# Patient Record
Sex: Female | Born: 1955 | Race: White | Hispanic: No | Smoking: Never smoker
Health system: Southern US, Community
[De-identification: ages and names within clinical notes are randomized; demographics above are authoritative.]

## PROBLEM LIST (undated history)

## (undated) DIAGNOSIS — G4700 Insomnia, unspecified: Secondary | ICD-10-CM

## (undated) DIAGNOSIS — R350 Frequency of micturition: Secondary | ICD-10-CM

## (undated) DIAGNOSIS — D649 Anemia, unspecified: Secondary | ICD-10-CM

## (undated) DIAGNOSIS — G894 Chronic pain syndrome: Secondary | ICD-10-CM

## (undated) DIAGNOSIS — R531 Weakness: Secondary | ICD-10-CM

## (undated) DIAGNOSIS — F419 Anxiety disorder, unspecified: Secondary | ICD-10-CM

## (undated) DIAGNOSIS — R3915 Urgency of urination: Secondary | ICD-10-CM

## (undated) DIAGNOSIS — M25569 Pain in unspecified knee: Secondary | ICD-10-CM

## (undated) DIAGNOSIS — IMO0001 Reserved for inherently not codable concepts without codable children: Secondary | ICD-10-CM

## (undated) DIAGNOSIS — G2581 Restless legs syndrome: Secondary | ICD-10-CM

## (undated) DIAGNOSIS — M1990 Unspecified osteoarthritis, unspecified site: Secondary | ICD-10-CM

## (undated) DIAGNOSIS — G4730 Sleep apnea, unspecified: Secondary | ICD-10-CM

## (undated) DIAGNOSIS — K219 Gastro-esophageal reflux disease without esophagitis: Secondary | ICD-10-CM

## (undated) DIAGNOSIS — R351 Nocturia: Secondary | ICD-10-CM

## (undated) DIAGNOSIS — Z8719 Personal history of other diseases of the digestive system: Secondary | ICD-10-CM

## (undated) DIAGNOSIS — M2550 Pain in unspecified joint: Secondary | ICD-10-CM

## (undated) DIAGNOSIS — T7840XA Allergy, unspecified, initial encounter: Secondary | ICD-10-CM

## (undated) DIAGNOSIS — M797 Fibromyalgia: Secondary | ICD-10-CM

## (undated) DIAGNOSIS — F410 Panic disorder [episodic paroxysmal anxiety] without agoraphobia: Secondary | ICD-10-CM

## (undated) DIAGNOSIS — I10 Essential (primary) hypertension: Secondary | ICD-10-CM

## (undated) DIAGNOSIS — Z8669 Personal history of other diseases of the nervous system and sense organs: Secondary | ICD-10-CM

## (undated) DIAGNOSIS — F329 Major depressive disorder, single episode, unspecified: Secondary | ICD-10-CM

## (undated) DIAGNOSIS — G8929 Other chronic pain: Secondary | ICD-10-CM

## (undated) DIAGNOSIS — M549 Dorsalgia, unspecified: Secondary | ICD-10-CM

## (undated) DIAGNOSIS — F32A Depression, unspecified: Secondary | ICD-10-CM

## (undated) HISTORY — PX: OTHER SURGICAL HISTORY: SHX169

## (undated) HISTORY — DX: Major depressive disorder, single episode, unspecified: F32.9

## (undated) HISTORY — DX: Depression, unspecified: F32.A

## (undated) HISTORY — DX: Anxiety disorder, unspecified: F41.9

## (undated) HISTORY — DX: Pain in unspecified knee: M25.569

## (undated) HISTORY — DX: Panic disorder (episodic paroxysmal anxiety): F41.0

## (undated) HISTORY — DX: Gastro-esophageal reflux disease without esophagitis: K21.9

## (undated) HISTORY — DX: Essential (primary) hypertension: I10

## (undated) HISTORY — DX: Unspecified osteoarthritis, unspecified site: M19.90

## (undated) HISTORY — PX: ABDOMINAL HYSTERECTOMY: SHX81

## (undated) HISTORY — PX: COLONOSCOPY: SHX174

## (undated) HISTORY — DX: Fibromyalgia: M79.7

## (undated) HISTORY — DX: Chronic pain syndrome: G89.4

## (undated) HISTORY — PX: CHOLECYSTECTOMY: SHX55

## (undated) HISTORY — PX: REFRACTIVE SURGERY: SHX103

## (undated) HISTORY — DX: Allergy, unspecified, initial encounter: T78.40XA

## (undated) HISTORY — DX: Frequency of micturition: R35.0

---

## 2010-02-04 LAB — HM PAP SMEAR

## 2010-10-18 ENCOUNTER — Encounter: Payer: Self-pay | Admitting: Internal Medicine

## 2010-10-18 ENCOUNTER — Ambulatory Visit (INDEPENDENT_AMBULATORY_CARE_PROVIDER_SITE_OTHER): Payer: Self-pay | Admitting: Internal Medicine

## 2010-10-18 VITALS — BP 112/70 | HR 94 | Temp 98.4°F | Ht 63.0 in | Wt 312.0 lb

## 2010-10-18 DIAGNOSIS — L03311 Cellulitis of abdominal wall: Secondary | ICD-10-CM

## 2010-10-18 DIAGNOSIS — L03319 Cellulitis of trunk, unspecified: Secondary | ICD-10-CM

## 2010-10-18 MED ORDER — AMOXICILLIN-POT CLAVULANATE 875-125 MG PO TABS: 1.0000 | ORAL_TABLET | Freq: Two times a day (BID) | ORAL | Status: AC

## 2010-10-18 MED ORDER — ZOLPIDEM TARTRATE 10 MG PO TABS: 10.0000 mg | ORAL_TABLET | Freq: Every day | ORAL | Status: DC

## 2010-10-18 NOTE — Progress Notes (Signed)
  Subjective:    Patient ID: Jasmine Huff, female    DOB: [birth year], 55 y.o.   MRN: [id number]  HPI  55 year old patient who will be establishing with this practice in 4 days. She was seen today as a acute add-on due to worsening  abdominal pain. She has a complex medical history and underwent a hysterectomy on December 11 which was complicated by a wound infection. This required a wound VAC for several weeks and finally healed approximately 2 weeks ago. For the past few days she has had low-grade fever and a general sense of unwellness. Yesterday she developed a red lower abdominal wall rash with increasing tenderness. She was seen today as a work in for possible cellulitis. Medical problems include chronic pain syndrome. She is on Percocet and is scheduled to see pain management in 3 days. She requests a refill on her Ambien    Review of Systems  Constitutional: Positive for fever and fatigue.  Skin: Positive for rash and wound.       Objective:   Physical Exam  Constitutional: She appears well-developed and well-nourished. No distress.       Obese no acute distress but anxious. Temperature 98.4.  Abdominal: Soft. Bowel sounds are normal.       There is a lower transverse surgical scar in the suprapubic area. The wound is well approximated. In the mid lower abdominal area there is a large area of erythema but no induration;  the area was warm to touch and slightly tender          Assessment & Plan:   Cellulitis lower abdominal wall. Status post wound infection. Will treat with Augmentin and followup in 4 days. She will report any worsening fever or chills or pain

## 2010-10-18 NOTE — Patient Instructions (Signed)
Call if h you develops any worsening pain fever or chills  Take your antibiotic as prescribed until ALL of it is gone, but stop if you develop a rash, swelling, or any side effects of the medication.  Contact our office as soon as possible if  there are side effects of the medication.  Followup in 4 days as scheduled

## 2010-10-22 ENCOUNTER — Encounter: Payer: Self-pay | Admitting: Internal Medicine

## 2010-10-22 ENCOUNTER — Ambulatory Visit (INDEPENDENT_AMBULATORY_CARE_PROVIDER_SITE_OTHER): Payer: Self-pay | Admitting: Internal Medicine

## 2010-10-22 DIAGNOSIS — E785 Hyperlipidemia, unspecified: Secondary | ICD-10-CM

## 2010-10-22 DIAGNOSIS — R350 Frequency of micturition: Secondary | ICD-10-CM

## 2010-10-22 DIAGNOSIS — G4700 Insomnia, unspecified: Secondary | ICD-10-CM

## 2010-10-22 DIAGNOSIS — G8929 Other chronic pain: Secondary | ICD-10-CM

## 2010-10-22 DIAGNOSIS — I10 Essential (primary) hypertension: Secondary | ICD-10-CM

## 2010-10-22 DIAGNOSIS — F329 Major depressive disorder, single episode, unspecified: Secondary | ICD-10-CM

## 2010-10-22 DIAGNOSIS — L0390 Cellulitis, unspecified: Secondary | ICD-10-CM

## 2010-10-22 DIAGNOSIS — IMO0001 Reserved for inherently not codable concepts without codable children: Secondary | ICD-10-CM

## 2010-10-22 DIAGNOSIS — M797 Fibromyalgia: Secondary | ICD-10-CM

## 2010-10-22 DIAGNOSIS — L0291 Cutaneous abscess, unspecified: Secondary | ICD-10-CM

## 2010-10-22 DIAGNOSIS — E669 Obesity, unspecified: Secondary | ICD-10-CM

## 2010-10-22 MED ORDER — OXYBUTYNIN CHLORIDE 5 MG PO TABS: 5.0000 mg | ORAL_TABLET | Freq: Three times a day (TID) | ORAL | Status: DC

## 2010-10-22 MED ORDER — MELOXICAM 7.5 MG PO TABS: 7.5000 mg | ORAL_TABLET | Freq: Two times a day (BID) | ORAL | Status: DC | PRN

## 2010-10-23 ENCOUNTER — Encounter: Payer: Self-pay | Admitting: Internal Medicine

## 2010-10-23 DIAGNOSIS — E785 Hyperlipidemia, unspecified: Secondary | ICD-10-CM | POA: Insufficient documentation

## 2010-10-23 DIAGNOSIS — L0390 Cellulitis, unspecified: Secondary | ICD-10-CM | POA: Insufficient documentation

## 2010-10-23 DIAGNOSIS — E669 Obesity, unspecified: Secondary | ICD-10-CM | POA: Insufficient documentation

## 2010-10-23 DIAGNOSIS — I10 Essential (primary) hypertension: Secondary | ICD-10-CM | POA: Insufficient documentation

## 2010-10-23 DIAGNOSIS — F329 Major depressive disorder, single episode, unspecified: Secondary | ICD-10-CM | POA: Insufficient documentation

## 2010-10-23 DIAGNOSIS — G4700 Insomnia, unspecified: Secondary | ICD-10-CM | POA: Insufficient documentation

## 2010-10-23 DIAGNOSIS — G8929 Other chronic pain: Secondary | ICD-10-CM | POA: Insufficient documentation

## 2010-10-23 DIAGNOSIS — F32A Depression, unspecified: Secondary | ICD-10-CM | POA: Insufficient documentation

## 2010-10-23 DIAGNOSIS — M797 Fibromyalgia: Secondary | ICD-10-CM | POA: Insufficient documentation

## 2010-10-23 DIAGNOSIS — R350 Frequency of micturition: Secondary | ICD-10-CM | POA: Insufficient documentation

## 2010-10-23 NOTE — Assessment & Plan Note (Signed)
Stable with good control. Consider dc of diuretic in future

## 2010-10-23 NOTE — Assessment & Plan Note (Signed)
Resolving. Continue abx to completion

## 2010-10-23 NOTE — Progress Notes (Signed)
  Subjective:    Patient ID: Jasmine Huff, female    DOB: [birth year], 55 y.o.   MRN: [id number]  HPI Pt presents to clinic to est primary care and for f/u of abdominal cellulitis. S/p hysterectomy complication by wound infection and requiring wound vac. Recently developed possible cellulitis with associated redness above wound on abdomen. Treated with augmentin with resolution of erythema. Denies f/c and is tolerating abx without abd pain/cramping or diarrhea. Does c/o chronic urinary frequency approx q2 hours for several months. Denies hematuria, dysuria or other associated complaint. Has not attempted medication for this problem. Did see urology remotely without recommendation for further testing per pt. BP reviewed as nl with h/o HTN. Has chronic pain followed by pain management and states oxycodone recently increased to 10mg  without adverse effect. Has battled obesity for years and is strongly considering lap band surgery in the future. Has difficulty exercising with chronic knee pain.   Reviewed pmh, medications and allergies    Review of Systems see hpi     Objective:   Physical Exam  Nursing note and vitals reviewed. Constitutional: She appears well-developed and well-nourished. No distress.  HENT:  Head: Normocephalic and atraumatic.  Right Ear: External ear normal.  Left Ear: External ear normal.  Mouth/Throat: Oropharynx is clear and moist.  Eyes: Conjunctivae are normal. No scleral icterus.  Neck: Neck supple.  Cardiovascular: Normal rate, regular rhythm and normal heart sounds.  Exam reveals no gallop and no friction rub.   No murmur heard. Pulmonary/Chest: Effort normal and breath sounds normal. No respiratory distress. She has no wheezes. She has no rales.  Lymphadenopathy:    She has no cervical adenopathy.  Neurological: She is alert.  Skin: Skin is warm and dry. No rash noted. She is not diaphoretic. No erythema.          Assessment & Plan:

## 2010-10-23 NOTE — Assessment & Plan Note (Signed)
Attempt ditropan. Consider dc of diuretic if bp remains under good control.

## 2011-01-02 ENCOUNTER — Telehealth: Payer: Self-pay | Admitting: Internal Medicine

## 2011-01-02 NOTE — Telephone Encounter (Signed)
Can increase ditropan to 5mg  po qid. Max dose. Also if bp is still nl can change zestoretic to plain prinivil 20mg  po qd. Urology if sx's can't be controlled.

## 2011-01-02 NOTE — Telephone Encounter (Signed)
Pt aware and would like to try which ever med Dr. Rexene Edison feels is best. Pt will pick up samples.

## 2011-01-02 NOTE — Telephone Encounter (Signed)
She gave me impression cost was concern so we used ditropan. Can change to vesicare 10mg  qd or enablex 7.5mg  po qd. ?samples. Again...may need urology if doesn't improve.

## 2011-01-02 NOTE — Telephone Encounter (Signed)
Pt would like to change zestoretic to plain lisinopril,  Pt would also like to know if there is a different med to try instead of the Oxybutin. She is very emotional/crying because she states that she can't get any sleep at night due to having to get up to urinate every 15 mins. Please advise

## 2011-01-02 NOTE — Telephone Encounter (Signed)
Whichever one you have more of

## 2011-01-02 NOTE — Telephone Encounter (Signed)
Pt called and said that Dr Rodena Medin had prescribed a med, Oxybutin 5 mg, for excessive urination. Pt said that med is not strong enough. Pt having to urinate every 15 mins. Can not rest. Pt does have prescription insurance now, so she is req a stronger med to be called in to [facility]  In  [place] [phone]. Pt is req that this new med be called in today, so she can sleep tonight.

## 2011-01-03 NOTE — Telephone Encounter (Signed)
Pt notified samples ready to pick up. Vesicare 10 mg po daily

## 2011-01-21 ENCOUNTER — Encounter: Payer: Self-pay | Admitting: Family Medicine

## 2011-01-21 ENCOUNTER — Ambulatory Visit (INDEPENDENT_AMBULATORY_CARE_PROVIDER_SITE_OTHER): Payer: PRIVATE HEALTH INSURANCE | Admitting: Family Medicine

## 2011-01-21 DIAGNOSIS — R350 Frequency of micturition: Secondary | ICD-10-CM

## 2011-01-21 DIAGNOSIS — R5381 Other malaise: Secondary | ICD-10-CM

## 2011-01-21 DIAGNOSIS — I10 Essential (primary) hypertension: Secondary | ICD-10-CM

## 2011-01-21 DIAGNOSIS — E669 Obesity, unspecified: Secondary | ICD-10-CM

## 2011-01-21 DIAGNOSIS — IMO0001 Reserved for inherently not codable concepts without codable children: Secondary | ICD-10-CM

## 2011-01-21 DIAGNOSIS — R739 Hyperglycemia, unspecified: Secondary | ICD-10-CM | POA: Insufficient documentation

## 2011-01-21 DIAGNOSIS — M797 Fibromyalgia: Secondary | ICD-10-CM

## 2011-01-21 DIAGNOSIS — R7309 Other abnormal glucose: Secondary | ICD-10-CM

## 2011-01-21 DIAGNOSIS — R5383 Other fatigue: Secondary | ICD-10-CM

## 2011-01-21 DIAGNOSIS — K219 Gastro-esophageal reflux disease without esophagitis: Secondary | ICD-10-CM

## 2011-01-21 HISTORY — DX: Gastro-esophageal reflux disease without esophagitis: K21.9

## 2011-01-21 LAB — CBC WITH DIFFERENTIAL/PLATELET
Eosinophils Absolute: 0 10*3/uL (ref 0.0–0.7)
MCHC: 32.5 g/dL (ref 30.0–36.0)
MCV: 86.4 fl (ref 78.0–100.0)
Monocytes Absolute: 0.7 10*3/uL (ref 0.1–1.0)
Neutrophils Relative %: 76.7 % (ref 43.0–77.0)
Platelets: 328 10*3/uL (ref 150.0–400.0)
RDW: 15.9 % — ABNORMAL HIGH (ref 11.5–14.6)

## 2011-01-21 LAB — BASIC METABOLIC PANEL
BUN: 14 mg/dL (ref 6–23)
Calcium: 8.8 mg/dL (ref 8.4–10.5)
Creatinine, Ser: 1 mg/dL (ref 0.4–1.2)
GFR: 59.16 mL/min — ABNORMAL LOW (ref >60.00)
Glucose, Bld: 107 mg/dL — ABNORMAL HIGH (ref 70–99)
Sodium: 139 mEq/L (ref 135–145)

## 2011-01-21 MED ORDER — OMEPRAZOLE 40 MG PO CPDR: 40.0000 mg | DELAYED_RELEASE_CAPSULE | Freq: Every day | ORAL | Status: DC

## 2011-01-21 NOTE — Progress Notes (Signed)
  Subjective:    Patient ID: Jasmine Huff, female    DOB: 28-Apr-1956, 55 y.o.   MRN: [id number]  HPI Patient transferring care to me. She has history of morbid obesity, hypertension, fibromyalgia with chronic pain syndrome, urine frequency and urgency, chronic insomnia and chronic depression. She sees pain management specialist also see a psychiatrist regularly. She had hysterectomy last winter with complications of abdominal wall cellulitis. That has resolved.  Major issues right now are urine frequency. This is greatly impairing sleep. Gets up every 2 hours. Recent trial of oxybutynin and VESIcare 10 mg daily without much improvement. The VESIcare for one month. Elevated glucose fasting 120-160 with no diagnosis of diabetes. Does have family history.  Consistent weight gain over several years. Poorly compliant with diet. Does drink some milk at night and also takes diuretic with lisinopril HCTZ for hypertension but takes this in the morning. Urine frequency problems mostly at night. No burning with urination.  Chronic pain related to fibromyalgia and osteoarthritis of knees. She takes oxycodone for pain management.  History of GERD. Controlled with Prilosec and requesting refills.   Review of Systems  Constitutional: Positive for fatigue. Negative for fever and chills.  Eyes: Negative for visual disturbance.  Cardiovascular: Negative for chest pain and leg swelling.  Gastrointestinal: Negative for abdominal pain.  Genitourinary: Positive for frequency. Negative for hematuria.  Musculoskeletal: Positive for myalgias, back pain and arthralgias. Negative for joint swelling.  Psychiatric/Behavioral: Positive for dysphoric mood. Negative for suicidal ideas and confusion.       Objective:   Physical Exam  Constitutional: She is oriented to person, place, and time.       Pleasant morbidly obese female in no distress  HENT:  Mouth/Throat: Oropharynx is clear and moist.  Neck: Neck supple.    Cardiovascular: Normal rate and regular rhythm.   Pulmonary/Chest: Effort normal and breath sounds normal. No respiratory distress. She has no wheezes. She has no rales.  Musculoskeletal: She exhibits no edema.  Lymphadenopathy:    She has no cervical adenopathy.  Neurological: She is alert and oriented to person, place, and time.          Assessment & Plan:  #1 morbid obesity. Discussed possible nutrition referral which she does have interest in #2 chronic fatigue probably related to poor sleep and weight issues and lack of activity. Check labs with CBC TSH and basic metabolic panel  #3 history of hyperglycemia by fasting blood sugars at home. Check A1c and consider treatment with metformin if elevated #4 chronic urine frequency. Probably exacerbated by morbid obesity. Poor response to VESIcare.   Discussed limiting milk use at night.  She is on mild diuretic but takes each morning. Consider urology referral

## 2011-01-21 NOTE — Patient Instructions (Signed)
No milk consumption at night. Work on weight loss.

## 2011-01-22 ENCOUNTER — Ambulatory Visit: Payer: Self-pay | Admitting: Internal Medicine

## 2011-01-23 ENCOUNTER — Telehealth: Payer: Self-pay | Admitting: *Deleted

## 2011-01-23 MED ORDER — METFORMIN HCL ER 500 MG PO TB24: 500.0000 mg | ORAL_TABLET | Freq: Every day | ORAL | Status: DC

## 2011-01-23 NOTE — Progress Notes (Signed)
Quick Note:  Pt informed, she voiced her understanding. Will order med ______

## 2011-01-23 NOTE — Telephone Encounter (Signed)
New med sent to pharmacy, pt aware, labs sent to pt home as requested

## 2011-01-24 ENCOUNTER — Telehealth: Payer: Self-pay | Admitting: Family Medicine

## 2011-01-24 MED ORDER — ZOLPIDEM TARTRATE 10 MG PO TABS: 10.0000 mg | ORAL_TABLET | Freq: Every day | ORAL | Status: DC

## 2011-01-24 MED ORDER — SOLIFENACIN SUCCINATE 5 MG PO TABS: 5.0000 mg | ORAL_TABLET | Freq: Every day | ORAL | Status: DC

## 2011-01-24 NOTE — Telephone Encounter (Signed)
May refill both for 3 months. 

## 2011-01-24 NOTE — Telephone Encounter (Signed)
Needs new rx for vesicare 10 mg (was on samples) and Ambien 10 mg. Send to [facility] in [place]---ph---[phone].

## 2011-01-24 NOTE — Telephone Encounter (Signed)
ambien last filled #30 with 1 refill on 10-18-10 Please advise

## 2011-02-03 ENCOUNTER — Telehealth: Payer: Self-pay | Admitting: Family Medicine

## 2011-02-03 NOTE — Telephone Encounter (Signed)
Pt called and said that the 5 mg Vesicare is not strong enough. Pt req that this be increased to the  solifenacin (VESICARE) 10mg  to [facility] in [place]. Pt was taking the 10 mg as samples and it works well.

## 2011-02-03 NOTE — Telephone Encounter (Signed)
OK to try and increase to Vesicare 10 mg daily.  Need to make sure pt has follow up in one month to reassess.

## 2011-02-04 ENCOUNTER — Ambulatory Visit: Payer: PRIVATE HEALTH INSURANCE | Admitting: Family Medicine

## 2011-02-04 MED ORDER — SOLIFENACIN SUCCINATE 10 MG PO TABS: 5.0000 mg | ORAL_TABLET | Freq: Every day | ORAL | Status: DC

## 2011-02-04 NOTE — Telephone Encounter (Signed)
Spoke with patient.

## 2011-02-19 ENCOUNTER — Ambulatory Visit (INDEPENDENT_AMBULATORY_CARE_PROVIDER_SITE_OTHER): Payer: PRIVATE HEALTH INSURANCE | Admitting: Family Medicine

## 2011-02-19 ENCOUNTER — Encounter: Payer: Self-pay | Admitting: Family Medicine

## 2011-02-19 DIAGNOSIS — E119 Type 2 diabetes mellitus without complications: Secondary | ICD-10-CM

## 2011-02-19 DIAGNOSIS — R350 Frequency of micturition: Secondary | ICD-10-CM

## 2011-02-19 DIAGNOSIS — R7309 Other abnormal glucose: Secondary | ICD-10-CM

## 2011-02-19 DIAGNOSIS — E669 Obesity, unspecified: Secondary | ICD-10-CM

## 2011-02-19 DIAGNOSIS — R739 Hyperglycemia, unspecified: Secondary | ICD-10-CM

## 2011-02-19 NOTE — Progress Notes (Signed)
  Subjective:    Patient ID: Jasmine Huff, female    DOB: Nov 14, 1955, 55 y.o.   MRN: [id number]  HPI Morbid obesity. She has fibromyalgia and has comorbidities including hypertension, type 2 diabetes, and GERD. Also has history of chronic depression. Recently initiated metformin 500 mg daily. Blood sugars averaging around 121 to 135 fasting. No side effects from metformin. Recent A1c 6.8%. She's lost almost 28 pounds last month due to dietary modification. She is eating 3 regular meals per day. Never got nutrition referral.  States she hurts too much in joints to exercise.  She has urinary urgency which has not improved much with VESIcare. Occasional stress incontinence. We suspect some of this is related to her morbid obesity. Reflux relatively well controlled. She has some ongoing fatigue issues. Unable to do much exercise this time. She has strong interest in considering gastric bypass surgery.   Review of Systems  Constitutional: Positive for fatigue. Negative for fever, chills and appetite change.  HENT: Negative for trouble swallowing.   Respiratory: Negative for cough.   Cardiovascular: Negative for chest pain and palpitations.  Gastrointestinal: Negative for abdominal pain.  Genitourinary: Positive for urgency and frequency. Negative for dysuria and hematuria.  Musculoskeletal: Positive for myalgias and back pain. Negative for joint swelling.  Neurological: Negative for dizziness.  Hematological: Negative for adenopathy.       Objective:   Physical Exam  Constitutional: She is oriented to person, place, and time. She appears well-developed and well-nourished.  Neck: Neck supple. No thyromegaly present.  Cardiovascular: Normal rate and regular rhythm.   Pulmonary/Chest: Effort normal and breath sounds normal. No respiratory distress. She has no wheezes. She has no rales.  Musculoskeletal: She exhibits no edema.  Neurological: She is alert and oriented to person, place, and time.    Psychiatric: She has a normal mood and affect. Her behavior is normal.          Assessment & Plan:  #1 morbid obesity. Long discussion with patient regarding strategies for weight loss. She is often a great start. Nutrition referral. She is interested in attending classes on gastric bypass #2 type 2 diabetes. Recent A1c 6.8%. Suspect this will improve further with weight loss and will recheck in 3 months #3 urinary urgency/stress incontinence not much improved with VESIcare. Again, suspect this may improve with weight loss #4 hypertension stable #5 depression stable #6 fibromyalgia

## 2011-02-26 ENCOUNTER — Telehealth: Payer: Self-pay

## 2011-02-26 MED ORDER — TOLTERODINE TARTRATE ER 4 MG PO CP24: 4.0000 mg | ORAL_CAPSULE | Freq: Every day | ORAL | Status: DC

## 2011-02-26 NOTE — Telephone Encounter (Signed)
Pt called c/o vesicare not working. States that she is getting up every hour to use the restroom and is extremely tired and sleepy because of it. She would like to at least try the Detrol. Please advise

## 2011-02-26 NOTE — Telephone Encounter (Signed)
Stop Vesicare.  Start Detrol LA 4 mg po qhs.

## 2011-02-26 NOTE — Telephone Encounter (Signed)
Pt aware.

## 2011-02-27 ENCOUNTER — Telehealth: Payer: Self-pay | Admitting: Family Medicine

## 2011-02-27 NOTE — Telephone Encounter (Signed)
Pt informed

## 2011-02-27 NOTE — Telephone Encounter (Signed)
[facility] is only group in town.

## 2011-02-27 NOTE — Telephone Encounter (Signed)
Pt called 8/23. States Dr. B advised her to see a urologist. She would like a call with a couple of urologist recommendations from Dr. Leonard Schwartz.

## 2011-03-03 ENCOUNTER — Other Ambulatory Visit: Payer: Self-pay | Admitting: Family Medicine

## 2011-03-03 DIAGNOSIS — Z1231 Encounter for screening mammogram for malignant neoplasm of breast: Secondary | ICD-10-CM

## 2011-03-05 ENCOUNTER — Ambulatory Visit
Admission: RE | Admit: 2011-03-05 | Discharge: 2011-03-05 | Disposition: A | Discharge status: Home / Self Care | Payer: PRIVATE HEALTH INSURANCE | Source: Ambulatory Visit | Attending: Family Medicine | Admitting: Family Medicine

## 2011-03-05 DIAGNOSIS — Z1231 Encounter for screening mammogram for malignant neoplasm of breast: Secondary | ICD-10-CM

## 2011-03-17 ENCOUNTER — Telehealth: Payer: Self-pay | Admitting: Family Medicine

## 2011-03-17 NOTE — Telephone Encounter (Signed)
We need previous labs from prior physician confirming positive RA latex and notes from pain management clinic

## 2011-03-17 NOTE — Telephone Encounter (Signed)
Please advise 

## 2011-03-17 NOTE — Telephone Encounter (Signed)
Pt was told by Pain Clinic that she needs to get referral for pcp to Rheumatologist re: severe pain in rt wrist, elbow and shoulder. Pt has tested positive for RA in the past by rheumatolgist in [place].

## 2011-03-18 NOTE — Telephone Encounter (Signed)
Release of medical records mailed to pt home upon her request (lives in [place]).  She will fill out and return or fax to Pain Management office to release records to [place]

## 2011-03-20 ENCOUNTER — Ambulatory Visit: Payer: Medicare Other | Attending: Physical Medicine and Rehabilitation | Admitting: Physical Therapy

## 2011-03-20 DIAGNOSIS — M25619 Stiffness of unspecified shoulder, not elsewhere classified: Secondary | ICD-10-CM | POA: Insufficient documentation

## 2011-03-20 DIAGNOSIS — IMO0001 Reserved for inherently not codable concepts without codable children: Secondary | ICD-10-CM | POA: Insufficient documentation

## 2011-03-20 DIAGNOSIS — R5381 Other malaise: Secondary | ICD-10-CM | POA: Insufficient documentation

## 2011-03-20 DIAGNOSIS — M25519 Pain in unspecified shoulder: Secondary | ICD-10-CM | POA: Insufficient documentation

## 2011-03-24 ENCOUNTER — Ambulatory Visit: Payer: Medicare Other | Admitting: Physical Therapy

## 2011-03-26 ENCOUNTER — Other Ambulatory Visit: Payer: Self-pay | Admitting: *Deleted

## 2011-03-26 MED ORDER — LISINOPRIL-HYDROCHLOROTHIAZIDE 20-12.5 MG PO TABS: 1.0000 | ORAL_TABLET | Freq: Every day | ORAL | Status: DC

## 2011-03-27 ENCOUNTER — Ambulatory Visit: Payer: Medicare Other | Admitting: *Deleted

## 2011-03-31 ENCOUNTER — Ambulatory Visit: Payer: Medicare Other | Admitting: Physical Therapy

## 2011-04-03 ENCOUNTER — Ambulatory Visit: Payer: Medicare Other | Admitting: Physical Therapy

## 2011-04-07 ENCOUNTER — Ambulatory Visit: Payer: Medicare Other | Attending: Physical Medicine and Rehabilitation | Admitting: Physical Therapy

## 2011-04-07 DIAGNOSIS — M25519 Pain in unspecified shoulder: Secondary | ICD-10-CM | POA: Insufficient documentation

## 2011-04-07 DIAGNOSIS — R5381 Other malaise: Secondary | ICD-10-CM | POA: Insufficient documentation

## 2011-04-07 DIAGNOSIS — IMO0001 Reserved for inherently not codable concepts without codable children: Secondary | ICD-10-CM | POA: Insufficient documentation

## 2011-04-07 DIAGNOSIS — M25619 Stiffness of unspecified shoulder, not elsewhere classified: Secondary | ICD-10-CM | POA: Insufficient documentation

## 2011-04-10 ENCOUNTER — Ambulatory Visit: Payer: Medicare Other | Admitting: Physical Therapy

## 2011-04-14 ENCOUNTER — Ambulatory Visit: Payer: Medicare Other | Admitting: Physical Therapy

## 2011-04-17 ENCOUNTER — Encounter: Payer: Medicare Other | Admitting: Physical Therapy

## 2011-04-18 ENCOUNTER — Other Ambulatory Visit: Payer: Self-pay | Admitting: Family Medicine

## 2011-04-18 MED ORDER — MELOXICAM 7.5 MG PO TABS: 7.5000 mg | ORAL_TABLET | Freq: Two times a day (BID) | ORAL | Status: DC | PRN

## 2011-04-18 NOTE — Telephone Encounter (Signed)
Pt also need meloxicam 7.5 mg

## 2011-04-18 NOTE — Telephone Encounter (Signed)
Pt called back to check on status of meloxicam (MOBIC) 7.5 MG tablet. Pls call in to [facility] # [phone]. Pt wants to be sure that this is called in today.

## 2011-04-18 NOTE — Telephone Encounter (Signed)
Rx sent 

## 2011-04-18 NOTE — Telephone Encounter (Signed)
Ok to refill meloxicam 

## 2011-04-21 ENCOUNTER — Ambulatory Visit: Payer: Medicare Other | Admitting: Physical Therapy

## 2011-04-24 ENCOUNTER — Other Ambulatory Visit: Payer: Self-pay | Admitting: Family Medicine

## 2011-04-24 ENCOUNTER — Ambulatory Visit: Payer: Medicare Other | Admitting: Physical Therapy

## 2011-04-24 NOTE — Telephone Encounter (Signed)
Ambien last filled 01-24-11, #30 with 2 refills

## 2011-04-24 NOTE — Telephone Encounter (Signed)
Refill for 3 months OK. 

## 2011-04-28 ENCOUNTER — Ambulatory Visit: Payer: Medicare Other | Admitting: Physical Therapy

## 2011-05-01 ENCOUNTER — Ambulatory Visit: Payer: Medicare Other | Admitting: Physical Therapy

## 2011-05-05 ENCOUNTER — Ambulatory Visit: Payer: Medicare Other | Admitting: Physical Therapy

## 2011-05-08 ENCOUNTER — Encounter: Payer: Medicare Other | Admitting: Physical Therapy

## 2011-05-12 ENCOUNTER — Ambulatory Visit: Payer: Medicare Other | Attending: Physical Medicine and Rehabilitation | Admitting: Physical Therapy

## 2011-05-12 DIAGNOSIS — R5381 Other malaise: Secondary | ICD-10-CM | POA: Insufficient documentation

## 2011-05-12 DIAGNOSIS — M25619 Stiffness of unspecified shoulder, not elsewhere classified: Secondary | ICD-10-CM | POA: Insufficient documentation

## 2011-05-12 DIAGNOSIS — IMO0001 Reserved for inherently not codable concepts without codable children: Secondary | ICD-10-CM | POA: Insufficient documentation

## 2011-05-12 DIAGNOSIS — M25519 Pain in unspecified shoulder: Secondary | ICD-10-CM | POA: Insufficient documentation

## 2011-05-13 ENCOUNTER — Ambulatory Visit: Payer: PRIVATE HEALTH INSURANCE | Admitting: Family Medicine

## 2011-05-15 ENCOUNTER — Ambulatory Visit: Payer: Medicare Other | Admitting: Physical Therapy

## 2011-05-20 ENCOUNTER — Ambulatory Visit: Payer: Medicare Other | Admitting: Physical Therapy

## 2011-05-21 ENCOUNTER — Ambulatory Visit: Payer: PRIVATE HEALTH INSURANCE | Admitting: Family Medicine

## 2011-05-22 ENCOUNTER — Encounter: Payer: Medicare Other | Admitting: Physical Therapy

## 2011-05-26 ENCOUNTER — Ambulatory Visit: Payer: Medicare Other | Admitting: Physical Therapy

## 2011-07-15 DIAGNOSIS — N3941 Urge incontinence: Secondary | ICD-10-CM | POA: Diagnosis not present

## 2011-07-15 DIAGNOSIS — R350 Frequency of micturition: Secondary | ICD-10-CM | POA: Diagnosis not present

## 2011-07-22 DIAGNOSIS — Z79899 Other long term (current) drug therapy: Secondary | ICD-10-CM | POA: Diagnosis not present

## 2011-07-22 DIAGNOSIS — R350 Frequency of micturition: Secondary | ICD-10-CM | POA: Diagnosis not present

## 2011-07-22 DIAGNOSIS — R5381 Other malaise: Secondary | ICD-10-CM | POA: Diagnosis not present

## 2011-07-22 DIAGNOSIS — N3941 Urge incontinence: Secondary | ICD-10-CM | POA: Diagnosis not present

## 2011-07-29 DIAGNOSIS — R350 Frequency of micturition: Secondary | ICD-10-CM | POA: Diagnosis not present

## 2011-07-29 DIAGNOSIS — N3941 Urge incontinence: Secondary | ICD-10-CM | POA: Diagnosis not present

## 2011-07-29 DIAGNOSIS — N3942 Incontinence without sensory awareness: Secondary | ICD-10-CM | POA: Diagnosis not present

## 2011-07-30 DIAGNOSIS — M1710 Unilateral primary osteoarthritis, unspecified knee: Secondary | ICD-10-CM | POA: Diagnosis not present

## 2011-07-30 DIAGNOSIS — IMO0001 Reserved for inherently not codable concepts without codable children: Secondary | ICD-10-CM | POA: Diagnosis not present

## 2011-07-30 DIAGNOSIS — M069 Rheumatoid arthritis, unspecified: Secondary | ICD-10-CM | POA: Diagnosis not present

## 2011-07-30 DIAGNOSIS — M7500 Adhesive capsulitis of unspecified shoulder: Secondary | ICD-10-CM | POA: Diagnosis not present

## 2011-08-05 DIAGNOSIS — N3941 Urge incontinence: Secondary | ICD-10-CM | POA: Diagnosis not present

## 2011-08-05 DIAGNOSIS — R350 Frequency of micturition: Secondary | ICD-10-CM | POA: Diagnosis not present

## 2011-08-12 DIAGNOSIS — N3941 Urge incontinence: Secondary | ICD-10-CM | POA: Diagnosis not present

## 2011-08-12 DIAGNOSIS — R350 Frequency of micturition: Secondary | ICD-10-CM | POA: Diagnosis not present

## 2011-08-19 DIAGNOSIS — N3941 Urge incontinence: Secondary | ICD-10-CM | POA: Diagnosis not present

## 2011-08-20 DIAGNOSIS — M7500 Adhesive capsulitis of unspecified shoulder: Secondary | ICD-10-CM | POA: Diagnosis not present

## 2011-08-20 DIAGNOSIS — IMO0001 Reserved for inherently not codable concepts without codable children: Secondary | ICD-10-CM | POA: Diagnosis not present

## 2011-08-20 DIAGNOSIS — M129 Arthropathy, unspecified: Secondary | ICD-10-CM | POA: Diagnosis not present

## 2011-08-20 DIAGNOSIS — M1710 Unilateral primary osteoarthritis, unspecified knee: Secondary | ICD-10-CM | POA: Diagnosis not present

## 2011-08-21 ENCOUNTER — Ambulatory Visit (INDEPENDENT_AMBULATORY_CARE_PROVIDER_SITE_OTHER): Payer: Medicare Other | Admitting: Licensed Clinical Social Worker

## 2011-08-21 ENCOUNTER — Encounter (HOSPITAL_COMMUNITY): Payer: Self-pay | Admitting: Licensed Clinical Social Worker

## 2011-08-21 DIAGNOSIS — F332 Major depressive disorder, recurrent severe without psychotic features: Secondary | ICD-10-CM | POA: Diagnosis not present
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
## 2011-08-26 DIAGNOSIS — N3941 Urge incontinence: Secondary | ICD-10-CM | POA: Diagnosis not present

## 2011-08-26 DIAGNOSIS — R350 Frequency of micturition: Secondary | ICD-10-CM | POA: Diagnosis not present

## 2011-08-29 ENCOUNTER — Ambulatory Visit (HOSPITAL_COMMUNITY): Payer: Self-pay | Admitting: Licensed Clinical Social Worker

## 2011-08-29 ENCOUNTER — Encounter (HOSPITAL_COMMUNITY): Payer: Self-pay

## 2011-09-01 ENCOUNTER — Telehealth (HOSPITAL_COMMUNITY): Payer: Self-pay | Admitting: *Deleted

## 2011-09-01 ENCOUNTER — Ambulatory Visit (INDEPENDENT_AMBULATORY_CARE_PROVIDER_SITE_OTHER): Payer: Medicare Other | Admitting: Psychiatry

## 2011-09-01 ENCOUNTER — Encounter (HOSPITAL_COMMUNITY): Payer: Self-pay | Admitting: Psychiatry

## 2011-09-01 VITALS — Wt 318.0 lb

## 2011-09-01 DIAGNOSIS — F339 Major depressive disorder, recurrent, unspecified: Secondary | ICD-10-CM

## 2011-09-01 DIAGNOSIS — F329 Major depressive disorder, single episode, unspecified: Secondary | ICD-10-CM

## 2011-09-01 MED ORDER — QUETIAPINE FUMARATE 50 MG PO TABS: 50.0000 mg | ORAL_TABLET | Freq: Every day | ORAL | Status: DC

## 2011-09-01 MED ORDER — ZIPRASIDONE HCL 40 MG PO CAPS: 40.0000 mg | ORAL_CAPSULE | Freq: Every day | ORAL | Status: DC

## 2011-09-01 NOTE — Progress Notes (Signed)
Chief complaint I want to establish my care in this office  History of present illness Patient is 56 year old Caucasian divorced unemployed female who came for her appointment. Patient endorsed long history of depression for almost 20 years. She was seeing psychiatrist in [place]. She had tried multiple antidepressant in the past. Currently she is taking Lexapro 20 mg daily which is helping her. She moved to [facility] almost a year ago to live close with her brother. Patient told she ran out of money and cannot support herself and decided to live close with her brother. Patient told she is taking Lexapro almost 2 years and that has makeup big improvement. She denies agitation, anger or mood swings but reported social isolation crying spells and poor sleep. She is taking Ambien 10 mg with little response. She also takes Klonopin 2 mg at bedtime and 1 mg during the day for anxiety and panic attack. She admitted multiple stressors including financial distress and multiple medical problems contributing to her depression. She also gained weight in past one year. She is wondering if any medication can be given to help her forsleep and residual depressive thoughts. She has taken Geodon in the past that helped her depression and weight loss but it is unclear why she stopped. She denies any mania or psychosis.  Past psychiatric history Patient told she has been depressed for almost 20 years. She had tried amitriptyline, nortriptyline, Remeron, Seroquel, Geodon, Abilify, Cymbalta, Prozac, Paxil, Zoloft, Wellbutrin, Effexor and lithium. She do not recall the effects of these medication but felt he today stopped working her having side effects. She denies any history of suicidal attempt or any inpatient psychiatric treatment. She also denied any history of mania or psychosis. She has been seeing Dr. Haskel Khan  In [place] for 20 years. She has not seen him in one year but getting prescription on the phone. She  has 7 refills on Lexapro.  Current psychiatric medication Lexapro 20 mg daily Klonopin 1 mg 1 in the morning and 2 at bedtime Neurontin 300 mg 2 at bedtime Ambien 10 mg daily   Family history Patient endorsed mother and father has history of depression  Psychosocial history Patient was born and raised in [place]. She married once but has no children. As mentioned above patient recently moved to [facility] to live close with her brother. Patient was very involved taking care of her mother who is now a nursing home in [place].  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  Medical history Patient has history of arthritis, hypertension, UTI, fibromyalgia, chronic pain syndrome, GERD, knee pain and diabetes mellitus.  Alcohol and substance use history Patient has a history of alcohol or substance use  Mental status examination Patient is morbid obese female who is casually dressed and well groomed. She maintained good eye contact. She appeared anxious but relevant. She denies any auditory or visual hallucination. Her speech is clear and soft. She described her mood is anxious and depressed and her affect is constricted. She denies any active or passive suicidal thinking and homicidal thinking. There were no psychotic symptoms present. She's alert and oriented x3. There were no tremors or shakes present. Her insight judgment and impulse control is okay.  Diagnosis Axis I Maj. depressive disorder Axis II deferred Axis III see medical history Axis IV other to moderate Axis V 55-60  Plan Medication history reviewed. At this time patient is  doing better on Lexapro however she continues to have insomnia and residual symptoms of depression. In the past she had tried Geodon which helped weight loss and insomnia but she is unclear why it was stopped. At this time the Ambien is not working very  well. She is also taking Klonopin 2 mg at bedtime. I recommended to try again Geodon 40 mg at bedtime which can help her antidepressant and insomnia. I explained risks and benefits of medication. She will continue to see therapist in this office for increase coping and social skills. I recommended to call us if she has any question or concern otherwise I will see her again in 3 weeks.

## 2011-09-01 NOTE — Telephone Encounter (Signed)
Called after morning appointment to report Geodon too expensive ($95) and requested alternative medication. Informed Dr. Lolly Mustache of this information. He ordered Seroquel 50mg , take one  at bedtime. Contacted patient and informed her of this. D/c'd Geodon for patient and entered Seroquel order as directed by Dr. Lolly Mustache.

## 2011-09-02 DIAGNOSIS — R350 Frequency of micturition: Secondary | ICD-10-CM | POA: Diagnosis not present

## 2011-09-02 DIAGNOSIS — N3941 Urge incontinence: Secondary | ICD-10-CM | POA: Diagnosis not present

## 2011-09-04 ENCOUNTER — Ambulatory Visit (INDEPENDENT_AMBULATORY_CARE_PROVIDER_SITE_OTHER): Payer: Medicare Other | Admitting: Licensed Clinical Social Worker

## 2011-09-04 DIAGNOSIS — F332 Major depressive disorder, recurrent severe without psychotic features: Secondary | ICD-10-CM | POA: Diagnosis not present

## 2011-09-04 NOTE — Progress Notes (Signed)
   THERAPIST PROGRESS NOTE  Session Time: 10:30am-11:20am  Participation Level: Active  Behavioral Response: Fairly GroomedAlertAnxious and Depressed  Type of Therapy: Individual Therapy  Treatment Goals addressed: Anxiety, Coping and Diagnosis: depression  Interventions: CBT, Motivational Interviewing and Strength-based  Summary: Jasmine Huff is a 56 y.o. female who presents with depressed mood and tearful affect. She reports a difficult two weeks since her last session and is tearful when describing her sadness. She is tearful throughout the entire session. She brought her journal in to show this therapist and to discuss how difficult jouranling is for her. She only wrote two days and wrote about her increased anxiety while writing because she does not like to feel. She endorses a pattern of binge eating her entire life to avoid her feelings. She hates her body, calls herself ugly and talks about how no man would want her. She has a difficult time with her feeling vocabulary. She is guilty about her mother being in a nursing home and that she cannot visit her as often as she wants or needs to. She feels traumatized by the experience of receiving a phone call from the nursing home altering her that her mother was dying and she has not been able to recover from this. Now, she is fearful and anxious any time they call. She is fearful that she is too dependent on others and feels vulernable. Her appetite is increased and her sleep has increased since she was prescribed Risperdal. She took one dose and was unable to wake up until 4pm the next day. She has not taken any further doses. Will relay this information to Dr. Lolly Mustache.   Suicidal/Homicidal: Nowithout intent/plan  Therapist Response: Processed w/patient her feelings of anxiety, depression and guilt. Explored her thought distortions and negative automatic self talk. Used CBT to assist patient with the identification of her negative distortions  and irrational thoughts. Encouraged patient to verbalize alternative and factual responses which challenge her distortions. Used DBT to help build distress tolerance to feelings. Recommend patient continue writing a daily journal entry to build tolerance and understanding of her feelings. Used motivational interviewing to assist and encourage patient through the change process. Explored patients barriers to change. Reviewed patients self care plan. Assessed her progress related to self care. Patient's self care is poor. Recommend proper diet, regular exercise, socialization and recreation.   Plan: Return again in one weeks.  Diagnosis: Axis I: Major Depression, Recurrent severe    Axis II: No diagnosis    Jahzion Brogden, LCSW 09/04/2011

## 2011-09-05 DIAGNOSIS — M25549 Pain in joints of unspecified hand: Secondary | ICD-10-CM | POA: Diagnosis not present

## 2011-09-05 DIAGNOSIS — IMO0001 Reserved for inherently not codable concepts without codable children: Secondary | ICD-10-CM | POA: Diagnosis not present

## 2011-09-05 DIAGNOSIS — M25519 Pain in unspecified shoulder: Secondary | ICD-10-CM | POA: Diagnosis not present

## 2011-09-05 DIAGNOSIS — M25579 Pain in unspecified ankle and joints of unspecified foot: Secondary | ICD-10-CM | POA: Diagnosis not present

## 2011-09-08 ENCOUNTER — Telehealth (HOSPITAL_COMMUNITY): Payer: Self-pay | Admitting: *Deleted

## 2011-09-08 NOTE — Telephone Encounter (Signed)
Called Friday 09/05/11. Msg on VM retrieved 09/08/11.Took one Seroquel 50mg  at bedtime. Slept until 4pm the next day. Too strong for her.Can't take this. Wonders about different medicine.

## 2011-09-09 ENCOUNTER — Other Ambulatory Visit (HOSPITAL_COMMUNITY): Payer: Self-pay | Admitting: Psychiatry

## 2011-09-09 DIAGNOSIS — N3941 Urge incontinence: Secondary | ICD-10-CM | POA: Diagnosis not present

## 2011-09-09 DIAGNOSIS — R350 Frequency of micturition: Secondary | ICD-10-CM | POA: Diagnosis not present

## 2011-09-10 ENCOUNTER — Telehealth (HOSPITAL_COMMUNITY): Payer: Self-pay | Admitting: *Deleted

## 2011-09-10 NOTE — Telephone Encounter (Signed)
Message copied by Tonny Bollman on Wed Sep 10, 2011  9:35 AM ------      Message from: Kathryne Sharper T      Created: Tue Sep 09, 2011  8:20 AM       She should try 1/2 Seroquel and do not take Klonopin at night. Geodon helps but she cannot afford it.

## 2011-09-10 NOTE — Telephone Encounter (Signed)
Contacted patient by phone 09/10/11 0937.Instructed patient to follow Dr.Arfeen's instructions below: Try taking 1/2 of Seroquel tablet -25 mg at bedtime and not to take Klonopin at bedtime. Patient verbalized understanding of instructions

## 2011-09-11 ENCOUNTER — Ambulatory Visit (INDEPENDENT_AMBULATORY_CARE_PROVIDER_SITE_OTHER): Payer: Medicare Other | Admitting: Licensed Clinical Social Worker

## 2011-09-11 DIAGNOSIS — F332 Major depressive disorder, recurrent severe without psychotic features: Secondary | ICD-10-CM | POA: Diagnosis not present

## 2011-09-11 NOTE — Progress Notes (Signed)
   THERAPIST PROGRESS NOTE  Session Time: 8:30am-9:20am  Participation Level: Active  Behavioral Response: Fairly GroomedAlertAnxious, Depressed, Hopeless and Irritable  Type of Therapy: Individual Therapy  Treatment Goals addressed: Anger, Anxiety, Coping and Diagnosis: depression  Interventions: CBT, DBT, Motivational Interviewing and Supportive  Summary: Jasmine Huff is a 56 y.o. female who presents with depressed mood and tearful affect. She reports that feeling depressed, anxious and unable to stop crying. She is tearful throughout the session today, frequently stating that she feels hopeless and does not believe that her depression is ever going to improve. She is angry with her brother for not providing her the emotional support she needs. She expects her brother to fill a large void in her and becomes angry when he does not meet her expectations. Her pain level is constantly high, preventing her from engaging in activities outside the home, cooking, cleaning and maintaining her own ADL's. She learned from her rheumatologist that she needs both shoulders and knees replaced and she is overwhelmed by this news, and doesn't know where to start. She wants to have lap band surgery done as well and is confused about which she should have done first. She wrote in her journal once, only a few sentences and finds this process difficult and does not want to continue it. Her sleep is poor due to not taking Seroquel at night and frequent urination. She is not binge eating lately, due to her depression and describes a very limited and poor diet.    Suicidal/Homicidal: Nowithout intent/plan  Therapist Response: Patients depression and anxiety are strong today. She is unable to control her crying throughout the session and is frustrated by this lack of control. Her thinking is rigid, distorted and black and white. She demonstrates extremes in her thinking and responds to feedback with irritability. When  asked if she feels more irritable, she says no. Discussed and processed her expectations of her brother and how this leads to chronic disappointment in her. Discussed co-dependency and educated patient on this type of interaction. Provided guidance regarding her health, provided her with information for [facility] orthopedics, as well as, information for lap band surgery through [facility]. Used CBT to assist patient with the identification of her negative distortions and irrational thoughts. Encouraged patient to verbalize alternative and factual responses which challenge her distortions. Used motivational interviewing to assist and encourage patient through the change process. Explored patients barriers to change. Used DBT to help patient build distress tolerance. Reviewed patients self care plan. Assessed her progress related to self care. Patient's self care is poor. Recommend proper diet, regular exercise, socialization and recreation.   Plan: Return again in two weeks.  Diagnosis: Axis I: Major Depression, Recurrent severe    Axis II: No diagnosis    Adri Schloss, LCSW 09/11/2011

## 2011-09-17 ENCOUNTER — Ambulatory Visit (INDEPENDENT_AMBULATORY_CARE_PROVIDER_SITE_OTHER): Payer: Medicare Other | Admitting: Psychiatry

## 2011-09-17 ENCOUNTER — Encounter (HOSPITAL_COMMUNITY): Payer: Self-pay | Admitting: Psychiatry

## 2011-09-17 VITALS — BP 129/97 | HR 84 | Wt 316.0 lb

## 2011-09-17 DIAGNOSIS — F339 Major depressive disorder, recurrent, unspecified: Secondary | ICD-10-CM

## 2011-09-17 NOTE — Progress Notes (Signed)
Chief complaint I am doing Better on Seroquel   History of present illness Patient is 56 year old Caucasian divorced unemployed female who came for her followup appointment. On her last visit Patient was given Geodon however patient could not afford and requested a different medication. She was given Seroquel 50 mg at bedtime. Initially patient complained of excessive sedation however she is tolerating 50 mg better. She has stopped taking Klonopin completely and that has caused some anxiety to get worse. She complained of jerking movements flashback and dreams in the night. She admitted taking Klonopin at bedtime with Seroquel help her sleep. She also endorse anxiety during the day but overall she reported better mood. She is less depressed and less tearful. She has some energy and motivation. She is seeing therapist regularly to like to keep her appointment in the future. At this time patient denies any side effects however she is still have insomnia and bad dreams. She is more social and denies any feeling of hopelessness or helplessness.  Current psychiatric medication Lexapro 20 mg daily, still has 6 refills remaining Neurontin 300 mg twice a day Seroquel 50 mg at bedtime She is not taking Klonopin as prescribed and she had to stop Ambien. She is using refills on her Klonopin and Lexapro from her previous psychiatrist.  Past psychiatric history Patient told she has been depressed for almost 20 years. She had tried amitriptyline, nortriptyline, Remeron, Seroquel, Geodon, Abilify, Cymbalta, Prozac, Paxil, Zoloft, Wellbutrin, Effexor and lithium. She do not recall the effects of these medication but felt he today stopped working her having side effects. She denies any history of suicidal attempt or any inpatient psychiatric treatment. She also denied any history of mania or psychosis. She has been seeing Dr. Haskel Khan  In [place] for 20 years. She has not seen him in one year but getting prescription on  the phone.   Family history Patient endorsed mother and father has history of depression  Psychosocial history Patient was born and raised in [place]. She married once but has no children. As mentioned above patient recently moved to [facility] to live close with her brother. Patient was very involved taking care of her mother who is now a nursing home in [place].  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  Medical history She see Johnn Hai at [facility] physician. Her last blood work was done in July. Patient has history of arthritis, hypertension, UTI, fibromyalgia, chronic pain syndrome, GERD, knee pain and diabetes mellitus.  Alcohol and substance use history Patient has a history of alcohol or substance use  Mental status examination Patient is morbid obese female who is casually dressed and well groomed. She maintained good eye contact. She appeared anxious but relevant. Her affect is improved from the past. She denies any auditory or visual hallucination. Her speech is clear and soft. She denies any active or passive suicidal thinking and homicidal thinking. There were no psychotic symptoms present. She's alert and oriented x3. There were no tremors or shakes present. Her insight judgment and impulse control is okay.  Diagnosis Axis I Maj. depressive disorder Axis II deferred Axis III see medical history Axis IV other to moderate Axis V 55-60  Plan I reviewed history, medication and collateral information. I recommended to take Klonopin 0.5 mg in the morning and also at bedtime to avoid withdrawals. She can repeat 0.5 mg Klonopin at bedtime if she still have difficulty  sleeping. She will continue Seroquel 50 mg at bedtime. I recommend to contact family care physician to have a new blood work. I explained risks and benefits of medication including metabolic side effects of Seroquel.  She will continue to see therapist in this office for increase coping and social skills. I recommended to call us if she has any question or concern otherwise I will see her again in 4 weeks. Time spent 30 minutes

## 2011-09-18 ENCOUNTER — Ambulatory Visit (HOSPITAL_COMMUNITY): Payer: Self-pay | Admitting: Licensed Clinical Social Worker

## 2011-09-22 DIAGNOSIS — M1710 Unilateral primary osteoarthritis, unspecified knee: Secondary | ICD-10-CM | POA: Diagnosis not present

## 2011-09-22 DIAGNOSIS — F329 Major depressive disorder, single episode, unspecified: Secondary | ICD-10-CM | POA: Diagnosis not present

## 2011-09-22 DIAGNOSIS — IMO0001 Reserved for inherently not codable concepts without codable children: Secondary | ICD-10-CM | POA: Diagnosis not present

## 2011-09-22 DIAGNOSIS — M069 Rheumatoid arthritis, unspecified: Secondary | ICD-10-CM | POA: Diagnosis not present

## 2011-09-23 ENCOUNTER — Telehealth (HOSPITAL_COMMUNITY): Payer: Self-pay | Admitting: *Deleted

## 2011-09-23 NOTE — Telephone Encounter (Signed)
Was instructed to try taking 0.5mg  Klonopin at bedtime, and repeat dose only if necessary. Wanted you to know unable to sleep easily or well with only 0.5 mg Klonopin, so has been taking second 0.5mg  dose most nights,

## 2011-09-25 ENCOUNTER — Ambulatory Visit (INDEPENDENT_AMBULATORY_CARE_PROVIDER_SITE_OTHER): Payer: Medicare Other | Admitting: Licensed Clinical Social Worker

## 2011-09-25 ENCOUNTER — Encounter (HOSPITAL_COMMUNITY): Payer: Self-pay | Admitting: Licensed Clinical Social Worker

## 2011-09-25 DIAGNOSIS — F332 Major depressive disorder, recurrent severe without psychotic features: Secondary | ICD-10-CM

## 2011-09-25 NOTE — Progress Notes (Signed)
   THERAPIST PROGRESS NOTE  Session Time: 8:30am-9:20am  Participation Level: Active  Behavioral Response: Well GroomedAlertAnxious and Depressed  Type of Therapy: Individual Therapy  Treatment Goals addressed: Anxiety, Communication: with family and freinds, Coping and Diagnosis: depression  Interventions: CBT, DBT, Motivational Interviewing, Solution Focused and Supportive  Summary: Jasmine Huff is a 56 y.o. female who presents with depressed mood and anxious affect. She reports some improvement in her degree of depression, anxiety and crying spells since restarting Seroquel and she is pleased that she is having a positive reaction to the medication. She has been writing in her journal more often and finds this process easier each day. She shows this therapist a drawing she did of herself and her "baggage". She discusses her baggage as feeling responsible for others happiness, particularly her brothers. She plans to visit her mother tomorrow and while she is looking forward to this, she dreads the pain from the drive and questions if she can really do this. She talks negatively about herself, with strong cognitive distortions, making negative assumptions about others. She finds little to relieve her pain and is frustrated by this. Her sleep is improving and her appetite remains inconsistent.    Suicidal/Homicidal: Nowithout intent/plan  Therapist Response: Processed w/pt her feelings of depression, anxiety, frustration with her current life experience and loss of independence and functioning. Normalized her grief process. Educated patient on codependency and provided her handouts explaining the dysfunctional patterns of interacting with others. Explored the history of her codependency. Recommend she read "Codependent No More" and "Boundaries". Recommend she continue writing in her journal. Used CBT to assist patient with the identification of her negative distortions and irrational thoughts.  Encouraged patient to verbalize alternative and factual responses which challenge her distortions. Used motivational interviewing to assist and encourage patient through the change process. Explored patients barriers to change.  Used DBT to help patient build distress tolerance. Reviewed patients self care plan. Assessed her progress related to self care. Patient's self care is poor. Recommend proper diet, regular exercise, socialization and recreation.   Plan: Return again in one weeks.  Diagnosis: Axis I: Major Depression, Recurrent severe    Axis II: No diagnosis    Jasmine Weisensel, LCSW 09/25/2011

## 2011-10-02 ENCOUNTER — Ambulatory Visit (HOSPITAL_COMMUNITY): Payer: Self-pay | Admitting: Licensed Clinical Social Worker

## 2011-10-02 DIAGNOSIS — N3941 Urge incontinence: Secondary | ICD-10-CM | POA: Diagnosis not present

## 2011-10-02 DIAGNOSIS — R350 Frequency of micturition: Secondary | ICD-10-CM | POA: Diagnosis not present

## 2011-10-06 ENCOUNTER — Telehealth (HOSPITAL_COMMUNITY): Payer: Self-pay | Admitting: *Deleted

## 2011-10-06 MED ORDER — CLONAZEPAM 0.5 MG PO TABS: ORAL_TABLET | ORAL | Status: DC

## 2011-10-06 NOTE — Telephone Encounter (Signed)
Left ZO:XWRUE refill on Klonopin, but 1 mg pills are too hard to split. Can she have 0.5mg  instead?Dr.Arfeen informed/consulted. Klonopin 0.5mg , 1 in AM and 1-2 at HS ordered. Patient informed.

## 2011-10-07 DIAGNOSIS — R222 Localized swelling, mass and lump, trunk: Secondary | ICD-10-CM | POA: Diagnosis not present

## 2011-10-07 DIAGNOSIS — R911 Solitary pulmonary nodule: Secondary | ICD-10-CM | POA: Diagnosis not present

## 2011-10-07 DIAGNOSIS — J479 Bronchiectasis, uncomplicated: Secondary | ICD-10-CM | POA: Diagnosis not present

## 2011-10-07 DIAGNOSIS — K449 Diaphragmatic hernia without obstruction or gangrene: Secondary | ICD-10-CM | POA: Diagnosis not present

## 2011-10-07 DIAGNOSIS — Z90710 Acquired absence of both cervix and uterus: Secondary | ICD-10-CM | POA: Diagnosis not present

## 2011-10-07 DIAGNOSIS — R079 Chest pain, unspecified: Secondary | ICD-10-CM | POA: Diagnosis not present

## 2011-10-09 ENCOUNTER — Ambulatory Visit (INDEPENDENT_AMBULATORY_CARE_PROVIDER_SITE_OTHER): Payer: Medicare Other | Admitting: Licensed Clinical Social Worker

## 2011-10-09 ENCOUNTER — Telehealth (HOSPITAL_COMMUNITY): Payer: Self-pay

## 2011-10-09 DIAGNOSIS — F332 Major depressive disorder, recurrent severe without psychotic features: Secondary | ICD-10-CM

## 2011-10-10 NOTE — Progress Notes (Signed)
   THERAPIST PROGRESS NOTE  Session Time: 9:30am-10:20am  Participation Level: Active  Behavioral Response: Well GroomedAlertAnxious and Depressed  Type of Therapy: Individual Therapy  Treatment Goals addressed: Coping  Interventions: CBT, DBT, Strength-based, Supportive and Family Systems  Summary: Jasmine Huff is a 56 y.o. female who presents with depressed mood and anxious affect. She reports some improvement in her depression, but over the past few days reports that her sadness has returned. She is tearful throughout the session, particularly when she reports that she was unable to visit her mother due to her pain. She believes that she uses her pain as a way to avoid visiting her mother. She finds it difficult to see her mother the way she is, on pain medication and not lucid. She feels guilty that she didn't make the trip and knows she disappointed her mother. She is tired of visiting doctors and recently saw a doctor at [facility] to look at the cyst in her chest, which burst and has now been reabsorbed into her body. She is now concerned that she must deal with her joint pain and move forward to have surgery which she is fearful of. Her sleep is poor and her appetite is decreased.    Suicidal/Homicidal: Nowithout intent/plan  Therapist Response: Reviewed patients progress and assessed current functioning. Processed with patient her feelings of sadness, anxiety and fear. Explored her negative assumptions regarding her mother and brother and her expectations of herself and of others. Her thinking is strongly negative, but she responds well to redirection. Used CBT to assist patient with the identification of her negative distortions and irrational thoughts. Encouraged patient to verbalize alternative and factual responses which challenge her distortions. Used DBT to help patient build distress tolerance skills. Recommend she continue keeping a journal. She demonstrates difficulty  identifying her feelings. Used motivational interviewing to assist and encourage patient through the change process. Explored patients barriers to change. Reviewed patients self care plan. Assessed her progress related to self care. Patient's self care is poor. Recommend proper diet, regular exercise, socialization and recreation.   Plan: Return again in one weeks.  Diagnosis: Axis I: Major Depression, Recurrent severe    Axis II: No diagnosis    Jasmine Chiriboga, LCSW 10/10/2011

## 2011-10-15 ENCOUNTER — Ambulatory Visit (HOSPITAL_COMMUNITY): Payer: Self-pay | Admitting: Psychiatry

## 2011-10-17 ENCOUNTER — Encounter (HOSPITAL_COMMUNITY): Payer: Self-pay | Admitting: Psychiatry

## 2011-10-17 ENCOUNTER — Ambulatory Visit (INDEPENDENT_AMBULATORY_CARE_PROVIDER_SITE_OTHER): Payer: Medicare Other | Admitting: Psychiatry

## 2011-10-17 VITALS — BP 101/84 | HR 77 | Wt 315.0 lb

## 2011-10-17 DIAGNOSIS — Z79899 Other long term (current) drug therapy: Secondary | ICD-10-CM

## 2011-10-17 NOTE — Progress Notes (Signed)
Chief complaint I am feeling very tired.     History of present illness Patient is 56 year old Caucasian divorced unemployed female who came for her followup appointment.  Patient complained of feeling tired and exhaustion .  Though she is sleeping better but she complained of decreased appetite and does not want to do anything.  She thought she had lost a lot of weight however her weight is marginally lost .  Patient started to cry due to extreme tired feeling .  She endorse difficulty doing things due to decreased energy .  Patient endorse that she is not sure if the Seroquel causing it however she likes her work well and she is able to sleep 6 hours .  Patient also taking Klonopin now 0.5 mg in the morning and 1 mg at bedtime .  Her nightmares and dreams are much control and they are less intense and less frequent.  She endorse frequent crying spells but overall she reported her depression is improved .  She endorse guilty as she is not able to see her mother regularly but she talks to her on the phone daily .  Patient endorse brother has been very supportive .  She is seeing therapist regularly.  She denies any agitation anger or mood swings.  She denies any hopeless or helpless feeling.  There are no psychotic symptoms or paranoia.  Current psychiatric medication Klonopin 0.5 mg 1 in the morning and 2 at bedtime Lexapro 20 mg daily Seroquel 100 mg at bedtime  Neurontin 300 mg twice a day.  Past psychiatric history Patient told she has been depressed for almost 20 years. She had tried amitriptyline, nortriptyline, Remeron, Seroquel, Geodon, Abilify, Cymbalta, Prozac, Paxil, Zoloft, Wellbutrin, Effexor and lithium. She do not recall the effects of these medication but felt he today stopped working her having side effects. She denies any history of suicidal attempt or any inpatient psychiatric treatment. She also denied any history of mania or psychosis. She has been seeing Dr. Haskel Khan  In [place] for  20 years. She has not seen him in one year but getting prescription on the phone.   Family history Patient endorsed mother and father has history of depression  Psychosocial history Patient was born and raised in [place]. She married once but has no children. As mentioned above patient recently moved to [facility] to live close with her brother. Patient was very involved taking care of her mother who is now a nursing home in [place].  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  Medical history She see Johnn Hai at [facility] physician. Her last blood work was done in July. Patient has history of arthritis, hypertension, UTI, fibromyalgia, chronic pain syndrome, GERD, knee pain and diabetes mellitus.  Alcohol and substance use history Patient has a history of alcohol or substance use  Mental status examination Patient is morbid obese female who is casually dressed and well groomed. She maintained fair eye contact. She appeared anxious but relevant. Her affect is improved from the past. She denies any auditory or visual hallucination. Her speech is clear and soft. She denies any active or passive suicidal thinking and homicidal thinking. There were no psychotic symptoms present. She's alert and oriented x3. There were no tremors or shakes present. Her insight judgment and impulse control is okay.  Diagnosis Axis I Maj. depressive disorder Axis II deferred Axis III see medical history Axis IV other  to moderate Axis V 55-60  Plan I reviewed history, medication and collateral information.  She has tried Abilify, Geodon and lithium.  Though she liked Geodon however she cannot afford due to expense.  I recommended to discontinue Seroquel and try saphrs 5 mg at bedtime.  I explained risks and benefits of medication including metabolic side effects.  I also encouraged her to see her primary care  physician.  She has not seen her primary care physician in past 6 months. I will do blood tests including CBC, CMP and hemoglobin A1c.  She will continue Klonopin 1 mg at bedtime and 0.5 mg during the day.  I recommended to call us if she continues to feel seen her symptoms get worse.  I will see her again in 3 weeks.  Time spent 30 minutes.

## 2011-10-21 ENCOUNTER — Telehealth (HOSPITAL_COMMUNITY): Payer: Self-pay | Admitting: *Deleted

## 2011-10-21 DIAGNOSIS — R350 Frequency of micturition: Secondary | ICD-10-CM | POA: Diagnosis not present

## 2011-10-21 DIAGNOSIS — N3941 Urge incontinence: Secondary | ICD-10-CM | POA: Diagnosis not present

## 2011-10-21 DIAGNOSIS — Z79899 Other long term (current) drug therapy: Secondary | ICD-10-CM | POA: Diagnosis not present

## 2011-10-21 LAB — COMPREHENSIVE METABOLIC PANEL
AST: 13 U/L (ref 0–37)
Alkaline Phosphatase: 75 U/L (ref 39–117)
BUN: 21 mg/dL (ref 6–23)
Calcium: 8.6 mg/dL (ref 8.4–10.5)
Chloride: 105 mEq/L (ref 96–112)
Creat: 0.79 mg/dL (ref 0.50–1.10)
Total Bilirubin: 0.2 mg/dL — ABNORMAL LOW (ref 0.3–1.2)

## 2011-10-21 NOTE — Telephone Encounter (Signed)
10/21/11 1428:Left VM:Dr.Arfeen gave RX for Saphris on last visit.Sleep is okay, but lips tingle,tongue goes numb and very short of breath when walking short distances-i.e. to her bedroom.   10/21/11 1552: Contacted ZO:XWRUEAVW information from VM.States very scared and anxious.States she has looked up side effects and does not want to take this med any more.Said she will resume Seroquel at old dose. Instructed pt to contact PCP to report symptoms as precaution.

## 2011-10-22 ENCOUNTER — Encounter (HOSPITAL_COMMUNITY): Payer: Self-pay | Admitting: *Deleted

## 2011-10-22 ENCOUNTER — Telehealth (HOSPITAL_COMMUNITY): Payer: Self-pay | Admitting: *Deleted

## 2011-10-22 DIAGNOSIS — M069 Rheumatoid arthritis, unspecified: Secondary | ICD-10-CM | POA: Diagnosis not present

## 2011-10-22 DIAGNOSIS — M7500 Adhesive capsulitis of unspecified shoulder: Secondary | ICD-10-CM | POA: Diagnosis not present

## 2011-10-22 DIAGNOSIS — M1710 Unilateral primary osteoarthritis, unspecified knee: Secondary | ICD-10-CM | POA: Diagnosis not present

## 2011-10-22 DIAGNOSIS — IMO0001 Reserved for inherently not codable concepts without codable children: Secondary | ICD-10-CM | POA: Diagnosis not present

## 2011-10-22 LAB — CBC WITH DIFFERENTIAL/PLATELET
Basophils Relative: 0 % (ref 0–1)
Eosinophils Absolute: 0.3 10*3/uL (ref 0.0–0.7)
MCH: 26.9 pg (ref 26.0–34.0)
MCHC: 29.8 g/dL — ABNORMAL LOW (ref 30.0–36.0)
Neutrophils Relative %: 60 % (ref 43–77)
Platelets: 373 10*3/uL (ref 150–400)
RBC: 4.42 MIL/uL (ref 3.87–5.11)

## 2011-10-22 NOTE — Telephone Encounter (Signed)
10/22/11 1001: Contacted to see if she is feeling better today.States has not had any episodes of shortness of breath today,and she expects to feel better as the med leaves her system.Informed pt that Saphris will be entered as an allergy. Also instructed patient to resume use of Seroquel per Dr.Arfeen's orders. Instructed to call PRN

## 2011-10-23 ENCOUNTER — Telehealth (HOSPITAL_COMMUNITY): Payer: Self-pay | Admitting: *Deleted

## 2011-10-23 ENCOUNTER — Ambulatory Visit (HOSPITAL_COMMUNITY): Payer: Self-pay | Admitting: Licensed Clinical Social Worker

## 2011-10-23 DIAGNOSIS — F339 Major depressive disorder, recurrent, unspecified: Secondary | ICD-10-CM

## 2011-10-23 DIAGNOSIS — Z79899 Other long term (current) drug therapy: Secondary | ICD-10-CM

## 2011-10-23 MED ORDER — TEMAZEPAM 7.5 MG PO CAPS: 7.5000 mg | ORAL_CAPSULE | Freq: Every evening | ORAL | Status: DC | PRN

## 2011-10-23 NOTE — Telephone Encounter (Signed)
Per pharmacist, medication requires prior authorization thru [facility] at [phone]. 10/23/11 1438: Contacted [facility]. Medication authorized thru 10/23/11

## 2011-10-23 NOTE — Telephone Encounter (Signed)
Message copied by Tonny Bollman on Thu Oct 23, 2011  1:45 PM ------      Message from: Kathryne Sharper T      Created: Wed Oct 22, 2011  3:45 PM       Yes she can try 150 mg at bed time. Call us if she has any side effects. Called patient with diirections

## 2011-10-24 ENCOUNTER — Other Ambulatory Visit (HOSPITAL_COMMUNITY): Payer: Self-pay | Admitting: Psychiatry

## 2011-10-24 ENCOUNTER — Encounter (HOSPITAL_COMMUNITY): Payer: Self-pay | Admitting: *Deleted

## 2011-10-24 DIAGNOSIS — F339 Major depressive disorder, recurrent, unspecified: Secondary | ICD-10-CM

## 2011-10-24 DIAGNOSIS — Z79899 Other long term (current) drug therapy: Secondary | ICD-10-CM

## 2011-10-24 MED ORDER — TEMAZEPAM 7.5 MG PO CAPS: 15.0000 mg | ORAL_CAPSULE | Freq: Every evening | ORAL | Status: DC | PRN

## 2011-10-24 NOTE — Progress Notes (Signed)
[facility] authorized Temazepam 7.5 mg from 10/23/11 to 10/22/12

## 2011-10-27 ENCOUNTER — Emergency Department (HOSPITAL_COMMUNITY)
Admission: EM | Admit: 2011-10-27 | Discharge: 2011-10-28 | Disposition: A | Discharge status: Home / Self Care | Payer: Medicare Other | Attending: Emergency Medicine | Admitting: Emergency Medicine

## 2011-10-27 ENCOUNTER — Emergency Department (HOSPITAL_COMMUNITY): Payer: Medicare Other

## 2011-10-27 ENCOUNTER — Encounter (HOSPITAL_COMMUNITY): Payer: Self-pay | Admitting: *Deleted

## 2011-10-27 DIAGNOSIS — M2550 Pain in unspecified joint: Secondary | ICD-10-CM | POA: Insufficient documentation

## 2011-10-27 DIAGNOSIS — K219 Gastro-esophageal reflux disease without esophagitis: Secondary | ICD-10-CM | POA: Diagnosis not present

## 2011-10-27 DIAGNOSIS — R0602 Shortness of breath: Secondary | ICD-10-CM | POA: Insufficient documentation

## 2011-10-27 DIAGNOSIS — F411 Generalized anxiety disorder: Secondary | ICD-10-CM | POA: Diagnosis not present

## 2011-10-27 DIAGNOSIS — F3289 Other specified depressive episodes: Secondary | ICD-10-CM | POA: Insufficient documentation

## 2011-10-27 DIAGNOSIS — I10 Essential (primary) hypertension: Secondary | ICD-10-CM | POA: Insufficient documentation

## 2011-10-27 DIAGNOSIS — R110 Nausea: Secondary | ICD-10-CM | POA: Insufficient documentation

## 2011-10-27 DIAGNOSIS — M25569 Pain in unspecified knee: Secondary | ICD-10-CM | POA: Diagnosis not present

## 2011-10-27 DIAGNOSIS — R52 Pain, unspecified: Secondary | ICD-10-CM | POA: Diagnosis not present

## 2011-10-27 DIAGNOSIS — F329 Major depressive disorder, single episode, unspecified: Secondary | ICD-10-CM | POA: Insufficient documentation

## 2011-10-27 LAB — BASIC METABOLIC PANEL
CO2: 33 mEq/L — ABNORMAL HIGH (ref 19–32)
Chloride: 100 mEq/L (ref 96–112)
Potassium: 4.2 mEq/L (ref 3.5–5.1)
Sodium: 140 mEq/L (ref 135–145)

## 2011-10-27 LAB — CBC
MCV: 87.8 fL (ref 78.0–100.0)
Platelets: 362 10*3/uL (ref 150–400)
RBC: 4.33 MIL/uL (ref 3.87–5.11)
WBC: 9.7 10*3/uL (ref 4.0–10.5)

## 2011-10-27 LAB — D-DIMER, QUANTITATIVE: D-Dimer, Quant: 0.86 ug/mL-FEU — ABNORMAL HIGH (ref 0.00–0.48)

## 2011-10-27 MED ORDER — METHYLPREDNISOLONE SODIUM SUCC 125 MG IJ SOLR
125.0000 mg | Freq: Once | INTRAMUSCULAR | Status: AC
  Administered 2011-10-27: 125 mg via INTRAVENOUS
  Filled 2011-10-27: qty 2

## 2011-10-27 MED ORDER — HYDROMORPHONE HCL PF 1 MG/ML IJ SOLN
1.0000 mg | Freq: Once | INTRAMUSCULAR | Status: AC
  Administered 2011-10-28: 1 mg via INTRAVENOUS
  Filled 2011-10-27: qty 1

## 2011-10-27 MED ORDER — DIPHENHYDRAMINE HCL 50 MG/ML IJ SOLN
50.0000 mg | Freq: Once | INTRAMUSCULAR | Status: AC
  Administered 2011-10-27: 50 mg via INTRAVENOUS
  Filled 2011-10-27: qty 1

## 2011-10-27 MED ORDER — OXYCODONE-ACETAMINOPHEN 5-325 MG PO TABS: 1.0000 | ORAL_TABLET | Freq: Four times a day (QID) | ORAL | Status: AC | PRN

## 2011-10-27 MED ORDER — IOHEXOL 350 MG/ML SOLN
120.0000 mL | Freq: Once | INTRAVENOUS | Status: AC | PRN
  Administered 2011-10-27: 120 mL via INTRAVENOUS

## 2011-10-27 MED ORDER — FAMOTIDINE 20 MG PO TABS: 20.0000 mg | ORAL_TABLET | Freq: Two times a day (BID) | ORAL | Status: DC

## 2011-10-27 MED ORDER — SODIUM CHLORIDE 0.9 % IV SOLN
INTRAVENOUS | Status: DC
  Administered 2011-10-27: 11:00:00 via INTRAVENOUS

## 2011-10-27 MED ORDER — LORAZEPAM 2 MG/ML IJ SOLN
1.0000 mg | Freq: Once | INTRAMUSCULAR | Status: AC
  Administered 2011-10-27: 1 mg via INTRAVENOUS
  Filled 2011-10-27: qty 1

## 2011-10-27 MED ORDER — SODIUM CHLORIDE 0.9 % IV BOLUS (SEPSIS)
250.0000 mL | Freq: Once | INTRAVENOUS | Status: AC
  Administered 2011-10-27: 250 mL via INTRAVENOUS

## 2011-10-27 MED ORDER — HYDROMORPHONE HCL PF 2 MG/ML IJ SOLN
2.0000 mg | Freq: Once | INTRAMUSCULAR | Status: AC
  Administered 2011-10-27: 2 mg via INTRAMUSCULAR
  Filled 2011-10-27: qty 1

## 2011-10-27 NOTE — Discharge Instructions (Signed)
Start taking Pepcid for the swelling around the end of the esophagus. Take pain medicine as directed for your knee pain. Wear knee immobilizer as needed for comfort. Followup with [facility] orthopedics as scheduled. Followup with her primary care Dr. Next few days. Continue followup with your pain clinic. Return for new worse symptoms.

## 2011-10-27 NOTE — ED Notes (Signed)
Right knee pain since Friday, pain progressively worse and now pt not able to ambulate on it, SOB for a week, pt states that she has had a lot of health problems for the last year and half

## 2011-10-27 NOTE — ED Provider Notes (Signed)
History   This chart was scribed for Jasmine Jakes, MD by [name]. The patient was seen in room APA06/APA06. Patient's care was started at 1438.    CSN: [id number]  Arrival date & time 10/27/11  1438   First MD Initiated Contact with Patient 10/27/11 1531      Chief Complaint  Patient presents with  . Shortness of Breath  -Right knee pain  (Consider location/radiation/quality/duration/timing/severity/associated sxs/prior treatment) HPI Jasmine Huff is a 56 y.o. female who presents to the Emergency Department complaining of constant moderate SOB onset 1 week ago and persistent since with associated nausea.  Denies chest pain, sore throat, HA, vomiting, diarrhea, back pain, rash.  Patient also c/o sharp, stabbing right knee pain onset 2 days ago and worsening since. Patient states right knee pain is worse with standing, walking and additional weight bearing. Reports she is currently receiving Cynvisc shots within right knee which have controlled pain for the past several months. Denies recent injury/trauma. Patient with h/o arthritis, HTN, migraine, fibromyalgia, GERD, cholecystectomy, abdominal hysterectomy.  PCP- [facility], [employer], [place]  Past Medical History  Diagnosis Date  . Arthritis   . Depression   . Allergy   . Hypertension   . Migraine   . UTI (lower urinary tract infection)   . Fibromyalgia   . Chronic pain syndrome   . GERD (gastroesophageal reflux disease) 01/21/2011  . Anxiety   . Panic attacks   . High blood pressure   . Knee pain   . Urination frequency     Past Surgical History  Procedure Date  . Cholecystectomy   . Abdominal hysterectomy   . Right ovary removal     Family History  Problem Relation Age of Onset  . Depression Mother   . Depression Father   . Alcohol abuse Brother   . Depression Brother   . Depression Maternal Grandfather   . Depression Other     History  Substance Use Topics  . Smoking status: Never  Smoker   . Smokeless tobacco: Never Used  . Alcohol Use: No    OB History    Grav Para Term Preterm Abortions TAB SAB Ect Mult Living                  Review of Systems  Constitutional: Negative for fever.  HENT: Negative for rhinorrhea.   Eyes: Negative for pain.  Respiratory: Positive for shortness of breath. Negative for cough.   Cardiovascular: Negative for chest pain.  Gastrointestinal: Positive for nausea. Negative for vomiting, abdominal pain and diarrhea.  Genitourinary: Negative for dysuria.  Musculoskeletal: Positive for arthralgias. Negative for back pain.  Skin: Negative for rash.  Neurological: Negative for weakness and headaches.    Allergies  Lyrica; Saphris; and Iodinated diagnostic agents  Home Medications   Current Outpatient Rx  Name Route Sig Dispense Refill  . ASPIRIN EC 81 MG PO TBEC Oral Take 81 mg by mouth daily.    Marland Kitchen CETIRIZINE HCL 10 MG PO TABS Oral Take 10 mg by mouth at bedtime.     Marland Kitchen CLONAZEPAM 0.5 MG PO TABS  Take 1 (0.5mg ) tablet in the morning and 1-2 tablets (0.5mg -1 mg) at bedtime 90 tablet 0  . HYDROXYCHLOROQUINE SULFATE 200 MG PO TABS Oral Take 200 mg by mouth 2 (two) times daily.     Marland Kitchen LEXAPRO 20 MG PO TABS  TAKE 1 TABLET DAILY 30 each 11  . LISINOPRIL-HYDROCHLOROTHIAZIDE 20-12.5 MG PO TABS Oral Take 1 tablet  by mouth daily. 30 tablet 11  . METFORMIN HCL ER 500 MG PO TB24 Oral Take 500 mg by mouth at bedtime.    . METHOCARBAMOL 500 MG PO TABS Oral Take 500 mg by mouth daily as needed. For muscle spasms    . OMEPRAZOLE 40 MG PO CPDR Oral Take 40 mg by mouth at bedtime.    Dante Gang ER (CRUSH RESISTANT) 10 MG PO TB12 Oral Take 10-20 mg by mouth every 12 (twelve) hours.     . OXYCODONE-ACETAMINOPHEN 10-325 MG PO TABS Oral Take 1 tablet by mouth every 4 (four) hours as needed. For pain    . TEMAZEPAM 7.5 MG PO CAPS Oral Take 2 capsules (15 mg total) by mouth at bedtime and may repeat dose one time if needed. 30 capsule 0  . FAMOTIDINE 20 MG PO  TABS Oral Take 1 tablet (20 mg total) by mouth 2 (two) times daily. 30 tablet 0  . OXYCODONE-ACETAMINOPHEN 5-325 MG PO TABS Oral Take 1-2 tablets by mouth every 6 (six) hours as needed for pain. 14 tablet 0    BP 140/64  Pulse 70  Temp(Src) 98 F (36.7 C) (Oral)  Resp 18  Ht 5\' 3"  (1.6 m)  Wt 300 lb (136.079 kg)  BMI 53.14 kg/m2  SpO2 94%  Physical Exam  Nursing note and vitals reviewed. Constitutional: She is oriented to person, place, and time. She appears well-developed and well-nourished. No distress.  HENT:  Head: Normocephalic and atraumatic.  Eyes: EOM are normal. Pupils are equal, round, and reactive to light.  Neck: Neck supple. No tracheal deviation present.  Cardiovascular: Normal rate and regular rhythm.  Exam reveals no gallop and no friction rub.   No murmur heard. Pulmonary/Chest: Effort normal. No respiratory distress. She has no wheezes. She has no rales.  Abdominal: Soft. Bowel sounds are normal. She exhibits no distension. There is no tenderness.  Musculoskeletal: Normal range of motion. She exhibits tenderness. She exhibits no edema.       DP pulses 2+ bilaterally. No redness or effusion of right knee. Right patella within proper location. No deformity noted. Radial pulses 2+ bilaterally.   Lymphadenopathy:    She has no cervical adenopathy.  Neurological: She is alert and oriented to person, place, and time. No cranial nerve deficit or sensory deficit.       Distal neurovascular intact.   Skin: Skin is warm and dry.  Psychiatric: She has a normal mood and affect. Her behavior is normal.    ED Course  Procedures (including critical care time)  DIAGNOSTIC STUDIES: Oxygen Saturation is 94% on room air, adequate by my interpretation.    COORDINATION OF CARE: 3:45PM-Patient informed of current plan for treatment and evaluation and agrees with plan at this time.     Labs Reviewed  CBC - Abnormal; Notable for the following:    Hemoglobin 11.9 (*)    All  other components within normal limits  BASIC METABOLIC PANEL - Abnormal; Notable for the following:    CO2 33 (*)    Glucose, Bld 151 (*)    GFR calc non Af Amer 81 (*)    All other components within normal limits  D-DIMER, QUANTITATIVE - Abnormal; Notable for the following:    D-Dimer, Quant 0.86 (*)    All other components within normal limits   Ct Angio Chest W/cm &/or Wo Cm  10/27/2011  *RADIOLOGY REPORT*  Clinical Data: Shortness of breath.  The patient reports a prior history of  hives after administration of IV contrast at an outside institution.  The patient was premedicated using the emergent protocol and experienced no adverse reaction after administration of IV contrast.  CT ANGIOGRAPHY CHEST  Technique:  Multidetector CT imaging of the chest using the standard protocol during bolus administration of intravenous contrast. Multiplanar reconstructed images including MIPs were obtained and reviewed to evaluate the vascular anatomy.  Contrast: OMNIPAQUE IOHEXOL 350 MG/ML SOLN  Comparison: Chest radiograph same date  Findings: Fine detail is obscured by patient body habitus. Examination quality is mildly reduced due to early bolus timing. The study is adequate for evaluation for pulmonary embolism up to the 3rd order pulmonary arteries.  No filling defect is seen to suggest acute pulmonary embolism.  Great vessels are normal in caliber.  Heart size is normal.  No lymphadenopathy.  There is a small amount of low density posterior mediastinal fluid that appears to extend from the right lateral aspect of the esophagus, for example image 59.  No mediastinal free air or pneumothorax is identified.  No radiopaque esophageal foreign body. Esophageal wall thickness appears to be normal given technique.  8 mm right thyroid lobe hypodensity is unlikely to be significant. Probable bone island noted within lower thoracic vertebral bodies. No acute osseous finding.  Lung fields are clear.  The central  airways are patent.  IMPRESSION: Small amount of low density distal peri esophageal fluid.  This could be due to esophagitis and although no pneumomediastinum is identified, esophageal perforation is not excluded.  Further options include water-soluble esophagram or consult for possible endoscopy. Called to Dr. Jodelle Gross by Dr. Chilton Si at 915pm 10/27/11.  Original Report Authenticated By: Harrel Lemon, M.D.   Dg Chest Portable 1 View  10/27/2011  *RADIOLOGY REPORT*  Clinical Data: Shortness of breath.  PORTABLE CHEST - 1 VIEW  Comparison: None.  Findings: 1537 hours. The cardiopericardial silhouette is enlarged. The lungs are clear without focal infiltrate, edema, pneumothorax or pleural effusion. Imaged bony structures of the thorax are intact.  IMPRESSION: Cardiomegaly without acute cardiopulmonary findings.  Original Report Authenticated By: ERIC A. MANSELL, M.D.   Dg Knee Complete 4 Views Right  10/27/2011  *RADIOLOGY REPORT*  Clinical Data: Pain  RIGHT KNEE - COMPLETE 4+ VIEW  Comparison: None.  Findings: There is advanced tricompartmental osteoarthritis.  There is a small joint effusion.  There are intra-articular loose bodies. No acute traumatic finding.  IMPRESSION: Advanced tricompartmental osteoarthritis.  Small effusion.  Intra- articular loose bodies.  Original Report Authenticated By: Thomasenia Sales, M.D.    Date: 10/27/2011  Rate: 71  Rhythm: normal sinus rhythm  QRS Axis: normal  Intervals: normal  ST/T Wave abnormalities: normal  Conduction Disutrbances:none  Narrative Interpretation:   Old EKG Reviewed: none available Results for orders placed during the hospital encounter of 10/27/11  CBC      Component Value Range   WBC 9.7  4.0 - 10.5 (K/uL)   RBC 4.33  3.87 - 5.11 (MIL/uL)   Hemoglobin 11.9 (*) 12.0 - 15.0 (g/dL)   HCT 45.4  09.8 - 11.9 (%)   MCV 87.8  78.0 - 100.0 (fL)   MCH 27.5  26.0 - 34.0 (pg)   MCHC 31.3  30.0 - 36.0 (g/dL)   RDW 14.7  82.9 - 56.2 (%)    Platelets 362  150 - 400 (K/uL)  BASIC METABOLIC PANEL      Component Value Range   Sodium 140  135 - 145 (mEq/L)   Potassium 4.2  3.5 - 5.1 (mEq/L)   Chloride 100  96 - 112 (mEq/L)   CO2 33 (*) 19 - 32 (mEq/L)   Glucose, Bld 151 (*) 70 - 99 (mg/dL)   BUN 12  6 - 23 (mg/dL)   Creatinine, Ser 7.32  0.50 - 1.10 (mg/dL)   Calcium 9.3  8.4 - 20.2 (mg/dL)   GFR calc non Af Amer 81 (*) >90 (mL/min)   GFR calc Af Amer >90  >90 (mL/min)  D-DIMER, QUANTITATIVE      Component Value Range   D-Dimer, Quant 0.86 (*) 0.00 - 0.48 (ug/mL-FEU)     1. Knee pain   2. Shortness of breath       MDM   Shortness of breath workup negative for evidence of pneumonia pneumothorax or pulmonary embolism. Should etiology of the shortness of breath is not clear. CT scan to pick up some inflammation around the distal side esophagus do not feel this is consistent with a perforation based on the patient's symptomatology no severe epigastric or back pain. In the shortness of breath has been ongoing for a week. Of the right knee shows evidence of severe arthritic changes most likely in need of a knee replacement. Wear the knee immobilizer as directed take pain medicine as directed and followup with orthopedics as scheduled. Return for new worse symptoms. Return for any severe back pain or worse shortness of breath or development of chest pain.     I personally performed the services described in this documentation, which was scribed in my presence. The recorded information has been reviewed and considered.     Jasmine Jakes, MD 10/27/11 [phone]

## 2011-10-27 NOTE — ED Notes (Signed)
MD at bedside. 

## 2011-10-27 NOTE — ED Notes (Signed)
No redness noted to right knee, pt denies any injuries to right knee

## 2011-10-27 NOTE — ED Notes (Signed)
Shortness of breath for 1 week,  No cough,fever.  Pain rt knee since Friday.  Nausea for 1 month. With vomiting , No diarrhea.

## 2011-10-27 NOTE — ED Notes (Signed)
Pt very anxious at this time. 

## 2011-10-28 NOTE — ED Notes (Signed)
Discharge instructions reviewed with pt, questions answered. Pt verbalized understanding.  

## 2011-11-06 ENCOUNTER — Telehealth (HOSPITAL_COMMUNITY): Payer: Self-pay | Admitting: Licensed Clinical Social Worker

## 2011-11-06 ENCOUNTER — Ambulatory Visit (HOSPITAL_COMMUNITY): Payer: Self-pay | Admitting: Licensed Clinical Social Worker

## 2011-11-06 NOTE — Telephone Encounter (Signed)
Returned patients phone call. Patient is depressed and upset because she is now bed ridden because of pain in her knee. She is depressed with strong distorted thinking focusing on her life being over. Processed patients feelings, provided redirection for her cognitive distortions and reframing. Assisted her with a plan to keep her thinking in check while she is waiting for her appointment at the end of the month with a knee doctor. Assessed her safety and patient is not at risk for harm to self or others. Her family is her protective factor and commits to safety. Recommend patient remain in contact with friends and family for support. Will see patient when she can return.

## 2011-11-07 ENCOUNTER — Ambulatory Visit (HOSPITAL_COMMUNITY): Payer: Self-pay | Admitting: Psychiatry

## 2011-11-10 ENCOUNTER — Telehealth (HOSPITAL_COMMUNITY): Payer: Self-pay | Admitting: *Deleted

## 2011-11-10 ENCOUNTER — Other Ambulatory Visit (HOSPITAL_COMMUNITY): Payer: Self-pay | Admitting: Psychiatry

## 2011-11-10 NOTE — Telephone Encounter (Signed)
11/10/11, 1308 Left MV:HQIONGE left last week, still has not heard from Dr.Arfeen/office.Injured right knee,noweightbearing-appt w/MD 5/31.Having panic attacks and trouble sleeping.Wants to know if lab test results have been mailed to her and PCP?

## 2011-11-10 NOTE — Progress Notes (Signed)
Patient called and told that recently her knee pain get very worst.  She was seen by the doctor who recommended complete bedrest and surgery.  However she is concerned that no doctor we'll do surgery due to her increased weight.  Patient is not sleeping and having panic attack.  She is tearful and sad.  She denies any suicidal thoughts but endorse frustrated with the situation.  She cannot come for the appointment due to knee pain and limited mobility.  She had tried temazepam 15 mg with little sleep.  She's also taking Klonopin 0.5 mg twice a day.  I recommended try temazepam 30 mg and continue Klonopin as prescribed.  She is taking 2 benzodiazepine however in the past she was taking moderate dose of Klonopin.  I recommended try for another 2 days temazepam 30 mg and call us back if it helps.  Patient acknowledged with the program.  She will try this recommendation for 2 days and call us back on Wednesday.

## 2011-11-12 ENCOUNTER — Other Ambulatory Visit (HOSPITAL_COMMUNITY): Payer: Self-pay | Admitting: *Deleted

## 2011-11-12 DIAGNOSIS — Z79899 Other long term (current) drug therapy: Secondary | ICD-10-CM

## 2011-11-12 DIAGNOSIS — F339 Major depressive disorder, recurrent, unspecified: Secondary | ICD-10-CM

## 2011-11-12 MED ORDER — TEMAZEPAM 7.5 MG PO CAPS: 15.0000 mg | ORAL_CAPSULE | Freq: Every evening | ORAL | Status: DC | PRN

## 2011-11-12 MED ORDER — CLONAZEPAM 0.5 MG PO TABS: ORAL_TABLET | ORAL | Status: DC

## 2011-11-12 NOTE — Telephone Encounter (Signed)
11/12/11,1157:Patient Left ZO:XWRUEAVW 15mg  is working well. Needs refill of that and Klonopin.Also requested copy of most recent lab work sent to self and Dr.Sharon Paulino Rily 11/12/11,1615:Contacted patient.Dr.Arfeen authorized refills of both medicines.Labs will be sent to pt, and to MD if Release of Information has been signed.Patient stated it has not, but if staff will include one in envelope with labwork, she will fill it out and send it back.

## 2011-11-13 ENCOUNTER — Telehealth (HOSPITAL_COMMUNITY): Payer: Self-pay | Admitting: *Deleted

## 2011-11-13 DIAGNOSIS — F339 Major depressive disorder, recurrent, unspecified: Secondary | ICD-10-CM

## 2011-11-13 DIAGNOSIS — Z79899 Other long term (current) drug therapy: Secondary | ICD-10-CM

## 2011-11-13 MED ORDER — TEMAZEPAM 15 MG PO CAPS: 15.0000 mg | ORAL_CAPSULE | Freq: Every evening | ORAL | Status: DC | PRN

## 2011-11-13 NOTE — Telephone Encounter (Signed)
Contacted pt in response to VM regarding strength of Restoril.States per Dr.Arfeen's conversation with her last week it would be 15 mg, not 2-7.5mg  due to cost.RX.Called to pharmacy yesterday for7.5mg ,take 2, may repeat x1.Will clarify w/pharmacy.

## 2011-11-18 DIAGNOSIS — N3941 Urge incontinence: Secondary | ICD-10-CM | POA: Diagnosis not present

## 2011-11-18 DIAGNOSIS — R350 Frequency of micturition: Secondary | ICD-10-CM | POA: Diagnosis not present

## 2011-11-20 ENCOUNTER — Ambulatory Visit (HOSPITAL_COMMUNITY): Payer: Self-pay | Admitting: Licensed Clinical Social Worker

## 2011-11-24 ENCOUNTER — Telehealth (HOSPITAL_COMMUNITY): Payer: Self-pay | Admitting: *Deleted

## 2011-11-24 NOTE — Telephone Encounter (Signed)
Left ZO:XWRUEAVWU results of Hgb A1c drawn at [facility] around 10/20/11. Wants to take them to her PCP so that she does not have to have blood work repeated. 11/24/11, 1452:Contacted pt and told her results had been printed and were being sent to her home.

## 2011-12-04 ENCOUNTER — Ambulatory Visit (HOSPITAL_COMMUNITY): Payer: Self-pay | Admitting: Licensed Clinical Social Worker

## 2011-12-05 DIAGNOSIS — M1710 Unilateral primary osteoarthritis, unspecified knee: Secondary | ICD-10-CM | POA: Diagnosis not present

## 2011-12-08 ENCOUNTER — Encounter (HOSPITAL_COMMUNITY): Payer: Self-pay | Admitting: Licensed Clinical Social Worker

## 2011-12-08 ENCOUNTER — Ambulatory Visit (HOSPITAL_COMMUNITY): Payer: Self-pay | Admitting: Licensed Clinical Social Worker

## 2011-12-08 NOTE — Progress Notes (Signed)
Patient ID: Jasmine Huff, female   DOB: [birth year], 56 y.o.   MRN: [id number] Patient cancelled her appointment late due to car trouble. She will reschedule.

## 2011-12-09 ENCOUNTER — Other Ambulatory Visit (HOSPITAL_COMMUNITY): Payer: Self-pay | Admitting: *Deleted

## 2011-12-09 DIAGNOSIS — F339 Major depressive disorder, recurrent, unspecified: Secondary | ICD-10-CM

## 2011-12-09 DIAGNOSIS — Z79899 Other long term (current) drug therapy: Secondary | ICD-10-CM

## 2011-12-09 DIAGNOSIS — N3941 Urge incontinence: Secondary | ICD-10-CM | POA: Diagnosis not present

## 2011-12-10 ENCOUNTER — Other Ambulatory Visit (HOSPITAL_COMMUNITY): Payer: Self-pay | Admitting: *Deleted

## 2011-12-10 MED ORDER — TEMAZEPAM 15 MG PO CAPS: 15.0000 mg | ORAL_CAPSULE | Freq: Every evening | ORAL | Status: DC | PRN

## 2011-12-10 NOTE — Telephone Encounter (Signed)
Refill authorized by Dr.Arfeen. Contacted patient per Dr.Arfeen's instructions to inform her that she needs to be seen in the next 30 days to continue receiving medications.Patient verbalized understanding.

## 2011-12-12 DIAGNOSIS — IMO0001 Reserved for inherently not codable concepts without codable children: Secondary | ICD-10-CM | POA: Diagnosis not present

## 2011-12-12 DIAGNOSIS — J309 Allergic rhinitis, unspecified: Secondary | ICD-10-CM | POA: Diagnosis not present

## 2011-12-12 DIAGNOSIS — IMO0002 Reserved for concepts with insufficient information to code with codable children: Secondary | ICD-10-CM | POA: Diagnosis not present

## 2011-12-12 DIAGNOSIS — D649 Anemia, unspecified: Secondary | ICD-10-CM | POA: Diagnosis not present

## 2011-12-12 DIAGNOSIS — F329 Major depressive disorder, single episode, unspecified: Secondary | ICD-10-CM | POA: Diagnosis not present

## 2011-12-12 DIAGNOSIS — M2550 Pain in unspecified joint: Secondary | ICD-10-CM | POA: Diagnosis not present

## 2011-12-12 DIAGNOSIS — G479 Sleep disorder, unspecified: Secondary | ICD-10-CM | POA: Diagnosis not present

## 2011-12-12 DIAGNOSIS — E538 Deficiency of other specified B group vitamins: Secondary | ICD-10-CM | POA: Diagnosis not present

## 2011-12-17 DIAGNOSIS — Z1211 Encounter for screening for malignant neoplasm of colon: Secondary | ICD-10-CM | POA: Diagnosis not present

## 2011-12-18 ENCOUNTER — Other Ambulatory Visit: Payer: Self-pay | Admitting: Physician Assistant

## 2011-12-18 DIAGNOSIS — M1710 Unilateral primary osteoarthritis, unspecified knee: Secondary | ICD-10-CM | POA: Diagnosis not present

## 2011-12-18 DIAGNOSIS — M545 Low back pain: Secondary | ICD-10-CM

## 2011-12-18 DIAGNOSIS — IMO0001 Reserved for inherently not codable concepts without codable children: Secondary | ICD-10-CM | POA: Diagnosis not present

## 2011-12-18 DIAGNOSIS — M069 Rheumatoid arthritis, unspecified: Secondary | ICD-10-CM | POA: Diagnosis not present

## 2011-12-18 DIAGNOSIS — M7500 Adhesive capsulitis of unspecified shoulder: Secondary | ICD-10-CM | POA: Diagnosis not present

## 2011-12-29 ENCOUNTER — Ambulatory Visit
Admission: RE | Admit: 2011-12-29 | Discharge: 2011-12-29 | Disposition: A | Discharge status: Home / Self Care | Payer: Medicare Other | Source: Ambulatory Visit | Attending: Physician Assistant | Admitting: Physician Assistant

## 2011-12-29 DIAGNOSIS — M545 Low back pain: Secondary | ICD-10-CM

## 2011-12-29 DIAGNOSIS — M5126 Other intervertebral disc displacement, lumbar region: Secondary | ICD-10-CM | POA: Diagnosis not present

## 2011-12-29 DIAGNOSIS — M5137 Other intervertebral disc degeneration, lumbosacral region: Secondary | ICD-10-CM | POA: Diagnosis not present

## 2011-12-29 DIAGNOSIS — M47817 Spondylosis without myelopathy or radiculopathy, lumbosacral region: Secondary | ICD-10-CM | POA: Diagnosis not present

## 2011-12-30 ENCOUNTER — Telehealth (HOSPITAL_COMMUNITY): Payer: Self-pay | Admitting: *Deleted

## 2011-12-30 DIAGNOSIS — N3941 Urge incontinence: Secondary | ICD-10-CM | POA: Diagnosis not present

## 2011-12-30 NOTE — Telephone Encounter (Signed)
Opened in error

## 2011-12-31 ENCOUNTER — Ambulatory Visit (INDEPENDENT_AMBULATORY_CARE_PROVIDER_SITE_OTHER): Payer: Medicare Other | Admitting: Psychiatry

## 2011-12-31 ENCOUNTER — Encounter (HOSPITAL_COMMUNITY): Payer: Self-pay | Admitting: Psychiatry

## 2011-12-31 VITALS — BP 129/93 | HR 86 | Wt 301.0 lb

## 2011-12-31 DIAGNOSIS — F329 Major depressive disorder, single episode, unspecified: Secondary | ICD-10-CM

## 2011-12-31 MED ORDER — CLONAZEPAM 0.5 MG PO TABS: ORAL_TABLET | ORAL | Status: DC

## 2011-12-31 MED ORDER — ZIPRASIDONE HCL 60 MG PO CAPS: 60.0000 mg | ORAL_CAPSULE | Freq: Every day | ORAL | Status: DC

## 2011-12-31 NOTE — Progress Notes (Signed)
Chief complaint I am feeling very tired.     History of present illness Patient is 56 year old Caucasian divorced unemployed female who came for her followup appointment.  Patient was last seen in April .  She did not came for her last appointment due to excruciating knee pain and as per Dr. advice she could not walk.  She stopped taking saphris due to allergic reaction .  She also stop Seroquel believing it was causing reaction too.  She started to feel more depressed anxious and unable to sleep.  Though her knee pain.improved but she started to have shooting pain on her right .  She was given a steroid injection in any and recently had an MRI of back to rule out excruciating leg pain.  Patient is very worried about her physical illness.  She is frustrated with her physical condition and endorse hopelessness and helplessness.  Though she denies any active suicidal thoughts but endorse passive suicidal thinking and social isolation.  She cannot go anywhere due to leg pain.  She also has urge incontinence and requires to go back from many times.  She feel exhausted, restless and very tired.  His her does she went to see Dr. Volney American and his Dr. crying about her physical condition.  Her physician call us to set up appointment.  Patient is desperate to get some help and medicine for sleep.  She denies any paranoia and hallucination or any aggressive thinking.  She wants to get better .  She is taking her Lexapro , Klonopin and temazepam however she does not feel her sleep medicines is working very well.  She admitted most of her issues are physically related but she also feel medicine for her depression is not working very well.  She has lost weight since she stopped taking Seroquel.  She's watching her diet more carefully.  In the past she had tried Geodon with good response however she could not afford and then she tried Seroquel , Abilify and saphris.  She's not drinking or using any illegal substance.   She  continued to endorse guilty as she's not able to see her mother regularly.    Current psychiatric medication Klonopin 0.5 mg 1 to 2 tablet at bedtime Lexapro 20 mg daily Temazepam 15 mg at bedtime  Vitals Stable, weight 301 point.  She had lost 15 pounds from her last visit.Marland Kitchen  Past psychiatric history Patient told she has been depressed for almost 20 years. She had tried amitriptyline, nortriptyline, Remeron, Seroquel, Geodon, Abilify, Cymbalta, Prozac, Paxil, Zoloft, Wellbutrin, Effexor and lithium. She do not recall the effects of these medication but felt he today stopped working her having side effects. She denies any history of suicidal attempt or any inpatient psychiatric treatment. She also denied any history of mania or psychosis. She has been seeing Dr. Haskel Khan  In [place] for 20 years. She has not seen him in one year but getting prescription on the phone.   Family history Patient endorsed mother and father has history of depression  Psychosocial history Patient was born and raised in [place]. She married once but has no children.  Patient moved to [facility] to live close with her brother. Patient was very involved taking care of her mother who is now a nursing home in [place].  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  Medical history She see Johnn Hai at [facility] physician. Her last  blood work was done in July. Patient has history of arthritis, hypertension, UTI, fibromyalgia, chronic pain syndrome, GERD, knee pain and diabetes mellitus. Her last blood work which was done on April 17 shows hemoglobin A1c 6.0, her WBC and platelet for normal.  Her BUN cracking was normal.  Her blood glucose was 151.  Alcohol and substance use history Patient has a history of alcohol or substance use  Mental status examination Patient is morbid obese female who is casually dressed and well  groomed. She maintained fair eye contact. She is tearful and appeared very anxious.  She described her mood is depressed and sad and her affect is constricted.  She denies any active suicidal thinking or homicidal thinking however endorse passive suicidal thoughts.  She denies any auditory or visual hallucination.  There were no paranoia or delusion present at this time.  Her attention and concentration is fair.  She's alert oriented x3.  There were no tremors or shakes present at this time.  She has difficulty walking due to pain.  Her insight judgment and pulse control is fair.  Diagnosis Axis I Maj. depressive disorder Axis II deferred Axis III see medical history Axis IV other to moderate Axis V 55-60  Plan I I review her recent blood work, collateral information and recent progress note.  I do believe patient has been decompensating slowly.  She had a good response with Geodon.  I was able to arrange some samples her Geodon 60 mg .  I recommend to try Geodon 60 mg in the stopped taking temazepam .  Patient does not want to take any medication that cause further weight gain .  She also does not want any medication that is expansive.  In the past she had tried multiple psychiatric medication however they did work for a short pen of time and then they stop.  I recommend to try Geodon 60 mg and call us if she is any question or concern about the medication or if she feel worsening of the symptoms.  I also offer voluntary psychiatric inpatient treatment however patient declined.  She is scheduled to see therapist next Monday for coping and social skills.  I will see her again in 3 weeks.  Time spent 30 minutes.     Portion of this note is generated with voice recognition software and may contain typographical error.

## 2012-01-05 ENCOUNTER — Ambulatory Visit (INDEPENDENT_AMBULATORY_CARE_PROVIDER_SITE_OTHER): Payer: Medicare Other | Admitting: Licensed Clinical Social Worker

## 2012-01-05 ENCOUNTER — Encounter (HOSPITAL_COMMUNITY): Payer: Self-pay | Admitting: Licensed Clinical Social Worker

## 2012-01-05 DIAGNOSIS — F332 Major depressive disorder, recurrent severe without psychotic features: Secondary | ICD-10-CM | POA: Diagnosis not present

## 2012-01-05 NOTE — Progress Notes (Signed)
   THERAPIST PROGRESS NOTE  Session Time: 3:00pm-3:50pm  Participation Level: Active  Behavioral Response: Well GroomedAlertAnxious, Depressed and Irritable  Type of Therapy: Individual Therapy  Treatment Goals addressed: Coping  Interventions: CBT, Motivational Interviewing, Strength-based and Reframing  Summary: Jasmine Huff is a 56 y.o. female who presents with depressed mood and anxious/tearful affect. She reports finally being able to return to therapy now that she is no longer bed ridden due to knee pain. She endorses terrible chronic pain in her shoulders and systemically due to fibromyalga. She is trying to decide about having her shoulder replaced but wants to find out about pain down her leg first. Her insomnia continues and she does not want to take Geodon. She tried it for three days and she felt restless, agitated and anxious. It helped her sleep two out of three days, but it took her eight hours to fall asleep. She is frustrated and tearful about her inability to sleep and feels that "no one wants to help me". She is lonely, remains in the house all day long, often in bed watching TV. She wants to interact with others, but has felt too depressed to do so. Her appetite is wnl and she is pleased that she has lost 15 pounds.     Suicidal/Homicidal: Nowithout intent/plan  Therapist Response: Processed w/pt her feelings of frustration, agitation and depression. Her thinking is negative and rigid at times. She demonstrates learned helplessness and strong co-dependent behavioral patterns. Used CBT to assist patient with the identification of her negative distortions and irrational thoughts. Encouraged patient to verbalize alternative and factual responses which challenge her distortions. Discussed services offered at [facility] and encouraged patient to attend group therapy and the wellness academy. Used motivational interviewing to assist and encourage patient through the change process.  Explored patients barriers to change. Reviewed patients self care plan. Assessed her progress related to self care. Patient's self care is poor. Recommend proper diet, regular exercise, socialization and recreation.   Plan: Return again in two weeks.  Diagnosis: Axis I: Major Depression, Recurrent severe    Axis II: No diagnosis    Zabella Wease, LCSW 01/05/2012

## 2012-01-06 ENCOUNTER — Telehealth (HOSPITAL_COMMUNITY): Payer: Self-pay | Admitting: Licensed Clinical Social Worker

## 2012-01-06 ENCOUNTER — Other Ambulatory Visit (HOSPITAL_COMMUNITY): Payer: Self-pay | Admitting: Psychiatry

## 2012-01-06 ENCOUNTER — Telehealth (HOSPITAL_COMMUNITY): Payer: Self-pay | Admitting: Psychiatry

## 2012-01-06 NOTE — Telephone Encounter (Signed)
Returned patients call. Provided her information about Psych IOP after Dr. Lolly Mustache recommended it to her. Explained process. Patient is uncertain at this time if she will follow up with this program. Instructed patient to call this therapist before coming to be assessed for IOP.

## 2012-01-06 NOTE — Telephone Encounter (Signed)
Message copied by Cleotis Nipper on Tue Jan 06, 2012 12:44 PM ------      Message from: Remus Loffler      Created: Mon Jan 05, 2012  5:10 PM       Saw patient today. She does not like the Geodon and took it for three days. It caused her to feel agitated, anxious and restless. It took her eight hours to fall asleep after taking the Geodon. She would like to talk to you about alternative sleep medication and is very frustrated with her insomnia.

## 2012-01-13 ENCOUNTER — Other Ambulatory Visit (HOSPITAL_COMMUNITY): Payer: Self-pay | Admitting: *Deleted

## 2012-01-13 DIAGNOSIS — IMO0001 Reserved for inherently not codable concepts without codable children: Secondary | ICD-10-CM | POA: Diagnosis not present

## 2012-01-13 DIAGNOSIS — F329 Major depressive disorder, single episode, unspecified: Secondary | ICD-10-CM

## 2012-01-13 DIAGNOSIS — M1710 Unilateral primary osteoarthritis, unspecified knee: Secondary | ICD-10-CM | POA: Diagnosis not present

## 2012-01-13 DIAGNOSIS — M069 Rheumatoid arthritis, unspecified: Secondary | ICD-10-CM | POA: Diagnosis not present

## 2012-01-13 DIAGNOSIS — G8929 Other chronic pain: Secondary | ICD-10-CM | POA: Diagnosis not present

## 2012-01-13 MED ORDER — ESCITALOPRAM OXALATE 20 MG PO TABS: 30.0000 mg | ORAL_TABLET | Freq: Every day | ORAL | Status: DC

## 2012-01-15 ENCOUNTER — Other Ambulatory Visit: Payer: Self-pay | Admitting: Family Medicine

## 2012-01-16 ENCOUNTER — Ambulatory Visit (HOSPITAL_COMMUNITY): Payer: Self-pay | Admitting: Psychiatry

## 2012-01-19 ENCOUNTER — Ambulatory Visit (INDEPENDENT_AMBULATORY_CARE_PROVIDER_SITE_OTHER): Payer: Medicare Other | Admitting: Licensed Clinical Social Worker

## 2012-01-19 ENCOUNTER — Encounter (HOSPITAL_COMMUNITY): Payer: Self-pay | Admitting: Licensed Clinical Social Worker

## 2012-01-19 DIAGNOSIS — F332 Major depressive disorder, recurrent severe without psychotic features: Secondary | ICD-10-CM

## 2012-01-19 NOTE — Progress Notes (Signed)
   THERAPIST PROGRESS NOTE  Session Time: 3:00pm-3:50pm  Participation Level: Active  Behavioral Response: Well GroomedAlertAnxious  Type of Therapy: Individual Therapy  Treatment Goals addressed: Anxiety and Coping  Interventions: CBT, Motivational Interviewing, Supportive and Reframing  Summary: Jasmine Huff is a 56 y.o. female who presents with anxious mood and affect. She reports improvement in her depression, but an increase in anxiety. She constantly worries about what she would do if her brother dies. She is reliant upon him for many basic things and is tearful when processing her fear that she would become homeless. She wants to have a solution to decrease her anxiety, but has been unable to come up with one. She plans to attend a support group but needs to take care of doctor appointments first, as she does not have the energy to drive to [place] multiple times per week. She continues to report intolerable side effects from Geodon, which cause restlessness, increased anxiety and irritability. Her sleep is poor and her appetite is wnl.    Suicidal/Homicidal: Nowithout intent/plan  Therapist Response: Processed w/pt her anxiety about her dependence upon her brother. Explored solutions and patient becomes frustrated because there is no clear solution which she could afford. Her thinking is negative and irrational at times. Used CBT to assist patient with the identification of her negative distortions and irrational thoughts. Encouraged patient to verbalize alternative and factual responses which challenge her distortions. Her motivation for wellness has improved. She is willing to work on reframing her thoughts and verbalized reframing thoughts about loosing her brother by the end of session. Used motivational interviewing to assist and encourage patient through the change process. Explored patients barriers to change. Reviewed patients self care plan. Assessed her progress related to self  care. Patient's self care is improving. Recommend proper diet, regular exercise, socialization and recreation.   Plan: Return again in one weeks.  Diagnosis: Axis I: Major Depression, Recurrent severe    Axis II: No diagnosis    Jasmine Moulin, LCSW 01/19/2012

## 2012-01-20 ENCOUNTER — Telehealth (HOSPITAL_COMMUNITY): Payer: Self-pay | Admitting: *Deleted

## 2012-01-20 DIAGNOSIS — R350 Frequency of micturition: Secondary | ICD-10-CM | POA: Diagnosis not present

## 2012-01-20 DIAGNOSIS — N3941 Urge incontinence: Secondary | ICD-10-CM | POA: Diagnosis not present

## 2012-01-20 NOTE — Telephone Encounter (Signed)
See phone call note

## 2012-01-21 ENCOUNTER — Telehealth (HOSPITAL_COMMUNITY): Payer: Self-pay | Admitting: *Deleted

## 2012-01-21 DIAGNOSIS — M25519 Pain in unspecified shoulder: Secondary | ICD-10-CM | POA: Diagnosis not present

## 2012-01-21 DIAGNOSIS — M25549 Pain in joints of unspecified hand: Secondary | ICD-10-CM | POA: Diagnosis not present

## 2012-01-21 DIAGNOSIS — IMO0001 Reserved for inherently not codable concepts without codable children: Secondary | ICD-10-CM | POA: Diagnosis not present

## 2012-01-21 MED ORDER — TRAZODONE HCL 50 MG PO TABS: ORAL_TABLET | ORAL | Status: DC

## 2012-01-21 NOTE — Telephone Encounter (Signed)
Opened in error

## 2012-01-21 NOTE — Addendum Note (Signed)
Addended by: Tonny Bollman on: 01/21/2012 10:46 AM   Modules accepted: Orders

## 2012-01-23 ENCOUNTER — Encounter (HOSPITAL_COMMUNITY): Payer: Self-pay | Admitting: Psychiatry

## 2012-01-23 ENCOUNTER — Ambulatory Visit (INDEPENDENT_AMBULATORY_CARE_PROVIDER_SITE_OTHER): Payer: Medicare Other | Admitting: Psychiatry

## 2012-01-23 DIAGNOSIS — F329 Major depressive disorder, single episode, unspecified: Secondary | ICD-10-CM

## 2012-01-23 MED ORDER — CLONAZEPAM 0.5 MG PO TABS: 0.5000 mg | ORAL_TABLET | Freq: Every evening | ORAL | Status: DC | PRN

## 2012-01-23 MED ORDER — ESCITALOPRAM OXALATE 20 MG PO TABS: 30.0000 mg | ORAL_TABLET | Freq: Every day | ORAL | Status: DC

## 2012-01-23 MED ORDER — TRAZODONE HCL 100 MG PO TABS: 100.0000 mg | ORAL_TABLET | Freq: Every day | ORAL | Status: DC

## 2012-01-23 NOTE — Progress Notes (Signed)
Chief complaint I stopped taking Geodon as it was causing more anxious.  I like trazodone.      History of present illness Patient is 56 year old Caucasian divorced unemployed female who came for her followup appointment.  Patient now stopped taking Geodon.  Earlier she called and reported that Geodon was making her more anxious.  She was given trazodone which she likes it.  She sleeping better.  She has some anxiety but overall she denies any significant depression and crying spells.  She is taking Klonopin 0.5 mg as needed.  She is not taking any temazepam.  Recently she has given morphine for her pain.  She continues to have like pain and she realize that her back pain may remain unchanged but she is hoping that recent morphine sulfate may help some.  She denies any agitation anger mood swing.  She denies any recent paranoia or any feeling of hopelessness or helplessness.  She's not drinking or using any illegal substance.  She also likes her increase Lexapro.  Current psychiatric medication Klonopin 0.5 mg at bedtime Lexapro 30 mg daily Trazodone 50 mg 2 tablet at bedtime.  Past psychiatric history Patient told she has been depressed for almost 20 years. She had tried amitriptyline, nortriptyline, Remeron, Seroquel, Geodon, Abilify, Cymbalta, Prozac, Paxil, Zoloft, Wellbutrin, Effexor and lithium. She do not recall the effects of these medication but felt he today stopped working her having side effects. She denies any history of suicidal attempt or any inpatient psychiatric treatment. She also denied any history of mania or psychosis. She has been seeing Dr. Haskel Khan  In [place] for 20 years. She has not seen him in one year but getting prescription on the phone.   Family history Patient endorsed mother and father has history of depression  Psychosocial history Patient was born and raised in [place]. She married once but has no children.  Patient moved to [facility] to live  close with her brother. Patient was very involved taking care of her mother who is now a nursing home in [place].  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  Medical history She see Johnn Hai at [facility] physician. Her last blood work was done in July. Patient has history of arthritis, hypertension, UTI, fibromyalgia, chronic pain syndrome, GERD, knee pain and diabetes mellitus. Her last blood work which was done on April 17 shows hemoglobin A1c 6.0, her WBC and platelet for normal.  Her BUN cracking was normal.  Her blood glucose was 151.  Alcohol and substance use history Patient has a history of alcohol or substance use  Mental status examination Patient is morbid obese female who is casually dressed and well groomed. She maintained fair eye contact. She is calm and cooperative.  She is relevant in conversation.  She described her mood is better and her affect is improved from the past.  She denies any active or passive suicidal thinking and homicidal thinking.  She denies any auditory or visual hallucination.  There were no paranoia present.  Her attention and concentration is fair.  She is oriented x3.  Her insight judgment and pulse control is okay.  Diagnosis Axis I Maj. depressive disorder Axis II deferred Axis III see medical history Axis IV other to moderate Axis V 55-60  Plan At this time patient is doing better on trazodone with Lexapro and Klonopin.  I will continue her current psychiatric medication.  We will  discontinue temazepam and Geodon since patient is not taking.  I explained the risk and benefits of medication recommend to call us if she has any question or concern about the medication.  I will see her again in 4 weeks.  Portion of this note is generated with voice recognition software and may contain typographical error.

## 2012-01-27 ENCOUNTER — Ambulatory Visit (INDEPENDENT_AMBULATORY_CARE_PROVIDER_SITE_OTHER): Payer: Medicare Other | Admitting: Licensed Clinical Social Worker

## 2012-01-27 DIAGNOSIS — F332 Major depressive disorder, recurrent severe without psychotic features: Secondary | ICD-10-CM

## 2012-01-27 NOTE — Progress Notes (Signed)
   THERAPIST PROGRESS NOTE  Session Time: 4:00pm-4:50pm  Participation Level: Active  Behavioral Response: Well GroomedAlertAnxious and Depressed  Type of Therapy: Individual Therapy  Treatment Goals addressed: Coping  Interventions: CBT, DBT, Supportive and Reframing  Summary: Jasmine Huff is a 56 y.o. female who presents with anxious mood and tearful affect. She reports overwhelming and high anxiety, with frequent panic attacks. She is upset that she only has one Klonopin per day to take and is tearful when processing her frustration. The panic attacks are worse at night and she feels as though she cannot breath. She will sit up in bed and try deep breathing exercises but is frustrated because this does not work well for her. She has not been able to attend any support groups or classes at the [facility] due to intense pain. She often feels hopeless about the future of her life due to pain and limited positive responses from doctors. Her sleep remains poor due to frequent interruptions to urinate. Her appetite is poor.   Suicidal/Homicidal: Nowithout intent/plan  Therapist Response: Assessed patients current functioning and reviewed progress. Patient struggles with strong automatic, negative and distorted thinking. Used CBT to assist patient with the identification of her negative distortions and irrational thoughts. Encouraged patient to verbalize alternative and factual responses which challenge her distortions. She has difficulty tolerating uncomfortable emotions. Used DBT to assist her with distress tolerance, mindfulness and distraction techniques. Used motivational interviewing to assist and encourage patient through the change process. Explored patients barriers to change. Reviewed patients self care plan. Assessed her progress related to self care. Patient's self care is poor. Recommend proper diet, regular exercise, socialization and recreation.   Plan: Return again in one  weeks.  Diagnosis: Axis I: Major Depression, Recurrent severe    Axis II: No diagnosis    Jasmine Wineland, LCSW 01/27/2012

## 2012-02-03 ENCOUNTER — Ambulatory Visit (INDEPENDENT_AMBULATORY_CARE_PROVIDER_SITE_OTHER): Payer: Medicare Other | Admitting: Licensed Clinical Social Worker

## 2012-02-03 ENCOUNTER — Ambulatory Visit (HOSPITAL_COMMUNITY): Payer: Self-pay | Admitting: Licensed Clinical Social Worker

## 2012-02-03 DIAGNOSIS — F332 Major depressive disorder, recurrent severe without psychotic features: Secondary | ICD-10-CM | POA: Diagnosis not present

## 2012-02-03 NOTE — Progress Notes (Signed)
   THERAPIST PROGRESS NOTE  Session Time: 3:00pm-3:50pm  Participation Level: Active  Behavioral Response: Well GroomedAlertEuthymic  Type of Therapy: Individual Therapy  Treatment Goals addressed: Anxiety and Coping  Interventions: CBT, Supportive and Reframing  Summary: Jasmine Huff is a 56 y.o. female who presents with euthymic mood and bright affect. She reports continued nausea with stomach pain and cramping and plans to follow up with her doctor. Her energy level has decreased due to this and she is frustrated. She is pleased to report that her anxiety has decreased over the past week due to her efforts at redirecting her negative and catastrophic thoughts. She continues to sleep poorly and will see a sleep doctor tomorrow. She processes her frustration with her brothers antagonistic treatment and wants to learn how to deal with him and not feel hurt all the time. She wants her brother to provide her with emotional support that he is incapable of. She feels judged by his frequent negative comments about her weight and eating habits and wants a plan of action.    Suicidal/Homicidal: Nowithout intent/plan  Therapist Response: Assessed patients current functioning and reviewed progress. Patient demonstrates excellent progress and motivation to reframe her thinking. Processed her feelings towards her brother and unrealistic expectations, leading to chronic disappointment. Discussed expression of healthy boundaries and practiced assertive communication. Used CBT to assist patient with the identification of her negative distortions and irrational thoughts. Encouraged patient to verbalize alternative and factual responses which challenge her distortions. Used DBT to help patient develop mindfulness, use distraction techniques and learn distress tolerance. Reviewed patients self care plan. Assessed her progress related to self care. Patient's self care is poor. Recommend proper diet, regular  exercise, socialization and recreation.   Plan: Return again in one weeks.  Diagnosis: Axis I: Major Depression, Recurrent severe    Axis II: No diagnosis    Justyce Yeater, LCSW 02/03/2012

## 2012-02-04 DIAGNOSIS — G4721 Circadian rhythm sleep disorder, delayed sleep phase type: Secondary | ICD-10-CM | POA: Diagnosis not present

## 2012-02-04 DIAGNOSIS — G2581 Restless legs syndrome: Secondary | ICD-10-CM | POA: Diagnosis not present

## 2012-02-10 ENCOUNTER — Encounter (HOSPITAL_COMMUNITY): Payer: Self-pay | Admitting: Licensed Clinical Social Worker

## 2012-02-10 ENCOUNTER — Ambulatory Visit (INDEPENDENT_AMBULATORY_CARE_PROVIDER_SITE_OTHER): Payer: Medicare Other | Admitting: Licensed Clinical Social Worker

## 2012-02-10 DIAGNOSIS — F332 Major depressive disorder, recurrent severe without psychotic features: Secondary | ICD-10-CM

## 2012-02-10 NOTE — Progress Notes (Signed)
   THERAPIST PROGRESS NOTE  Session Time: 3:00pm-3:50pm  Participation Level: Active  Behavioral Response: Well GroomedAlertAnxious and Depressed  Type of Therapy: Individual Therapy  Treatment Goals addressed: Coping  Interventions: CBT, Motivational Interviewing, Supportive and Reframing  Summary: Jasmine Huff is a 56 y.o. female who presents with depressed mood and anxious affect. She reports increased fatigue and exhaustion and expresses her frustration with her chronic illnesses. She wants to talk about how to live with chronic disease because she says that she is not living, but just existing. She feels too tired to even make herself a piece of toast or a cup of tea. She has contacted her doctor and is waiting to be seen because her doctor is on vacation and she does not want to be seen by another doctor. She did follow through on increasing her attempts for socialization and spent time with friends. She received a phone call from the nursing home about her mother going into heart failure and panicked. She is angry over how they communicate concerns to her about her mother. Her sleep remains increased and her appetite is wnl.    Suicidal/Homicidal: Nowithout intent/plan  Therapist Response: Assessed patients current functioning and reviewed progress. Reviewed coping strategies. Assessed patients safety and assisted in identifying protective factors.  Reviewed crisis plan with patient. Assisted patient with the expression of her feelings of depression and frustration. Used CBT to assist patient with the identification of negative distortions and irrational thoughts. Encouraged patient to verbalize alternative and factual responses which challenge thought distortions. Used DBT to practice mindfulness, review distraction list and improve distress tolerance skills. Reviewed patients self care plan. Assessed  progress related to self care. Patient's self care is fair. Recommend proper diet,  regular exercise, socialization and recreation.   Plan: Return again in two weeks.  Diagnosis: Axis I: Major Depression, Recurrent severe    Axis II: No diagnosis    Eriyah Fernando, LCSW 02/10/2012

## 2012-02-16 DIAGNOSIS — Z79899 Other long term (current) drug therapy: Secondary | ICD-10-CM | POA: Diagnosis not present

## 2012-02-16 DIAGNOSIS — E119 Type 2 diabetes mellitus without complications: Secondary | ICD-10-CM | POA: Diagnosis not present

## 2012-02-17 ENCOUNTER — Ambulatory Visit (HOSPITAL_COMMUNITY): Payer: Self-pay | Admitting: Licensed Clinical Social Worker

## 2012-02-20 DIAGNOSIS — M1710 Unilateral primary osteoarthritis, unspecified knee: Secondary | ICD-10-CM | POA: Diagnosis not present

## 2012-02-20 DIAGNOSIS — G8929 Other chronic pain: Secondary | ICD-10-CM | POA: Diagnosis not present

## 2012-02-20 DIAGNOSIS — IMO0001 Reserved for inherently not codable concepts without codable children: Secondary | ICD-10-CM | POA: Diagnosis not present

## 2012-02-20 DIAGNOSIS — M069 Rheumatoid arthritis, unspecified: Secondary | ICD-10-CM | POA: Diagnosis not present

## 2012-02-23 ENCOUNTER — Inpatient Hospital Stay (HOSPITAL_COMMUNITY): Payer: Medicare Other

## 2012-02-23 ENCOUNTER — Inpatient Hospital Stay (HOSPITAL_COMMUNITY)
Admission: EM | Admit: 2012-02-23 | Discharge: 2012-02-26 | DRG: 683 | Disposition: A | Discharge status: Home / Self Care | Payer: Medicare Other | Attending: Internal Medicine | Admitting: Internal Medicine

## 2012-02-23 ENCOUNTER — Encounter (HOSPITAL_COMMUNITY): Payer: Self-pay | Admitting: Emergency Medicine

## 2012-02-23 ENCOUNTER — Emergency Department (HOSPITAL_COMMUNITY): Payer: Medicare Other

## 2012-02-23 DIAGNOSIS — Z6841 Body Mass Index (BMI) 40.0 and over, adult: Secondary | ICD-10-CM

## 2012-02-23 DIAGNOSIS — R5383 Other fatigue: Secondary | ICD-10-CM | POA: Diagnosis not present

## 2012-02-23 DIAGNOSIS — E860 Dehydration: Secondary | ICD-10-CM | POA: Diagnosis not present

## 2012-02-23 DIAGNOSIS — E119 Type 2 diabetes mellitus without complications: Secondary | ICD-10-CM

## 2012-02-23 DIAGNOSIS — R05 Cough: Secondary | ICD-10-CM | POA: Diagnosis not present

## 2012-02-23 DIAGNOSIS — F3289 Other specified depressive episodes: Secondary | ICD-10-CM | POA: Diagnosis present

## 2012-02-23 DIAGNOSIS — I959 Hypotension, unspecified: Secondary | ICD-10-CM | POA: Diagnosis not present

## 2012-02-23 DIAGNOSIS — R5381 Other malaise: Secondary | ICD-10-CM | POA: Diagnosis not present

## 2012-02-23 DIAGNOSIS — G894 Chronic pain syndrome: Secondary | ICD-10-CM | POA: Diagnosis present

## 2012-02-23 DIAGNOSIS — R6889 Other general symptoms and signs: Secondary | ICD-10-CM | POA: Diagnosis not present

## 2012-02-23 DIAGNOSIS — F329 Major depressive disorder, single episode, unspecified: Secondary | ICD-10-CM

## 2012-02-23 DIAGNOSIS — M129 Arthropathy, unspecified: Secondary | ICD-10-CM | POA: Diagnosis present

## 2012-02-23 DIAGNOSIS — Z7982 Long term (current) use of aspirin: Secondary | ICD-10-CM

## 2012-02-23 DIAGNOSIS — G4733 Obstructive sleep apnea (adult) (pediatric): Secondary | ICD-10-CM | POA: Diagnosis present

## 2012-02-23 DIAGNOSIS — R0902 Hypoxemia: Secondary | ICD-10-CM

## 2012-02-23 DIAGNOSIS — N289 Disorder of kidney and ureter, unspecified: Secondary | ICD-10-CM

## 2012-02-23 DIAGNOSIS — I10 Essential (primary) hypertension: Secondary | ICD-10-CM | POA: Diagnosis present

## 2012-02-23 DIAGNOSIS — F32A Depression, unspecified: Secondary | ICD-10-CM

## 2012-02-23 DIAGNOSIS — F332 Major depressive disorder, recurrent severe without psychotic features: Secondary | ICD-10-CM

## 2012-02-23 DIAGNOSIS — N179 Acute kidney failure, unspecified: Principal | ICD-10-CM

## 2012-02-23 DIAGNOSIS — R0602 Shortness of breath: Secondary | ICD-10-CM | POA: Diagnosis not present

## 2012-02-23 DIAGNOSIS — G4700 Insomnia, unspecified: Secondary | ICD-10-CM

## 2012-02-23 DIAGNOSIS — K219 Gastro-esophageal reflux disease without esophagitis: Secondary | ICD-10-CM

## 2012-02-23 DIAGNOSIS — E785 Hyperlipidemia, unspecified: Secondary | ICD-10-CM

## 2012-02-23 DIAGNOSIS — B370 Candidal stomatitis: Secondary | ICD-10-CM | POA: Diagnosis not present

## 2012-02-23 DIAGNOSIS — I517 Cardiomegaly: Secondary | ICD-10-CM | POA: Diagnosis not present

## 2012-02-23 DIAGNOSIS — E669 Obesity, unspecified: Secondary | ICD-10-CM

## 2012-02-23 DIAGNOSIS — M797 Fibromyalgia: Secondary | ICD-10-CM

## 2012-02-23 DIAGNOSIS — IMO0001 Reserved for inherently not codable concepts without codable children: Secondary | ICD-10-CM | POA: Diagnosis present

## 2012-02-23 DIAGNOSIS — R110 Nausea: Secondary | ICD-10-CM | POA: Diagnosis not present

## 2012-02-23 DIAGNOSIS — L0390 Cellulitis, unspecified: Secondary | ICD-10-CM

## 2012-02-23 DIAGNOSIS — R739 Hyperglycemia, unspecified: Secondary | ICD-10-CM

## 2012-02-23 DIAGNOSIS — G8929 Other chronic pain: Secondary | ICD-10-CM | POA: Diagnosis not present

## 2012-02-23 DIAGNOSIS — R404 Transient alteration of awareness: Secondary | ICD-10-CM | POA: Diagnosis not present

## 2012-02-23 DIAGNOSIS — R350 Frequency of micturition: Secondary | ICD-10-CM | POA: Diagnosis not present

## 2012-02-23 DIAGNOSIS — Z79899 Other long term (current) drug therapy: Secondary | ICD-10-CM | POA: Diagnosis not present

## 2012-02-23 LAB — URINALYSIS, ROUTINE W REFLEX MICROSCOPIC
Glucose, UA: NEGATIVE mg/dL
Hgb urine dipstick: NEGATIVE
Leukocytes, UA: NEGATIVE
Protein, ur: NEGATIVE mg/dL
Specific Gravity, Urine: 1.019 (ref 1.005–1.030)
pH: 5 (ref 5.0–8.0)

## 2012-02-23 LAB — CBC WITH DIFFERENTIAL/PLATELET
HCT: 35.2 % — ABNORMAL LOW (ref 36.0–46.0)
Hemoglobin: 11 g/dL — ABNORMAL LOW (ref 12.0–15.0)
Lymphocytes Relative: 10 % — ABNORMAL LOW (ref 12–46)
Monocytes Absolute: 0.9 10*3/uL (ref 0.1–1.0)
Monocytes Relative: 7 % (ref 3–12)
Neutro Abs: 11.4 10*3/uL — ABNORMAL HIGH (ref 1.7–7.7)
RBC: 3.9 MIL/uL (ref 3.87–5.11)
WBC: 13.7 10*3/uL — ABNORMAL HIGH (ref 4.0–10.5)

## 2012-02-23 LAB — CBC
HCT: 38.9 % (ref 36.0–46.0)
Hemoglobin: 12 g/dL (ref 12.0–15.0)
MCH: 28.2 pg (ref 26.0–34.0)
MCHC: 30.8 g/dL (ref 30.0–36.0)

## 2012-02-23 LAB — COMPREHENSIVE METABOLIC PANEL
ALT: 9 U/L (ref 0–35)
AST: 11 U/L (ref 0–37)
Albumin: 3.3 g/dL — ABNORMAL LOW (ref 3.5–5.2)
Calcium: 8.9 mg/dL (ref 8.4–10.5)
Creatinine, Ser: 2.12 mg/dL — ABNORMAL HIGH (ref 0.50–1.10)
Sodium: 136 mEq/L (ref 135–145)

## 2012-02-23 LAB — TROPONIN I: Troponin I: <0.3 ng/mL (ref <0.30)

## 2012-02-23 LAB — GLUCOSE, CAPILLARY: Glucose-Capillary: 108 mg/dL — ABNORMAL HIGH (ref 70–99)

## 2012-02-23 MED ORDER — PANTOPRAZOLE SODIUM 40 MG PO TBEC
40.0000 mg | DELAYED_RELEASE_TABLET | Freq: Every day | ORAL | Status: DC
  Administered 2012-02-24 – 2012-02-25 (×2): 40 mg via ORAL
  Filled 2012-02-23 (×2): qty 1

## 2012-02-23 MED ORDER — FAMOTIDINE 20 MG PO TABS
20.0000 mg | ORAL_TABLET | Freq: Two times a day (BID) | ORAL | Status: DC
  Administered 2012-02-23 – 2012-02-26 (×6): 20 mg via ORAL
  Filled 2012-02-23 (×7): qty 1

## 2012-02-23 MED ORDER — ASPIRIN EC 81 MG PO TBEC
81.0000 mg | DELAYED_RELEASE_TABLET | Freq: Every day | ORAL | Status: DC
  Administered 2012-02-24 – 2012-02-26 (×3): 81 mg via ORAL
  Filled 2012-02-23 (×4): qty 1

## 2012-02-23 MED ORDER — LORATADINE 10 MG PO TABS
10.0000 mg | ORAL_TABLET | Freq: Every day | ORAL | Status: DC
  Administered 2012-02-24 – 2012-02-26 (×3): 10 mg via ORAL
  Filled 2012-02-23 (×4): qty 1

## 2012-02-23 MED ORDER — ONDANSETRON HCL 4 MG/2ML IJ SOLN
4.0000 mg | Freq: Once | INTRAMUSCULAR | Status: AC
  Administered 2012-02-23: 4 mg via INTRAVENOUS
  Filled 2012-02-23: qty 2

## 2012-02-23 MED ORDER — SODIUM CHLORIDE 0.9 % IJ SOLN: 3.0000 mL | Freq: Two times a day (BID) | INTRAMUSCULAR | Status: DC

## 2012-02-23 MED ORDER — ONDANSETRON HCL 4 MG PO TABS: 4.0000 mg | ORAL_TABLET | Freq: Four times a day (QID) | ORAL | Status: DC | PRN

## 2012-02-23 MED ORDER — SODIUM CHLORIDE 0.9 % IV SOLN: 250.0000 mL | INTRAVENOUS | Status: DC | PRN

## 2012-02-23 MED ORDER — SODIUM CHLORIDE 0.9 % IJ SOLN: 3.0000 mL | INTRAMUSCULAR | Status: DC | PRN

## 2012-02-23 MED ORDER — OXYCODONE HCL 5 MG PO TABS
5.0000 mg | ORAL_TABLET | ORAL | Status: DC | PRN
  Administered 2012-02-24: 5 mg via ORAL
  Filled 2012-02-23: qty 1

## 2012-02-23 MED ORDER — INSULIN ASPART 100 UNIT/ML ~~LOC~~ SOLN
0.0000 [IU] | Freq: Three times a day (TID) | SUBCUTANEOUS | Status: DC
  Administered 2012-02-24 (×2): 1 [IU] via SUBCUTANEOUS

## 2012-02-23 MED ORDER — TRAZODONE HCL 100 MG PO TABS
100.0000 mg | ORAL_TABLET | Freq: Every day | ORAL | Status: DC
  Administered 2012-02-23 – 2012-02-25 (×3): 100 mg via ORAL
  Filled 2012-02-23 (×4): qty 1

## 2012-02-23 MED ORDER — MORPHINE SULFATE ER 15 MG PO TBCR
60.0000 mg | EXTENDED_RELEASE_TABLET | Freq: Every day | ORAL | Status: DC
  Administered 2012-02-24 – 2012-02-26 (×3): 60 mg via ORAL
  Filled 2012-02-23 (×3): qty 4

## 2012-02-23 MED ORDER — SODIUM CHLORIDE 0.9 % IV BOLUS (SEPSIS)
1000.0000 mL | Freq: Once | INTRAVENOUS | Status: AC
  Administered 2012-02-23: 1000 mL via INTRAVENOUS

## 2012-02-23 MED ORDER — ONDANSETRON HCL 4 MG/2ML IJ SOLN
4.0000 mg | Freq: Four times a day (QID) | INTRAMUSCULAR | Status: DC | PRN
  Administered 2012-02-24: 4 mg via INTRAVENOUS
  Filled 2012-02-23: qty 2

## 2012-02-23 MED ORDER — CLONAZEPAM 0.5 MG PO TABS
0.5000 mg | ORAL_TABLET | Freq: Every evening | ORAL | Status: DC | PRN
  Administered 2012-02-25 – 2012-02-26 (×2): 0.5 mg via ORAL
  Filled 2012-02-23 (×2): qty 1

## 2012-02-23 MED ORDER — HEPARIN SODIUM (PORCINE) 5000 UNIT/ML IJ SOLN
5000.0000 [IU] | Freq: Three times a day (TID) | INTRAMUSCULAR | Status: DC
  Administered 2012-02-23 – 2012-02-26 (×8): 5000 [IU] via SUBCUTANEOUS
  Filled 2012-02-23 (×11): qty 1

## 2012-02-23 MED ORDER — ACETAMINOPHEN 325 MG PO TABS: 650.0000 mg | ORAL_TABLET | Freq: Four times a day (QID) | ORAL | Status: DC | PRN

## 2012-02-23 MED ORDER — SODIUM POLYSTYRENE SULFONATE 15 GM/60ML PO SUSP
15.0000 g | Freq: Once | ORAL | Status: AC
  Administered 2012-02-23: 15 g via ORAL
  Filled 2012-02-23: qty 60

## 2012-02-23 MED ORDER — FLUCONAZOLE 100 MG PO TABS
100.0000 mg | ORAL_TABLET | Freq: Every day | ORAL | Status: DC
  Administered 2012-02-24 – 2012-02-26 (×3): 100 mg via ORAL
  Filled 2012-02-23 (×3): qty 1

## 2012-02-23 MED ORDER — ACETAMINOPHEN 650 MG RE SUPP: 650.0000 mg | Freq: Four times a day (QID) | RECTAL | Status: DC | PRN

## 2012-02-23 MED ORDER — SODIUM CHLORIDE 0.9 % IV SOLN
INTRAVENOUS | Status: DC
  Administered 2012-02-23: 19:00:00 via INTRAVENOUS
  Administered 2012-02-24: 75 mL/h via INTRAVENOUS
  Administered 2012-02-24 – 2012-02-26 (×4): via INTRAVENOUS

## 2012-02-23 MED ORDER — ESCITALOPRAM OXALATE 20 MG PO TABS
30.0000 mg | ORAL_TABLET | Freq: Every day | ORAL | Status: DC
  Administered 2012-02-24 – 2012-02-26 (×3): 30 mg via ORAL
  Filled 2012-02-23 (×4): qty 1

## 2012-02-23 MED ORDER — SODIUM CHLORIDE 0.9 % IV BOLUS (SEPSIS)
500.0000 mL | Freq: Once | INTRAVENOUS | Status: AC
  Administered 2012-02-23: 500 mL via INTRAVENOUS

## 2012-02-23 MED ORDER — HYDROXYCHLOROQUINE SULFATE 200 MG PO TABS
200.0000 mg | ORAL_TABLET | Freq: Two times a day (BID) | ORAL | Status: DC
  Administered 2012-02-23 – 2012-02-26 (×6): 200 mg via ORAL
  Filled 2012-02-23 (×7): qty 1

## 2012-02-23 MED ORDER — SODIUM CHLORIDE 0.9 % IV BOLUS (SEPSIS): 1000.0000 mL | Freq: Once | INTRAVENOUS | Status: DC

## 2012-02-23 NOTE — ED Notes (Signed)
Pt undressed, in gown, on monitor, continuous pulse oximetry, blood pressure cuff and oxygen Jasmine Huff (2L) 

## 2012-02-23 NOTE — ED Notes (Signed)
Diabetic tray ordered

## 2012-02-23 NOTE — ED Provider Notes (Signed)
History     CSN: [id number]  Arrival date & time 02/23/12  1255   First MD Initiated Contact with Patient 02/23/12 1327      Chief Complaint  Patient presents with  . Weakness    (Consider location/radiation/quality/duration/timing/severity/associated sxs/prior treatment) Patient is a 56 y.o. female presenting with weakness. The history is provided by the patient. No language interpreter was used.  Weakness The primary symptoms include dizziness and nausea. Primary symptoms do not include headaches, syncope, loss of consciousness, visual change, focal weakness, fever or vomiting. The symptoms began more than 1 week ago. The symptoms are worsening. The neurological symptoms are diffuse.  Dizziness also occurs with nausea and weakness. Dizziness does not occur with vomiting.   Additional symptoms include weakness, loss of balance and anxiety. Additional symptoms do not include pain or lower back pain. Medical issues also include diabetes and hypertension. Medical issues do not include seizures, cerebral vascular accident, cancer, alcohol use or drug use.   55yo morbidly obese female here today coming from Dr. Lolly Mustache her psychiatrists' office by EMS with c/o generalized weakness all over and hypotension/hypoxemia.   Patient's initial O2 sat was 79 here in the ER she was placed on 3 L cannula and O2 sat came up to 96%. Initial blood pressure in the ER was 105/55. Before she left the office her blood pressure was 90/40. In the office her O2 sat was 74%. Patient states that she has not eaten or drinking for a month consistently because she wants to loose weight.  She is a chronic pain patient her last pain medicine was morphine Contin 60 mg last night around 12. She's also has pmh hypertension and diabetes, fibromyalgia, depression, migraines, UTI, chronic pain.  Does not look toxic or SOB presently.  Patient seems depressed and she is teary-eyed. Patient states that she often has the same symptoms  when she has a urinary tract infection.  Past Medical History  Diagnosis Date  . Arthritis   . Depression   . Allergy   . Hypertension   . Migraine   . UTI (lower urinary tract infection)   . Fibromyalgia   . Chronic pain syndrome   . GERD (gastroesophageal reflux disease) 01/21/2011  . Anxiety   . Panic attacks   . High blood pressure   . Knee pain   . Urination frequency     Past Surgical History  Procedure Date  . Cholecystectomy   . Abdominal hysterectomy   . Right ovary removal     Family History  Problem Relation Age of Onset  . Depression Mother   . Depression Father   . Alcohol abuse Brother   . Depression Brother   . Depression Maternal Grandfather   . Depression Other     History  Substance Use Topics  . Smoking status: Never Smoker   . Smokeless tobacco: Never Used  . Alcohol Use: No    OB History    Grav Para Term Preterm Abortions TAB SAB Ect Mult Living                  Review of Systems  Constitutional: Negative for fever.  HENT: Negative.   Eyes: Negative.   Respiratory: Negative.   Cardiovascular: Negative.  Negative for chest pain, palpitations, leg swelling and syncope.  Gastrointestinal: Positive for nausea. Negative for vomiting, abdominal pain and diarrhea.  Genitourinary: Negative for vaginal discharge.  Neurological: Positive for dizziness, weakness and loss of balance. Negative for focal weakness, loss  of consciousness and headaches.  Psychiatric/Behavioral: Negative.   All other systems reviewed and are negative.    Allergies  Pregabalin; Saphris; and Iodinated diagnostic agents  Home Medications   Current Outpatient Rx  Name Route Sig Dispense Refill  . ASPIRIN EC 81 MG PO TBEC Oral Take 81 mg by mouth daily.    Marland Kitchen CETIRIZINE HCL 10 MG PO TABS Oral Take 10 mg by mouth at bedtime.     Marland Kitchen CLONAZEPAM 0.5 MG PO TABS Oral Take 1 tablet (0.5 mg total) by mouth at bedtime as needed for anxiety. 30 tablet 0  . ESCITALOPRAM  OXALATE 20 MG PO TABS Oral Take 1.5 tablets (30 mg total) by mouth daily. 45 tablet 0  . FAMOTIDINE 20 MG PO TABS Oral Take 1 tablet (20 mg total) by mouth 2 (two) times daily. 30 tablet 0  . HYDROXYCHLOROQUINE SULFATE 200 MG PO TABS Oral Take 200 mg by mouth 2 (two) times daily.     Marland Kitchen LISINOPRIL-HYDROCHLOROTHIAZIDE 20-12.5 MG PO TABS Oral Take 1 tablet by mouth daily. 30 tablet 11  . METFORMIN HCL ER 500 MG PO TB24 Oral Take 500 mg by mouth at bedtime.    . METHOCARBAMOL 500 MG PO TABS Oral Take 500 mg by mouth daily as needed. For muscle spasms    . MORPHINE SULFATE ER 60 MG PO TBCR      . OPANA ER (CRUSH RESISTANT) 10 MG PO TB12 Oral Take 10-20 mg by mouth every 12 (twelve) hours.     . OXYCODONE-ACETAMINOPHEN 10-325 MG PO TABS Oral Take 1 tablet by mouth every 4 (four) hours as needed. For pain    . PRILOSEC 40 MG PO CPDR  TAKE (1) CAPSULE DAILY 90 each 0    Pt will need return OV before anymore refills.  Pl ...  . TRAZODONE HCL 100 MG PO TABS Oral Take 1 tablet (100 mg total) by mouth at bedtime. 30 tablet 0    BP 105/55  Pulse 82  Temp 98.1 F (36.7 C) (Oral)  Resp 18  SpO2 96%  Physical Exam  Nursing note and vitals reviewed. Constitutional: She is oriented to person, place, and time. She appears well-developed and well-nourished.       Obese morbidly  HENT:  Head: Normocephalic and atraumatic.  Eyes: Conjunctivae and EOM are normal. Pupils are equal, round, and reactive to light.  Neck: Normal range of motion. Neck supple.  Cardiovascular: Normal rate, regular rhythm and normal heart sounds.   No murmur heard. Pulmonary/Chest: Effort normal. No apnea. No respiratory distress. She has decreased breath sounds in the right lower field and the left lower field.  Abdominal: Soft. Bowel sounds are normal.  Musculoskeletal: Normal range of motion. She exhibits no edema and no tenderness.  Neurological: She is alert and oriented to person, place, and time. She has normal reflexes.    Skin: Skin is warm and dry.  Psychiatric: Judgment and thought content normal. Her mood appears anxious. Her speech is delayed. She is slowed. Cognition and memory are normal.    ED Course  Procedures (including critical care time)   Reassessment:   Patient has received 2/ 500 cc boluses in the ER which has kept  her systolic blood pressure in the 90s. Prior to boluses patient's systolic blood pressure was in the 80s. Patient is not in CHF per chest x-ray,  despite her BNP of 1049.0. Chest x-ray does not show CHF. Potassium is 5.7. BUN is 41, creatinine is 2.12.  GFR is 29. White blood cell count is 13.7 , hemoglobin is 11.0. Chest x-ray shows cardiomegaly with mild bronchitic changes.    Labs Reviewed  CBC WITH DIFFERENTIAL  COMPREHENSIVE METABOLIC PANEL  PRO B NATRIURETIC PEPTIDE  URINALYSIS, ROUTINE W REFLEX MICROSCOPIC   No results found.   No diagnosis found.    MDM   55yo obese female with hypoxia and hypotension on admission to ER from her psychiatrist office. Last blood pressure was 110/83 O2 sat was 98 on 3 L heart rate 72 and she is afebrile. Nausea has resolved. Patient has received another 1500 cc bolus which has helped her blood pressure,. Plan to admit her to a telemetry bed per Dr. Ethelle Lyon would like to see her first. We'll hold off putting orders in that she will be admitted after he assessed her.         Remi Haggard, NP 02/23/12 1552

## 2012-02-23 NOTE — H&P (Signed)
PCP:   [name], MD   Chief Complaint:  Weakness, shortness of breath, low oxygen saturation and low BP.   HPI: This is a 56 year old female, with history of Anxiety, panic attacks, depression, fibromyalgia syndrome, migraines, chronic pain syndrome, GERD, HTN, OA, DM-2, obesity, sent over prom her PMD's office to ED, because of a low blood pressure of 90/40 and low SPO2 of 74% on RA. According to patient, she had experienced progressive weakness for the past 2 months or so, has generalized weakness and feels more fatigued than she could ascribe to he fibromyalgia. She therefore, went to see her primary care physician today. On initial evaluation in the ED, she was found to have SBP in the 80s, and was managed with a 2 Liter bolus of NS, with improvement. She has also required oxygen supplementation, to keep saturations in the 90s. Per DE PA, she was somnolent initally, falling asleep during conversation, but she is now alert.    Allergies:   Allergies  Allergen Reactions  . Pregabalin Rash    Alters Mental status, flushing  . Saphris (Asenapine Maleate) Shortness Of Breath and Other (See Comments)    Lips tingle and tongue becomes numb/Akasthesia  . Iodinated Diagnostic Agents     Dye used for CT scans, severe hives      Past Medical History  Diagnosis Date  . Arthritis   . Depression   . Allergy   . Hypertension   . Migraine   . UTI (lower urinary tract infection)   . Fibromyalgia   . Chronic pain syndrome   . GERD (gastroesophageal reflux disease) 01/21/2011  . Anxiety   . Panic attacks   . High blood pressure   . Knee pain   . Urination frequency     Past Surgical History  Procedure Date  . Cholecystectomy   . Abdominal hysterectomy   . Right ovary removal     Prior to Admission medications   Medication Sig Start Date End Date Taking? Authorizing Provider  aspirin EC 81 MG tablet Take 81 mg by mouth daily.   Yes Historical Provider, MD  cetirizine  (ZYRTEC) 10 MG tablet Take 10 mg by mouth at bedtime.    Yes Historical Provider, MD  clonazePAM (KLONOPIN) 0.5 MG tablet Take 0.5 mg by mouth at bedtime as needed. For anxiety/rest   Yes Historical Provider, MD  escitalopram (LEXAPRO) 20 MG tablet Take 1.5 tablets (30 mg total) by mouth daily. 01/23/12 04/22/12 Yes Cleotis Nipper, MD  famotidine (PEPCID) 20 MG tablet Take 1 tablet (20 mg total) by mouth 2 (two) times daily. 10/27/11 10/26/12 Yes Shelda Jakes, MD  hydroxychloroquine (PLAQUENIL) 200 MG tablet Take 200 mg by mouth 2 (two) times daily.  08/04/11  Yes Historical Provider, MD  lisinopril-hydrochlorothiazide (PRINZIDE,ZESTORETIC) 20-12.5 MG per tablet Take 1 tablet by mouth daily. 03/26/11  Yes Kristian Covey, MD  metFORMIN (GLUCOPHAGE-XR) 500 MG 24 hr tablet Take 500 mg by mouth every evening.   Yes Historical Provider, MD  morphine (MS CONTIN) 60 MG 12 hr tablet Take 60 mg by mouth daily.  01/22/12  Yes Historical Provider, MD  omeprazole (PRILOSEC) 40 MG capsule Take 40 mg by mouth daily.   Yes Historical Provider, MD  oxyCODONE-acetaminophen (PERCOCET) 10-325 MG per tablet Take 1 tablet by mouth every 4 (four) hours as needed. For pain   Yes Historical Provider, MD  traZODone (DESYREL) 100 MG tablet Take 1 tablet (100 mg total) by mouth at  bedtime. 01/23/12  Yes Cleotis Nipper, MD    Social History: Patient has been on disability for about 2 years, single, has no offspring. She reports that she has never smoked. She has never used smokeless tobacco. She reports that she does not drink alcohol or use illicit drugs.  Family History  Problem Relation Age of Onset  . Depression Mother   . Depression Father   . Alcohol abuse Brother   . Depression Brother   . Depression Maternal Grandfather   . Depression Other    Father died of DM complications at age 60 years, he was hypertensive, and had heart disease. Mother is 40 years old, with HTN, heart problems and kidney problems.      Review of Systems:  As per HPI and chief complaint. Patent denies diminished appetite, weight loss, fever, chills, headache, blurred vision, difficulty in speaking, dysphagia, chest pain, cough, shortness of breath, orthopnea, paroxysmal nocturnal dyspnea, nausea, diaphoresis, abdominal pain, vomiting, diarrhea, belching, heartburn, hematemesis, melena, dysuria or nocturia. She does have urinary frequency, but no hematochezia, lower extremity swelling, pain, or redness. The rest of the systems review is negative.  Physical Exam:  General:  Patient does not appear to be in obvious acute distress. Alert, communicative, fully oriented, talking in complete sentences, not short of breath at rest.  HEENT:  Mild clinical pallor, no jaundice, no conjunctival injection or discharge. Buccal mucosa appears "dry", and oral thrush is evident. NECK:  Supple, JVP not seen, no carotid bruits, no palpable lymphadenopathy, no palpable goiter. CHEST:  Clinically clear to auscultation, no wheezes, no crackles. HEART:  Sounds 1 and 2 heard, normal, regular, no murmurs. ABDOMEN:  Morbidly obese, soft, non-tender, no palpable organomegaly, no palpable masses, normal bowel sounds. GENITALIA:  Not examined. LOWER EXTREMITIES:  No pitting edema, palpable peripheral pulses. MUSCULOSKELETAL SYSTEM:  Unremarkable. CENTRAL NERVOUS SYSTEM:  No focal neurologic deficit on gross examination.  Labs on Admission:  Results for orders placed during the hospital encounter of 02/23/12 (from the past 48 hour(s))  CBC WITH DIFFERENTIAL     Status: Abnormal   Collection Time   02/23/12  1:22 PM      Component Value Range Comment   WBC 13.7 (*) 4.0 - 10.5 K/uL    RBC 3.90  3.87 - 5.11 MIL/uL    Hemoglobin 11.0 (*) 12.0 - 15.0 g/dL    HCT 96.0 (*) 45.4 - 46.0 %    MCV 90.3  78.0 - 100.0 fL    MCH 28.2  26.0 - 34.0 pg    MCHC 31.3  30.0 - 36.0 g/dL    RDW 09.8  11.9 - 14.7 %    Platelets 277  150 - 400 K/uL    Neutrophils  Relative 83 (*) 43 - 77 %    Neutro Abs 11.4 (*) 1.7 - 7.7 K/uL    Lymphocytes Relative 10 (*) 12 - 46 %    Lymphs Abs 1.4  0.7 - 4.0 K/uL    Monocytes Relative 7  3 - 12 %    Monocytes Absolute 0.9  0.1 - 1.0 K/uL    Eosinophils Relative 0  0 - 5 %    Eosinophils Absolute 0.0  0.0 - 0.7 K/uL    Basophils Relative 0  0 - 1 %    Basophils Absolute 0.0  0.0 - 0.1 K/uL   COMPREHENSIVE METABOLIC PANEL     Status: Abnormal   Collection Time   02/23/12  1:22 PM  Component Value Range Comment   Sodium 136  135 - 145 mEq/L    Potassium 5.7 (*) 3.5 - 5.1 mEq/L    Chloride 99  96 - 112 mEq/L    CO2 32  19 - 32 mEq/L    Glucose, Bld 124 (*) 70 - 99 mg/dL    BUN 41 (*) 6 - 23 mg/dL    Creatinine, Ser 1.61 (*) 0.50 - 1.10 mg/dL    Calcium 8.9  8.4 - 09.6 mg/dL    Total Protein 6.7  6.0 - 8.3 g/dL    Albumin 3.3 (*) 3.5 - 5.2 g/dL    AST 11  0 - 37 U/L    ALT 9  0 - 35 U/L    Alkaline Phosphatase 82  39 - 117 U/L    Total Bilirubin 0.2 (*) 0.3 - 1.2 mg/dL    GFR calc non Af Amer 25 (*) >90 mL/min    GFR calc Af Amer 29 (*) >90 mL/min   PRO B NATRIURETIC PEPTIDE     Status: Abnormal   Collection Time   02/23/12  1:23 PM      Component Value Range Comment   Pro B Natriuretic peptide (BNP) 1049.0 (*) 0 - 125 pg/mL   TROPONIN I     Status: Normal   Collection Time   02/23/12  1:23 PM      Component Value Range Comment   Troponin I <0.30  <0.30 ng/mL   GLUCOSE, CAPILLARY     Status: Abnormal   Collection Time   02/23/12  2:09 PM      Component Value Range Comment   Glucose-Capillary 108 (*) 70 - 99 mg/dL   URINALYSIS, ROUTINE W REFLEX MICROSCOPIC     Status: Abnormal   Collection Time   02/23/12  2:41 PM      Component Value Range Comment   Color, Urine YELLOW  YELLOW    APPearance HAZY (*) CLEAR    Specific Gravity, Urine 1.019  1.005 - 1.030    pH 5.0  5.0 - 8.0    Glucose, UA NEGATIVE  NEGATIVE mg/dL    Hgb urine dipstick NEGATIVE  NEGATIVE    Bilirubin Urine NEGATIVE  NEGATIVE     Ketones, ur NEGATIVE  NEGATIVE mg/dL    Protein, ur NEGATIVE  NEGATIVE mg/dL    Urobilinogen, UA 0.2  0.0 - 1.0 mg/dL    Nitrite NEGATIVE  NEGATIVE    Leukocytes, UA NEGATIVE  NEGATIVE MICROSCOPIC NOT DONE ON URINES WITH NEGATIVE PROTEIN, BLOOD, LEUKOCYTES, NITRITE, OR GLUCOSE <1000 mg/dL.    Radiological Exams on Admission: *RADIOLOGY REPORT*  Clinical Data: Weakness, low blood pressure.  PORTABLE CHEST - 1 VIEW  Comparison: 10/27/2011  Findings: Mild cardiomegaly. Mild peribronchial thickening. No confluent opacities or effusions. No overt edema. No acute bony abnormality.  IMPRESSION: Cardiomegaly. Mild bronchitic changes.   Original Report Authenticated By: Cyndie Chime, M.D.    Assessment/Plan Active Problems:  1. Dehydration: Patient presented with progressive weakness, and was found to be hypotensive, with SBP in the 80s-90s, and with an elevated BUN/Creatinine ratio. These findings are consistent with dehydration/volume depletion. Fortunately, she has responded to aggressive fluid boluses, administered in the ED. We shall continue ivi fluids, and monitor BP. Diuretics, will of course, be discontinued.   2. ARF (acute renal failure): Creatinine is 2.12 today, with BUN of 41, against a known baseline creatinine of 0.80 documented on 10/27/11. These findings are consistent with ARF, and is likely  to be pre-renal, secondary to dehydration, against a background of continuing diuretic and ACE-i therapy. These culprit medications will be discontinued, and patient, managed with iv fluids. We shall follow renal indices, and arrange renal ultrasound. Metformin will also, be placed on hold. Improvement is anticipated.  3. Oral thrush: this was an incidental finding on physical examination. Patient will be managed with a 7-day course of oral diflucan.  4. DM: This is type 2, and appears well controlled, based on random blood glucose. We shall manage with diet, and SSI. As mentioned  above, Metformin will be on hold for now, due to ARF.  5: Hypoxia: Patient was found to have oxygen saturations in the 70s, on initial evaluation, and this has improved to 97%-99% on 2L. Patient is morbidly obese, was hypersomnolent on arrival, and CXR is devoid of acute pathology. Suspect OSA/OHS. Will continue oxygen supplementation for now, but patient will likely benefit from PSG, following discharge.  6. Chronic pain: Controlled on pre-admission opioid medication.  7. Anxiety/Depression/Fibromyalgia; Stable.   Further management will depend on clinical course.   Time Spent on Admission: 45 mins.   Vayden Weinand,CHRISTOPHER 02/23/2012, 4:22 PM

## 2012-02-23 NOTE — ED Provider Notes (Signed)
Medical screening examination/treatment/procedure(s) were performed by non-physician practitioner and as supervising physician I was immediately available for consultation/collaboration.   Lyanne Co, MD 02/23/12 2128

## 2012-02-23 NOTE — ED Notes (Signed)
Pt c/o generalized weakness for the last several months. Today seen by PCP and found hypotensive 90/40 and low SPO2 of 74%RA, rales in bases. Pt c/o recent frequency of urination. 24g rhand. 153 CBG

## 2012-02-24 ENCOUNTER — Inpatient Hospital Stay (HOSPITAL_COMMUNITY): Payer: Medicare Other

## 2012-02-24 DIAGNOSIS — R0902 Hypoxemia: Secondary | ICD-10-CM

## 2012-02-24 LAB — CBC
Hemoglobin: 10.4 g/dL — ABNORMAL LOW (ref 12.0–15.0)
MCH: 27.5 pg (ref 26.0–34.0)
MCV: 91.3 fL (ref 78.0–100.0)
RBC: 3.78 MIL/uL — ABNORMAL LOW (ref 3.87–5.11)

## 2012-02-24 LAB — COMPREHENSIVE METABOLIC PANEL
ALT: 8 U/L (ref 0–35)
CO2: 27 mEq/L (ref 19–32)
Calcium: 8.5 mg/dL (ref 8.4–10.5)
Creatinine, Ser: 1.88 mg/dL — ABNORMAL HIGH (ref 0.50–1.10)
GFR calc Af Amer: 34 mL/min — ABNORMAL LOW (ref >90)
GFR calc non Af Amer: 29 mL/min — ABNORMAL LOW (ref >90)
Glucose, Bld: 110 mg/dL — ABNORMAL HIGH (ref 70–99)

## 2012-02-24 LAB — GLUCOSE, CAPILLARY
Glucose-Capillary: 127 mg/dL — ABNORMAL HIGH (ref 70–99)
Glucose-Capillary: 130 mg/dL — ABNORMAL HIGH (ref 70–99)

## 2012-02-24 MED ORDER — TECHNETIUM TO 99M ALBUMIN AGGREGATED
3.0000 | Freq: Once | INTRAVENOUS | Status: AC | PRN
  Administered 2012-02-24: 3 via INTRAVENOUS

## 2012-02-24 MED ORDER — OXYCODONE HCL 5 MG PO TABS
10.0000 mg | ORAL_TABLET | ORAL | Status: DC | PRN
  Administered 2012-02-25 – 2012-02-26 (×5): 10 mg via ORAL
  Filled 2012-02-24 (×5): qty 2

## 2012-02-24 NOTE — Progress Notes (Signed)
PT Cancellation Note  Treatment cancelled today due to patient receiving procedure or test. Pt off the floor at [facility]. PT to f/u tomorrow.   [facility] HELEN 02/24/2012, 3:31 PM

## 2012-02-24 NOTE — Progress Notes (Signed)
0210 Patient up to bathroom unable to use bsc. O2 sats drop to 80 on Ra. O2 reapplied O2 sats now 92% on 3l.m nasal cannula. Patient unable to void. 1610 Schorr NP notified order for foley obtained. 0245 #14 french foley inserted draining clear yellow urine. Patient states she feels better. Will continue to monitor.

## 2012-02-24 NOTE — Progress Notes (Signed)
[facility] PROGRESS NOTE  Jasmine Huff AVW:[id number] DOB: 1956-01-17 DOA: 02/23/2012 PCP: [name], MD  Assessment/Plan: Active Problems:  Dehydration  ARF (acute renal failure)  Oral thrush  Hypoxia  Diabetes mellitus    1. Dehydration: Patient presented with progressive weakness, and was found to be hypotensive, with SBP in the 80s-90s, and with an elevated BUN/Creatinine ratio. These findings are consistent with dehydration/volume depletion. Fortunately, she has responded to aggressive fluid boluses, administered in the ED, as well as continued maintenance iv fluids, and BP has normalized today.  2. ARF (acute renal failure): Creatinine was 2.12 on presentation, with BUN of 41, against a known baseline creatinine of 0.80 documented on 10/27/11. These findings are consistent with ARF, and is likely to be pre-renal, secondary to dehydration, against a background of continuing diuretic and ACE-i therapy. These culprit medications have been discontinued, and patient, managed with iv fluids. Renal indices are improving, and renal ultrasound shows no evidence of hydronephrosis. Metformin was placed on hold. Improvement is anticipated.  3. Oral thrush: This was an incidental finding on physical examination. Patient will be managed with a 7-day course of oral Diflucan, now day#2.  4. DM: This is type 2, and appears well controlled, based on random blood glucose. We are managing with diet, and SSI. As mentioned above, Metformin is on hold for now, due to ARF. HBA1C is 6.1. 5: Hypoxia: Patient was found to have oxygen saturations in the 70s, on initial evaluation, and this has improved to 97%-99% on 3L. Patient is morbidly obese, was hypersomnolent on arrival, and CXR is devoid of acute pathology. Suspect OSA/OHS. Will continue oxygen supplementation for now. Patient claims that she had a normal PSG by Dr Earl Gala, about 2 weeks ago. We shall attempt to verify this. Have ordered V/Q scan.    6. Chronic pain: Controlled on pre-admission opioid medication.  7. Anxiety/Depression/Fibromyalgia; Stable.    Code Status: Full Code.  Family Communication: Disposition Plan: To bee determined.   Brief narrative: This is a 56 year old female, with history of Anxiety, panic attacks, depression, fibromyalgia syndrome, migraines, chronic pain syndrome, GERD, HTN, OA, DM-2, obesity, sent over prom her PMD's office to ED, because of a low blood pressure of 90/40 and low SPO2 of 74% on RA. According to patient, she had experienced progressive weakness for the past 2 months or so, has generalized weakness and feels more fatigued than she could ascribe to he fibromyalgia. She therefore, went to see her primary care physician today. On initial evaluation in the ED, she was found to have SBP in the 80s, and was managed with a 2 Liter bolus of NS, with improvement. She has also required oxygen supplementation, to keep saturations in the 90s. Per ED PA, she was somnolent initally, falling asleep during conversation.    Consultants: N/A  Procedures:  CXR  Renal Ultrasound.  Antibiotics:  N/A  HPI/Subjective: Feels better today, apart form generalized pains from fibromyalgia.   Objective: Vital signs in last 24 hours: Temp:  [97.8 F (36.6 C)-98.2 F (36.8 C)] 97.8 F (36.6 C) (08/20 1029) Pulse Rate:  [71-96] 87  (08/20 1029) Resp:  [16-20] 20  (08/20 1029) BP: (80-126)/(33-83) 112/58 mmHg (08/20 1029) SpO2:  [93 %-100 %] 93 % (08/20 0541) Weight:  [136.533 kg (301 lb)] 136.533 kg (301 lb) (08/20 0546) Weight change:  Last BM Date: 02/22/12  Intake/Output from previous day: 08/19 0701 - 08/20 0700 In: 1243.8 [I.V.:1243.8] Out: -  Total I/O In: 120 [P.O.:120]  Out: -    Physical Exam: General: Comfortable. Alert, communicative, fully oriented, talking in complete sentences, not short of breath at rest.  HEENT: Mild clinical pallor, no jaundice, no conjunctival injection or  discharge. Hydration has improved, and oral thrush is evident.  NECK: Supple, JVP not seen, no carotid bruits, no palpable lymphadenopathy, no palpable goiter.  CHEST: Clinically clear to auscultation, no wheezes, no crackles.  HEART: Sounds 1 and 2 heard, normal, regular, no murmurs.  ABDOMEN: Morbidly obese, soft, non-tender, no palpable organomegaly, no palpable masses, normal bowel sounds.  GENITALIA: Not examined.  LOWER EXTREMITIES: No pitting edema, palpable peripheral pulses.  MUSCULOSKELETAL SYSTEM: Unremarkable.  CENTRAL NERVOUS SYSTEM: No focal neurologic deficit on gross examination.  Lab Results:  Basename 02/24/12 0602 02/23/12 2129  WBC 8.1 10.6*  HGB 10.4* 12.0  HCT 34.5* 38.9  PLT 262 226    Basename 02/24/12 0602 02/23/12 2129 02/23/12 1322  NA 137 -- 136  K 5.1 -- 5.7*  CL 102 -- 99  CO2 27 -- 32  GLUCOSE 110* -- 124*  BUN 42* -- 41*  CREATININE 1.88* 1.84* --  CALCIUM 8.5 -- 8.9   No results found for this or any previous visit (from the past 240 hour(s)).   Studies/Results: US Renal  02/23/2012  *RADIOLOGY REPORT*  Clinical Data: Acute renal failure  RENAL/URINARY TRACT ULTRASOUND COMPLETE  Comparison:  None.  Findings:  Right Kidney:  9.1 cm. No hydronephrosis.  Well-preserved cortex. Normal size and parenchymal echotexture without focal abnormalities.  Left Kidney:  11 cm. No hydronephrosis.  Well-preserved cortex. Normal size and parenchymal echotexture without focal abnormalities.  Bladder:  Physiologically distended, unremarkable  IMPRESSION:  1.  Negative.  No hydronephrosis.   Original Report Authenticated By: Osa Craver, M.D.    Dg Chest Port 1 View  02/23/2012  *RADIOLOGY REPORT*  Clinical Data: Weakness, low blood pressure.  PORTABLE CHEST - 1 VIEW  Comparison: 10/27/2011  Findings: Mild cardiomegaly.  Mild peribronchial thickening.  No confluent opacities or effusions.  No overt edema.  No acute bony abnormality.  IMPRESSION:  Cardiomegaly.  Mild bronchitic changes.   Original Report Authenticated By: Cyndie Chime, M.D.     Medications: Scheduled Meds:   . aspirin EC  81 mg Oral Daily  . escitalopram  30 mg Oral Daily  . famotidine  20 mg Oral BID  . fluconazole  100 mg Oral Daily  . heparin  5,000 Units Subcutaneous Q8H  . hydroxychloroquine  200 mg Oral BID  . insulin aspart  0-9 Units Subcutaneous TID WC  . loratadine  10 mg Oral Daily  . morphine  60 mg Oral Daily  . ondansetron  4 mg Intravenous Once  . pantoprazole  40 mg Oral Q1200  . sodium chloride  1,000 mL Intravenous Once  . sodium chloride  500 mL Intravenous Once  . sodium polystyrene  15 g Oral Once  . traZODone  100 mg Oral QHS  . DISCONTD: sodium chloride  1,000 mL Intravenous Once  . DISCONTD: sodium chloride  3 mL Intravenous Q12H  . DISCONTD: sodium chloride  3 mL Intravenous Q12H   Continuous Infusions:   . sodium chloride 125 mL/hr at 02/24/12 1127   PRN Meds:.acetaminophen, acetaminophen, clonazePAM, ondansetron (ZOFRAN) IV, ondansetron, oxyCODONE, DISCONTD: sodium chloride, DISCONTD: oxyCODONE, DISCONTD: sodium chloride    LOS: 1 day   Erandy Mceachern,CHRISTOPHER  [facility] Pager [phone]. If 8PM-8AM, please contact night-coverage at [email or web address], password [facility] 02/24/2012, 1:00 PM  LOS:  1 day

## 2012-02-24 NOTE — Progress Notes (Signed)
OT Cancellation Note  Treatment cancelled today due to medical issues with patient which prohibited therapy. Attempted eval. Pt continued to fall asleep. Will return tomorrow.? Med related lethargy? [facility] OTR/L  [phone] 02/24/2012  Avant Printy,HILLARY 02/24/2012, 3:51 PM

## 2012-02-24 NOTE — Progress Notes (Signed)
6 Heard patient yelling help. Patient standing beside the bed states she has to go to the bathroom, foley bag in the floor and IV stretch across the bed. Patient very unsteady assisted back to bed. Patient ask why she did not call. Patient states she was calling by yelling out. Patient show the call light instructed patient how to call for assistance.Patient verbalize understanding call light place in easy reach for the patient. Will continue to monitor closely for her safety needs.

## 2012-02-25 ENCOUNTER — Ambulatory Visit (HOSPITAL_COMMUNITY): Payer: Self-pay | Admitting: Licensed Clinical Social Worker

## 2012-02-25 DIAGNOSIS — E669 Obesity, unspecified: Secondary | ICD-10-CM

## 2012-02-25 LAB — BLOOD GAS, ARTERIAL
Bicarbonate: 29.4 mEq/L — ABNORMAL HIGH (ref 20.0–24.0)
Drawn by: 31297
O2 Content: 2 L/min
O2 Saturation: 98.8 %
pCO2 arterial: 54.7 mmHg — ABNORMAL HIGH (ref 35.0–45.0)
pO2, Arterial: 94.9 mmHg (ref 80.0–100.0)

## 2012-02-25 LAB — BASIC METABOLIC PANEL
CO2: 31 mEq/L (ref 19–32)
GFR calc non Af Amer: 67 mL/min — ABNORMAL LOW (ref >90)
Glucose, Bld: 112 mg/dL — ABNORMAL HIGH (ref 70–99)
Potassium: 4.5 mEq/L (ref 3.5–5.1)
Sodium: 141 mEq/L (ref 135–145)

## 2012-02-25 LAB — GLUCOSE, CAPILLARY
Glucose-Capillary: 87 mg/dL (ref 70–99)
Glucose-Capillary: 126 mg/dL — ABNORMAL HIGH (ref 70–99)

## 2012-02-25 NOTE — Progress Notes (Signed)
Occupational Therapy Evaluation Patient Details Name: Jasmine Huff MRN: [id number] DOB: Mar 23, 1956 Today's Date: 02/25/2012 Time: 4098-1191 OT Time Calculation (min): 14 min  OT Assessment / Plan / Recommendation Clinical Impression  Pt admitted with dehydration and SOB.  Pt with limited participation in session but reports she feels she is at her baseline and feels much better than at admission.  OT to sign off at this time. However, recommending shower seat for d/c home.    OT Assessment  Patient does not need any further OT services    Follow Up Recommendations  No OT follow up;Supervision - Intermittent    Barriers to Discharge      Equipment Recommendations  Tub/shower seat    Recommendations for Other Services    Frequency       Precautions / Restrictions Precautions Precautions: None Restrictions Weight Bearing Restrictions: No   Pertinent Vitals/Pain See vitals    ADL  Eating/Feeding: Performed;Independent Where Assessed - Eating/Feeding: Edge of bed Grooming: Wash/dry face;Simulated Where Assessed - Grooming: Unsupported sitting Toilet Transfer: Simulated;Modified independent Toilet Transfer Method: Sit to Barista:  (bed) Transfers/Ambulation Related to ADLs: mod I ADL Comments: ADL assessment limited by pt declining to perform ADLs or OOB transfer.  Pt reports that she feels she is at her baselien level (I) with her ADLs as she was PTA.  Discussed with pt use of 3n1 as a shower chair, but pt reports she would prefer to have a separate shower chair. Recommending use of shower chair for safety and energy conservation.    OT Diagnosis:    OT Problem List:   OT Treatment Interventions:     OT Goals    Visit Information  Last OT Received On: 02/25/12 Assistance Needed: +1    Subjective Data      Prior Functioning  Vision/Perception  Home Living Lives With: Family (brother) Available Help at Discharge: Family;Available  PRN/intermittently Type of Home: House Home Access: Level entry Home Layout: One level Bathroom Shower/Tub: Heritage manager Accessibility: Yes How Accessible: Accessible via walker Home Adaptive Equipment: Walker - rolling;Bedside commode/3-in-1 Prior Function Level of Independence: Independent Able to Take Stairs?: Yes Driving: Yes Vocation: On disability Comments: uses RW intermittently Communication Communication: No difficulties      Cognition  Overall Cognitive Status: Appears within functional limits for tasks assessed/performed Arousal/Alertness: Lethargic Orientation Level: Oriented X4 / Intact Behavior During Session: Flat affect    Extremity/Trunk Assessment Right Upper Extremity Assessment RUE ROM/Strength/Tone: WFL for tasks assessed RUE Sensation: WFL - Light Touch;WFL - Proprioception RUE Coordination: WFL - gross/fine motor Left Upper Extremity Assessment LUE ROM/Strength/Tone: WFL for tasks assessed LUE Sensation: WFL - Light Touch;WFL - Proprioception LUE Coordination: WFL - gross/fine motor   Mobility Bed Mobility Bed Mobility: Supine to Sit;Sit to Supine Supine to Sit: 7: Independent Sit to Supine: 7: Independent Transfers Transfers: Stand to Sit;Sit to Stand Sit to Stand: 6: Modified independent (Device/Increase time);From bed Stand to Sit: 6: Modified independent (Device/Increase time);To bed   Exercise    Balance    End of Session OT - End of Session Activity Tolerance: Other (comment) (pt with limited participation) Patient left: in bed;with call bell/phone within reach Nurse Communication: Mobility status  GO    02/25/2012 Cipriano Mile OTR/L Pager [phone] Office [phone]  Cipriano Mile 02/25/2012, 3:13 PM

## 2012-02-25 NOTE — Progress Notes (Signed)
Physical Therapy Evaluation Patient Details Name: Jasmine Huff MRN: [id number] DOB: [birth year] Today's Date: 02/25/2012 Time: 1000-1030 PT Time Calculation (min): 30 min  PT Assessment / Plan / Recommendation Clinical Impression  Pt is 56 yo female with dehydration and SOB who is feeling much better since admission but is suffering from migraine this morning that is limiting activity tolerance.  Pt is mod I with activity and is encouraged to ambulate in hallway at least 3x per day.  No further acute or f/u therapy needs.    PT Assessment  Patent does not need any further PT services    Follow Up Recommendations  No PT follow up    Barriers to Discharge        Equipment Recommendations  Tub/shower seat    Recommendations for Other Services     Frequency      Precautions / Restrictions Precautions Precautions: None Restrictions Weight Bearing Restrictions: No   Pertinent Vitals/Pain 6/10 faces scale, migraine, RN gave meds O2 sats 93% on RA with activity      Mobility  Bed Mobility Bed Mobility: Supine to Sit;Sit to Supine Supine to Sit: 7: Independent Sit to Supine: 7: Independent Transfers Transfers: Sit to Stand;Stand to Sit Sit to Stand: 6: Modified independent (Device/Increase time);From bed;From toilet Stand to Sit: 6: Modified independent (Device/Increase time);To toilet;To bed Ambulation/Gait Ambulation/Gait Assistance: 6: Modified independent (Device/Increase time) Ambulation Distance (Feet): 100 Feet Assistive device: Rolling walker Ambulation/Gait Assistance Details: pt with wide BOS due to body mass, limited endurance but mod I with mobility.  Pt encouraged to ambulate at least 3x per day with nsg staff or family Gait Pattern: Step-through pattern;Wide base of support Gait velocity: decreased Stairs: No Wheelchair Mobility Wheelchair Mobility: No    Exercises     PT Diagnosis:    PT Problem List:   PT Treatment Interventions:     PT Goals     Visit Information  Last PT Received On: 02/25/12 Assistance Needed: +1    Subjective Data  Subjective: pt has migraine headache Patient Stated Goal: return home   Prior Functioning  Home Living Lives With: Family (brother) Available Help at Discharge: Family;Available PRN/intermittently Type of Home: House Home Access: Level entry Home Layout: One level Bathroom Shower/Tub: Heritage manager Accessibility: Yes How Accessible: Accessible via walker Home Adaptive Equipment: Walker - rolling;Bedside commode/3-in-1 Additional Comments: pt reports that she could use a shower chair Prior Function Level of Independence: Independent Able to Take Stairs?: Yes Driving: Yes Vocation: On disability Comments: pt uses RW intermittently Communication Communication: No difficulties    Cognition  Overall Cognitive Status: Appears within functional limits for tasks assessed/performed Arousal/Alertness: Awake/alert Orientation Level: Oriented X4 / Intact Behavior During Session: Flat affect    Extremity/Trunk Assessment Right Upper Extremity Assessment RUE ROM/Strength/Tone: WFL for tasks assessed RUE Sensation: WFL - Light Touch RUE Coordination: WFL - gross motor Left Upper Extremity Assessment LUE ROM/Strength/Tone: WFL for tasks assessed LUE Sensation: WFL - Light Touch LUE Coordination: WFL - gross motor Right Lower Extremity Assessment RLE ROM/Strength/Tone: WFL for tasks assessed RLE Sensation: WFL - Light Touch RLE Coordination: WFL - gross motor Left Lower Extremity Assessment LLE ROM/Strength/Tone: WFL for tasks assessed LLE Sensation: WFL - Light Touch LLE Coordination: WFL - gross motor Trunk Assessment Trunk Assessment: Normal   Balance Balance Balance Assessed: Yes Dynamic Standing Balance Dynamic Standing - Balance Support: No upper extremity supported;During functional activity Dynamic Standing - Level of Assistance: 6: Modified independent  (Device/Increase time)  End  of Session PT - End of Session Equipment Utilized During Treatment: Gait belt Activity Tolerance: Patient tolerated treatment well Patient left: in bed;with call bell/phone within reach Nurse Communication: Mobility status  GP   Lyanne Co, PT  Acute Rehab Services  [phone]   Lyanne Co 02/25/2012, 10:49 AM

## 2012-02-25 NOTE — Progress Notes (Signed)
Clinical Social Work Department BRIEF PSYCHOSOCIAL ASSESSMENT 02/25/2012  Patient:  Jasmine Huff, Jasmine Huff     Account Number:  [id number]     Admit date:  02/23/2012  Clinical Social Worker:  Dennison Bulla  Date/Time:  02/25/2012 11:50 AM  Referred by:  RN  Date Referred:  02/25/2012 Referred for  Psychosocial assessment   Other Referral:   Interview type:  Patient Other interview type:    PSYCHOSOCIAL DATA Living Status:  ALONE Admitted from facility:   Level of care:   Primary support name:  Raiford Noble Primary support relationship to patient:  SIBLING Degree of support available:   Unknown at this time    CURRENT CONCERNS Current Concerns  Behavioral Health Issues   Other Concerns:    SOCIAL WORK ASSESSMENT / PLAN CSW received referral during progression. Per RN, patient is very anxious and appears depressed. CSW reviewed chart and met with patient at bedside. No visitors were present.    CSW introduced myself and explained role. Patient reported that she did not wish to speak with CSW. Patient reported she was frustrated with hospital stay and just wanted to be left alone. Patient asked that CSW leave and was not agreeable to CSW following up at a later time.    CSW is signing off but available if needed.   Assessment/plan status:  No Further Intervention Required Other assessment/ plan:   Information/referral to community resources:   Patient refused    PATIENT'S/FAMILY'S RESPONSE TO PLAN OF CARE: Patient was guarded and refused CSW consult.

## 2012-02-25 NOTE — Progress Notes (Signed)
[facility] PROGRESS NOTE  Jasmine Huff WUJ:[id number] DOB: [birth year] DOA: 02/23/2012   Assessment/Plan: Acute kidney injury -Improving with intravenous fluids Dehydration/generalized weakness -Improving with intravenous fluids Hypoxemia -Suspect obesity hypoventilation syndrome with chronic opioid use -Check ambulatory pulse ox -Check ABG on room air -Nuclear medicine perfusion scan is negative for pulmonary embolus -Check echocardiogram to rule out pulmonary hypertension resulting from chronic OHS Fibromyalgia/chronic pain -Controlled with opioid therapy Diabetes mellitus type 2 -Well controlled, hemoglobin A1c 6.1    Code Status: Full   Subjective: Patient is feeling better today. Her weakness has improved. She denies any chest pain, shortness breath, nausea, vomiting, diarrhea, abdominal pain. However, she does complain of bilateral shoulder pain and "pain all over". No dizziness or syncope. Denies any coughing, fevers, chills, orthopnea, PND.  Objective: Filed Vitals:   02/24/12 2207 02/25/12 0156 02/25/12 0543 02/25/12 1100  BP: 123/64 137/52 119/57 105/60  Pulse: 93 96 69 64  Temp: 98.6 F (37 C) 100.2 F (37.9 C) 98.4 F (36.9 C) 98.1 F (36.7 C)  TempSrc: Oral Oral Oral Oral  Resp: 18 18 18 18   Height:      Weight:      SpO2: 96% 96% 99% 99%    Intake/Output Summary (Last 24 hours) at 02/25/12 1417 Last data filed at 02/25/12 1239  Gross per 24 hour  Intake   1709 ml  Output   1500 ml  Net    209 ml   Weight change:   Exam:  General: Alert, awake, oriented x3, in no acute distress.  HEENT: No icterus, no neck masses,NCAT Heart: Regular rate and rhythm, without murmurs, rubs, gallops.  Lungs: Good air movement, bilateral air movement.  Abdomen: Soft, nontender, nondistended, positive bowel sounds.  EXT: Trace edema bilateral lower extremities. No rashes,   Data Reviewed: Basic Metabolic Panel:  Lab 02/25/12 9562 02/24/12 0602 02/23/12  2129 02/23/12 1322  NA 141 137 -- 136  K 4.5 5.1 -- --  CL 105 102 -- 99  CO2 31 27 -- 32  GLUCOSE 112* 110* -- 124*  BUN 25* 42* -- 41*  CREATININE 0.94 1.88* 1.84* 2.12*  CALCIUM 8.7 8.5 -- 8.9  MG -- -- -- --  PHOS -- -- -- --   Liver Function Tests:  Lab 02/24/12 0602 02/23/12 1322  AST 11 11  ALT 8 9  ALKPHOS 74 82  BILITOT 0.2* 0.2*  PROT 6.2 6.7  ALBUMIN 3.1* 3.3*   No results found for this basename: LIPASE:5,AMYLASE:5 in the last 168 hours No results found for this basename: AMMONIA:5 in the last 168 hours CBC:  Lab 02/24/12 0602 02/23/12 2129 02/23/12 1322  WBC 8.1 10.6* 13.7*  NEUTROABS -- -- 11.4*  HGB 10.4* 12.0 11.0*  HCT 34.5* 38.9 35.2*  MCV 91.3 91.3 90.3  PLT 262 226 277   Cardiac Enzymes:  Lab 02/23/12 1323  CKTOTAL --  CKMB --  CKMBINDEX --  TROPONINI <0.30   BNP: No components found with this basename: POCBNP:5 CBG:  Lab 02/25/12 1207 02/25/12 0802 02/24/12 2033 02/24/12 1839 02/24/12 1242  GLUCAP 118* 92 131* 130* 127*    No results found for this or any previous visit (from the past 240 hour(s)).   Studies: US Renal  02/23/2012  *RADIOLOGY REPORT*  Clinical Data: Acute renal failure  RENAL/URINARY TRACT ULTRASOUND COMPLETE  Comparison:  None.  Findings:  Right Kidney:  9.1 cm. No hydronephrosis.  Well-preserved cortex. Normal size and parenchymal echotexture without focal abnormalities.  Left  Kidney:  11 cm. No hydronephrosis.  Well-preserved cortex. Normal size and parenchymal echotexture without focal abnormalities.  Bladder:  Physiologically distended, unremarkable  IMPRESSION:  1.  Negative.  No hydronephrosis.   Original Report Authenticated By: Osa Craver, M.D.    Dg Chest Port 1 View  02/23/2012  *RADIOLOGY REPORT*  Clinical Data: Weakness, low blood pressure.  PORTABLE CHEST - 1 VIEW  Comparison: 10/27/2011  Findings: Mild cardiomegaly.  Mild peribronchial thickening.  No confluent opacities or effusions.  No overt  edema.  No acute bony abnormality.  IMPRESSION: Cardiomegaly.  Mild bronchitic changes.   Original Report Authenticated By: Cyndie Chime, M.D.     Scheduled Meds:   . aspirin EC  81 mg Oral Daily  . escitalopram  30 mg Oral Daily  . famotidine  20 mg Oral BID  . fluconazole  100 mg Oral Daily  . heparin  5,000 Units Subcutaneous Q8H  . hydroxychloroquine  200 mg Oral BID  . insulin aspart  0-9 Units Subcutaneous TID WC  . loratadine  10 mg Oral Daily  . morphine  60 mg Oral Daily  . pantoprazole  40 mg Oral Q1200  . traZODone  100 mg Oral QHS   Continuous Infusions:   . sodium chloride 75 mL/hr at 02/25/12 0532    Jasmine Hartshorn, DO  [facility] Pager [phone]  If 7PM-7AM, please contact night-coverage [email or web address] Password [email or web address] 02/25/2012, 2:17 PM

## 2012-02-26 DIAGNOSIS — G8929 Other chronic pain: Secondary | ICD-10-CM

## 2012-02-26 LAB — TSH: TSH: 0.537 u[IU]/mL (ref 0.350–4.500)

## 2012-02-26 LAB — BASIC METABOLIC PANEL
BUN: 15 mg/dL (ref 6–23)
Calcium: 8.8 mg/dL (ref 8.4–10.5)
Chloride: 106 mEq/L (ref 96–112)
Creatinine, Ser: 0.88 mg/dL (ref 0.50–1.10)
GFR calc Af Amer: 84 mL/min — ABNORMAL LOW (ref >90)
GFR calc non Af Amer: 73 mL/min — ABNORMAL LOW (ref >90)

## 2012-02-26 LAB — GLUCOSE, CAPILLARY: Glucose-Capillary: 93 mg/dL (ref 70–99)

## 2012-02-26 MED ORDER — FLUCONAZOLE 100 MG PO TABS: 100.0000 mg | ORAL_TABLET | Freq: Every day | ORAL | Status: AC

## 2012-02-26 NOTE — Discharge Summary (Signed)
Physician Discharge Summary  Jasmine Huff NWG:[id number] DOB: 05/28/1956 DOA: 02/23/2012  PCP: [name], MD  Admit date: 02/23/2012 Discharge date: 02/26/2012  Recommendations for Outpatient Follow-up:  1. Pt will need to follow up with PCP in 2-3 weeks post discharge 2. Please obtain BMP to evaluate electrolytes and kidney function 3. Please also check CBC to evaluate Hg and Hct level  Discharge Diagnoses:  Active Problems:  Dehydration  ARF (acute renal failure)  Oral thrush  Hypoxia  Diabetes mellitus   Discharge Condition: Stable    Wt Readings from Last 3 Encounters:  02/24/12 136.533 kg (301 lb)  12/31/11 136.533 kg (301 lb)  10/27/11 136.079 kg (300 lb)    History of present illness:  56 year old female that presented with progressive weakness x2 months and pain all over.  Hospital Course:  The patient was found to be hypotensive. She was given 2 L of normal saline and maintenance intravenous fluids with improvement of her blood pressure. The patient was found to be in acute renal failure with an initial creatinine of 2.12. This is thought to be due to dehydration. Her renal function improved back to normal at the time of discharge. Because of her acute renal failure, the patient's lisinopril and metformin were held during the hospitalization. Hemoglobin A1c was found to be 6.1. An ABG was performed due to the patient's hypoxemia. It showed 7.34/54/95/29. She did not have any chest discomfort or dyspnea. Her hypoxemia was thought to be due to her opioids as well as obstructive sleep apnea/obesity hypoventilation syndrome. The patient was found to have oral thrush. The patient was started for consult. This improved. The patient will go home with 4 additional days of fluconazole 100 mg daily. The patient's pain regimen was optimized. She was placed back on her home medications. She states her pain was controlled. The patient's blood pressure remained well-controlled  without lisinopril.  Discharge Exam: Filed Vitals:   02/26/12 1302  BP: 111/59  Pulse: 66  Temp: 98.2 F (36.8 C)  Resp: 18   Filed Vitals:   02/25/12 1400 02/25/12 2116 02/26/12 0538 02/26/12 1302  BP: 122/66 133/65 114/58 111/59  Pulse: 66 68 66 66  Temp: 98.6 F (37 C) 98.3 F (36.8 C) 97.7 F (36.5 C) 98.2 F (36.8 C)  TempSrc: Oral Oral Oral Oral  Resp: 18 20 16 18   Height:      Weight:      SpO2: 98% 99% 100% 99%   General: No apparent distress, alert and oriented x3 Cardiovascular: Regular rate and rhythm. No rubs gallops or murmurs Respiratory: Clear to auscultation. No wheezes or rhonchi Abdomen: Soft, nontender.  nondistended  Discharge Instructions  Discharge Orders    Future Orders Please Complete By Expires   Diet - low sodium heart healthy      Increase activity slowly      Discharge instructions      Comments:   Do not restart lisinpril/HCTZ until follow up with primary care physician     Medication List  As of 02/26/2012  1:46 PM   STOP taking these medications         lisinopril-hydrochlorothiazide 20-12.5 MG per tablet         TAKE these medications         aspirin EC 81 MG tablet   Take 81 mg by mouth daily.      cetirizine 10 MG tablet   Commonly known as: ZYRTEC   Take 10 mg by mouth at bedtime.  clonazePAM 0.5 MG tablet   Commonly known as: KLONOPIN   Take 0.5 mg by mouth at bedtime as needed. For anxiety/rest      escitalopram 20 MG tablet   Commonly known as: LEXAPRO   Take 1.5 tablets (30 mg total) by mouth daily.      famotidine 20 MG tablet   Commonly known as: PEPCID   Take 1 tablet (20 mg total) by mouth 2 (two) times daily.      fluconazole 100 MG tablet   Commonly known as: DIFLUCAN   Take 1 tablet (100 mg total) by mouth daily.      hydroxychloroquine 200 MG tablet   Commonly known as: PLAQUENIL   Take 200 mg by mouth 2 (two) times daily.      metFORMIN 500 MG 24 hr tablet   Commonly known as:  GLUCOPHAGE-XR   Take 500 mg by mouth every evening.      morphine 60 MG 12 hr tablet   Commonly known as: MS CONTIN   Take 60 mg by mouth daily.      omeprazole 40 MG capsule   Commonly known as: PRILOSEC   Take 40 mg by mouth daily.      oxyCODONE-acetaminophen 10-325 MG per tablet   Commonly known as: PERCOCET   Take 1 tablet by mouth every 4 (four) hours as needed. For pain      traZODone 100 MG tablet   Commonly known as: DESYREL   Take 1 tablet (100 mg total) by mouth at bedtime.             The results of significant diagnostics from this hospitalization (including imaging, microbiology, ancillary and laboratory) are listed below for reference.    Significant Diagnostic Studies: US Renal  02/23/2012  *RADIOLOGY REPORT*  Clinical Data: Acute renal failure  RENAL/URINARY TRACT ULTRASOUND COMPLETE  Comparison:  None.  Findings:  Right Kidney:  9.1 cm. No hydronephrosis.  Well-preserved cortex. Normal size and parenchymal echotexture without focal abnormalities.  Left Kidney:  11 cm. No hydronephrosis.  Well-preserved cortex. Normal size and parenchymal echotexture without focal abnormalities.  Bladder:  Physiologically distended, unremarkable  IMPRESSION:  1.  Negative.  No hydronephrosis.   Original Report Authenticated By: Osa Craver, M.D.    Dg Chest Port 1 View  02/23/2012  *RADIOLOGY REPORT*  Clinical Data: Weakness, low blood pressure.  PORTABLE CHEST - 1 VIEW  Comparison: 10/27/2011  Findings: Mild cardiomegaly.  Mild peribronchial thickening.  No confluent opacities or effusions.  No overt edema.  No acute bony abnormality.  IMPRESSION: Cardiomegaly.  Mild bronchitic changes.   Original Report Authenticated By: Cyndie Chime, M.D.      Microbiology: No results found for this or any previous visit (from the past 240 hour(s)).   Labs: Basic Metabolic Panel:  Lab 02/26/12 1610 02/25/12 0550 02/24/12 0602 02/23/12 2129 02/23/12 1322  NA 142 141 137 -- 136   K 4.5 4.5 -- -- --  CL 106 105 102 -- 99  CO2 31 31 27  -- 32  GLUCOSE 120* 112* 110* -- 124*  BUN 15 25* 42* -- 41*  CREATININE 0.88 0.94 1.88* 1.84* 2.12*  CALCIUM 8.8 8.7 8.5 -- 8.9  MG -- -- -- -- --  PHOS -- -- -- -- --   Liver Function Tests:  Lab 02/24/12 0602 02/23/12 1322  AST 11 11  ALT 8 9  ALKPHOS 74 82  BILITOT 0.2* 0.2*  PROT 6.2 6.7  ALBUMIN  3.1* 3.3*   No results found for this basename: LIPASE:5,AMYLASE:5 in the last 168 hours No results found for this basename: AMMONIA:5 in the last 168 hours CBC:  Lab 02/24/12 0602 02/23/12 2129 02/23/12 1322  WBC 8.1 10.6* 13.7*  NEUTROABS -- -- 11.4*  HGB 10.4* 12.0 11.0*  HCT 34.5* 38.9 35.2*  MCV 91.3 91.3 90.3  PLT 262 226 277   Cardiac Enzymes:  Lab 02/23/12 1323  CKTOTAL --  CKMB --  CKMBINDEX --  TROPONINI <0.30   BNP: No components found with this basename: POCBNP:5 CBG:  Lab 02/26/12 1149 02/26/12 0741 02/25/12 2230 02/25/12 1657 02/25/12 1207  GLUCAP 93 102* 126* 87 118*    Time coordinating discharge:  Greater than 30 minutes  Signed:  Corri Delapaz, DO [facility] Pager: [phone] 02/26/2012, 1:46 PM

## 2012-03-01 DIAGNOSIS — I509 Heart failure, unspecified: Secondary | ICD-10-CM | POA: Diagnosis not present

## 2012-03-01 DIAGNOSIS — N179 Acute kidney failure, unspecified: Secondary | ICD-10-CM | POA: Diagnosis not present

## 2012-03-01 DIAGNOSIS — I951 Orthostatic hypotension: Secondary | ICD-10-CM | POA: Diagnosis not present

## 2012-03-01 DIAGNOSIS — I10 Essential (primary) hypertension: Secondary | ICD-10-CM | POA: Diagnosis not present

## 2012-03-01 DIAGNOSIS — D649 Anemia, unspecified: Secondary | ICD-10-CM | POA: Diagnosis not present

## 2012-03-01 DIAGNOSIS — R0902 Hypoxemia: Secondary | ICD-10-CM | POA: Diagnosis not present

## 2012-03-03 ENCOUNTER — Ambulatory Visit (HOSPITAL_COMMUNITY): Payer: Self-pay | Admitting: Licensed Clinical Social Worker

## 2012-03-05 DIAGNOSIS — I509 Heart failure, unspecified: Secondary | ICD-10-CM | POA: Diagnosis not present

## 2012-03-23 ENCOUNTER — Encounter: Payer: Self-pay | Admitting: Pulmonary Disease

## 2012-03-24 ENCOUNTER — Encounter: Payer: Self-pay | Admitting: Pulmonary Disease

## 2012-03-24 ENCOUNTER — Ambulatory Visit (INDEPENDENT_AMBULATORY_CARE_PROVIDER_SITE_OTHER): Payer: Medicare Other | Admitting: Pulmonary Disease

## 2012-03-24 VITALS — BP 132/78 | HR 84 | Temp 98.5°F | Ht 63.0 in | Wt 298.4 lb

## 2012-03-24 DIAGNOSIS — G4733 Obstructive sleep apnea (adult) (pediatric): Secondary | ICD-10-CM

## 2012-03-24 NOTE — Assessment & Plan Note (Signed)
The patient's history is suspicious for obstructive sleep apnea, however she denies any issues with daytime sleepiness.  She does have issues with fatigue, and sometimes patients have difficulty sorting these 2 symptoms out.  I think she would benefit from a sleep study for evaluation, and the patient is agreeable.  The other question is whether she has obesity hypoventilation syndrome, or possibly nocturnal hypoxemia related to hypoaeration during the night because of her obesity.  She did have an arterial blood gas during her admission which showed acute hypercarbia, and normally we see a blood gas consistent with chronic hypercarbia in [facility].  Her, she has had an elevated serum bicarbonate level on subsequent blood work.  I have had a long discussion with her about sleep apnea, as well as obesity hypoventilation syndrome.  It is also unclear how much her pain medications may be playing a role in her symptoms.

## 2012-03-24 NOTE — Progress Notes (Signed)
  Subjective:    Patient ID: Jasmine Huff, female    DOB: 04-12-1956, 56 y.o.   MRN: [id number]  HPI The patient is a 56 year old female asked to see for possible obstructive sleep apnea.  She was recently in the hospital with multiple medical issues, and found to have acute hypercarbia on presentation.  The question was raised whether she may have sleep apnea, obesity hypoventilation syndrome, or what role that her opioid pain medicines may play.  The patient has been told that she does not store unless she is having allergy issues, and she states that no one at home has witnessed apneas.  Apparently, there were witnessed apneas while in the hospital.  The patient notes frequent awakenings at night that she feels is because of bladder issues.  She also has significant issues with sleep onset.  She never feels rested in the mornings upon arising, and states that she is tired all of the time.  However, she denies daytime sleepiness, and feels it is more of a fatigue issue.  She does not nap during the day, and denies any sleepiness with driving.  She states that her weight is stable over the last 2 years, and her Epworth score today is zero.  Sleep Questionnaire: What time do you typically go to bed?( Between what hours) 10 -11 pm How long does it take you to fall asleep? 3 hours How many times during the night do you wake up? What time do you get out of bed to start your day? Do you drive or operate heavy machinery in your occupation? How much has your weight changed (up or down) over the past two years? (In pounds) Have you ever had a sleep study before? No Do you currently use CPAP? No Do you wear oxygen at any time? No    Review of Systems  Constitutional: Negative for fever and unexpected weight change.  HENT: Positive for congestion and rhinorrhea. Negative for ear pain, nosebleeds, sore throat, sneezing, trouble swallowing, dental problem, postnasal drip and sinus pressure.   Eyes: Positive for  itching. Negative for redness.  Respiratory: Positive for cough, shortness of breath and wheezing. Negative for chest tightness.   Cardiovascular: Positive for palpitations. Negative for leg swelling.  Gastrointestinal: Negative for nausea and vomiting.  Genitourinary: Negative for dysuria.  Musculoskeletal: Negative for joint swelling.  Skin: Negative for rash.  Neurological: Negative for headaches.  Hematological: Does not bruise/bleed easily.  Psychiatric/Behavioral: Positive for dysphoric mood. The patient is nervous/anxious.        Objective:   Physical Exam Constitutional:  Morbidly obese female, no acute distress  HENT:  Nares patent without discharge  Oropharynx without exudate, palate and uvula are moderately thickened and elongated  Small oral cavity with side wall narrowing.   Eyes:  Perrla, eomi, no scleral icterus  Neck:  No JVD, no TMG  Cardiovascular:  Normal rate, regular rhythm, no rubs or gallops.  No murmurs        Intact distal pulses  Pulmonary :  Normal breath sounds, no stridor or respiratory distress   No rales, rhonchi, or wheezing  Abdominal:  Soft, nondistended, bowel sounds present.  No tenderness noted.   Musculoskeletal:  mild lower extremity edema noted.  Lymph Nodes:  No cervical lymphadenopathy noted  Skin:  No cyanosis noted  Neurologic:  Alert, appropriate, moves all 4 extremities without obvious deficit.         Assessment & Plan:

## 2012-03-24 NOTE — Patient Instructions (Addendum)
Will schedule for a sleep study.  Will arrange followup once the results are available.

## 2012-03-25 DIAGNOSIS — R1084 Generalized abdominal pain: Secondary | ICD-10-CM | POA: Diagnosis not present

## 2012-03-25 DIAGNOSIS — R110 Nausea: Secondary | ICD-10-CM | POA: Diagnosis not present

## 2012-03-25 DIAGNOSIS — Z79899 Other long term (current) drug therapy: Secondary | ICD-10-CM | POA: Diagnosis not present

## 2012-03-25 DIAGNOSIS — D509 Iron deficiency anemia, unspecified: Secondary | ICD-10-CM | POA: Diagnosis not present

## 2012-03-26 DIAGNOSIS — I509 Heart failure, unspecified: Secondary | ICD-10-CM | POA: Diagnosis not present

## 2012-03-26 DIAGNOSIS — I519 Heart disease, unspecified: Secondary | ICD-10-CM | POA: Diagnosis not present

## 2012-03-26 DIAGNOSIS — N179 Acute kidney failure, unspecified: Secondary | ICD-10-CM | POA: Diagnosis not present

## 2012-03-26 DIAGNOSIS — I10 Essential (primary) hypertension: Secondary | ICD-10-CM | POA: Diagnosis not present

## 2012-03-26 DIAGNOSIS — R0902 Hypoxemia: Secondary | ICD-10-CM | POA: Diagnosis not present

## 2012-03-26 DIAGNOSIS — E119 Type 2 diabetes mellitus without complications: Secondary | ICD-10-CM | POA: Diagnosis not present

## 2012-03-26 DIAGNOSIS — I951 Orthostatic hypotension: Secondary | ICD-10-CM | POA: Diagnosis not present

## 2012-03-26 DIAGNOSIS — F329 Major depressive disorder, single episode, unspecified: Secondary | ICD-10-CM | POA: Diagnosis not present

## 2012-03-26 DIAGNOSIS — R110 Nausea: Secondary | ICD-10-CM | POA: Diagnosis not present

## 2012-03-26 DIAGNOSIS — Z23 Encounter for immunization: Secondary | ICD-10-CM | POA: Diagnosis not present

## 2012-03-26 DIAGNOSIS — G478 Other sleep disorders: Secondary | ICD-10-CM | POA: Diagnosis not present

## 2012-03-26 DIAGNOSIS — IMO0001 Reserved for inherently not codable concepts without codable children: Secondary | ICD-10-CM | POA: Diagnosis not present

## 2012-04-04 ENCOUNTER — Inpatient Hospital Stay (HOSPITAL_COMMUNITY): Payer: Medicare Other

## 2012-04-04 ENCOUNTER — Encounter (HOSPITAL_COMMUNITY): Payer: Self-pay | Admitting: Certified Registered Nurse Anesthetist

## 2012-04-04 ENCOUNTER — Emergency Department (HOSPITAL_COMMUNITY): Payer: Medicare Other

## 2012-04-04 ENCOUNTER — Ambulatory Visit (HOSPITAL_BASED_OUTPATIENT_CLINIC_OR_DEPARTMENT_OTHER): Payer: Medicare Other

## 2012-04-04 ENCOUNTER — Inpatient Hospital Stay (HOSPITAL_COMMUNITY)
Admission: EM | Admit: 2012-04-04 | Discharge: 2012-04-16 | DRG: 329 | Disposition: A | Discharge status: Skilled Nursing Facility | Payer: Medicare Other | Attending: General Surgery | Admitting: General Surgery

## 2012-04-04 ENCOUNTER — Emergency Department (HOSPITAL_COMMUNITY): Payer: Medicare Other | Admitting: Certified Registered Nurse Anesthetist

## 2012-04-04 ENCOUNTER — Encounter (HOSPITAL_COMMUNITY): Payer: Self-pay | Admitting: Emergency Medicine

## 2012-04-04 ENCOUNTER — Encounter (HOSPITAL_COMMUNITY): Admission: EM | Disposition: A | Discharge status: Skilled Nursing Facility | Payer: Self-pay | Source: Home / Self Care

## 2012-04-04 DIAGNOSIS — E669 Obesity, unspecified: Secondary | ICD-10-CM | POA: Diagnosis not present

## 2012-04-04 DIAGNOSIS — D72829 Elevated white blood cell count, unspecified: Secondary | ICD-10-CM | POA: Diagnosis present

## 2012-04-04 DIAGNOSIS — R52 Pain, unspecified: Secondary | ICD-10-CM | POA: Diagnosis not present

## 2012-04-04 DIAGNOSIS — K658 Other peritonitis: Secondary | ICD-10-CM | POA: Diagnosis not present

## 2012-04-04 DIAGNOSIS — M797 Fibromyalgia: Secondary | ICD-10-CM

## 2012-04-04 DIAGNOSIS — Z452 Encounter for adjustment and management of vascular access device: Secondary | ICD-10-CM | POA: Diagnosis not present

## 2012-04-04 DIAGNOSIS — M129 Arthropathy, unspecified: Secondary | ICD-10-CM | POA: Diagnosis not present

## 2012-04-04 DIAGNOSIS — K631 Perforation of intestine (nontraumatic): Secondary | ICD-10-CM | POA: Diagnosis present

## 2012-04-04 DIAGNOSIS — R6520 Severe sepsis without septic shock: Secondary | ICD-10-CM | POA: Diagnosis present

## 2012-04-04 DIAGNOSIS — N179 Acute kidney failure, unspecified: Secondary | ICD-10-CM | POA: Diagnosis present

## 2012-04-04 DIAGNOSIS — J9589 Other postprocedural complications and disorders of respiratory system, not elsewhere classified: Secondary | ICD-10-CM | POA: Diagnosis not present

## 2012-04-04 DIAGNOSIS — R0989 Other specified symptoms and signs involving the circulatory and respiratory systems: Secondary | ICD-10-CM | POA: Diagnosis not present

## 2012-04-04 DIAGNOSIS — J9600 Acute respiratory failure, unspecified whether with hypoxia or hypercapnia: Secondary | ICD-10-CM | POA: Diagnosis not present

## 2012-04-04 DIAGNOSIS — Z6841 Body Mass Index (BMI) 40.0 and over, adult: Secondary | ICD-10-CM | POA: Diagnosis not present

## 2012-04-04 DIAGNOSIS — R109 Unspecified abdominal pain: Secondary | ICD-10-CM | POA: Diagnosis not present

## 2012-04-04 DIAGNOSIS — K668 Other specified disorders of peritoneum: Secondary | ICD-10-CM | POA: Diagnosis not present

## 2012-04-04 DIAGNOSIS — J95821 Acute postprocedural respiratory failure: Secondary | ICD-10-CM | POA: Diagnosis not present

## 2012-04-04 DIAGNOSIS — E119 Type 2 diabetes mellitus without complications: Secondary | ICD-10-CM | POA: Diagnosis not present

## 2012-04-04 DIAGNOSIS — K5289 Other specified noninfective gastroenteritis and colitis: Secondary | ICD-10-CM | POA: Diagnosis not present

## 2012-04-04 DIAGNOSIS — K5732 Diverticulitis of large intestine without perforation or abscess without bleeding: Secondary | ICD-10-CM | POA: Diagnosis not present

## 2012-04-04 DIAGNOSIS — K560 Paralytic ileus: Secondary | ICD-10-CM | POA: Diagnosis not present

## 2012-04-04 DIAGNOSIS — R1032 Left lower quadrant pain: Secondary | ICD-10-CM | POA: Diagnosis not present

## 2012-04-04 DIAGNOSIS — R918 Other nonspecific abnormal finding of lung field: Secondary | ICD-10-CM | POA: Diagnosis not present

## 2012-04-04 DIAGNOSIS — Z7401 Bed confinement status: Secondary | ICD-10-CM | POA: Diagnosis not present

## 2012-04-04 DIAGNOSIS — B370 Candidal stomatitis: Secondary | ICD-10-CM | POA: Diagnosis present

## 2012-04-04 DIAGNOSIS — Z5189 Encounter for other specified aftercare: Secondary | ICD-10-CM | POA: Diagnosis not present

## 2012-04-04 DIAGNOSIS — E860 Dehydration: Secondary | ICD-10-CM | POA: Diagnosis not present

## 2012-04-04 DIAGNOSIS — K659 Peritonitis, unspecified: Secondary | ICD-10-CM | POA: Diagnosis not present

## 2012-04-04 DIAGNOSIS — A419 Sepsis, unspecified organism: Secondary | ICD-10-CM | POA: Diagnosis present

## 2012-04-04 DIAGNOSIS — K219 Gastro-esophageal reflux disease without esophagitis: Secondary | ICD-10-CM | POA: Diagnosis present

## 2012-04-04 DIAGNOSIS — G4733 Obstructive sleep apnea (adult) (pediatric): Secondary | ICD-10-CM | POA: Diagnosis present

## 2012-04-04 DIAGNOSIS — G8929 Other chronic pain: Secondary | ICD-10-CM

## 2012-04-04 DIAGNOSIS — I10 Essential (primary) hypertension: Secondary | ICD-10-CM | POA: Diagnosis not present

## 2012-04-04 DIAGNOSIS — F329 Major depressive disorder, single episode, unspecified: Secondary | ICD-10-CM

## 2012-04-04 DIAGNOSIS — K630 Abscess of intestine: Secondary | ICD-10-CM | POA: Diagnosis not present

## 2012-04-04 DIAGNOSIS — IMO0001 Reserved for inherently not codable concepts without codable children: Secondary | ICD-10-CM | POA: Diagnosis present

## 2012-04-04 DIAGNOSIS — K449 Diaphragmatic hernia without obstruction or gangrene: Secondary | ICD-10-CM | POA: Diagnosis not present

## 2012-04-04 DIAGNOSIS — F332 Major depressive disorder, recurrent severe without psychotic features: Secondary | ICD-10-CM | POA: Diagnosis present

## 2012-04-04 DIAGNOSIS — Z4682 Encounter for fitting and adjustment of non-vascular catheter: Secondary | ICD-10-CM | POA: Diagnosis not present

## 2012-04-04 DIAGNOSIS — K5720 Diverticulitis of large intestine with perforation and abscess without bleeding: Secondary | ICD-10-CM

## 2012-04-04 DIAGNOSIS — J811 Chronic pulmonary edema: Secondary | ICD-10-CM | POA: Diagnosis not present

## 2012-04-04 DIAGNOSIS — L0390 Cellulitis, unspecified: Secondary | ICD-10-CM | POA: Diagnosis not present

## 2012-04-04 HISTORY — PX: LAPAROTOMY: SHX154

## 2012-04-04 HISTORY — PX: COLOSTOMY: SHX63

## 2012-04-04 HISTORY — PX: OTHER SURGICAL HISTORY: SHX169

## 2012-04-04 LAB — URINALYSIS, ROUTINE W REFLEX MICROSCOPIC
Glucose, UA: NEGATIVE mg/dL
Nitrite: POSITIVE — AB
Protein, ur: 100 mg/dL — AB
Urobilinogen, UA: 4 mg/dL — ABNORMAL HIGH (ref 0.0–1.0)

## 2012-04-04 LAB — BLOOD GAS, ARTERIAL
Drawn by: 347861
MECHVT: 420 mL
O2 Saturation: 99.2 %
PEEP: 5 cmH2O
Patient temperature: 98.6
RATE: 14 resp/min
pH, Arterial: 7.364 (ref 7.350–7.450)

## 2012-04-04 LAB — CBC WITH DIFFERENTIAL/PLATELET
Basophils Absolute: 0 10*3/uL (ref 0.0–0.1)
Basophils Relative: 0 % (ref 0–1)
Eosinophils Absolute: 0 10*3/uL (ref 0.0–0.7)
Eosinophils Relative: 0 % (ref 0–5)
MCH: 27.5 pg (ref 26.0–34.0)
MCV: 84.1 fL (ref 78.0–100.0)
Neutrophils Relative %: 90 % — ABNORMAL HIGH (ref 43–77)
Platelets: 508 10*3/uL — ABNORMAL HIGH (ref 150–400)
RDW: 14.3 % (ref 11.5–15.5)

## 2012-04-04 LAB — CBC
Hemoglobin: 11.9 g/dL — ABNORMAL LOW (ref 12.0–15.0)
Platelets: 466 10*3/uL — ABNORMAL HIGH (ref 150–400)
RBC: 4.42 MIL/uL (ref 3.87–5.11)
WBC: 8.7 10*3/uL (ref 4.0–10.5)

## 2012-04-04 LAB — COMPREHENSIVE METABOLIC PANEL
AST: 14 U/L (ref 0–37)
Albumin: 2.7 g/dL — ABNORMAL LOW (ref 3.5–5.2)
Alkaline Phosphatase: 216 U/L — ABNORMAL HIGH (ref 39–117)
BUN: 20 mg/dL (ref 6–23)
CO2: 30 mEq/L (ref 19–32)
Chloride: 86 mEq/L — ABNORMAL LOW (ref 96–112)
Creatinine, Ser: 1.21 mg/dL — ABNORMAL HIGH (ref 0.50–1.10)
GFR calc non Af Amer: 49 mL/min — ABNORMAL LOW (ref >90)
Potassium: 3.1 mEq/L — ABNORMAL LOW (ref 3.5–5.1)
Total Bilirubin: 0.6 mg/dL (ref 0.3–1.2)

## 2012-04-04 LAB — URINE MICROSCOPIC-ADD ON

## 2012-04-04 LAB — BASIC METABOLIC PANEL
CO2: 27 mEq/L (ref 19–32)
Calcium: 7.8 mg/dL — ABNORMAL LOW (ref 8.4–10.5)
Chloride: 92 mEq/L — ABNORMAL LOW (ref 96–112)
Potassium: 3.4 mEq/L — ABNORMAL LOW (ref 3.5–5.1)
Sodium: 130 mEq/L — ABNORMAL LOW (ref 135–145)

## 2012-04-04 LAB — PHOSPHORUS: Phosphorus: 3.1 mg/dL (ref 2.3–4.6)

## 2012-04-04 LAB — MAGNESIUM: Magnesium: 1.4 mg/dL — ABNORMAL LOW (ref 1.5–2.5)

## 2012-04-04 SURGERY — LAPAROTOMY, EXPLORATORY
Anesthesia: General | Site: Abdomen | Wound class: Dirty or Infected

## 2012-04-04 MED ORDER — DOCUSATE SODIUM 100 MG PO CAPS
100.0000 mg | ORAL_CAPSULE | Freq: Two times a day (BID) | ORAL | Status: DC
  Filled 2012-04-04 (×7): qty 1

## 2012-04-04 MED ORDER — PROPOFOL 10 MG/ML IV BOLUS
INTRAVENOUS | Status: DC | PRN
  Administered 2012-04-04: 200 mg via INTRAVENOUS

## 2012-04-04 MED ORDER — SODIUM CHLORIDE 0.9 % IV BOLUS (SEPSIS)
1000.0000 mL | Freq: Once | INTRAVENOUS | Status: AC
  Administered 2012-04-04: 1000 mL via INTRAVENOUS

## 2012-04-04 MED ORDER — FENTANYL CITRATE 0.05 MG/ML IJ SOLN
50.0000 ug | INTRAMUSCULAR | Status: DC | PRN
  Administered 2012-04-04 – 2012-04-05 (×4): 100 ug via INTRAVENOUS
  Filled 2012-04-04 (×4): qty 2

## 2012-04-04 MED ORDER — SODIUM CHLORIDE 0.9 % IV SOLN
100.0000 mg | INTRAVENOUS | Status: DC
  Administered 2012-04-04 – 2012-04-15 (×12): 100 mg via INTRAVENOUS
  Filled 2012-04-04 (×17): qty 100

## 2012-04-04 MED ORDER — 0.9 % SODIUM CHLORIDE (POUR BTL) OPTIME
TOPICAL | Status: DC | PRN
  Administered 2012-04-04: 7000 mL

## 2012-04-04 MED ORDER — HYDROMORPHONE HCL PF 1 MG/ML IJ SOLN: 0.2500 mg | INTRAMUSCULAR | Status: DC | PRN

## 2012-04-04 MED ORDER — MIDAZOLAM HCL 2 MG/2ML IJ SOLN
2.0000 mg | Freq: Once | INTRAMUSCULAR | Status: DC
  Filled 2012-04-04: qty 4

## 2012-04-04 MED ORDER — ROCURONIUM BROMIDE 100 MG/10ML IV SOLN
INTRAVENOUS | Status: DC | PRN
  Administered 2012-04-04: 50 mg via INTRAVENOUS
  Administered 2012-04-04: 40 mg via INTRAVENOUS
  Administered 2012-04-04 (×2): 30 mg via INTRAVENOUS

## 2012-04-04 MED ORDER — LIDOCAINE HCL (CARDIAC) 20 MG/ML IV SOLN
INTRAVENOUS | Status: DC | PRN
  Administered 2012-04-04: 100 mg via INTRAVENOUS

## 2012-04-04 MED ORDER — SODIUM CHLORIDE 0.9 % IV SOLN
INTRAVENOUS | Status: AC
  Administered 2012-04-04: 20:00:00 via INTRAPERITONEAL
  Filled 2012-04-04: qty 6

## 2012-04-04 MED ORDER — HYDROMORPHONE HCL PF 1 MG/ML IJ SOLN
INTRAMUSCULAR | Status: AC
  Administered 2012-04-04: 1 mg
  Administered 2012-04-04: 1 mg via INTRAVENOUS
  Administered 2012-04-04 (×2): 0.5 mg via INTRAVENOUS
  Filled 2012-04-04: qty 1

## 2012-04-04 MED ORDER — MIDAZOLAM HCL 5 MG/5ML IJ SOLN
INTRAMUSCULAR | Status: DC | PRN
  Administered 2012-04-04 (×3): 2 mg via INTRAVENOUS

## 2012-04-04 MED ORDER — POTASSIUM CHLORIDE CRYS ER 20 MEQ PO TBCR
40.0000 meq | EXTENDED_RELEASE_TABLET | Freq: Once | ORAL | Status: AC
  Administered 2012-04-04: 40 meq via ORAL
  Filled 2012-04-04: qty 2

## 2012-04-04 MED ORDER — HETASTARCH-ELECTROLYTES 6 % IV SOLN
INTRAVENOUS | Status: DC | PRN
  Administered 2012-04-04: 21:00:00 via INTRAVENOUS

## 2012-04-04 MED ORDER — ONDANSETRON HCL 4 MG/2ML IJ SOLN
4.0000 mg | Freq: Four times a day (QID) | INTRAMUSCULAR | Status: DC | PRN
  Administered 2012-04-08 – 2012-04-15 (×7): 4 mg via INTRAVENOUS
  Filled 2012-04-04 (×7): qty 2

## 2012-04-04 MED ORDER — ONDANSETRON 4 MG PO TBDP
4.0000 mg | ORAL_TABLET | Freq: Once | ORAL | Status: DC
  Filled 2012-04-04: qty 1

## 2012-04-04 MED ORDER — ACETAMINOPHEN 10 MG/ML IV SOLN
INTRAVENOUS | Status: DC | PRN
  Administered 2012-04-04: 1000 mg via INTRAVENOUS

## 2012-04-04 MED ORDER — ONDANSETRON HCL 4 MG/2ML IJ SOLN
4.0000 mg | Freq: Once | INTRAMUSCULAR | Status: AC
  Administered 2012-04-04: 4 mg via INTRAVENOUS
  Filled 2012-04-04: qty 2

## 2012-04-04 MED ORDER — ACETAMINOPHEN 10 MG/ML IV SOLN
INTRAVENOUS | Status: AC
  Filled 2012-04-04: qty 100

## 2012-04-04 MED ORDER — PROMETHAZINE HCL 25 MG/ML IJ SOLN: 6.2500 mg | INTRAMUSCULAR | Status: DC | PRN

## 2012-04-04 MED ORDER — HEPARIN SODIUM (PORCINE) 5000 UNIT/ML IJ SOLN
5000.0000 [IU] | Freq: Three times a day (TID) | INTRAMUSCULAR | Status: DC
  Administered 2012-04-05 – 2012-04-16 (×34): 5000 [IU] via SUBCUTANEOUS
  Filled 2012-04-04 (×38): qty 1

## 2012-04-04 MED ORDER — SODIUM CHLORIDE 0.9 % IV SOLN
1.0000 g | Freq: Once | INTRAVENOUS | Status: AC
  Administered 2012-04-04: 1 g via INTRAVENOUS
  Filled 2012-04-04: qty 1

## 2012-04-04 MED ORDER — HYDROMORPHONE HCL PF 1 MG/ML IJ SOLN: 1.0000 mg | INTRAMUSCULAR | Status: DC | PRN

## 2012-04-04 MED ORDER — SODIUM CHLORIDE 0.9 % IV SOLN
1.0000 g | INTRAVENOUS | Status: DC
  Administered 2012-04-05 – 2012-04-09 (×4): 1 g via INTRAVENOUS
  Filled 2012-04-04 (×7): qty 1

## 2012-04-04 MED ORDER — HYDROMORPHONE HCL PF 1 MG/ML IJ SOLN
1.0000 mg | Freq: Once | INTRAMUSCULAR | Status: AC
  Administered 2012-04-04: 1 mg via INTRAVENOUS
  Filled 2012-04-04: qty 1

## 2012-04-04 MED ORDER — LACTATED RINGERS IV SOLN
INTRAVENOUS | Status: DC | PRN
  Administered 2012-04-04 (×3): via INTRAVENOUS

## 2012-04-04 MED ORDER — SODIUM CHLORIDE 0.9 % IV SOLN: 250.0000 mL | INTRAVENOUS | Status: DC | PRN

## 2012-04-04 MED ORDER — SODIUM CHLORIDE 0.9 % IV SOLN
INTRAVENOUS | Status: DC
  Administered 2012-04-05: 150 mL/h via INTRAVENOUS
  Administered 2012-04-07 – 2012-04-09 (×2): 20 mL/h via INTRAVENOUS
  Administered 2012-04-13: 15:00:00 via INTRAVENOUS
  Administered 2012-04-16: 20 mL/h via INTRAVENOUS

## 2012-04-04 MED ORDER — SUCCINYLCHOLINE CHLORIDE 20 MG/ML IJ SOLN
INTRAMUSCULAR | Status: DC | PRN
  Administered 2012-04-04: 200 mg via INTRAVENOUS

## 2012-04-04 MED ORDER — EPHEDRINE SULFATE 50 MG/ML IJ SOLN
INTRAMUSCULAR | Status: DC | PRN
  Administered 2012-04-04: 5 mg via INTRAVENOUS
  Administered 2012-04-04 (×2): 10 mg via INTRAVENOUS

## 2012-04-04 MED ORDER — FENTANYL CITRATE 0.05 MG/ML IJ SOLN
INTRAMUSCULAR | Status: DC | PRN
  Administered 2012-04-04: 25 ug via INTRAVENOUS
  Administered 2012-04-04: 50 ug via INTRAVENOUS
  Administered 2012-04-04 (×7): 25 ug via INTRAVENOUS

## 2012-04-04 SURGICAL SUPPLY — 65 items
APPLICATOR COTTON TIP 6IN STRL (MISCELLANEOUS) IMPLANT
BANDAGE GAUZE ELAST BULKY 4 IN (GAUZE/BANDAGES/DRESSINGS) ×2 IMPLANT
BLADE EXTENDED COATED 6.5IN (ELECTRODE) ×2 IMPLANT
BLADE HEX COATED 2.75 (ELECTRODE) ×2 IMPLANT
BLADE SURG SZ10 CARB STEEL (BLADE) ×2 IMPLANT
CANISTER SUCTION 2500CC (MISCELLANEOUS) ×2 IMPLANT
CATH ROBINSON RED A/P 14FR (CATHETERS) ×2 IMPLANT
CHLORAPREP W/TINT 26ML (MISCELLANEOUS) ×4 IMPLANT
CLIP TI LARGE 6 (CLIP) IMPLANT
CLOTH BEACON ORANGE TIMEOUT ST (SAFETY) ×2 IMPLANT
COVER MAYO STAND STRL (DRAPES) ×2 IMPLANT
DRAIN CHANNEL 19F RND (DRAIN) ×2 IMPLANT
DRAPE LAPAROSCOPIC ABDOMINAL (DRAPES) ×2 IMPLANT
DRAPE LG THREE QUARTER DISP (DRAPES) ×2 IMPLANT
DRAPE WARM FLUID 44X44 (DRAPE) ×2 IMPLANT
DRSG PAD ABDOMINAL 8X10 ST (GAUZE/BANDAGES/DRESSINGS) ×2 IMPLANT
ELECT REM PT RETURN 9FT ADLT (ELECTROSURGICAL) ×2
ELECTRODE REM PT RTRN 9FT ADLT (ELECTROSURGICAL) ×1 IMPLANT
EVACUATOR SILICONE 100CC (DRAIN) ×2 IMPLANT
GLOVE BIO SURGEON STRL SZ 6.5 (GLOVE) ×2 IMPLANT
GLOVE BIOGEL PI IND STRL 7.0 (GLOVE) ×1 IMPLANT
GLOVE BIOGEL PI INDICATOR 7.0 (GLOVE) ×1
GLOVE SS BIOGEL STRL SZ 7.5 (GLOVE) ×2 IMPLANT
GLOVE SUPERSENSE BIOGEL SZ 7.5 (GLOVE) ×2
GOWN PREVENTION PLUS XXLARGE (GOWN DISPOSABLE) ×2 IMPLANT
GOWN STRL NON-REIN LRG LVL3 (GOWN DISPOSABLE) ×2 IMPLANT
GOWN STRL REIN XL XLG (GOWN DISPOSABLE) ×4 IMPLANT
HAND ACTIVATED (MISCELLANEOUS) IMPLANT
KIT BASIN OR (CUSTOM PROCEDURE TRAY) ×2 IMPLANT
LEGGING LITHOTOMY PAIR STRL (DRAPES) IMPLANT
LIGASURE IMPACT 36 18CM CVD LR (INSTRUMENTS) ×2 IMPLANT
NS IRRIG 1000ML POUR BTL (IV SOLUTION) ×4 IMPLANT
PACK GENERAL/GYN (CUSTOM PROCEDURE TRAY) ×2 IMPLANT
SCALPEL HARMONIC ACE (MISCELLANEOUS) IMPLANT
SPONGE GAUZE 4X4 12PLY (GAUZE/BANDAGES/DRESSINGS) ×2 IMPLANT
SPONGE LAP 18X18 X RAY DECT (DISPOSABLE) ×12 IMPLANT
STAPLER CUT CVD 40MM BLUE (STAPLE) ×2 IMPLANT
STAPLER CUT RELOAD BLUE (STAPLE) ×2 IMPLANT
STAPLER PROXIMATE 75MM BLUE (STAPLE) ×2 IMPLANT
STAPLER VISISTAT 35W (STAPLE) ×2 IMPLANT
SUCTION POOLE TIP (SUCTIONS) ×2 IMPLANT
SUT ETHILON 3 0 PS 1 (SUTURE) ×2 IMPLANT
SUT MAXON ABS #0 GS21 30IN (SUTURE) ×2 IMPLANT
SUT NOV 1 T60/GS (SUTURE) IMPLANT
SUT NOVA NAB DX-16 0-1 5-0 T12 (SUTURE) ×10 IMPLANT
SUT NOVA NAB GS-21 0 18 T12 DT (SUTURE) ×2 IMPLANT
SUT NOVA NAB GS-22 2 0 T19 (SUTURE) ×2 IMPLANT
SUT PDS AB 1 CTX 36 (SUTURE) IMPLANT
SUT PDS AB 1 TP1 96 (SUTURE) IMPLANT
SUT PROLENE 2 0 CT 1 (SUTURE) ×2 IMPLANT
SUT SILK 2 0 (SUTURE) ×1
SUT SILK 2 0 SH CR/8 (SUTURE) ×2 IMPLANT
SUT SILK 2 0SH CR/8 30 (SUTURE) IMPLANT
SUT SILK 2-0 18XBRD TIE 12 (SUTURE) ×1 IMPLANT
SUT SILK 2-0 30XBRD TIE 12 (SUTURE) IMPLANT
SUT SILK 3 0 (SUTURE) ×2
SUT SILK 3 0 SH CR/8 (SUTURE) ×2 IMPLANT
SUT SILK 3-0 18XBRD TIE 12 (SUTURE) ×2 IMPLANT
SUT VIC AB 2-0 SH 18 (SUTURE) ×2 IMPLANT
SUT VIC AB 3-0 54XBRD REEL (SUTURE) IMPLANT
SUT VIC AB 3-0 BRD 54 (SUTURE)
TAPE CLOTH SURG 4X10 WHT LF (GAUZE/BANDAGES/DRESSINGS) ×2 IMPLANT
TOWEL OR 17X26 10 PK STRL BLUE (TOWEL DISPOSABLE) ×2 IMPLANT
TRAY FOLEY CATH 14FRSI W/METER (CATHETERS) ×2 IMPLANT
YANKAUER SUCT BULB TIP NO VENT (SUCTIONS) ×2 IMPLANT

## 2012-04-04 NOTE — Anesthesia Preprocedure Evaluation (Addendum)
Anesthesia Evaluation  Patient identified by MRN, date of birth, ID band Patient awake    Reviewed: Allergy & Precautions, H&P , NPO status , Patient's Chart, lab work & pertinent test results  Airway Mallampati: III TM Distance: <3 FB Neck ROM: Full    Dental No notable dental hx. (+) Dental Advisory Given   Pulmonary sleep apnea ,    + decreased breath sounds      Cardiovascular hypertension, Pt. on medications Rhythm:Regular Rate:Tachycardia     Neuro/Psych Anxiety Chronic narcotic use    GI/Hepatic negative GI ROS, Neg liver ROS,   Endo/Other  diabetes, Type obesity  Renal/GU negative Renal ROS  negative genitourinary   Musculoskeletal negative musculoskeletal ROS (+)   Abdominal   Peds negative pediatric ROS (+)  Hematology negative hematology ROS (+)   Anesthesia Other Findings   Reproductive/Obstetrics negative OB ROS                          Anesthesia Physical Anesthesia Plan  ASA: III and Emergent  Anesthesia Plan: General   Post-op Pain Management:    Induction: Intravenous, Rapid sequence and Cricoid pressure planned  Airway Management Planned: Oral ETT  Additional Equipment:   Intra-op Plan:   Post-operative Plan: Possible Post-op intubation/ventilation  Informed Consent: I have reviewed the patients History and Physical, chart, labs and discussed the procedure including the risks, benefits and alternatives for the proposed anesthesia with the patient or authorized representative who has indicated his/her understanding and acceptance.   Dental advisory given  Plan Discussed with: CRNA and Surgeon  Anesthesia Plan Comments:         Anesthesia Quick Evaluation

## 2012-04-04 NOTE — H&P (Signed)
Chief Complaint  Patient presents with  . Abdominal Pain    presenting for 1 week; now unbearable    HISTORY: [name] is a 56 y.o. female who presents to the ED with severe abd pain.  She states that she has had LLQ pain for about a week and that it has worsened in the past 3 days.  She denies nausea or vomiting.  She is currently bedridden and the last time she was out of bed was about 2 months ago.  She has a PMH sig for fibromyalgia, DM2, HTN and depression.      Past Medical History  Diagnosis Date  . Arthritis   . Depression   . Allergy   . Hypertension   . Migraine   . UTI (lower urinary tract infection)   . Fibromyalgia   . Chronic pain syndrome   . GERD (gastroesophageal reflux disease) 01/21/2011  . Anxiety   . Panic attacks   . High blood pressure   . Knee pain   . Urination frequency   . Diabetes mellitus type II       Past Surgical History  Procedure Date  . Cholecystectomy   . Abdominal hysterectomy   . Right ovary removal     Current facility-administered medications:ertapenem (INVANZ) 1 g in sodium chloride 0.9 % 50 mL IVPB, 1 g, Intravenous, Once, Suzi Roots, MD, 1 g at 04/04/12 1611;  HYDROmorphone (DILAUDID) 1 MG/ML injection, , , , , 1 mg at 04/04/12 1610;  HYDROmorphone (DILAUDID) injection 1 mg, 1 mg, Intravenous, Once, Arthor Captain, PA-C, 1 mg at 04/04/12 1327 ondansetron (ZOFRAN) injection 4 mg, 4 mg, Intravenous, Once, Abigail Harris, PA-C, 4 mg at 04/04/12 1346;  potassium chloride SA (K-DUR,KLOR-CON) CR tablet 40 mEq, 40 mEq, Oral, Once, [employer], PA-C, 40 mEq at 04/04/12 1531;  sodium chloride 0.9 % bolus 1,000 mL, 1,000 mL, Intravenous, Once, Abigail Harris, PA-C, 1,000 mL at 04/04/12 1328 sodium chloride 0.9 % bolus 1,000 mL, 1,000 mL, Intravenous, Once, Suzi Roots, MD, 1,000 mL at 04/04/12 1616;  DISCONTD: ondansetron (ZOFRAN-ODT) disintegrating tablet 4 mg, 4 mg, Oral, Once, Arthor Captain, PA-C Current outpatient  prescriptions:aspirin EC 81 MG tablet, Take 81 mg by mouth daily., Disp: , Rfl: ;  cetirizine (ZYRTEC) 10 MG tablet, Take 10 mg by mouth at bedtime. , Disp: , Rfl: ;  clonazePAM (KLONOPIN) 0.5 MG tablet, Take 0.5 mg by mouth 3 (three) times daily as needed. For anxiety/rest, Disp: , Rfl: ;  escitalopram (LEXAPRO) 20 MG tablet, Take 1.5 tablets (30 mg total) by mouth daily., Disp: 45 tablet, Rfl: 0 Ferrous Gluconate (IRON) 240 (27 FE) MG TABS, Take 1 tablet by mouth daily. , Disp: , Rfl: ;  hydrochlorothiazide (HYDRODIURIL) 25 MG tablet, Take 25 mg by mouth daily. , Disp: , Rfl: ;  hydroxychloroquine (PLAQUENIL) 200 MG tablet, Take 200 mg by mouth 2 (two) times daily. , Disp: , Rfl: ;  morphine (MS CONTIN) 60 MG 12 hr tablet, Take 60 mg by mouth 2 (two) times daily as needed. For pain., Disp: , Rfl:  omeprazole (PRILOSEC) 40 MG capsule, Take 40 mg by mouth daily., Disp: , Rfl: ;  ondansetron (ZOFRAN) 4 MG tablet, Take 4 mg by mouth every 8 (eight) hours as needed. For nausea., Disp: , Rfl: ;  oxyCODONE-acetaminophen (PERCOCET) 10-325 MG per tablet, Take 1 tablet by mouth every 4 (four) hours as needed. For pain, Disp: , Rfl: ;  traZODone (DESYREL) 100 MG tablet, Take 1  tablet (100 mg total) by mouth at bedtime., Disp: 30 tablet, Rfl: 0 DISCONTD: solifenacin (VESICARE) 10 MG tablet, Take 0.5 tablets (5 mg total) by mouth daily., Disp: 30 tablet, Rfl: 1;  DISCONTD: tolterodine (DETROL LA) 4 MG 24 hr capsule, Take 1 capsule (4 mg total) by mouth daily., Disp: 30 capsule, Rfl: 2    Allergies  Allergen Reactions  . Pregabalin Rash    Alters Mental status, flushing  . Saphris (Asenapine Maleate) Shortness Of Breath and Other (See Comments)    Lips tingle and tongue becomes numb/Akasthesia  . Iodinated Diagnostic Agents     Dye used for CT scans, severe hives  . Seroquel (Quetiapine Fumarate)       Family History  Problem Relation Age of Onset  . Depression Mother   . Depression Father   . Alcohol abuse  Brother   . Depression Brother   . Depression Maternal Grandfather   . Depression Other     Mother with diverticulitis  History   Social History  . Marital Status: Divorced    Spouse Name: N/A    Number of Children: 0  . Years of Education: N/A   Occupational History  . disabled     Social History Main Topics  . Smoking status: Never Smoker   . Smokeless tobacco: Never Used  . Alcohol Use: No  . Drug Use: No  . Sexually Active: No   Other Topics Concern  . None   Social History Narrative  . None       REVIEW OF SYSTEMS - PERTINENT POSITIVES ONLY: Review of Systems - General ROS: positive for  - fever negative for - weight loss Psychological ROS: positive for - anxiety and depression negative for - hallucinations Hematological and Lymphatic ROS: negative for - bleeding problems, blood clots or bruising Respiratory ROS: no cough, shortness of breath, or wheezing Cardiovascular ROS: no chest pain or dyspnea on exertion Gastrointestinal ROS: positive for - abdominal pain and change in bowel habits negative for - nausea/vomiting Genito-Urinary ROS: no dysuria, trouble voiding, or hematuria Neurological ROS: no TIA or stroke symptoms  EXAM: Filed Vitals:   04/04/12 1531  BP: 102/59  Pulse: 106  Temp: 98.3 F (36.8 C)  Resp:     Gen:  No acute distress.  Well nourished and well groomed.   Neurological: Alert and oriented to person, place, and time. Coordination normal.  Head: Normocephalic and atraumatic.  Eyes: Conjunctivae are normal. Pupils are equal, round, and reactive to light. No scleral icterus.  Neck: Normal range of motion. Neck supple. No tracheal deviation or thyromegaly present.  No cervical lymphadenopathy. Cardiovascular: Tachycardic, regular rhythm, normal heart sounds and intact distal pulses.  Exam reveals no gallop and no friction rub.  No murmur heard. Respiratory: Effort poor.  No respiratory distress. No chest wall tenderness. Breath sounds  normal.  No wheezes, rales or rhonchi.  GI: The abdomen is obese and diffusely tender to palpation.  Peritoneal signs in LLQ Musculoskeletal: Normal range of motion. Extremities are nontender.  There is R inguinal cellulitis Skin: Skin is warm and dry. No rash noted. No diaphoresis. No erythema. No pallor. No clubbing, cyanosis, or edema.   Psychiatric: Normal mood and affect. Behavior is normal.  LABORATORY RESULTS: Available labs are reviewed  Lab Results  Component Value Date   WBC 18.9* 04/04/2012   HGB 12.6 04/04/2012   HCT 38.6 04/04/2012   MCV 84.1 04/04/2012   PLT 508* 04/04/2012   Sodium 130 (L)  135 - 145 mEq/L Final  Potassium 3.1 (L) 3.5 - 5.1 mEq/L Final  Chloride 86 (L) 96 - 112 mEq/L Final  CO2 30 19 - 32 mEq/L Final  Glucose, Bld 209 (H) 70 - 99 mg/dL Final  BUN 20 6 - 23 mg/dL Final  Creatinine, Ser 1.21 (H) 0.50 - 1.10 mg/dL Final  Calcium 8.8 8.4 - 10.5 mg/dL  Final Total Protein 7.2 6.0 - 8.3 g/dL Final  Albumin 2.7 (L) 3.5 - 5.2 g/dL Final  AST 14 0 - 37 U/L Final  ALT 26 0 - 35 U/L Final Alkaline Phosphatase 216 (H) 39 - 117 U/L Final  Total Bilirubin 0.6 0.3 - 1.2 mg/dL Final  GFR calc non Af Amer 49 (L) >90 mL/min Final  GFR calc Af Amer 57 (L) >90 mL/min Final      RADIOLOGY RESULTS:   Images and reports are reviewed. CT Abd/Pelvis IMPRESSION:  1. Free intraperitoneal air, secondary to inflammation centered around the distal descending and proximal sigmoid colon. Most likely diverticulitis. Critical test results telephoned to Dr. Denton Lank at the time of interpretation at 3:05 p.m. on 04/04/2012.  2. Small hiatal hernia. Esophageal air fluid level suggests dysmotility or gastroesophageal reflux.  3. Suspicion of constipation, including within the region of the inflamed sigmoid.      ASSESSMENT AND PLAN: This is a severely deconditioned, malnourished, obese 56yo F who presented to the ED with a perforated colon.  Emergent surgery is indicated.  IV  antibiotics started in the ED.  I discussed with her and her brother the operative plan of sigmoid resection and colostomy.  I specifically discussed the risks of pneumonia, bleeding, infection, abscess, blood clots, PE, heart attack, stroke, prolonged ventilation, sepsis and death.  I believe they both understand the risks and the need for emergent surgery.  They have agreed to proceed.  I have also discussed the need for long term rehab with the patient and her brother once she leaves the hospital.    Vanita Panda, MD Colon and Rectal Surgery / General Surgery [facility]      Visit Diagnoses: 1. Diverticulitis of large intestine with perforation   2. Free intraperitoneal air     Primary Care Physician: [name], MD

## 2012-04-04 NOTE — ED Provider Notes (Signed)
History     CSN: [id number]  Arrival date & time 04/04/12  1116   First MD Initiated Contact with Patient 04/04/12 1158      Chief Complaint  Patient presents with  . Abdominal Pain    presenting for 1 week; now unbearable    (Consider location/radiation/quality/duration/timing/severity/associated sxs/prior treatment) HPI Comments: Jasmine Huff 56 y.o. female   The chief complaint is: Patient presents with:   Abdominal Pain - presenting for 1 week; now unbearable  Past Medical History:   Arthritis                                                    Depression                                                   Allergy                                                      Hypertension                                                 Migraine                                                     UTI (lower urinary tract infection)                          Fibromyalgia                                                 Chronic pain syndrome                                        GERD (gastroesophageal reflux disease)          01/21/2011    Anxiety                                                      Panic attacks                                                High blood pressure  Knee pain                                                    Urination frequency                                          Diabetes mellitus type II                                   56 year old female presents to emergency department for chief complaint of abdominal pain. She states this is the worst pain. She has ever experienced. Past medical history as noted. She states she has 2 days of worsening abdominal pain with nausea and vomiting. States that her pain is periumbilical. She is on chronic pain medications and has chronic constipation. Last bowel movement was 4 days ago. She has a history of irritable bowel syndrome, as well. She states that she has not been able to  tolerate by mouth fluids or foods for the past 2 days. Patient is a diabetic. She is not currently taking any of her diabetic medic occasions. She was admitted to the hospital in early August. She was hit acute kidney injury. The child was taken off of her metformin. She has not been placed on any medications for diabetes control. She denies hematochezia. She denies melena. She denies hematemesis. She does not drink alcohol. She denies fevers or chills. She denies dysuria, frequency. She is status post cholecystectomy.    Discharge Diagnoses at time of last admission  Dehydration  ARF (acute renal failure)  Oral thrush  Hypoxia  Diabetes mellitus     Patient is a 56 y.o. female presenting with abdominal pain. The history is provided by the patient. No language interpreter was used.  Abdominal Pain The primary symptoms of the illness include abdominal pain, shortness of breath (due to pain in belly with inhalation), nausea and vomiting. The primary symptoms of the illness do not include fever, fatigue, diarrhea, hematemesis, hematochezia, dysuria, vaginal discharge or vaginal bleeding. The current episode started 2 days ago. The onset of the illness was gradual. The problem has been gradually worsening.  The abdominal pain began more than 2 days ago. The pain came on gradually. The abdominal pain has been gradually worsening since its onset. The abdominal pain is located in the periumbilical region. The abdominal pain does not radiate. The severity of the abdominal pain is 10/10. The abdominal pain is relieved by nothing. The abdominal pain is exacerbated by deep breathing.  Nausea began 2 days ago.  The vomiting began 2 days ago. Vomiting occurs 2 to 5 times per day. The emesis contains stomach contents.  The patient states that she believes she is currently not pregnant. The patient has not had a change in bowel habit. Additional symptoms associated with the illness include anorexia and  constipation. Symptoms associated with the illness do not include chills, diaphoresis, heartburn, urgency, hematuria, frequency or back pain. Significant associated medical issues include GERD and diabetes. Significant associated medical issues do not include PUD, inflammatory bowel disease, sickle cell disease, gallstones, liver disease, substance abuse, diverticulitis, HIV or cardiac disease.    Past Medical History  Diagnosis Date  . Arthritis   . Depression   . Allergy   . Hypertension   . Migraine   . UTI (lower urinary tract infection)   . Fibromyalgia   . Chronic pain syndrome   . GERD (gastroesophageal reflux disease) 01/21/2011  . Anxiety   . Panic attacks   . High blood pressure   . Knee pain   . Urination frequency   . Diabetes mellitus type II     Past Surgical History  Procedure Date  . Cholecystectomy   . Abdominal hysterectomy   . Right ovary removal     Family History  Problem Relation Age of Onset  . Depression Mother   . Depression Father   . Alcohol abuse Brother   . Depression Brother   . Depression Maternal Grandfather   . Depression Other     History  Substance Use Topics  . Smoking status: Never Smoker   . Smokeless tobacco: Never Used  . Alcohol Use: No    OB History    Grav Para Term Preterm Abortions TAB SAB Ect Mult Living                  Review of Systems  Constitutional: Negative for fever, chills, diaphoresis and fatigue.  HENT: Negative for trouble swallowing.   Respiratory: Positive for shortness of breath (due to pain in belly with inhalation).   Cardiovascular: Negative for chest pain.  Gastrointestinal: Positive for nausea, vomiting, abdominal pain, constipation and anorexia. Negative for heartburn, diarrhea, blood in stool, hematochezia, abdominal distention and hematemesis.  Genitourinary: Negative for dysuria, urgency, frequency, hematuria, vaginal bleeding and vaginal discharge.  Musculoskeletal: Negative for myalgias,  back pain and arthralgias.  Skin: Negative for rash.  Neurological: Positive for weakness and light-headedness. Negative for dizziness, speech difficulty, numbness and headaches.  Psychiatric/Behavioral: Negative for behavioral problems.    Allergies  Pregabalin; Saphris; Iodinated diagnostic agents; and Seroquel  Home Medications   Current Outpatient Rx  Name Route Sig Dispense Refill  . ASPIRIN EC 81 MG PO TBEC Oral Take 81 mg by mouth daily.    Marland Kitchen CETIRIZINE HCL 10 MG PO TABS Oral Take 10 mg by mouth at bedtime.     Marland Kitchen CLONAZEPAM 0.5 MG PO TABS Oral Take 0.5 mg by mouth 3 (three) times daily as needed. For anxiety/rest    . ESCITALOPRAM OXALATE 20 MG PO TABS Oral Take 1.5 tablets (30 mg total) by mouth daily. 45 tablet 0  . IRON 240 (27 FE) MG PO TABS Oral Take 1 tablet by mouth daily.     Marland Kitchen HYDROCHLOROTHIAZIDE 25 MG PO TABS Oral Take 25 mg by mouth daily.     Marland Kitchen HYDROXYCHLOROQUINE SULFATE 200 MG PO TABS Oral Take 200 mg by mouth 2 (two) times daily.     . MORPHINE SULFATE ER 60 MG PO TBCR Oral Take 60 mg by mouth 2 (two) times daily as needed. For pain.    Marland Kitchen OMEPRAZOLE 40 MG PO CPDR Oral Take 40 mg by mouth daily.    Marland Kitchen ONDANSETRON HCL 4 MG PO TABS Oral Take 4 mg by mouth every 8 (eight) hours as needed. For nausea.    . OXYCODONE-ACETAMINOPHEN 10-325 MG PO TABS Oral Take 1 tablet by mouth every 4 (four) hours as needed. For pain    . TRAZODONE HCL 100 MG PO TABS Oral Take 1 tablet (100 mg total) by mouth at bedtime. 30 tablet 0    BP 104/70  Pulse 88  Temp 99.1 F (37.3 C) (Oral)  Resp 25  SpO2 91%  Physical Exam  Nursing note and vitals reviewed. Constitutional: She is oriented to person, place, and time.       Obese, pale, ill-appearing female.  HENT:  Head: Normocephalic and atraumatic.  Eyes: Conjunctivae normal are normal. No scleral icterus.  Neck: No JVD present.  Cardiovascular: Normal rate, regular rhythm, normal heart sounds and intact distal pulses.     Pulmonary/Chest: Effort normal and breath sounds normal. No respiratory distress. She has no wheezes.       Shallow breathing. O2 sats 99% on 2 L oxygen via nasal cannula  Abdominal: Soft. Bowel sounds are normal. She exhibits no distension. There is tenderness (diffuse abdominal tenderness. Exquisitely tender to light. Palpation. Bowel sounds normal).       Exam limited due to patient intolerance of pressure on abdomen  Musculoskeletal: Normal range of motion.  Lymphadenopathy:    She has no cervical adenopathy.  Neurological: She is alert and oriented to person, place, and time.  Skin: Skin is warm. There is pallor.       Dry mucous membranes. pallor  Psychiatric: Her behavior is normal.    ED Course  Procedures (including critical care time)Ct Abdomen Pelvis Wo Contrast  04/04/2012  *RADIOLOGY REPORT*  Clinical Data: Abdominal pain periumbilical.  Hysterectomy. Cholecystectomy.  CT ABDOMEN AND PELVIS WITHOUT CONTRAST  Technique:  Multidetector CT imaging of the abdomen and pelvis was performed following the standard protocol without intravenous contrast.  Comparison: Renal ultrasound 02/23/2012.  Findings: Clear lung bases.  Minimal bronchiectasis at the lung bases which is likely post infectious or inflammatory.  Mild cardiomegaly.  Small hiatal hernia with a contrast filled mildly dilated lower thoracic esophagus.  Small volume free intraperitoneal air.  Mild hepatic steatosis. Normal spleen, stomach.  Fatty replaced pancreas. Cholecystectomy without biliary ductal dilatation.  Normal adrenal glands and kidneys.  Small retroperitoneal nodes, without adenopathy.  There is extensive colonic diverticulosis.  There is a moderate amount of stool within the proximal sigmoid with apparent mild wall thickening of the distal sigmoid.  Example images 67 and 71 respectively.  There is inflammation surround the proximal sigmoid and distal descending colon.  No well-defined fluid collection to suggest  abscess.  The remainder of the colon is normal in caliber. Normal terminal ileum and appendix.  Normal caliber of small bowel loops.  Small volume ascites within the anterior pelvis and left lower quadrant.  Small pelvic sidewall nodes bilaterally. Normal urinary bladder. Hysterectomy.  No adnexal mass.   No acute osseous abnormality.  IMPRESSION:  1.  Free intraperitoneal air, secondary to inflammation centered around the distal descending and proximal sigmoid colon.  Most likely diverticulitis. Critical test results telephoned to Dr. Denton Lank at the time of interpretation at 3:05 p.m. on 04/04/2012. 2.  Small hiatal hernia. Esophageal air fluid level suggests dysmotility or gastroesophageal reflux. 3.  Suspicion of constipation, including within the region of the inflamed sigmoid.   Original Report Authenticated By: Consuello Bossier, M.D.      Results for orders placed during the hospital encounter of 04/04/12  COMPREHENSIVE METABOLIC PANEL      Component Value Range   Sodium 130 (*) 135 - 145 mEq/L   Potassium 3.1 (*) 3.5 - 5.1 mEq/L   Chloride 86 (*) 96 - 112 mEq/L   CO2 30  19 - 32 mEq/L   Glucose, Bld 209 (*) 70 - 99 mg/dL   BUN 20  6 -  23 mg/dL   Creatinine, Ser 1.61 (*) 0.50 - 1.10 mg/dL   Calcium 8.8  8.4 - 09.6 mg/dL   Total Protein 7.2  6.0 - 8.3 g/dL   Albumin 2.7 (*) 3.5 - 5.2 g/dL   AST 14  0 - 37 U/L   ALT 26  0 - 35 U/L   Alkaline Phosphatase 216 (*) 39 - 117 U/L   Total Bilirubin 0.6  0.3 - 1.2 mg/dL   GFR calc non Af Amer 49 (*) >90 mL/min   GFR calc Af Amer 57 (*) >90 mL/min  CBC WITH DIFFERENTIAL      Component Value Range   WBC 18.9 (*) 4.0 - 10.5 K/uL   RBC 4.59  3.87 - 5.11 MIL/uL   Hemoglobin 12.6  12.0 - 15.0 g/dL   HCT 04.5  40.9 - 81.1 %   MCV 84.1  78.0 - 100.0 fL   MCH 27.5  26.0 - 34.0 pg   MCHC 32.6  30.0 - 36.0 g/dL   RDW 91.4  78.2 - 95.6 %   Platelets 508 (*) 150 - 400 K/uL   Neutrophils Relative 90 (*) 43 - 77 %   Neutro Abs 17.1 (*) 1.7 - 7.7 K/uL    Lymphocytes Relative 3 (*) 12 - 46 %   Lymphs Abs 0.6 (*) 0.7 - 4.0 K/uL   Monocytes Relative 7  3 - 12 %   Monocytes Absolute 1.2 (*) 0.1 - 1.0 K/uL   Eosinophils Relative 0  0 - 5 %   Eosinophils Absolute 0.0  0.0 - 0.7 K/uL   Basophils Relative 0  0 - 1 %   Basophils Absolute 0.0  0.0 - 0.1 K/uL   Labs significant for hyponatremia, hypochloremia, hypokalemia, elevated alkaline phosphatase. She appears to have acute kidney injury. Creatinine is elevated to 1.21 from 0.88 decreased GFR. Patient also has elevated white count as well as elevated. Platelets. His may be elevated due to acute phase reaction. Glucose is elevated today at 209. Giving the patient. IV fluids, Dilaudid and Zofran. Obtaining abdominal CT scan.  1. Diverticulitis of large intestine with perforation       MDM   3:52 PM Filed Vitals:   04/04/12 1531  BP: 102/59  Pulse: 106  Temp: 98.3 F (36.8 C)  Resp:     CTShows ruptured diverticulitis and free intrapelvic peritoneal air. Patient has been given by mouth potassium 4. Hypokalemia. She's been started on ertapenem.I talked to Dr. Janee Morn who is oncall for surgery, she has agreed to come and see the paitient for surgical consult.       Arthor Captain, PA-C 04/04/12 1559

## 2012-04-04 NOTE — Preoperative (Signed)
Beta Blockers   Reason not to administer Beta Blockers:Not Applicable 

## 2012-04-04 NOTE — ED Notes (Signed)
Bed:WA06<BR> Expected date:<BR> Expected time:<BR> Means of arrival:<BR> Comments:<BR> Abdominal pain

## 2012-04-04 NOTE — Op Note (Signed)
Surgeon: Vanita Panda   Assistants: Levora Angel  Anesthesia: General endotracheal anesthesia  Procedure: Left hemicolectomy with splenic flexure mobilization and end colostomy  EBL:  UOP: 30ml  Indications: this is a 56 year old female who presented to the emergency department this evening with complaints of diffuse abdominal pain. A CT was performed which showed free air intraperitoneally. Her abdominal exam was consistent with a free perforation. The risk and benefits of a Hartman's procedure were explained to the patient prior to the OR and consent was signed and placed on chart.  Findings: Severe constipation with stool a eroding through a necrotic sigmoid wall. Frank stool peritonitis.  Procedure: The patient was taken to the operating room and laid on the operating room table. General anesthesia was smoothly induced. The patient was prepped and draped in the usual sterile fashion. A surgical timeout was performed indicating the correct patient procedure positioning and preoperative antibiotics. SCDs were also noted to be in place prior to the initiation of anesthesia.  After this was completed a midline incision was made using a 10 blade scalpel. This was carried down through subcutaneous tissues using Bovie electrocautery. The fascia was identified a midline and incised using Bovie electrocautery. The peritoneum was identified and elevated with a Coker clamp and incised using Metzenbaum scissors. After this was completed the remaining fascia was opened down to the pubic bone and up above the umbilicus. The omentum was freed from the abdominal wall using Bovie electrocautery. Once we mobilized the omentum and small bowel out of the pelvis we identified the sigmoid colon. This appeared grossly necrotic with stool freely circulating within the left lower quadrant of the abdomen. We bluntly dissected around the sigmoid colon down into the pelvis until the rectosigmoid junction could  be identified. This was transected with a contour blue load stapler. We carefully dissected the mesentery and took this close to the colon using the LigaSure device. After this was completed we mobilized the left colon down the white line of Toldt. The splenic flexure was carefully taken down using a combination of blunt dissection and Bovie electrocautery. The omentum was then separated from the left side of the transverse colon and the entire left colon was then mobilized.  The transverse colon was then transected just distal to the midline.  This was done with a GIA blue load stapler.  Once this was completed we continued our dissection of the mesentery staying close to the colon. This was also done with the LigaSure device. After this was completed and the entire mesentery was transected the specimen was sent off the field to pathology for further examination. The remaining colon was mobilized taking the mesentery as low as possible. The transected transverse colon was mobilized off of the omentum. After this was completed we were able to mobilize the remaining colon to the abdominal wall for the ostomy. The abdomen was then irrigated with several liters of warm normal saline. All 4 quadrants were inspected and irrigated. We then infused antibiotic irrigation into the abdomen. A JP drain was then placed into the pelvis and left paracolic gutter. This was brought out through the skin. This was sutured into place using a nylon suture.   I then incised a portion of skin in the patient's left upper quadrant above her rectus muscle using Bovie electrocautery. This was carried down through subcutaneous tissues until the fascia was encountered. The fascia was incised in a cruciate manner and the rectus muscle was split and the peritoneum was  divided with Bovie electrocautery.  The end of the colon was brought up through a 2 fingerbreadth width defect. After this was completed the omentum was placed over the small  bowel and the fascia was closed using 0 Novafil interrupted sutures.  The subcutaneous tissue was then packed with a Kerlix roll. This was covered with a sterile dressing. The ostomy was then matured in standard [place] fashion. This was done using 2-0 Vicryl sutures. After this was completed an ostomy appliance was placed. I placed a 14 French red rubber into the patient's rectum and secured this with a Vicryl suture. This was done for decompression of the rectum. The patient was then transferred to the ICU in critical but stable condition. All counts were correct per operating room staff. I was present and personally performed the entire procedure. I have spoken to the patient's next of kin and instructed him that she will be in the ICU in critical condition for at least several days.

## 2012-04-04 NOTE — Transfer of Care (Signed)
Immediate Anesthesia Transfer of Care Note  Patient: Jasmine Huff  Procedure(s) Performed: Procedure(s) (LRB) with comments: EXPLORATORY LAPAROTOMY (N/A) - left colectomy COLOSTOMY (N/A)  Patient Location: ICU  Anesthesia Type: General  Level of Consciousness: sedated and unresponsive  Airway & Oxygen Therapy: Patient remains intubated per anesthesia plan and Patient placed on Ventilator (see vital sign flow sheet for setting)  Post-op Assessment: Post -op Vital signs reviewed and stable  Post vital signs: stable  Complications: No apparent anesthesia complications

## 2012-04-04 NOTE — ED Notes (Signed)
Per [facility], pt states that she has abdominal pain above and below her navel. Pt has hx of hypotensive episodes. GLASCOW 15. Alert/oriented. Pt states that pain was the worst pain she has ever had in her life.

## 2012-04-05 ENCOUNTER — Encounter (HOSPITAL_COMMUNITY): Payer: Self-pay

## 2012-04-05 ENCOUNTER — Inpatient Hospital Stay (HOSPITAL_COMMUNITY): Payer: Medicare Other

## 2012-04-05 DIAGNOSIS — N179 Acute kidney failure, unspecified: Secondary | ICD-10-CM

## 2012-04-05 DIAGNOSIS — K659 Peritonitis, unspecified: Secondary | ICD-10-CM

## 2012-04-05 DIAGNOSIS — K631 Perforation of intestine (nontraumatic): Secondary | ICD-10-CM

## 2012-04-05 DIAGNOSIS — K668 Other specified disorders of peritoneum: Secondary | ICD-10-CM

## 2012-04-05 DIAGNOSIS — J9589 Other postprocedural complications and disorders of respiratory system, not elsewhere classified: Secondary | ICD-10-CM

## 2012-04-05 DIAGNOSIS — J95821 Acute postprocedural respiratory failure: Secondary | ICD-10-CM | POA: Diagnosis not present

## 2012-04-05 DIAGNOSIS — A419 Sepsis, unspecified organism: Secondary | ICD-10-CM | POA: Diagnosis present

## 2012-04-05 DIAGNOSIS — R6520 Severe sepsis without septic shock: Secondary | ICD-10-CM

## 2012-04-05 LAB — GLUCOSE, CAPILLARY: Glucose-Capillary: 200 mg/dL — ABNORMAL HIGH (ref 70–99)

## 2012-04-05 LAB — BASIC METABOLIC PANEL
BUN: 32 mg/dL — ABNORMAL HIGH (ref 6–23)
Chloride: 92 mEq/L — ABNORMAL LOW (ref 96–112)
Chloride: 97 mEq/L (ref 96–112)
Creatinine, Ser: 1.69 mg/dL — ABNORMAL HIGH (ref 0.50–1.10)
Creatinine, Ser: 2.03 mg/dL — ABNORMAL HIGH (ref 0.50–1.10)
GFR calc Af Amer: 38 mL/min — ABNORMAL LOW (ref >90)
GFR calc non Af Amer: 26 mL/min — ABNORMAL LOW (ref >90)
Glucose, Bld: 156 mg/dL — ABNORMAL HIGH (ref 70–99)
Potassium: 3.9 mEq/L (ref 3.5–5.1)
Sodium: 130 mEq/L — ABNORMAL LOW (ref 135–145)

## 2012-04-05 LAB — CARBOXYHEMOGLOBIN
Methemoglobin: 1 % (ref 0.0–1.5)
Total hemoglobin: 12.8 g/dL (ref 12.0–16.0)

## 2012-04-05 LAB — CBC
MCH: 27.1 pg (ref 26.0–34.0)
MCHC: 32.6 g/dL (ref 30.0–36.0)
Platelets: 485 10*3/uL — ABNORMAL HIGH (ref 150–400)
RBC: 4.57 MIL/uL (ref 3.87–5.11)

## 2012-04-05 LAB — PROTIME-INR
INR: 1.42 (ref 0.00–1.49)
Prothrombin Time: 17 seconds — ABNORMAL HIGH (ref 11.6–15.2)

## 2012-04-05 LAB — MAGNESIUM: Magnesium: 1.6 mg/dL (ref 1.5–2.5)

## 2012-04-05 LAB — PHOSPHORUS: Phosphorus: 4.1 mg/dL (ref 2.3–4.6)

## 2012-04-05 MED ORDER — INSULIN ASPART 100 UNIT/ML ~~LOC~~ SOLN
2.0000 [IU] | SUBCUTANEOUS | Status: DC
  Administered 2012-04-05 (×2): 4 [IU] via SUBCUTANEOUS
  Administered 2012-04-05 (×2): 2 [IU] via SUBCUTANEOUS
  Administered 2012-04-05: 4 [IU] via SUBCUTANEOUS
  Administered 2012-04-06 (×2): 2 [IU] via SUBCUTANEOUS

## 2012-04-05 MED ORDER — NOREPINEPHRINE BITARTRATE 1 MG/ML IJ SOLN
2.0000 ug/min | INTRAVENOUS | Status: DC | PRN
  Filled 2012-04-05: qty 4

## 2012-04-05 MED ORDER — CHLORHEXIDINE GLUCONATE 0.12 % MT SOLN
15.0000 mL | Freq: Two times a day (BID) | OROMUCOSAL | Status: DC
  Administered 2012-04-05 – 2012-04-08 (×8): 15 mL via OROMUCOSAL
  Filled 2012-04-05 (×12): qty 15

## 2012-04-05 MED ORDER — BIOTENE DRY MOUTH MT LIQD
15.0000 mL | Freq: Four times a day (QID) | OROMUCOSAL | Status: DC
  Administered 2012-04-05 – 2012-04-09 (×13): 15 mL via OROMUCOSAL

## 2012-04-05 MED ORDER — FUROSEMIDE 10 MG/ML IJ SOLN
40.0000 mg | Freq: Once | INTRAMUSCULAR | Status: AC
  Administered 2012-04-05: 40 mg via INTRAVENOUS
  Filled 2012-04-05: qty 4

## 2012-04-05 MED ORDER — SODIUM CHLORIDE 0.9 % IV SOLN
25.0000 ug/h | INTRAVENOUS | Status: DC
  Administered 2012-04-05: 250 ug/h via INTRAVENOUS
  Administered 2012-04-05: 50 ug/h via INTRAVENOUS
  Filled 2012-04-05 (×3): qty 50

## 2012-04-05 MED ORDER — PNEUMOCOCCAL VAC POLYVALENT 25 MCG/0.5ML IJ INJ
0.5000 mL | INJECTION | INTRAMUSCULAR | Status: AC
  Filled 2012-04-05: qty 0.5

## 2012-04-05 MED ORDER — DOBUTAMINE IN D5W 4-5 MG/ML-% IV SOLN: 2.5000 ug/kg/min | INTRAVENOUS | Status: DC | PRN

## 2012-04-05 MED ORDER — MIDAZOLAM HCL 5 MG/ML IJ SOLN
2.0000 mg | INTRAMUSCULAR | Status: DC | PRN
  Administered 2012-04-05: 2 mg via INTRAVENOUS
  Filled 2012-04-05: qty 1

## 2012-04-05 MED ORDER — SODIUM CHLORIDE 0.9 % IV BOLUS (SEPSIS): 500.0000 mL | INTRAVENOUS | Status: DC | PRN

## 2012-04-05 MED ORDER — SODIUM CHLORIDE 0.9 % IV SOLN
2.0000 mg/h | INTRAVENOUS | Status: DC
  Administered 2012-04-05: 4 mg/h via INTRAVENOUS
  Administered 2012-04-05: 2 mg/h via INTRAVENOUS
  Administered 2012-04-05: 6 mg/h via INTRAVENOUS
  Filled 2012-04-05 (×4): qty 10

## 2012-04-05 MED ORDER — PANTOPRAZOLE SODIUM 40 MG IV SOLR
40.0000 mg | Freq: Every day | INTRAVENOUS | Status: DC
  Administered 2012-04-05 – 2012-04-11 (×8): 40 mg via INTRAVENOUS
  Filled 2012-04-05 (×11): qty 40

## 2012-04-05 MED ORDER — SODIUM CHLORIDE 0.9 % IV BOLUS (SEPSIS): 500.0000 mL | Freq: Once | INTRAVENOUS | Status: DC

## 2012-04-05 MED ORDER — INFLUENZA VIRUS VACC SPLIT PF IM SUSP
0.5000 mL | INTRAMUSCULAR | Status: AC
  Filled 2012-04-05: qty 0.5

## 2012-04-05 MED ORDER — MIDAZOLAM BOLUS VIA INFUSION
1.0000 mg | INTRAVENOUS | Status: DC | PRN
  Filled 2012-04-05: qty 2

## 2012-04-05 NOTE — Progress Notes (Signed)
CARE MANAGEMENT NOTE 04/05/2012  Patient:  Jasmine Huff, Jasmine Huff   Account Number:  [id number]  Date Initiated:  04/05/2012  Documentation initiated by:  DAVIS,RHONDA  Subjective/Objective Assessment:   pt with perforated colon and requiring surgical intervention, in icu post-op due to inability to wean from the vent.  pre-op fibro-myalgia, arthritis, took pain meds for.  Chronic conspitation.     Action/Plan:   Patient has living with brother but on [phone] he has told her that she can not return to his house due to the level of care she needs.   Anticipated DC Date:  04/08/2012   Anticipated DC Plan:  HOME/SELF CARE  In-house referral  Clinical Social Worker      DC Planning Services  CM consult      [facility] Choice  NA   Choice offered to / List presented to:  NA   DME arranged  NA      DME agency  NA     HH arranged  NA      HH agency  NA   Status of service:  In process, will continue to follow Medicare Important Message given?  YES (If response is "NO", the following Medicare IM given date fields will be blank) Date Medicare IM given:  04/04/2012 Date Additional Medicare IM given:    Discharge Disposition:    Per UR Regulation:  Reviewed for med. necessity/level of care/duration of stay  If discussed at Long Length of Stay Meetings, dates discussed:    Comments:  [id number]/Rhonda Earlene Plater, RN, BSN, CCM: CHART REVIEWED AND UPDATED. NO DISCHARGE NEEDS PRESENT AT THIS TIME. CASE MANAGEMENT [phone]

## 2012-04-05 NOTE — Anesthesia Postprocedure Evaluation (Signed)
  Anesthesia Post-op Note  Patient: Jasmine Huff  Procedure(s) Performed: Procedure(s) (LRB) with comments: EXPLORATORY LAPAROTOMY (N/A) - left colectomy COLOSTOMY (N/A)  Patient Location: PACU  Anesthesia Type: General  Level of Consciousness: sedated, patient cooperative and responds to stimulation  Airway and Oxygen Therapy: Patient placed on Ventilator (see vital sign flow sheet for setting)  Post-op Pain: mild  Post-op Assessment: Post-op Vital signs reviewed and Patient's Cardiovascular Status Stable  Post-op Vital Signs: stable  Complications: No apparent anesthesia complications  Minimal urine output. Physicians aware, no fluid boluses at this time. O2 sat 96 % HR 107, BP 112/57. Remains on vent, FIO2 30%, not o wean today.  Easily arousable

## 2012-04-05 NOTE — Progress Notes (Addendum)
INITIAL ADULT NUTRITION ASSESSMENT Date: 04/05/2012   Time: 10:19 AM Reason for Assessment: vent  ASSESSMENT: Female 56 y.o.  Dx: Pt intubated s/p R hemicolectomy.  Severe constipation with stool eroding through a necrotic sigmoid wall.  Frank stool peritonitis.  Hx:  Past Medical History  Diagnosis Date  . Arthritis   . Depression   . Allergy   . Hypertension   . Migraine   . UTI (lower urinary tract infection)   . Fibromyalgia   . Chronic pain syndrome   . GERD (gastroesophageal reflux disease) 01/21/2011  . Anxiety   . Panic attacks   . High blood pressure   . Knee pain   . Urination frequency   . Diabetes mellitus type II    Past Surgical History  Procedure Date  . Cholecystectomy   . Abdominal hysterectomy   . Right ovary removal   . Left hemicolectomy with splenic flexure mobilization and end colostomy 04/04/2012    Related Meds:     . antiseptic oral rinse  15 mL Mouth Rinse QID  . chlorhexidine  15 mL Mouth Rinse BID  . clindamycin / gentamicin INTRAPERITONEAL Lavage irrigation   Intraperitoneal To OR  . docusate sodium  100 mg Oral BID  . ertapenem  1 g Intravenous Once  . ertapenem (INVANZ) IV  1 g Intravenous Q24H  . furosemide  40 mg Intravenous Once  . heparin  5,000 Units Subcutaneous Q8H  . HYDROmorphone      .  HYDROmorphone (DILAUDID) injection  1 mg Intravenous Once  . influenza  inactive virus vaccine  0.5 mL Intramuscular Tomorrow-1000  . insulin aspart  2-6 Units Subcutaneous Q4H  . micafungin (MYCAMINE) IV  100 mg Intravenous Q24H  . ondansetron (ZOFRAN) IV  4 mg Intravenous Once  . pantoprazole (PROTONIX) IV  40 mg Intravenous QHS  . pneumococcal 23 valent vaccine  0.5 mL Intramuscular Tomorrow-1000  . potassium chloride SA  40 mEq Oral Once  . sodium chloride  1,000 mL Intravenous Once  . sodium chloride  1,000 mL Intravenous Once  . DISCONTD: midazolam  2-4 mg Intravenous Once  . DISCONTD: ondansetron  4 mg Oral Once  . DISCONTD:  sodium chloride  500 mL Intravenous Once   Ht: 5' (152.4 cm)  Wt: 302 lb 7.5 oz (137.2 kg)  Ideal Wt: 45.5 kg  % Ideal Wt: 300  Usual Wt:  Wt Readings from Last 10 Encounters:  04/05/12 302 lb 7.5 oz (137.2 kg)  04/05/12 302 lb 7.5 oz (137.2 kg)  03/24/12 298 lb 6.4 oz (135.353 kg)  02/24/12 301 lb (136.533 kg)  12/31/11 301 lb (136.533 kg)  10/27/11 300 lb (136.079 kg)  10/17/11 315 lb (142.883 kg)  09/17/11 316 lb (143.337 kg)  09/01/11 318 lb (144.244 kg)  02/19/11 293 lb (132.904 kg)    % Usual Wt: 103  Body mass index is 59.07 kg/(m^2).  Extreme Obesisty   Labs:  CMP     Component Value Date/Time   NA 130* 04/05/2012 0210   K 3.7 04/05/2012 0210   CL 92* 04/05/2012 0210   CO2 29 04/05/2012 0210   GLUCOSE 212* 04/05/2012 0210   BUN 25* 04/05/2012 0210   CREATININE 1.69* 04/05/2012 0210   CREATININE 0.79 10/21/2011 0925   CALCIUM 7.9* 04/05/2012 0210   PROT 7.2 04/04/2012 1206   ALBUMIN 2.7* 04/04/2012 1206   AST 14 04/04/2012 1206   ALT 26 04/04/2012 1206   ALKPHOS 216* 04/04/2012 1206   BILITOT  0.6 04/04/2012 1206   GFRNONAA 33* 04/05/2012 0210   GFRAA 38* 04/05/2012 0210    I/O last 3 completed shifts: In: 5381.8 [I.V.:4021.8; Other:750; IV Piggyback:610] Out: 1288 [Urine:308; Emesis/NG output:550; Drains:180; Other:150; Blood:100]     Diet Order: NPO  Supplements/Tube Feeding:  none  IVF:    sodium chloride Last Rate: 150 mL/hr (04/05/12 0235)  fentaNYL infusion INTRAVENOUS Last Rate: 50 mcg/hr (04/05/12 6203)  midazolam (VERSED) infusion Last Rate: 4 mg/hr (04/05/12 0430)    Estimated Nutritional Needs:   Kcal: 2180 Protein: 95-105 gm Fluid: >2L  Food/Nutrition Related Hx: Pt reports a decreased intake over the past 6 weeks due to stress, decreased appetite and not feeling well.  Regular diet    Wt meets criteria for extreme obesity.  At risk for malnutrition related to NPO status and decreased intake prior to admit.  NG tube in place, Urine Output is  poor today.  NUTRITION DIAGNOSIS: -Inadequate oral intake (NI-2.1).  Status: Ongoing  RELATED TO: inability to eat  AS EVIDENCE BY: npo status  MONITORING/EVALUATION(Goals): Goal:  Enteral nutrition to provide 60-70% of estimated calorie needs (22-25 kcals/kg ideal body weight) and >/= 90% of estimated protein needs, based on ASPEN guidelines for permissive underfeeding in critically ill obese individuals.  Monitor:  Nutrition plan of care, pt status, labs, weight  EDUCATION NEEDS: -Education not appropriate at this time  INTERVENTION: Consider TPN  Dietitian #:Laura Derrell Lolling, RD, LDN Clinical Inpatient Dietitian Pager:  [phone] Weekend and after hours pager:  [phone]   DOCUMENTATION CODES Per approved criteria  -Morbid Obesity    Jasmine Huff 04/05/2012, 10:19 AM

## 2012-04-05 NOTE — Progress Notes (Signed)
Name: Jasmine Huff MRN: [id number] DOB: [birth year]    LOS: 1  PULMONARY / CRITICAL CARE MEDICINE  Brief Patient description: 55 yo female with hypertension, diabetes and depression who underwent a R hemicolectomy on 9/29 for perforated diverticulitis.  PCCM consulted for vent and medical management.  Current Status: comfortable on vent  Vital Signs: Temp:  [98.3 F (36.8 C)-100.9 F (38.3 C)] 99 F (37.2 C) (09/30 0800) Pulse Rate:  [35-126] 112  (09/30 0800) Resp:  [14-25] 21  (09/30 0800) BP: (89-129)/(48-84) 109/84 mmHg (09/30 0800) SpO2:  [56 %-100 %] 98 % (09/30 0800) FiO2 (%):  [2 %-50 %] 30 % (09/30 0800) Weight:  [135.5 kg (298 lb 11.6 oz)-137.2 kg (302 lb 7.5 oz)] 137.2 kg (302 lb 7.5 oz) (09/30 0500)  Physical Examination: Gen: intubated, breathing comfortably HEENT: NCAT, PERRL, ETT in place PULM: CTA B CV: RRR, no mgr AB: no bowel sounds, colectomy in place, incisions well dressed Ext: warm, trace edema Neuro: awake and alert on vent, following commands, lifts head off pillow   ASSESSMENT AND PLAN  PULMONARY  Lab 04/05/12 0043 04/04/12 2233  PHART -- 7.364  PCO2ART -- 49.1*  PO2ART -- 186.0*  HCO3 -- 27.3*  O2SAT 71.1 99.2   Ventilator Settings: Vent Mode:  [-] PRVC FiO2 (%):  [2 %-50 %] 30 % Set Rate:  [14 bmp] 14 bmp Vt Set:  [420 mL] 420 mL PEEP:  [5 cmH20] 5 cmH20 Plateau Pressure:  [16 cmH20-25 cmH20] 16 cmH20  CXR: increasing pulmonary edema bilaterally ETT: 9/29 >>  A:   1) Intubated on mechanical ventilation after left hemicolectomy for perforated sigmoid; breathing comfortably 9/30, some pulmonary edema P:   - Mechanical ventilation   - PRVC, Vt: 8cc/kg, PEEP: 5, RR: 18/min, FiO2 40% to keep Sat >92% - VAP prevention order set. - SBT this morning  CARDIOVASCULAR  Lab 04/04/12 2220  TROPONINI --  LATICACIDVEN 2.4*  PROBNP --   ECG:  Will order Lines: Right IJ placed 04/04/12  A:  1) Sepsis secondary to sigmoid perforation  and peritonitis, not in shock 2) Elevated lactic acid due to sepsis P:  - Early goal directed therapy per sepsis protocol d/c'd as not in shock - repeat lactic acid this morning - lasix x1 now given normal blood pressure and low UOP and positive fluid balance  RENAL  Lab 04/05/12 0210 04/04/12 2220 04/04/12 1206  NA 130* 130* 130*  K 3.7 3.4* --  CL 92* 92* 86*  CO2 29 27 30   BUN 25* 23 20  CREATININE 1.69* 1.48* 1.21*  CALCIUM 7.9* 7.8* 8.8  MG 1.6 1.4* --  PHOS 4.1 3.1 --   Intake/Output      09/29 0701 - 09/30 0700 09/30 0701 - 10/01 0700   I.V. (mL/kg) 4021.8 (29.3)    Other 750    IV Piggyback 610    Total Intake(mL/kg) 5381.8 (39.2)    Urine (mL/kg/hr) 308 (0.1)    Emesis/NG output 550    Drains 180    Other 150    Blood 100    Total Output 1288    Net +4093.8          Foley:  04/04/12  A:   1) Acute renal failure secondary to sepsis; now appears volume up, not in shock P:   - lasix x1 now - monitor UOP closely - repeat BMET later today - KVO fluids  GASTROINTESTINAL  Lab 04/04/12 1206  AST 14  ALT 26  ALKPHOS 216*  BILITOT 0.6  PROT 7.2  ALBUMIN 2.7*    A:   1) Sigmoid perforation post left hemicolectomy and end ileostomy. P:   - Management per surgical team. - Ertapenem / Micafungin   HEMATOLOGIC  Lab 04/05/12 0210 04/04/12 2220 04/04/12 1206  HGB 12.4 11.9* 12.6  HCT 38.0 36.7 38.6  PLT 485* 466* 508*  INR 1.42 -- --  APTT -- -- --   A:   1) No issues P:  - Will follow CBC  INFECTIOUS  Lab 04/05/12 0210 04/04/12 2220 04/04/12 1206  WBC 14.5* 8.7 18.9*  PROCALCITON -- 13.08 --   Cultures: 9/29 blood >> Antibiotics: 9/29 Ertapenem >> 9/29 micafungin >>  A:   1) Sepsis secondary to sigmoid perforation and peritonitis P:   - d/c Sepsis protocol - Antibiotics as above  ENDOCRINE  Lab 04/05/12 0755 04/05/12 0343 04/05/12 0007 04/04/12 1855  GLUCAP 174* 200* 174* 189*   A:   1) DM  P:   - ICU hyperglycemia  protocol  NEUROLOGIC  A:   1) Intubated, sedated P:   - Continuous sedation with Versed and Fentanyl hold 9/30 for SBT  BEST PRACTICE / DISPOSITION - Level of Care:  ICU - Primary Service:  Surgery - Consultants:  PCCM - Code Status:  Full code - Diet:  NPO - DVT Px:  SCD's - GI Px:  Protonix - Skin Integrity:  No ulcers   The patient is critically ill with multiple organ systems failure and requires high complexity decision making for assessment and support, frequent evaluation and titration of therapies, application of advanced monitoring technologies and extensive interpretation of multiple databases.   Critical Care Time devoted to patient care services described in this note is: 35 minutes  Yolonda Kida PCCM Pager: [phone] Cell: [phone] If no response, call [phone]

## 2012-04-05 NOTE — Consult Note (Signed)
Name: Jasmine Huff MRN: [id number] DOB: May 30, 1956    LOS: 1  PULMONARY / CRITICAL CARE MEDICINE  HPI:   56 years old female with PMH relevant for morbid obesity, DM, HTN, GERD, fibromyalgia. Bed bound since 2 months ago. Presented to the ED with severe abdominal pain. A CT of the abdomen revealed free intraperitoneal air with inflammation of the sigmoid colon. She was taken to the OR and found to have a perforated necrotic sigmoid with frank stool peritonitis. She underwent left hemicolectomy with splenic flexure mobilization and end colostomy. She was taken to the ICU intubated and sedated. Critical care consult called for medical management. At the time of my examination the patient is intubated, sedated, hemodynamically stable, synchronous with the vent and comfortable.    Past Medical History  Diagnosis Date  . Arthritis   . Depression   . Allergy   . Hypertension   . Migraine   . UTI (lower urinary tract infection)   . Fibromyalgia   . Chronic pain syndrome   . GERD (gastroesophageal reflux disease) 01/21/2011  . Anxiety   . Panic attacks   . High blood pressure   . Knee pain   . Urination frequency   . Diabetes mellitus type II    Past Surgical History  Procedure Date  . Cholecystectomy   . Abdominal hysterectomy   . Right ovary removal    Prior to Admission medications   Medication Sig Start Date End Date Taking? Authorizing Provider  aspirin EC 81 MG tablet Take 81 mg by mouth daily.   Yes Historical Provider, MD  cetirizine (ZYRTEC) 10 MG tablet Take 10 mg by mouth at bedtime.    Yes Historical Provider, MD  clonazePAM (KLONOPIN) 0.5 MG tablet Take 0.5 mg by mouth 3 (three) times daily as needed. For anxiety/rest   Yes Historical Provider, MD  escitalopram (LEXAPRO) 20 MG tablet Take 1.5 tablets (30 mg total) by mouth daily. 01/23/12 04/22/12 Yes Cleotis Nipper, MD  Ferrous Gluconate (IRON) 240 (27 FE) MG TABS Take 1 tablet by mouth daily.    Yes Historical Provider, MD    hydrochlorothiazide (HYDRODIURIL) 25 MG tablet Take 25 mg by mouth daily.  03/01/12  Yes Historical Provider, MD  hydroxychloroquine (PLAQUENIL) 200 MG tablet Take 200 mg by mouth 2 (two) times daily.  08/04/11  Yes Historical Provider, MD  morphine (MS CONTIN) 60 MG 12 hr tablet Take 60 mg by mouth 2 (two) times daily as needed. For pain. 01/22/12  Yes Historical Provider, MD  omeprazole (PRILOSEC) 40 MG capsule Take 40 mg by mouth daily.   Yes Historical Provider, MD  ondansetron (ZOFRAN) 4 MG tablet Take 4 mg by mouth every 8 (eight) hours as needed. For nausea. 03/15/12  Yes Historical Provider, MD  oxyCODONE-acetaminophen (PERCOCET) 10-325 MG per tablet Take 1 tablet by mouth every 4 (four) hours as needed. For pain   Yes Historical Provider, MD  traZODone (DESYREL) 100 MG tablet Take 1 tablet (100 mg total) by mouth at bedtime. 01/23/12  Yes Cleotis Nipper, MD   Allergies Allergies  Allergen Reactions  . Pregabalin Rash    Alters Mental status, flushing  . Saphris (Asenapine Maleate) Shortness Of Breath and Other (See Comments)    Lips tingle and tongue becomes numb/Akasthesia  . Iodinated Diagnostic Agents     Dye used for CT scans, severe hives  . Seroquel (Quetiapine Fumarate)     Family History Family History  Problem Relation Age of Onset  .  Depression Mother   . Depression Father   . Alcohol abuse Brother   . Depression Brother   . Depression Maternal Grandfather   . Depression Other    Social History  reports that she has never smoked. She has never used smokeless tobacco. She reports that she does not drink alcohol or use illicit drugs.  Review Of Systems:  Unable to provide.   Current Status:  Vital Signs: Temp:  [98.3 F (36.8 C)-100.9 F (38.3 C)] 100.9 F (38.3 C) (09/30 0030) Pulse Rate:  [88-126] 122  (09/30 0100) Resp:  [14-25] 22  (09/30 0100) BP: (89-129)/(48-83) 94/68 mmHg (09/30 0100) SpO2:  [91 %-100 %] 97 % (09/30 0100) FiO2 (%):  [2 %-50 %] 30 %  (09/30 0037) Weight:  [298 lb 11.6 oz (135.5 kg)] 298 lb 11.6 oz (135.5 kg) (09/29 2143)  Physical Examination: General:  Intubated, mechanically ventilated, no acute distress Neuro:  Sedated, synchronous, nonfocal HEENT:  PERRL, pink conjunctivae, moist membranes Neck:  Supple, no JVD   Cardiovascular:  RRR, no M/R/G Lungs:  Bilateral diminished air entry, no W/R/R Abdomen:  Surgical wounds covered. Colostomy. Musculoskeletal:  Moves all extremities, no pedal edema Skin:  No rash    ASSESSMENT AND PLAN  PULMONARY  Lab 04/05/12 0043 04/04/12 2233  PHART -- 7.364  PCO2ART -- 49.1*  PO2ART -- 186.0*  HCO3 -- 27.3*  O2SAT 71.1 99.2   Ventilator Settings: Vent Mode:  [-] PRVC FiO2 (%):  [2 %-50 %] 30 % Set Rate:  [14 bmp] 14 bmp Vt Set:  [420 mL] 420 mL PEEP:  [5 cmH20] 5 cmH20 Plateau Pressure:  [19 cmH20-25 cmH20] 19 cmH20  CXR: No acute infiltrates. ETT:  Adequate position.  A:   1) Intubated on mechanical ventilation after left hemicolectomy for perforated sigmoid. P:   - Mechanical ventilation   - PRVC, Vt: 8cc/kg, PEEP: 5, RR: 18/min, FiO2 40% to keep Sat >92% - VAP prevention order set.   CARDIOVASCULAR  Lab 04/04/12 2220  TROPONINI --  LATICACIDVEN 2.4*  PROBNP --   ECG:  Will order Lines: Right IJ placed 04/04/12  A:  1) Sepsis secondary to sigmoid perforation and peritonitis 2) Elevated lactic acid  P:  - Early goal directed therapy per sepsis protocol.  RENAL  Lab 04/04/12 2220 04/04/12 1206  NA 130* 130*  K 3.4* 3.1*  CL 92* 86*  CO2 27 30  BUN 23 20  CREATININE 1.48* 1.21*  CALCIUM 7.8* 8.8  MG 1.4* --  PHOS 3.1 --   Intake/Output      09/29 0701 - 09/30 0700   I.V. (mL/kg) 3500 (25.8)   Other 600   IV Piggyback 600   Total Intake(mL/kg) 4700 (34.7)   Urine (mL/kg/hr) 250 (0.1)   Other 150   Blood 100   Total Output 500   Net +4200        Foley:  04/04/12  A:   1) Acute renal failure secondary to sepsis P:   - IVF  resuscitation per sepsis protocol - Monitor CMP  GASTROINTESTINAL  Lab 04/04/12 1206  AST 14  ALT 26  ALKPHOS 216*  BILITOT 0.6  PROT 7.2  ALBUMIN 2.7*    A:   1) Sigmoid perforation post left hemicolectomy and end ileostomy. P:   - Management per surgical team. - Ertapenem / Micafungin   HEMATOLOGIC  Lab 04/04/12 2220 04/04/12 1206  HGB 11.9* 12.6  HCT 36.7 38.6  PLT 466* 508*  INR -- --  APTT -- --   A:   1) No issues P:  - Will follow CBC  INFECTIOUS  Lab 04/04/12 2220 04/04/12 1206  WBC 8.7 18.9*  PROCALCITON 13.08 --   Cultures: - Blood cultures sent Antibiotics: Ertapenem and micafungin (9/29)  A:   1) Sepsis secondary to sigmoid perforation and peritonitis P:   - Sepsis protocol - Antibiotics as above  ENDOCRINE  Lab 04/05/12 0007 04/04/12 1855  GLUCAP 174* 189*   A:   1) DM  P:   - ICU hyperglycemia protocol  NEUROLOGIC  A:   1) Intubated, sedated P:   - Continuous sedation with Versed and Fentanyl.  BEST PRACTICE / DISPOSITION - Level of Care:  ICU - Primary Service:  Surgery - Consultants:  PCCM - Code Status:  Full code - Diet:  NPO - DVT Px:  SCD's - GI Px:  Protonix - Skin Integrity:  No ulcers   The patient is critically ill with multiple organ systems failure and requires high complexity decision making for assessment and support, frequent evaluation and titration of therapies, application of advanced monitoring technologies and extensive interpretation of multiple databases.   Critical Care Time devoted to patient care services described in this note is: 1 Hour  Overton Mam, M.D. Pulmonary and Critical Care Medicine [facility] Pager: [phone]  04/05/2012, 1:09 AM

## 2012-04-05 NOTE — Procedures (Signed)
Central Venous Catheter Insertion Procedure Note Jasmine Huff [id number] 1955/11/06  Procedure: Insertion of Central Venous Catheter Indications: Assessment of intravascular volume, Drug and/or fluid administration and Frequent blood sampling  Procedure Details Consent: Unable to obtain consent because of altered level of consciousness. Time Out: Verified patient identification, verified procedure, site/side was marked, verified correct patient position, special equipment/implants available, medications/allergies/relevent history reviewed, required imaging and test results available.  Performed  Maximum sterile technique was used including antiseptics, cap, gloves, gown, hand hygiene, mask and sheet. Skin prep: Chlorhexidine; local anesthetic administered A antimicrobial bonded/coated triple lumen catheter was placed in the right internal jugular vein using the Seldinger technique.  Evaluation Blood flow good Complications: No apparent complications Patient did tolerate procedure well. Chest X-ray ordered to verify placement.  CXR: normal.  Overton Mam, M.D. Pulmonary and Critical Care Medicine Call E-link with questions [phone] 04/05/2012, 1:36 AM

## 2012-04-05 NOTE — Progress Notes (Signed)
1 Day Post-Op  Subjective: AWAKE ON VENT APPROPRIATE  Objective: Vital signs in last 24 hours: Temp:  [98.3 F (36.8 C)-100.9 F (38.3 C)] 99.1 F (37.3 C) (09/30 0400) Pulse Rate:  [35-126] 105  (09/30 0600) Resp:  [14-25] 15  (09/30 0600) BP: (89-129)/(48-83) 92/68 mmHg (09/30 0600) SpO2:  [56 %-100 %] 97 % (09/30 0600) FiO2 (%):  [2 %-50 %] 30 % (09/30 0400) Weight:  [298 lb 11.6 oz (135.5 kg)-302 lb 7.5 oz (137.2 kg)] 302 lb 7.5 oz (137.2 kg) (09/30 0500) Last BM Date:  (prior to admission)  Intake/Output from previous day: 09/29 0701 - 09/30 0700 In: 5381.8 [I.V.:4021.8; IV Piggyback:610] Out: 1288 [Urine:308; Emesis/NG output:550; Drains:180; Blood:100] Intake/Output this shift:    Incision/Wound:OSTOMY DARK BUT VIABLE. DRESSING INTACT  Lab Results:   Basename 04/05/12 0210 04/04/12 2220  WBC 14.5* 8.7  HGB 12.4 11.9*  HCT 38.0 36.7  PLT 485* 466*   BMET  Basename 04/05/12 0210 04/04/12 2220  NA 130* 130*  K 3.7 3.4*  CL 92* 92*  CO2 29 27  GLUCOSE 212* 193*  BUN 25* 23  CREATININE 1.69* 1.48*  CALCIUM 7.9* 7.8*   PT/INR  Basename 04/05/12 0210  LABPROT 17.0*  INR 1.42   ABG  Basename 04/04/12 2233  PHART 7.364  HCO3 27.3*    Studies/Results: Ct Abdomen Pelvis Wo Contrast  04/04/2012  *RADIOLOGY REPORT*  Clinical Data: Abdominal pain periumbilical.  Hysterectomy. Cholecystectomy.  CT ABDOMEN AND PELVIS WITHOUT CONTRAST  Technique:  Multidetector CT imaging of the abdomen and pelvis was performed following the standard protocol without intravenous contrast.  Comparison: Renal ultrasound 02/23/2012.  Findings: Clear lung bases.  Minimal bronchiectasis at the lung bases which is likely post infectious or inflammatory.  Mild cardiomegaly.  Small hiatal hernia with a contrast filled mildly dilated lower thoracic esophagus.  Small volume free intraperitoneal air.  Mild hepatic steatosis. Normal spleen, stomach.  Fatty replaced pancreas. Cholecystectomy  without biliary ductal dilatation.  Normal adrenal glands and kidneys.  Small retroperitoneal nodes, without adenopathy.  There is extensive colonic diverticulosis.  There is a moderate amount of stool within the proximal sigmoid with apparent mild wall thickening of the distal sigmoid.  Example images 67 and 71 respectively.  There is inflammation surround the proximal sigmoid and distal descending colon.  No well-defined fluid collection to suggest abscess.  The remainder of the colon is normal in caliber. Normal terminal ileum and appendix.  Normal caliber of small bowel loops.  Small volume ascites within the anterior pelvis and left lower quadrant.  Small pelvic sidewall nodes bilaterally. Normal urinary bladder. Hysterectomy.  No adnexal mass.   No acute osseous abnormality.  IMPRESSION:  1.  Free intraperitoneal air, secondary to inflammation centered around the distal descending and proximal sigmoid colon.  Most likely diverticulitis. Critical test results telephoned to Dr. Denton Lank at the time of interpretation at 3:05 p.m. on 04/04/2012. 2.  Small hiatal hernia. Esophageal air fluid level suggests dysmotility or gastroesophageal reflux. 3.  Suspicion of constipation, including within the region of the inflamed sigmoid.   Original Report Authenticated By: Consuello Bossier, M.D.    Dg Chest Port 1 View  04/05/2012  *RADIOLOGY REPORT*  Clinical Data: Central line placement  PORTABLE CHEST - 1 VIEW  Comparison: Earlier the same day  Findings: 2353 hours.  Endotracheal tube tip is 2.9 cm above the base of the carina. The NG tube passes into the stomach although the distal tip position is not  included on the film.  Right IJ central line tip projects at the distal SVC level.  No evidence for right-sided pneumothorax.  The lung volumes remain low. The cardiopericardial silhouette is enlarged.  Vascular congestion persists and there appears to be slight increase in interstitial pulmonary edema.  IMPRESSION: Right IJ  central line tip projects at the distal SVC level.  No evidence for pneumothorax.  Lung volumes with vascular congestion and probable progressing interstitial pulmonary edema.   Original Report Authenticated By: ERIC A. MANSELL, M.D.    Dg Chest Port 1 View  04/04/2012  *RADIOLOGY REPORT*  Clinical Data: Endotracheal tube placement  PORTABLE CHEST - 1 VIEW  Comparison: 02/23/2012  Findings: Endotracheal tube tip is 2.4 cm above the base of the carina. The NG tube passes into the stomach although the distal tip position is not included on the film.  Lung volumes are low. Vascular congestion noted. The cardiopericardial silhouette is enlarged.  IMPRESSION: Low volume film with cardiomegaly and pulmonary vascular congestion.  Endotracheal tube tip is 2.4 cm above the base of the carina.   Original Report Authenticated By: ERIC A. MANSELL, M.D.     Anti-infectives: Anti-infectives     Start     Dose/Rate Route Frequency Ordered Stop   04/05/12 1600   ertapenem (INVANZ) 1 g in sodium chloride 0.9 % 50 mL IVPB        1 g 100 mL/hr over 30 Minutes Intravenous Every 24 hours 04/04/12 2125     04/04/12 2300   micafungin (MYCAMINE) 100 mg in sodium chloride 0.9 % 100 mL IVPB        100 mg 100 mL/hr over 1 Hours Intravenous Every 24 hours 04/04/12 2230     04/04/12 1945   clindamycin (CLEOCIN) 900 mg, gentamicin (GARAMYCIN) 240 mg in sodium chloride 0.9 % 1,000 mL for intraperitoneal lavage         Intraperitoneal To Surgery 04/04/12 1931 04/04/12 2004   04/04/12 1530   ertapenem (INVANZ) 1 g in sodium chloride 0.9 % 50 mL IVPB        1 g 100 mL/hr over 30 Minutes Intravenous  Once 04/04/12 1516 04/04/12 1641          Assessment/Plan: s/p Procedure(s) (LRB) with comments: EXPLORATORY LAPAROTOMY (N/A) - left colectomy COLOSTOMY (N/A) VDRF  PER CCM DM  SSI ID  CONT ABX FEN IVF FOR NOW UNTIL G FUNCTION RETURNS DVT PREVENTION GERD  PROTONIX    LOS: 1 day    Jasai Sorg  A. 04/05/2012

## 2012-04-05 NOTE — Progress Notes (Signed)
eLink Physician-Brief Progress Note Patient Name: Jasmine Huff DOB: [birth year] MRN: [id number]  Date of Service  04/05/2012   HPI/Events of Note   Pt with septic shock s/p lap for peritonitis  eICU Interventions  Start EGDT protocol   Intervention Category Major Interventions: Shock - evaluation and management  Shan Levans 04/05/2012, 12:20 AM

## 2012-04-06 ENCOUNTER — Inpatient Hospital Stay (HOSPITAL_COMMUNITY): Payer: Medicare Other

## 2012-04-06 HISTORY — PX: COLON SURGERY: SHX602

## 2012-04-06 LAB — BASIC METABOLIC PANEL
BUN: 39 mg/dL — ABNORMAL HIGH (ref 6–23)
CO2: 29 mEq/L (ref 19–32)
Calcium: 7.9 mg/dL — ABNORMAL LOW (ref 8.4–10.5)
Creatinine, Ser: 1.89 mg/dL — ABNORMAL HIGH (ref 0.50–1.10)
GFR calc non Af Amer: 29 mL/min — ABNORMAL LOW (ref >90)
Glucose, Bld: 129 mg/dL — ABNORMAL HIGH (ref 70–99)
Sodium: 133 mEq/L — ABNORMAL LOW (ref 135–145)

## 2012-04-06 LAB — CBC WITH DIFFERENTIAL/PLATELET
Basophils Absolute: 0 10*3/uL (ref 0.0–0.1)
Basophils Relative: 0 % (ref 0–1)
Eosinophils Absolute: 0 10*3/uL (ref 0.0–0.7)
Eosinophils Absolute: 0 10*3/uL (ref 0.0–0.7)
HCT: 31.1 % — ABNORMAL LOW (ref 36.0–46.0)
Hemoglobin: 9.9 g/dL — ABNORMAL LOW (ref 12.0–15.0)
Lymphocytes Relative: 7 % — ABNORMAL LOW (ref 12–46)
Lymphs Abs: 1.2 10*3/uL (ref 0.7–4.0)
MCH: 26.7 pg (ref 26.0–34.0)
MCHC: 32.4 g/dL (ref 30.0–36.0)
MCHC: 31.8 g/dL (ref 30.0–36.0)
MCV: 82.4 fL (ref 78.0–100.0)
Monocytes Absolute: 1.2 10*3/uL — ABNORMAL HIGH (ref 0.1–1.0)
Monocytes Relative: 6 % (ref 3–12)
Neutro Abs: 18 10*3/uL — ABNORMAL HIGH (ref 1.7–7.7)
Neutrophils Relative %: 88 % — ABNORMAL HIGH (ref 43–77)
Neutrophils Relative %: 87 % — ABNORMAL HIGH (ref 43–77)
Platelets: 484 10*3/uL — ABNORMAL HIGH (ref 150–400)
RDW: 14.9 % (ref 11.5–15.5)
RDW: 15.1 % (ref 11.5–15.5)
WBC Morphology: INCREASED
WBC: 20.6 10*3/uL — ABNORMAL HIGH (ref 4.0–10.5)

## 2012-04-06 LAB — GLUCOSE, CAPILLARY
Glucose-Capillary: 130 mg/dL — ABNORMAL HIGH (ref 70–99)
Glucose-Capillary: 88 mg/dL (ref 70–99)

## 2012-04-06 MED ORDER — KETOCONAZOLE 2 % EX CREA
TOPICAL_CREAM | Freq: Two times a day (BID) | CUTANEOUS | Status: DC | PRN
  Administered 2012-04-11: 13:00:00 via TOPICAL
  Filled 2012-04-06 (×2): qty 15

## 2012-04-06 MED ORDER — POTASSIUM CHLORIDE 10 MEQ/100ML IV SOLN
10.0000 meq | INTRAVENOUS | Status: AC
  Administered 2012-04-06 (×4): 10 meq via INTRAVENOUS
  Filled 2012-04-06 (×3): qty 100

## 2012-04-06 MED ORDER — FENTANYL CITRATE 0.05 MG/ML IJ SOLN
25.0000 ug | INTRAMUSCULAR | Status: DC | PRN
  Administered 2012-04-06 (×2): 50 ug via INTRAVENOUS
  Administered 2012-04-06: 100 ug via INTRAVENOUS
  Administered 2012-04-07 – 2012-04-08 (×5): 50 ug via INTRAVENOUS
  Administered 2012-04-08: 25 ug via INTRAVENOUS
  Administered 2012-04-08 – 2012-04-09 (×7): 50 ug via INTRAVENOUS
  Filled 2012-04-06 (×16): qty 2

## 2012-04-06 MED ORDER — POTASSIUM CHLORIDE 10 MEQ/100ML IV SOLN
INTRAVENOUS | Status: AC
  Administered 2012-04-06: 10 meq via INTRAVENOUS
  Filled 2012-04-06: qty 100

## 2012-04-06 MED ORDER — FUROSEMIDE 10 MG/ML IJ SOLN
40.0000 mg | Freq: Two times a day (BID) | INTRAMUSCULAR | Status: DC
  Administered 2012-04-06 – 2012-04-07 (×3): 40 mg via INTRAVENOUS
  Filled 2012-04-06 (×4): qty 4

## 2012-04-06 MED ORDER — SODIUM CHLORIDE 0.9 % IV SOLN
1750.0000 mg | INTRAVENOUS | Status: DC
  Administered 2012-04-06 – 2012-04-08 (×2): 1750 mg via INTRAVENOUS
  Filled 2012-04-06 (×5): qty 1750

## 2012-04-06 NOTE — Procedures (Signed)
Extubation Procedure Note  Patient Details:   Name: Jasmine Huff DOB: [birth year] MRN: [id number]   Airway Documentation:  AIRWAYS 7.5 mm (Active)  Secured at (cm) 21 cm 04/04/2012 12:00 AM     Airway 7.5 mm (Active)  Secured at (cm) 22 cm 04/06/2012  7:45 AM  Measured From Lips 04/06/2012  7:45 AM  Secured Location Right 04/06/2012  7:45 AM  Secured By [employer] 04/06/2012  7:45 AM  Tube Holder Repositioned Yes 04/06/2012  7:45 AM  Cuff Pressure (cm H2O) 22 cm H2O 04/06/2012  7:45 AM    Evaluation  O2 sats: stable throughout Complications: No apparent complications Patient did tolerate procedure well. Bilateral Breath Sounds: Clear;Diminished   Yes  Kyra Leyland M 04/06/2012, 9:30 AM

## 2012-04-06 NOTE — Consult Note (Signed)
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  Stoma intact to left lower quad. Stomal assessment/size: 1 3/4 inches oval, red and viable, above skin level. Peristomal assessment: Intact skin surrounding  Output  50cc red liquid.  No stool or flatus. Ostomy pouching: 1pc with barrier ring..  Education provided: Previous pouch leaking behind barrier.  Stoma located in deep crease at 3 o'clock and 9 o'clock.  Applied barrier ring and one piece flexible pouch.  Demonstrated pouch application to pt.  She is slightly sedated and did not participated, but watched.  Unable to talk at this time and ask questions.  No family members at bedside.  Educational materials left in room and supplies ordered for bedside staff.  WOC team will continue to follow for further teaching sessions when pt stable and out of ICU.  Cammie Mcgee, RN, MSN, [employer]  [phone]

## 2012-04-06 NOTE — Progress Notes (Signed)
ANTIBIOTIC CONSULT NOTE - INITIAL  Pharmacy Consult for Vancomycin Indication: pneumonia  Allergies  Allergen Reactions  . Pregabalin Rash    Alters Mental status, flushing  . Saphris (Asenapine Maleate) Shortness Of Breath and Other (See Comments)    Lips tingle and tongue becomes numb/Akasthesia  . Iodinated Diagnostic Agents     Dye used for CT scans, severe hives  . Seroquel (Quetiapine Fumarate)     Patient Measurements: Height: 5' (152.4 cm) Weight: 302 lb 14.6 oz (137.4 kg) IBW/kg (Calculated) : 45.5   Vital Signs: Temp: 98.4 F (36.9 C) (10/01 1200) Temp src: Oral (10/01 1200) BP: 132/78 mmHg (10/01 1200) Pulse Rate: 104  (10/01 1200) Intake/Output from previous day: 09/30 0701 - 10/01 0700 In: 2004 [I.V.:1844; IV Piggyback:160] Out: 770 [Urine:428; Emesis/NG output:155; Drains:187]  Labs:  [facility] 04/06/12 0405 04/05/12 1508 04/05/12 0210 04/04/12 2220  WBC 19.5* -- 14.5* 8.7  HGB 10.0* -- 12.4 11.9*  PLT 484* -- 485* 466*  LABCREA -- -- -- --  CREATININE 1.89* 2.03* 1.69* --   Estimated Creatinine Clearance: 43.2 ml/min (by C-G formula based on Cr of 1.89). No results found for this basename: VANCOTROUGH:2,VANCOPEAK:2,VANCORANDOM:2,GENTTROUGH:2,GENTPEAK:2,GENTRANDOM:2,TOBRATROUGH:2,TOBRAPEAK:2,TOBRARND:2,AMIKACINPEAK:2,AMIKACINTROU:2,AMIKACIN:2, in the last 72 hours   Microbiology: Recent Results (from the past 720 hour(s))  MRSA PCR SCREENING     Status: Normal   Collection Time   04/04/12 10:13 PM      Component Value Range Status Comment   MRSA by PCR NEGATIVE  NEGATIVE Final   CULTURE, BLOOD (ROUTINE X 2)     Status: Normal (Preliminary result)   Collection Time   04/05/12  1:39 AM      Component Value Range Status Comment   Specimen Description Blood   Final    Special Requests Normal   Final    Culture  Setup Time 04/05/2012 17:41   Final    Culture     Final    Value:        BLOOD CULTURE RECEIVED NO GROWTH TO DATE CULTURE WILL BE HELD FOR 5  DAYS BEFORE ISSUING A FINAL NEGATIVE REPORT   Report Status PENDING   Incomplete   CULTURE, BLOOD (ROUTINE X 2)     Status: Normal (Preliminary result)   Collection Time   04/05/12  1:39 AM      Component Value Range Status Comment   Specimen Description Blood   Final    Special Requests Normal   Final    Culture  Setup Time 04/05/2012 17:41   Final    Culture     Final    Value:        BLOOD CULTURE RECEIVED NO GROWTH TO DATE CULTURE WILL BE HELD FOR 5 DAYS BEFORE ISSUING A FINAL NEGATIVE REPORT   Report Status PENDING   Incomplete     Medical History: Past Medical History  Diagnosis Date  . Arthritis   . Depression   . Allergy   . Hypertension   . Migraine   . UTI (lower urinary tract infection)   . Fibromyalgia   . Chronic pain syndrome   . GERD (gastroesophageal reflux disease) 01/21/2011  . Anxiety   . Panic attacks   . High blood pressure   . Knee pain   . Urination frequency   . Diabetes mellitus type II     Assessment: 82 YOF admitted for diverticulitis of large intestine with perforation, now s/p colectomy 9/29.  Patient is on day #3 of ertapenem and micafungin for abdominal coverage.  Now, will add vancomycin for suspected pneumonia.  Today's CXR shows a left base infiltrate.  WBC increasing.  Renal function is unstable, but may be trending down from SCr bump yesterday.  Estimated CrCl~37 ml/min (normalized).  Patient weight is documented at 137 kg.  Will proceed with obese dosing for vancomycin.    Goal of Therapy:  Vancomycin trough level 15-20 mcg/ml  Plan:  Vancomycin 1750 mg IV q24h. F/u SCr and trough levels. Does patient need pseudomonal coverage?  If so, would consider switching ertapenem to zosyn or primaxin.  Clance Boll 04/06/2012,1:44 PM

## 2012-04-06 NOTE — Progress Notes (Signed)
Patient is active with [facility] Care Management. Has RN [employer] and LCSW for community resources coordination.  We initially engaged the patient on 9.27.13 by phone.  Her core concern was trying to move to a new home due to the noted concerns about her home environment.  We were attempting to explore options such as ALF level care.  This probability has been very distressing to the patient.  We have not obtained an FL2, but I strongly encourage that one is created that can stay on record if she discharges to any level of care lower than SNF.  This request is an attempt to allow the [facility] Care Management community based team a quick option to admit the patient to a skilled facility in the event the she is unsuccessful with a home discharge.  For any additional questions or new referrals please contact Anibal Henderson BSN RN [facility] Liaison at [phone].

## 2012-04-06 NOTE — Progress Notes (Signed)
4098 Patient was extubated to 4 LPM nasal cannula with SPO2 98%, RR 21, HR 100, and clear, diminished breath sounds throughout lung fields, bilaterally.  Vocalization is excellent.

## 2012-04-06 NOTE — Progress Notes (Signed)
Name: Jasmine Huff MRN: [id number] DOB: 02-Jul-1956    LOS: 2  PULMONARY / CRITICAL CARE MEDICINE  Brief Patient description: 56 yo female with hypertension, diabetes and depression who underwent a R hemicolectomy on 9/29 for perforated diverticulitis.  PCCM consulted for vent and medical management.  Current Status: comfortable on vent, looks good on SBT.   Vital Signs: Temp:  [97.7 F (36.5 C)-99.5 F (37.5 C)] 98.4 F (36.9 C) (10/01 0800) Pulse Rate:  [89-118] 100  (10/01 0800) Resp:  [12-25] 12  (10/01 0800) BP: (104-128)/(56-88) 120/81 mmHg (10/01 0800) SpO2:  [95 %-98 %] 96 % (10/01 0800) FiO2 (%):  [30 %] 30 % (10/01 0800) Weight:  [137.4 kg (302 lb 14.6 oz)] 137.4 kg (302 lb 14.6 oz) (10/01 0000)  Physical Examination: Gen: intubated, breathing comfortably HEENT: NCAT, PERRL, ETT in place PULM: CTA B, occ rhonchi CV: RRR, no mgr AB: hypoactive sounds, colectomy in place, incisions well dressed Ext: warm, trace edema Neuro: awake and alert on vent, following commands, lifts head off pillow   ASSESSMENT AND PLAN  PULMONARY  Lab 04/05/12 0043 04/04/12 2233  PHART -- 7.364  PCO2ART -- 49.1*  PO2ART -- 186.0*  HCO3 -- 27.3*  O2SAT 71.1 99.2   Ventilator Settings: Vent Mode:  [-] PSV;CPAP FiO2 (%):  [30 %] 30 % Set Rate:  [14 bmp] 14 bmp Vt Set:  [420 mL] 420 mL PEEP:  [5 cmH20] 5 cmH20 Pressure Support:  [5 cmH20] 5 cmH20 Plateau Pressure:  [12 cmH20-18 cmH20] 17 cmH20  CXR: persistent bilateral airspace disease, aeration slightly worse in comparison to a film on 9/30 ETT: 9/29 >>  A:   1) Intubated on mechanical ventilation after left hemicolectomy for perforated sigmoid;  Currently on spontaneous breathing trial, PCXR w/ bilateral airspace disease. Suspect that this is both edema and element of mild ALI  P:   Push diuresis Extubate Careful w/ post extubation narcotics Will need aggressive pulm hygiene   CARDIOVASCULAR  Lab 04/05/12 1052 04/04/12  2220  TROPONINI -- --  LATICACIDVEN 2.0 2.4*  PROBNP -- --   ECG:  Will order Lines: Right IJ placed 04/04/12  A:  1) Sepsis secondary to sigmoid perforation and peritonitis, not in shock 2) Elevated lactic acid due to sepsis (result) P:  Aim for negative fluid balance Check BNP Refer to infectious disease section  RENAL  Lab 04/06/12 0405 04/05/12 1508 04/05/12 0210 04/04/12 2220 04/04/12 1206  NA 133* 134* 130* 130* 130*  K 3.8 3.9 -- -- --  CL 96 97 92* 92* 86*  CO2 29 28 29 27 30   BUN 39* 32* 25* 23 20  CREATININE 1.89* 2.03* 1.69* 1.48* 1.21*  CALCIUM 7.9* 7.6* 7.9* 7.8* 8.8  MG -- -- 1.6 1.4* --  PHOS -- -- 4.1 3.1 --   Intake/Output      09/30 0701 - 10/01 0700 10/01 0701 - 10/02 0700   I.V. 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 P:   - Management per surgical team. - Ertapenem / Micafungin  - nutrition per surg   HEMATOLOGIC  Lab 04/06/12 0405 04/05/12 0210 04/04/12 2220 04/04/12 1206  HGB 10.0* 12.4 11.9* 12.6  HCT 30.9* 38.0 36.7 38.6  PLT 484* 485* 466* 508*  INR -- 1.42 -- --  APTT -- -- -- --   A:   1) anemia Has had 2 g drop in hemoglobin in 24 hours.No evidence of bleeding.  P:  - Will follow CBC  INFECTIOUS  Lab 04/06/12 0405 04/05/12 0210 04/04/12 2220 04/04/12 1206  WBC 19.5* 14.5* 8.7 18.9*  PROCALCITON -- -- 13.08  --   Cultures: 9/29 blood >> Antibiotics: 9/29 Ertapenem >> 9/29 micafungin >>  A:   1) Sepsis secondary to sigmoid perforation and peritonitis Her WBCs cont to climb. Has very cloudy urine but should be covered for usual UTI bugs.  P:   - Antibiotics as above - UC  -recheck PCT.  - may need repeat CT abd at some point to r/o abscess   ENDOCRINE  Lab 04/06/12 0802 04/06/12 0351 04/06/12 0007 04/05/12 1912 04/05/12 1624  GLUCAP 107* 122* 130* 141* 135*   A:   1) DM  P:   - ICU hyperglycemia protocol  NEUROLOGIC  A:   1) Intubated, sedated Now awake and oriented.  P:   -change sedation to PRN for analgesia   BEST PRACTICE / DISPOSITION - Level of Care:  ICU - Primary Service:  Surgery - Consultants:  PCCM - Code Status:  Full code - Diet:  NPO - DVT Px:  SCD's - GI Px:  Protonix - Skin Integrity:  No ulcers   Attending: I have seen and examined the patient with nurse practitioner/resident and agree with the note above.   Extubated this morning, looks good. Diurese again, Cr improved slightly. Check CXR with rising WBC, f/u cultures from 9/30   CC time 30 minutes  Yolonda Kida PCCM Pager: [phone] Cell: [phone] If no response, call [phone]

## 2012-04-06 NOTE — Progress Notes (Signed)
2 Days Post-Op  Subjective: Extubated  But tired  Objective: Vital signs in last 24 hours: Temp:  [97.7 F (36.5 C)-99.5 F (37.5 C)] 98.4 F (36.9 C) (10/01 0800) Pulse Rate:  [89-114] 100  (10/01 0800) Resp:  [12-22] 12  (10/01 0800) BP: (104-128)/(56-88) 120/81 mmHg (10/01 0800) SpO2:  [95 %-97 %] 96 % (10/01 0800) FiO2 (%):  [30 %] 30 % (10/01 0800) Weight:  [302 lb 14.6 oz (137.4 kg)] 302 lb 14.6 oz (137.4 kg) (10/01 0000) Last BM Date:  (prior to admission)  Intake/Output from previous day: 09/30 0701 - 10/01 0700 In: 2004 [I.V.:1844; IV Piggyback:160] Out: 770 [Urine:428; Emesis/NG output:155; Drains:187] Intake/Output this shift: Total I/O In: 55.5 [I.V.:55.5] Out: 50 [Urine:50]  Incision/Wound:clean but dry.  Ostomy viable.  JP serous.  No pus.  Lab Results:   Basename 04/06/12 0405 04/05/12 0210  WBC 19.5* 14.5*  HGB 10.0* 12.4  HCT 30.9* 38.0  PLT 484* 485*   BMET  Basename 04/06/12 0405 04/05/12 1508  NA 133* 134*  K 3.8 3.9  CL 96 97  CO2 29 28  GLUCOSE 129* 156*  BUN 39* 32*  CREATININE 1.89* 2.03*  CALCIUM 7.9* 7.6*   PT/INR  Basename 04/05/12 0210  LABPROT 17.0*  INR 1.42   ABG  Basename 04/04/12 2233  PHART 7.364  HCO3 27.3*    Studies/Results: Ct Abdomen Pelvis Wo Contrast  04/04/2012  *RADIOLOGY REPORT*  Clinical Data: Abdominal pain periumbilical.  Hysterectomy. Cholecystectomy.  CT ABDOMEN AND PELVIS WITHOUT CONTRAST  Technique:  Multidetector CT imaging of the abdomen and pelvis was performed following the standard protocol without intravenous contrast.  Comparison: Renal ultrasound 02/23/2012.  Findings: Clear lung bases.  Minimal bronchiectasis at the lung bases which is likely post infectious or inflammatory.  Mild cardiomegaly.  Small hiatal hernia with a contrast filled mildly dilated lower thoracic esophagus.  Small volume free intraperitoneal air.  Mild hepatic steatosis. Normal spleen, stomach.  Fatty replaced pancreas.  Cholecystectomy without biliary ductal dilatation.  Normal adrenal glands and kidneys.  Small retroperitoneal nodes, without adenopathy.  There is extensive colonic diverticulosis.  There is a moderate amount of stool within the proximal sigmoid with apparent mild wall thickening of the distal sigmoid.  Example images 67 and 71 respectively.  There is inflammation surround the proximal sigmoid and distal descending colon.  No well-defined fluid collection to suggest abscess.  The remainder of the colon is normal in caliber. Normal terminal ileum and appendix.  Normal caliber of small bowel loops.  Small volume ascites within the anterior pelvis and left lower quadrant.  Small pelvic sidewall nodes bilaterally. Normal urinary bladder. Hysterectomy.  No adnexal mass.   No acute osseous abnormality.  IMPRESSION:  1.  Free intraperitoneal air, secondary to inflammation centered around the distal descending and proximal sigmoid colon.  Most likely diverticulitis. Critical test results telephoned to Dr. Denton Lank at the time of interpretation at 3:05 p.m. on 04/04/2012. 2.  Small hiatal hernia. Esophageal air fluid level suggests dysmotility or gastroesophageal reflux. 3.  Suspicion of constipation, including within the region of the inflamed sigmoid.   Original Report Authenticated By: Consuello Bossier, M.D.    Dg Chest Port 1 View  04/05/2012  *RADIOLOGY REPORT*  Clinical Data: Central line placement  PORTABLE CHEST - 1 VIEW  Comparison: Earlier the same day  Findings: 2353 hours.  Endotracheal tube tip is 2.9 cm above the base of the carina. The NG tube passes into the stomach although the  distal tip position is not included on the film.  Right IJ central line tip projects at the distal SVC level.  No evidence for right-sided pneumothorax.  The lung volumes remain low. The cardiopericardial silhouette is enlarged.  Vascular congestion persists and there appears to be slight increase in interstitial pulmonary edema.   IMPRESSION: Right IJ central line tip projects at the distal SVC level.  No evidence for pneumothorax.  Lung volumes with vascular congestion and probable progressing interstitial pulmonary edema.   Original Report Authenticated By: ERIC A. MANSELL, M.D.    Dg Chest Port 1 View  04/04/2012  *RADIOLOGY REPORT*  Clinical Data: Endotracheal tube placement  PORTABLE CHEST - 1 VIEW  Comparison: 02/23/2012  Findings: Endotracheal tube tip is 2.4 cm above the base of the carina. The NG tube passes into the stomach although the distal tip position is not included on the film.  Lung volumes are low. Vascular congestion noted. The cardiopericardial silhouette is enlarged.  IMPRESSION: Low volume film with cardiomegaly and pulmonary vascular congestion.  Endotracheal tube tip is 2.4 cm above the base of the carina.   Original Report Authenticated By: ERIC A. MANSELL, M.D.     Anti-infectives: Anti-infectives     Start     Dose/Rate Route Frequency Ordered Stop   04/05/12 1600   ertapenem (INVANZ) 1 g in sodium chloride 0.9 % 50 mL IVPB        1 g 100 mL/hr over 30 Minutes Intravenous Every 24 hours 04/04/12 2125     04/04/12 2300   micafungin (MYCAMINE) 100 mg in sodium chloride 0.9 % 100 mL IVPB        100 mg 100 mL/hr over 1 Hours Intravenous Every 24 hours 04/04/12 2230     04/04/12 1945   clindamycin (CLEOCIN) 900 mg, gentamicin (GARAMYCIN) 240 mg in sodium chloride 0.9 % 1,000 mL for intraperitoneal lavage         Intraperitoneal To Surgery 04/04/12 1931 04/04/12 2004   04/04/12 1530   ertapenem (INVANZ) 1 g in sodium chloride 0.9 % 50 mL IVPB        1 g 100 mL/hr over 30 Minutes Intravenous  Once 04/04/12 1516 04/04/12 1641          Assessment/Plan: s/p Procedure(s) (LRB) with comments: EXPLORATORY LAPAROTOMY (N/A) - left colectomy COLOSTOMY (N/A) WBC  Elevated   CXR per CCM.  CONT ABX for now.   WOCN consult FEN  Cont IVF  May need TNA depending on gut function. Pulmonary  extubated Will need PT/OT OOB DVT prevention  LOS: 2 days    Jasmine Guevara A. 04/06/2012

## 2012-04-06 NOTE — Clinical Social Work Note (Signed)
CSW received onsult for possible placement needs. CSW reviewed chart, as unable to meet with Pt currently. Pt has an extensive history of severe depression and has been seen by [facility] as an outpt. Pt appears to have limited support and unable to perform ADLs. CSW will meet with Pt in the am to assess further.    Doreen Salvage, LCSWA ICU/Stepdown Clinical Social Worker [facility] Cell [phone] Hours 8am-1200pm M-F

## 2012-04-07 ENCOUNTER — Inpatient Hospital Stay (HOSPITAL_COMMUNITY): Payer: Medicare Other

## 2012-04-07 DIAGNOSIS — K5732 Diverticulitis of large intestine without perforation or abscess without bleeding: Principal | ICD-10-CM

## 2012-04-07 DIAGNOSIS — E119 Type 2 diabetes mellitus without complications: Secondary | ICD-10-CM

## 2012-04-07 DIAGNOSIS — IMO0001 Reserved for inherently not codable concepts without codable children: Secondary | ICD-10-CM

## 2012-04-07 DIAGNOSIS — E669 Obesity, unspecified: Secondary | ICD-10-CM

## 2012-04-07 DIAGNOSIS — G8929 Other chronic pain: Secondary | ICD-10-CM

## 2012-04-07 DIAGNOSIS — E860 Dehydration: Secondary | ICD-10-CM

## 2012-04-07 LAB — GLUCOSE, CAPILLARY
Glucose-Capillary: 108 mg/dL — ABNORMAL HIGH (ref 70–99)
Glucose-Capillary: 93 mg/dL (ref 70–99)
Glucose-Capillary: 172 mg/dL — ABNORMAL HIGH (ref 70–99)

## 2012-04-07 LAB — CBC
HCT: 30.6 % — ABNORMAL LOW (ref 36.0–46.0)
Hemoglobin: 9.9 g/dL — ABNORMAL LOW (ref 12.0–15.0)
MCH: 26.7 pg (ref 26.0–34.0)
MCHC: 32.4 g/dL (ref 30.0–36.0)
MCV: 82.5 fL (ref 78.0–100.0)
RBC: 3.71 MIL/uL — ABNORMAL LOW (ref 3.87–5.11)

## 2012-04-07 LAB — COMPREHENSIVE METABOLIC PANEL
ALT: 12 U/L (ref 0–35)
Alkaline Phosphatase: 107 U/L (ref 39–117)
BUN: 36 mg/dL — ABNORMAL HIGH (ref 6–23)
CO2: 27 mEq/L (ref 19–32)
Calcium: 8.3 mg/dL — ABNORMAL LOW (ref 8.4–10.5)
GFR calc Af Amer: 58 mL/min — ABNORMAL LOW (ref >90)
GFR calc non Af Amer: 50 mL/min — ABNORMAL LOW (ref >90)
Glucose, Bld: 104 mg/dL — ABNORMAL HIGH (ref 70–99)
Sodium: 136 mEq/L (ref 135–145)
Total Protein: 5.4 g/dL — ABNORMAL LOW (ref 6.0–8.3)

## 2012-04-07 LAB — URINE CULTURE

## 2012-04-07 LAB — PRO B NATRIURETIC PEPTIDE: Pro B Natriuretic peptide (BNP): 260.8 pg/mL — ABNORMAL HIGH (ref 0–125)

## 2012-04-07 MED ORDER — INSULIN ASPART 100 UNIT/ML ~~LOC~~ SOLN: 0.0000 [IU] | Freq: Three times a day (TID) | SUBCUTANEOUS | Status: DC

## 2012-04-07 MED ORDER — HYDROXYCHLOROQUINE SULFATE 200 MG PO TABS
200.0000 mg | ORAL_TABLET | Freq: Every day | ORAL | Status: DC
  Administered 2012-04-08 – 2012-04-12 (×5): 200 mg via ORAL
  Filled 2012-04-07 (×6): qty 1

## 2012-04-07 MED ORDER — CLONAZEPAM 0.5 MG PO TABS
0.5000 mg | ORAL_TABLET | Freq: Two times a day (BID) | ORAL | Status: DC | PRN
  Administered 2012-04-08: 0.5 mg via ORAL
  Filled 2012-04-07 (×2): qty 1

## 2012-04-07 MED ORDER — ESCITALOPRAM OXALATE 20 MG PO TABS
20.0000 mg | ORAL_TABLET | Freq: Every day | ORAL | Status: DC
  Administered 2012-04-07 – 2012-04-08 (×2): 20 mg via ORAL
  Filled 2012-04-07 (×2): qty 1

## 2012-04-07 NOTE — Plan of Care (Signed)
Problem: Phase II Progression Outcomes Goal: Date pt extubated/weaned off vent Outcome: Completed/Met Date Met:  04/07/12 04/06/12 Goal: Tolerating prescribed nutrition plan Outcome: Not Met (add Reason) Pt remains NPO

## 2012-04-07 NOTE — Progress Notes (Signed)
Nutrition Follow-up  Intervention: Diet advancement per MD. Will monitor.   Diet Order:  NPO  - Pt extubated yesterday. NGT d/c today. Noted possible plans to start clear liquid diet. POD# 2 exploratory laparotomy and colostomy.   Meds: Scheduled Meds:   . antiseptic oral rinse  15 mL Mouth Rinse QID  . chlorhexidine  15 mL Mouth Rinse BID  . docusate sodium  100 mg Oral BID  . ertapenem (INVANZ) IV  1 g Intravenous Q24H  . furosemide  40 mg Intravenous Q12H  . heparin  5,000 Units Subcutaneous Q8H  . influenza  inactive virus vaccine  0.5 mL Intramuscular Tomorrow-1000  . insulin aspart  2-6 Units Subcutaneous Q4H  . micafungin (MYCAMINE) IV  100 mg Intravenous Q24H  . pantoprazole (PROTONIX) IV  40 mg Intravenous QHS  . pneumococcal 23 valent vaccine  0.5 mL Intramuscular Tomorrow-1000  . potassium chloride  10 mEq Intravenous Q1 Hr x 4  . vancomycin  1,750 mg Intravenous Q24H   Continuous Infusions:   . sodium chloride 20 mL/hr (04/05/12 1100)   PRN Meds:.sodium chloride, fentaNYL, ketoconazole, ondansetron, promethazine  Labs:  CMP     Component Value Date/Time   NA 136 04/07/2012 0350   K 3.9 04/07/2012 0350   CL 97 04/07/2012 0350   CO2 27 04/07/2012 0350   GLUCOSE 104* 04/07/2012 0350   BUN 36* 04/07/2012 0350   CREATININE 1.19* 04/07/2012 0350   CREATININE 0.79 10/21/2011 0925   CALCIUM 8.3* 04/07/2012 0350   PROT 5.4* 04/07/2012 0350   ALBUMIN 1.6* 04/07/2012 0350   AST 19 04/07/2012 0350   ALT 12 04/07/2012 0350   ALKPHOS 107 04/07/2012 0350   BILITOT 0.3 04/07/2012 0350   GFRNONAA 50* 04/07/2012 0350   GFRAA 58* 04/07/2012 0350   CBG (last 3)   Basename 04/07/12 1212 04/07/12 0801 04/07/12 0339  GLUCAP 93 108* 100*      Intake/Output Summary (Last 24 hours) at 04/07/12 1411 Last data filed at 04/07/12 1200  Gross per 24 hour  Intake    924 ml  Output   2230 ml  Net  -1306 ml   Last BM - PTA  Weight Status:  9/30 302 lb 7.5 oz 10/2 298 lb 8.1  oz  Re-estimated needs:   1850-1950 calories 95-105g protein  Nutrition Dx: Inadequate oral intake - ongoing  Goal: Enteral nutrition to provide 60-70% of estimated calorie needs (22-25 kcals/kg ideal body weight) and >/= 90% of estimated protein needs, based on ASPEN guidelines for permissive underfeeding in critically ill obese individuals - not met, TF not started  New goal: Diet advancement per MD to low fiber diet.    Monitor: Weights, labs, intake, education needs once diet advanced, colostomy output   Levon Hedger MS, RD, LDN [phone] Pager [phone] After Hours Pager

## 2012-04-07 NOTE — Clinical Social Work Psychosocial (Signed)
Clinical Social Work Department BRIEF PSYCHOSOCIAL ASSESSMENT 04/07/2012  Patient:  Jasmine Huff, Jasmine Huff     Account Number:  [id number]     Admit date:  04/04/2012  Clinical Social Worker:  Jodelle Red  Date/Time:  04/07/2012 11:27 AM  Referred by:  Physician  Date Referred:  04/05/2012 Referred for  Other - See comment   Other Referral:   POSSIBLE HOMELESS ISSUES   Interview type:  Patient Other interview type:   TELEPHONE CALL TO BROTHER, Jasmine Huff    PSYCHOSOCIAL DATA Living Status:  FAMILY Admitted from facility:   Level of care:   Primary support name:  Jasmine Huff Primary support relationship to patient:  SIBLING Degree of support available:   ADEQUATE, BROTHER HAS BEEN CAREGIVER FOR YEARS    CURRENT CONCERNS Current Concerns  Behavioral Health Issues  Post-Acute Placement  Adjustment to Illness   Other Concerns:    SOCIAL WORK ASSESSMENT / PLAN CSW spoke with Pt in her ICU room about concerns that her brother will not let her return home. Pt appeared oriented x3 for part of our discussion, but then became more confused and upset. Pt was living with her brother several years and gave conflicting reports about how much she could do to take care of herself at home. Pt shared she gets disability, but has financial concerns. She shared she has not had to pay rent at her brother's home. CSW inquired if Pt had ever considered getting her own apartment, but she said she had no money. Pt shared she does have good support from friends and her church. Pt then became upset and tearful and asked CSW to come back in the am. Pt consented to CSW to call brother for more information.    CSW spoke to brother at length. He shared with Jasmine Huff prior to surgery that her care needs exceeded his abilities. He was not kicking her out, but that she needs more help than he can provide. Brother shared with CSW that Pt has not been able to do many of her ADLs and he has to assist with  many of these. He stated that it was more physical than mental in nature, but her battle with depression has been long. Per brother, Pt has had difficulty doing PT in the past due to pain issues.   Assessment/plan status:  Psychosocial Support/Ongoing Assessment of Needs Other assessment/ plan:   possible SNF placement  possible psych consult-Per chart, Pt seen regularly as an outpt. at [facility].   Information/referral to community resources:    PATIENT'S/FAMILY'S RESPONSE TO PLAN OF CARE: Pt tearful and appears overwhelmed currently. She appears to have good community support, but will need ST rehab at d/c. CSW will follow for support and dispo. needs.        Jasmine Huff, LCSWA ICU/Stepdown Clinical Social Worker [facility] Cell [phone] Hours 8am-1200pm M-F

## 2012-04-07 NOTE — Progress Notes (Signed)
Patient became agitated as I offered support. Said, "Help me. Please, help me." I asked about how I could support her. She asked for prayer. She talked about being concerned about her parents and brother but then admitted that her father was dead. She seemed a little confused. Prayed per her request. Will continue to follow for support.  04/07/12 1000  Clinical Encounter Type  Visited With Patient  Visit Type Spiritual support;Social support  Recommendations Follow up  Spiritual Encounters  Spiritual Needs Prayer;Emotional  Stress Factors  Patient Stress Factors Family relationships;Health changes

## 2012-04-07 NOTE — Progress Notes (Signed)
Nsg note: Encouraged cough and deep breath. Educated with teach back on benefits of c/db. Pt taking shallow breaths. Instructed pt with teach back on how to use and benefits of IS. When asked how she used the IS, she could not repeat anything I had taught her. She reached 500 twice on the IS and mostly 250. Reattempted an hour later with the same results.  Will continue to encourage pt and reinforce teaching for c/db and IS.

## 2012-04-07 NOTE — Progress Notes (Signed)
Name: Jasmine Huff MRN: [id number] DOB: Sep 24, 1955    LOS: 3  PULMONARY / CRITICAL CARE MEDICINE  Brief Patient description: 56 yo female with hypertension, diabetes and depression who underwent a R hemicolectomy on 9/29 for perforated diverticulitis.  PCCM consulted for vent and medical management.  Current Status: comfortable on vent, looks good on SBT.   Vital Signs: Temp:  [97.5 F (36.4 C)-98.4 F (36.9 C)] 98 F (36.7 C) (10/02 0800) Pulse Rate:  [98-110] 100  (10/02 0800) Resp:  [12-21] 17  (10/02 0800) BP: (104-144)/(60-86) 136/73 mmHg (10/02 0800) SpO2:  [91 %-100 %] 94 % (10/02 0800) Weight:  [135.4 kg (298 lb 8.1 oz)] 135.4 kg (298 lb 8.1 oz) (10/02 0200) Room air  Physical Examination: Gen: intubated, breathing comfortably HEENT: NCAT, PERRL, NGT in place  PULM: CTA B CV: RRR, no mgr AB: hypoactive sounds, colectomy in place, incisions well dressed Ext: warm, no sig edema Neuro: awake no focal def    ASSESSMENT AND PLAN  PULMONARY  Lab 04/05/12 0043 04/04/12 2233  PHART -- 7.364  PCO2ART -- 49.1*  PO2ART -- 186.0*  HCO3 -- 27.3*  O2SAT 71.1 99.2   Ventilator Settings:    CXR: bilateral airspace disease/edema. No sig change. Basilar vol loss. Possible L effusion (very small) ETT: 9/29 >>  A:   1) Intubated on mechanical ventilation after left hemicolectomy for perforated sigmoid;  Currently on spontaneous breathing trial, PCXR w/ bilateral airspace disease. Suspect that this is both edema and element of mild ALI. Extubated on 10/1 Now on room air.  P:   Careful w/ post extubation narcotics Will need aggressive pulm hygiene -->have ordered PT  CARDIOVASCULAR  Lab 04/07/12 0350 04/05/12 1052 04/04/12 2220  TROPONINI -- -- --  LATICACIDVEN -- 2.0 2.4*  PROBNP 260.8* -- --   ECG:  Will order Lines: Right IJ placed 04/04/12  A:  1) Sepsis secondary to sigmoid perforation and peritonitis, not in shock 2) Elevated lactic acid due to sepsis  (resolved) P:  Keep even Refer to infectious disease section  RENAL  Lab 04/07/12 0350 04/06/12 0405 04/05/12 1508 04/05/12 0210 04/04/12 2220  NA 136 133* 134* 130* 130*  K 3.9 3.8 -- -- --  CL 97 96 97 92* 92*  CO2 27 29 28 29 27   BUN 36* 39* 32* 25* 23  CREATININE 1.19* 1.89* 2.03* 1.69* 1.48*  CALCIUM 8.3* 7.9* 7.6* 7.9* 7.8*  MG -- -- -- 1.6 1.4*  PHOS -- -- -- 4.1 3.1   Intake/Output      10/01 0701 - 10/02 0700 10/02 0701 - 10/03 0700   I.V. (mL/kg) 965.5 (7.1)    IV Piggyback 114    Total Intake(mL/kg) 1079.5 (8)    Urine (mL/kg/hr) 1735 (0.5)    Emesis/NG output 250    Drains 85    Total Output 2070    Net -990.5          Foley:  04/04/12  A:   1) Acute renal failure secondary to sepsis; Has had favorable response to diuretics, renal fxn now almost normal  P:   -continue KVO IV fluids -Close observation of blood chemistry  GASTROINTESTINAL  Lab 04/07/12 0350 04/04/12 1206  AST 19 14  ALT 12 26  ALKPHOS 107 216*  BILITOT 0.3 0.6  PROT 5.4* 7.2  ALBUMIN 1.6* 2.7*    A:   1) Sigmoid perforation post left hemicolectomy and end ileostomy. P:   - Management per surgical team. - Ertapenem /  Micafungin  - nutrition per surg-->planning on trial of cl liq   HEMATOLOGIC  Lab 04/07/12 0350 04/06/12 1415 04/06/12 0405 04/05/12 0210 04/04/12 2220  HGB 9.9* 9.9* 10.0* 12.4 11.9*  HCT 30.6* 31.1* 30.9* 38.0 36.7  PLT 503* 492* 484* 485* 466*  INR -- -- -- 1.42 --  APTT -- -- -- -- --   A:   1) anemia Stabilized.  P:  - Will follow CBC  INFECTIOUS  Lab 04/07/12 0350 04/06/12 1415 04/06/12 0405 04/05/12 0210 04/04/12 2220  WBC 19.6* 20.6* 19.5* 14.5* 8.7  PROCALCITON -- -- -- -- 13.08   Cultures: 9/29 blood >> 10/1 UC>> Antibiotics: 9/29 Ertapenem >> 9/29 micafungin >>  A:   1) Sepsis secondary to sigmoid perforation and peritonitis Hemodynamically stable, but WBC remains elevated  P:   - Antibiotics as above - UC  - may need repeat CT  abd at some point to r/o abscess   ENDOCRINE  Lab 04/07/12 0801 04/07/12 0339 04/06/12 2355 04/06/12 1941 04/06/12 1647  GLUCAP 108* 100* 96 88 100*   A:   1)hyperglycemia Excellent control  P:   - ICU hyperglycemia protocol  NEUROLOGIC  A:   1) Post Op pain Fibromyalgia P:   -change sedation to PRN for analgesia   BEST PRACTICE / DISPOSITION - Level of Care:  ICU--> med/surg - Primary Service:  Surgery - Consultants:  PCCM-->sign off effective 10/2 - Code Status:  Full code - Diet:  NPO - DVT Px:  SCD's--> heparin  - GI Px:  Protonix - Skin Integrity:  No ulcers   Attending:  I have seen and examined the patient with nurse practitioner/resident and agree with the note above.   To floor today.  PCCM to sign off.  Yolonda Kida PCCM Pager: [phone] Cell: [phone] If no response, call [phone]

## 2012-04-07 NOTE — Consult Note (Signed)
[facility] Medical Consultation  Jasmine Huff ZOX:[id number] DOB: 1955-12-09 DOA: 04/04/2012 PCP: [name], MD   Requesting physician: WILL from Surgery Service. Date of consultation: 04/07/12 Reason for consultation: management of medical issues.   Impression/Recommendations Active Problems:  ARF (acute renal failure)  Respiratory failure, post-operative  Severe sepsis  Diverticulitis of large intestine with perforation    1. Fibromyalgia: plan to resume her home medications whenever surgery starts on po. Will hold MS contin and keep her on IV pain medications.  2. Leukocytosis: probably secondary to severe sepsis from diverticulitis and colon perforation. She is currently on IV VANCOMYCIN and IV micafungin, IV ertapenam.  3. Hypertension: controlled.  4. Dm: CBG (last 3)   Basename 04/07/12 1212 04/07/12 0801 04/07/12 0339  GLUCAP 93 108* 100*     5. Depression with anxiety; continue with lexapro.  6. Pulmonary Edema: resume lasix.   I will followup again tomorrow. Please contact me if I can be of assistance in the meanwhile. Thank you for this consultation.  Chief Complaint: abdominal pain .   HPI:  56 year old lady with prior h/o depression, fibromyalgia, Hypertension, DM, Anxiety , Panic attacks, chronic pain syndrome, bedridden secondary to fibromyalgia was brought to the hospital for severe abdominal pain, was found to have perforated colon underwent left hemicolectomy with splenic flexure mobilization and end colostomy on 9/29, intubated for acute respiratory failure post op  & extubated today  and  Has been under the care of PCCM for vent management until 10/2, now being transferred to floor for further management. Meanwhile Hospitalist service consulted for management of medical issues. She reports pain from fibromyalgia persists and has been following up with Dr [name] as outpatient. She reports she is on plaquenil for fibromyalgia with minimal relief.  She is also on MS Contin for chronic pain syndrome .    Review of Systems:  See HPI otherwise negative  Past Medical History  Diagnosis Date  . Arthritis   . Depression   . Allergy   . Hypertension   . Migraine   . UTI (lower urinary tract infection)   . Fibromyalgia   . Chronic pain syndrome   . GERD (gastroesophageal reflux disease) 01/21/2011  . Anxiety   . Panic attacks   . High blood pressure   . Knee pain   . Urination frequency   . Diabetes mellitus type II    Past Surgical History  Procedure Date  . Cholecystectomy   . Abdominal hysterectomy   . Right ovary removal   . Left hemicolectomy with splenic flexure mobilization and end colostomy 04/04/2012  . Laparotomy 04/04/2012    Procedure: EXPLORATORY LAPAROTOMY;  Surgeon: Romie Levee, MD;  Location: [facility];  Service: General;  Laterality: N/A;  left colectomy  . Colostomy 04/04/2012    Procedure: COLOSTOMY;  Surgeon: Romie Levee, MD;  Location: [facility];  Service: General;  Laterality: N/A;   Social History:  reports that she has never smoked. She has never used smokeless tobacco. She reports that she does not drink alcohol or use illicit drugs.  Allergies  Allergen Reactions  . Pregabalin Rash    Alters Mental status, flushing  . Saphris (Asenapine Maleate) Shortness Of Breath and Other (See Comments)    Lips tingle and tongue becomes numb/Akasthesia  . Iodinated Diagnostic Agents     Dye used for CT scans, severe hives  . Seroquel (Quetiapine Fumarate)    Family History  Problem Relation Age of Onset  . Depression Mother   .  Depression Father   . Alcohol abuse Brother   . Depression Brother   . Depression Maternal Grandfather   . Depression Other     Prior to Admission medications   Medication Sig Start Date End Date Taking? Authorizing Provider  aspirin EC 81 MG tablet Take 81 mg by mouth daily.   Yes Historical Provider, MD  cetirizine (ZYRTEC) 10 MG tablet Take 10 mg by mouth at bedtime.    Yes  Historical Provider, MD  clonazePAM (KLONOPIN) 0.5 MG tablet Take 0.5 mg by mouth 3 (three) times daily as needed. For anxiety/rest   Yes Historical Provider, MD  escitalopram (LEXAPRO) 20 MG tablet Take 1.5 tablets (30 mg total) by mouth daily. 01/23/12 04/22/12 Yes Cleotis Nipper, MD  Ferrous Gluconate (IRON) 240 (27 FE) MG TABS Take 1 tablet by mouth daily.    Yes Historical Provider, MD  hydrochlorothiazide (HYDRODIURIL) 25 MG tablet Take 25 mg by mouth daily.  03/01/12  Yes Historical Provider, MD  hydroxychloroquine (PLAQUENIL) 200 MG tablet Take 200 mg by mouth 2 (two) times daily.  08/04/11  Yes Historical Provider, MD  morphine (MS CONTIN) 60 MG 12 hr tablet Take 60 mg by mouth 2 (two) times daily as needed. For pain. 01/22/12  Yes Historical Provider, MD  omeprazole (PRILOSEC) 40 MG capsule Take 40 mg by mouth daily.   Yes Historical Provider, MD  ondansetron (ZOFRAN) 4 MG tablet Take 4 mg by mouth every 8 (eight) hours as needed. For nausea. 03/15/12  Yes Historical Provider, MD  oxyCODONE-acetaminophen (PERCOCET) 10-325 MG per tablet Take 1 tablet by mouth every 4 (four) hours as needed. For pain   Yes Historical Provider, MD  traZODone (DESYREL) 100 MG tablet Take 1 tablet (100 mg total) by mouth at bedtime. 01/23/12  Yes Cleotis Nipper, MD   Physical Exam: Blood pressure 134/78, pulse 106, temperature 99.4 F (37.4 C), temperature source Oral, resp. rate 22, height 5' (1.524 m), weight 135.4 kg (298 lb 8.1 oz), SpO2 99.00%. Filed Vitals:   04/07/12 0800 04/07/12 1000 04/07/12 1300 04/07/12 1354  BP: 136/73   134/78  Pulse: 100 89 105 106  Temp: 98 F (36.7 C)  98 F (36.7 C) 99.4 F (37.4 C)  TempSrc: Oral  Oral Oral  Resp: 17 14  22   Height:      Weight:      SpO2: 94% 93% 99% 99%    Constitutional: Vital signs reviewed.  Patient is poorly nourished in no acute distress and cooperative with exam. Alert and oriented x3.  Head: Normocephalic and atraumatic Mouth: no erythema or  exudates, MMM Eyes: PERRL, EOMI, conjunctivae normal, No scleral icterus.  Neck: Supple, Trachea midline normal ROM, No JVD, mass, thyromegaly, or carotid bruit present.  Cardiovascular: RRR, S1 normal, S2 normal, no MRG, pulses symmetric and intact bilaterally Pulmonary/Chest: CTAB, no wheezes, rales, or rhonchi Abdominal: Soft. Generalized tenderness, colectomy in place .  Musculoskeletal: No joint deformities, erythema, or stiffness, ROM full and no nontender Neurological: A&O x3, able to move all extremities. No sensory deficits.   Psychiatric: appears depressed.   Labs on Admission:  Basic Metabolic Panel:  Lab 04/07/12 1610 04/06/12 0405 04/05/12 1508 04/05/12 0210 04/04/12 2220  NA 136 133* 134* 130* 130*  K 3.9 3.8 3.9 3.7 3.4*  CL 97 96 97 92* 92*  CO2 27 29 28 29 27   GLUCOSE 104* 129* 156* 212* 193*  BUN 36* 39* 32* 25* 23  CREATININE 1.19* 1.89* 2.03* 1.69*  1.48*  CALCIUM 8.3* 7.9* 7.6* 7.9* 7.8*  MG -- -- -- 1.6 1.4*  PHOS -- -- -- 4.1 3.1   Liver Function Tests:  Lab 04/07/12 0350 04/04/12 1206  AST 19 14  ALT 12 26  ALKPHOS 107 216*  BILITOT 0.3 0.6  PROT 5.4* 7.2  ALBUMIN 1.6* 2.7*   No results found for this basename: LIPASE:5,AMYLASE:5 in the last 168 hours No results found for this basename: AMMONIA:5 in the last 168 hours CBC:  Lab 04/07/12 0350 04/06/12 1415 04/06/12 0405 04/05/12 0210 04/04/12 2220 04/04/12 1206  WBC 19.6* 20.6* 19.5* 14.5* 8.7 --  NEUTROABS -- 18.0* 17.1* -- -- 17.1*  HGB 9.9* 9.9* 10.0* 12.4 11.9* --  HCT 30.6* 31.1* 30.9* 38.0 36.7 --  MCV 82.5 82.7 82.4 83.2 83.0 --  PLT 503* 492* 484* 485* 466* --   Cardiac Enzymes: No results found for this basename: CKTOTAL:5,CKMB:5,CKMBINDEX:5,TROPONINI:5 in the last 168 hours BNP: No components found with this basename: POCBNP:5 CBG:  Lab 04/07/12 1212 04/07/12 0801 04/07/12 0339 04/06/12 2355 04/06/12 1941  GLUCAP 93 108* 100* 96 88    Radiological Exams on Admission: Dg Chest  Port 1 View  04/07/2012  *RADIOLOGY REPORT*  Clinical Data: Evaluate endotracheal tube.  PORTABLE CHEST - 1 VIEW  Comparison: 04/06/2012  Findings: No endotracheal tube identified.  An NG tube descends into the abdomen, tip not visualized.  A right IJ catheter tip projects over the cavoatrial junction.  Prominent cardiomediastinal contours with central vascular congestion.  Mild interstitial and perihilar airspace opacity. Mild retrocardiac opacity. Small effusions not excluded.  No pneumothorax.  No acute osseous finding.  IMPRESSION: Mild pulmonary edema pattern.  Mild retrocardiac opacity; atelectasis versus infiltrate.   Original Report Authenticated By: Waneta Martins, M.D.    Dg Chest Port 1 View  04/06/2012  *RADIOLOGY REPORT*  Clinical Data: Fever  PORTABLE CHEST - 1 VIEW  Comparison: April 04, 2012  Findings: There is focal infiltrate in the left base.  The lungs are otherwise clear.  Heart is upper normal in size with normal pulmonary vascularity.  No adenopathy.  Half there is arthropathy in both shoulders.  Central catheter tip is near the junction of the superior vena cava and right atrium.  Nasogastric tube extends into the stomach.  No pneumothorax.  IMPRESSION: Left base infiltrate.  No pneumothorax.  No change in cardiac silhouette.  Endotracheal tube no longer present.   Original Report Authenticated By: Arvin Collard. WOODRUFF III, M.D.         [facility] Pager [phone]  If 7PM-7AM, please contact night-coverage [email or web address] Password [email or web address] 04/07/2012, 3:03 PM

## 2012-04-07 NOTE — Progress Notes (Signed)
3 Days Post-Op  Subjective: TIRED.  Objective: Vital signs in last 24 hours: Temp:  [97.5 F (36.4 C)-98.4 F (36.9 C)] 98 F (36.7 C) (10/02 0800) Pulse Rate:  [98-110] 100  (10/02 0800) Resp:  [12-21] 17  (10/02 0800) BP: (104-144)/(60-86) 136/73 mmHg (10/02 0800) SpO2:  [91 %-100 %] 94 % (10/02 0800) Weight:  [298 lb 8.1 oz (135.4 kg)] 298 lb 8.1 oz (135.4 kg) (10/02 0200) Last BM Date:  (prior to admission)  Intake/Output from previous day: 10/01 0701 - 10/02 0700 In: 1079.5 [I.V.:965.5; IV Piggyback:114] Out: 2070 [Urine:1735; Emesis/NG output:250; Drains:85] Intake/Output this shift:    General appearance: fatigued, no distress and morbidly obese Cardio: regular rate and rhythm, S1, S2 normal, no murmur, click, rub or gallop Incision/Wound:CLEAN.  OSTOMY FUNCTIONING WOUND OPEN AND CLEAN  Lab Results:   Basename 04/07/12 0350 04/06/12 1415  WBC 19.6* 20.6*  HGB 9.9* 9.9*  HCT 30.6* 31.1*  PLT 503* 492*   BMET  Basename 04/07/12 0350 04/06/12 0405  NA 136 133*  K 3.9 3.8  CL 97 96  CO2 27 29  GLUCOSE 104* 129*  BUN 36* 39*  CREATININE 1.19* 1.89*  CALCIUM 8.3* 7.9*   PT/INR  Basename 04/05/12 0210  LABPROT 17.0*  INR 1.42   ABG  Basename 04/04/12 2233  PHART 7.364  HCO3 27.3*    Studies/Results: Dg Chest Port 1 View  04/07/2012  *RADIOLOGY REPORT*  Clinical Data: Evaluate endotracheal tube.  PORTABLE CHEST - 1 VIEW  Comparison: 04/06/2012  Findings: No endotracheal tube identified.  An NG tube descends into the abdomen, tip not visualized.  A right IJ catheter tip projects over the cavoatrial junction.  Prominent cardiomediastinal contours with central vascular congestion.  Mild interstitial and perihilar airspace opacity. Mild retrocardiac opacity. Small effusions not excluded.  No pneumothorax.  No acute osseous finding.  IMPRESSION: Mild pulmonary edema pattern.  Mild retrocardiac opacity; atelectasis versus infiltrate.   Original Report  Authenticated By: Waneta Martins, M.D.    Dg Chest Port 1 View  04/06/2012  *RADIOLOGY REPORT*  Clinical Data: Fever  PORTABLE CHEST - 1 VIEW  Comparison: April 04, 2012  Findings: There is focal infiltrate in the left base.  The lungs are otherwise clear.  Heart is upper normal in size with normal pulmonary vascularity.  No adenopathy.  Half there is arthropathy in both shoulders.  Central catheter tip is near the junction of the superior vena cava and right atrium.  Nasogastric tube extends into the stomach.  No pneumothorax.  IMPRESSION: Left base infiltrate.  No pneumothorax.  No change in cardiac silhouette.  Endotracheal tube no longer present.   Original Report Authenticated By: Arvin Collard. WOODRUFF III, M.D.     Anti-infectives: Anti-infectives     Start     Dose/Rate Route Frequency Ordered Stop   04/06/12 1600   vancomycin (VANCOCIN) 1,750 mg in sodium chloride 0.9 % 500 mL IVPB        1,750 mg 250 mL/hr over 120 Minutes Intravenous Every 24 hours 04/06/12 1350     04/05/12 1600   ertapenem (INVANZ) 1 g in sodium chloride 0.9 % 50 mL IVPB        1 g 100 mL/hr over 30 Minutes Intravenous Every 24 hours 04/04/12 2125     04/04/12 2300   micafungin (MYCAMINE) 100 mg in sodium chloride 0.9 % 100 mL IVPB        100 mg 100 mL/hr over 1 Hours Intravenous Every  24 hours 04/04/12 2230     04/04/12 1945   clindamycin (CLEOCIN) 900 mg, gentamicin (GARAMYCIN) 240 mg in sodium chloride 0.9 % 1,000 mL for intraperitoneal lavage         Intraperitoneal To Surgery 04/04/12 1931 04/04/12 2004   04/04/12 1530   ertapenem (INVANZ) 1 g in sodium chloride 0.9 % 50 mL IVPB        1 g 100 mL/hr over 30 Minutes Intravenous  Once 04/04/12 1516 04/04/12 1641          Assessment/Plan: s/p Procedure(s) (LRB) with comments: EXPLORATORY LAPAROTOMY (N/A) - left colectomy COLOSTOMY (N/A) Patient Active Problem List  Diagnosis  . Cellulitis  . Hypertension  . Fibromyalgia  . Chronic pain    . Urinary frequency  . Other and unspecified hyperlipidemia  . Obesity  . Insomnia  . Depression  . Hyperglycemia  . GERD (gastroesophageal reflux disease)  . Major depressive disorder, recurrent episode, severe, without mention of psychotic behavior  . Dehydration  . ARF (acute renal failure)  . Oral thrush  . Hypoxia  . Diabetes mellitus  . OSA (obstructive sleep apnea)  . Respiratory failure, post-operative  . Severe sepsis  . Diverticulitis of large intestine with perforation  Looks tired but stable To floor.   Wound vac if possible. Will need extensive PT/OT D/G NGT TRY SOME CLEARS  LOS: 3 days    Zaela Graley A. 04/07/2012

## 2012-04-08 ENCOUNTER — Encounter (HOSPITAL_COMMUNITY): Payer: Self-pay | Admitting: General Surgery

## 2012-04-08 DIAGNOSIS — K631 Perforation of intestine (nontraumatic): Secondary | ICD-10-CM | POA: Diagnosis present

## 2012-04-08 LAB — GLUCOSE, CAPILLARY
Glucose-Capillary: 142 mg/dL — ABNORMAL HIGH (ref 70–99)
Glucose-Capillary: 145 mg/dL — ABNORMAL HIGH (ref 70–99)

## 2012-04-08 MED ORDER — ESCITALOPRAM OXALATE 20 MG PO TABS
30.0000 mg | ORAL_TABLET | Freq: Every day | ORAL | Status: DC
  Administered 2012-04-09 – 2012-04-16 (×8): 30 mg via ORAL
  Filled 2012-04-08 (×8): qty 1

## 2012-04-08 MED ORDER — SODIUM CHLORIDE 0.9 % IJ SOLN
10.0000 mL | INTRAMUSCULAR | Status: DC | PRN
  Administered 2012-04-10 – 2012-04-12 (×7): 10 mL
  Administered 2012-04-13: 20 mL
  Administered 2012-04-15 (×2): 10 mL

## 2012-04-08 MED ORDER — INSULIN ASPART 100 UNIT/ML ~~LOC~~ SOLN
0.0000 [IU] | Freq: Every day | SUBCUTANEOUS | Status: DC
  Administered 2012-04-14: 2 [IU] via SUBCUTANEOUS

## 2012-04-08 MED ORDER — PNEUMOCOCCAL VAC POLYVALENT 25 MCG/0.5ML IJ INJ
0.5000 mL | INJECTION | INTRAMUSCULAR | Status: AC
  Administered 2012-04-09: 0.5 mL via INTRAMUSCULAR
  Filled 2012-04-08: qty 0.5

## 2012-04-08 MED ORDER — INSULIN ASPART 100 UNIT/ML ~~LOC~~ SOLN
0.0000 [IU] | Freq: Three times a day (TID) | SUBCUTANEOUS | Status: DC
  Administered 2012-04-08: 2 [IU] via SUBCUTANEOUS
  Administered 2012-04-09 (×2): 1 [IU] via SUBCUTANEOUS
  Administered 2012-04-09: 2 [IU] via SUBCUTANEOUS
  Administered 2012-04-10 – 2012-04-11 (×3): 1 [IU] via SUBCUTANEOUS
  Administered 2012-04-11: 2 [IU] via SUBCUTANEOUS
  Administered 2012-04-11 – 2012-04-13 (×4): 1 [IU] via SUBCUTANEOUS
  Administered 2012-04-13: 2 [IU] via SUBCUTANEOUS
  Administered 2012-04-13 – 2012-04-15 (×4): 1 [IU] via SUBCUTANEOUS
  Administered 2012-04-15: 2 [IU] via SUBCUTANEOUS
  Administered 2012-04-16 (×2): 1 [IU] via SUBCUTANEOUS
  Administered 2012-04-16: 2 [IU] via SUBCUTANEOUS

## 2012-04-08 MED ORDER — MENTHOL 3 MG MT LOZG
1.0000 | LOZENGE | OROMUCOSAL | Status: DC | PRN
Start: 2012-04-08 — End: 2012-04-16
  Filled 2012-04-08: qty 9

## 2012-04-08 NOTE — Progress Notes (Signed)
Zola Button, PA aware via phone pt c/o of cough. Encouraged to deep breath and cough with the benefits explained. IS =[phone]. Cough shallow, nagging, and causing pain to incisional area therefore pt wants relief. See new order for lozenges received from PA. Pt sitting in chair yet wanted to get back to bed. PT and RN encouraged to sit up a little longer. Explained the benefit of being up in chair vs bed with verbalized understanding. Reassurance given verbally and through touch.

## 2012-04-08 NOTE — Progress Notes (Signed)
4 Days Post-Op  Subjective: Weepy in bed, frightened, but no distress.  Objective: Vital signs in last 24 hours: Temp:  [98 F (36.7 C)-99.4 F (37.4 C)] 98.2 F (36.8 C) (10/03 0606) Pulse Rate:  [88-106] 88  (10/03 0606) Resp:  [14-22] 18  (10/03 0606) BP: (134-154)/(71-83) 154/71 mmHg (10/03 0606) SpO2:  [93 %-99 %] 96 % (10/03 0606) Last BM Date:  (prior to admission)  200 ml from colostomy, 240 PO recorded, 50 ml NG, 55 thru drain. Diet: clears, tm 99.4,VSS, HR better, no labs today. She has allot of stool coming from her ostomy today.  Intake/Output from previous day: 10/02 0701 - 10/03 0700 In: 626.7 [P.O.:240; I.V.:386.7] Out: 2105 [Urine:1800; Emesis/NG output:50; Drains:55; Stool:200] Intake/Output this shift:    General appearance: alert, cooperative and no distress.  She is tearful and says she does not remember anything.  Can't tell me if they were getting her up in chair in ICU. Resp: clear to auscultation bilaterally and Anterior GI: Large open abdominal wound, Poorly packed, somewhat soupy at the base..  The open wound is about 15 cm deep at the base of the incision.  Her perineium is red and wet, with a large panus covering area.  Foley in place.  Lab Results:   Basename 04/07/12 0350 04/06/12 1415  WBC 19.6* 20.6*  HGB 9.9* 9.9*  HCT 30.6* 31.1*  PLT 503* 492*    BMET  Basename 04/07/12 0350 04/06/12 0405  NA 136 133*  K 3.9 3.8  CL 97 96  CO2 27 29  GLUCOSE 104* 129*  BUN 36* 39*  CREATININE 1.19* 1.89*  CALCIUM 8.3* 7.9*   PT/INR No results found for this basename: LABPROT:2,INR:2 in the last 72 hours   Lab 04/07/12 0350 04/04/12 1206  AST 19 14  ALT 12 26  ALKPHOS 107 216*  BILITOT 0.3 0.6  PROT 5.4* 7.2  ALBUMIN 1.6* 2.7*     Lipase  No results found for this basename: lipase     Studies/Results: Dg Chest Port 1 View  04/07/2012  *RADIOLOGY REPORT*  Clinical Data: Evaluate endotracheal tube.  PORTABLE CHEST - 1 VIEW   Comparison: 04/06/2012  Findings: No endotracheal tube identified.  An NG tube descends into the abdomen, tip not visualized.  A right IJ catheter tip projects over the cavoatrial junction.  Prominent cardiomediastinal contours with central vascular congestion.  Mild interstitial and perihilar airspace opacity. Mild retrocardiac opacity. Small effusions not excluded.  No pneumothorax.  No acute osseous finding.  IMPRESSION: Mild pulmonary edema pattern.  Mild retrocardiac opacity; atelectasis versus infiltrate.   Original Report Authenticated By: Waneta Martins, M.D.    Dg Chest Port 1 View  04/06/2012  *RADIOLOGY REPORT*  Clinical Data: Fever  PORTABLE CHEST - 1 VIEW  Comparison: April 04, 2012  Findings: There is focal infiltrate in the left base.  The lungs are otherwise clear.  Heart is upper normal in size with normal pulmonary vascularity.  No adenopathy.  Half there is arthropathy in both shoulders.  Central catheter tip is near the junction of the superior vena cava and right atrium.  Nasogastric tube extends into the stomach.  No pneumothorax.  IMPRESSION: Left base infiltrate.  No pneumothorax.  No change in cardiac silhouette.  Endotracheal tube no longer present.   Original Report Authenticated By: Arvin Collard. WOODRUFF III, M.D.     Medications:    . antiseptic oral rinse  15 mL Mouth Rinse QID  . chlorhexidine  15  mL Mouth Rinse BID  . ertapenem (INVANZ) IV  1 g Intravenous Q24H  . escitalopram  20 mg Oral Daily  . heparin  5,000 Units Subcutaneous Q8H  . hydroxychloroquine  200 mg Oral Daily  . influenza  inactive virus vaccine  0.5 mL Intramuscular Tomorrow-1000  . insulin aspart  0-5 Units Subcutaneous QHS  . insulin aspart  0-9 Units Subcutaneous TID WC  . micafungin (MYCAMINE) IV  100 mg Intravenous Q24H  . pantoprazole (PROTONIX) IV  40 mg Intravenous QHS  . pneumococcal 23 valent vaccine  0.5 mL Intramuscular Tomorrow-1000  . vancomycin  1,750 mg Intravenous Q24H  .  DISCONTD: docusate sodium  100 mg Oral BID  . DISCONTD: furosemide  40 mg Intravenous Q12H  . DISCONTD: insulin aspart  0-9 Units Subcutaneous TID WC  . DISCONTD: insulin aspart  2-6 Units Subcutaneous Q4H    Assessment/Plan S/p Left hemicolectomy with splenic flexure mobilization and end colostomy for evere constipation with stool a eroding through a necrotic sigmoid wall. Frank stool peritonitis. 04/04/12 Dr. Romie Levee. Sepsis with colon perforation and peritonitis ARF with dehydration/sepsis, improving VDRF, extubated POD 2. Bedridden for 2 months prior to admission. Diabetes mellitus type II  Hypertension  Depression/Anxiety/Panic attacks  Chronic pain syndrome/Fibromyalgia/Arthritis  GERD  BMI 58.5 Migraine   Plan:  I have shown staff how to pack the open abdominal wound. It currently takes a whole Kerlix. We   also talked about cleaning perineum and under the panus.  She needs to be up and will need OT/PT, they may even need a hoyer lift to help get her out of bed. I have talked with Nursing about how much help this pt will require, order a bariatric bed, and advance her diet.  We may even consider psych consult to help with depression and pain, but I will defer that to medicine. Labs in AM. Appreciate Dr. Robb Matar help. i have ask them to talk with wound care before placing Wound Vac.  I think it would be very easy to loose a piece of foam in her abdominal wound.       LOS: 4 days    [name] 04/08/2012

## 2012-04-08 NOTE — Progress Notes (Signed)
[facility] PROGRESS NOTE  Heidee Anania WUJ:[id number] DOB: 16-Dec-1955 DOA: 04/04/2012 PCP: [name], MD  Assessment/Plan: Acute diverticulitis with perforation s/p left mehicolectomy and colostomy -Per surgery. Agree with antibiotics, patient on diet has, but anorexic.  Sepsis syndrome, not in shock: - Now resolved, BP stable. -agree with antibiotics.   ARF (acute renal failure) -Continues to improve, with IV fluids. -most likely 2/2 to sepsis syndrome.  Fibromyalgia: -resume her home meds once she is able to tolerates PO. Patient is currently anorexic, so continue IV meds. -hold ms contin. -will PT consult and placement, currently has no place to be d/c too. Consulted SW.   Respiratory failure, post-operative -intubated for surgery s/p extubation on 10/1. -sating > 90% in 2 L  Code Status: full Disposition Plan: TBD  Procedures:  Left hemicolectomy  Antibiotics:  Invaz 9/29  Vanc 9/29  Micafungin 9/29  HPI/Subjective: Patient anorexic Labile emotion  Objective: Filed Vitals:   04/07/12 1354 04/07/12 2126 04/08/12 0240 04/08/12 0606  BP: 134/78 142/73 149/83 154/71  Pulse: 106 102 95 88  Temp: 99.4 F (37.4 C) 99 F (37.2 C) 98.4 F (36.9 C) 98.2 F (36.8 C)  TempSrc: Oral Oral Oral Oral  Resp: 22 18 20 18   Height:      Weight:      SpO2: 99% 94% 98% 96%    Intake/Output Summary (Last 24 hours) at 04/08/12 0734 Last data filed at 04/08/12 0606  Gross per 24 hour  Intake 626.67 ml  Output   1905 ml  Net -1278.33 ml   Filed Weights   04/05/12 0500 04/06/12 0000 04/07/12 0200  Weight: 137.2 kg (302 lb 7.5 oz) 137.4 kg (302 lb 14.6 oz) 135.4 kg (298 lb 8.1 oz)    Exam:   General:  A & O x3  Cardiovascular: RRR  Respiratory: good air movment  Abdomen: left stoma with bag.  Data Reviewed: Basic Metabolic Panel:  Lab 04/07/12 9562 04/06/12 0405 04/05/12 1508 04/05/12 0210 04/04/12 2220  NA 136 133* 134* 130* 130*  K 3.9  3.8 3.9 3.7 3.4*  CL 97 96 97 92* 92*  CO2 27 29 28 29 27   GLUCOSE 104* 129* 156* 212* 193*  BUN 36* 39* 32* 25* 23  CREATININE 1.19* 1.89* 2.03* 1.69* 1.48*  CALCIUM 8.3* 7.9* 7.6* 7.9* 7.8*  MG -- -- -- 1.6 1.4*  PHOS -- -- -- 4.1 3.1   Liver Function Tests:  Lab 04/07/12 0350 04/04/12 1206  AST 19 14  ALT 12 26  ALKPHOS 107 216*  BILITOT 0.3 0.6  PROT 5.4* 7.2  ALBUMIN 1.6* 2.7*   No results found for this basename: LIPASE:5,AMYLASE:5 in the last 168 hours No results found for this basename: AMMONIA:5 in the last 168 hours CBC:  Lab 04/07/12 0350 04/06/12 1415 04/06/12 0405 04/05/12 0210 04/04/12 2220 04/04/12 1206  WBC 19.6* 20.6* 19.5* 14.5* 8.7 --  NEUTROABS -- 18.0* 17.1* -- -- 17.1*  HGB 9.9* 9.9* 10.0* 12.4 11.9* --  HCT 30.6* 31.1* 30.9* 38.0 36.7 --  MCV 82.5 82.7 82.4 83.2 83.0 --  PLT 503* 492* 484* 485* 466* --   Cardiac Enzymes: No results found for this basename: CKTOTAL:5,CKMB:5,CKMBINDEX:5,TROPONINI:5 in the last 168 hours BNP (last 3 results)  Basename 04/07/12 0350 02/23/12 1323  PROBNP 260.8* 1049.0*   CBG:  Lab 04/07/12 1939 04/07/12 1611 04/07/12 1212 04/07/12 0801 04/07/12 0339  GLUCAP 172* 101* 93 108* 100*    Recent Results (from the past 240 hour(s))  MRSA PCR SCREENING     Status: Normal   Collection Time   04/04/12 10:13 PM      Component Value Range Status Comment   MRSA by PCR NEGATIVE  NEGATIVE Final   CULTURE, BLOOD (ROUTINE X 2)     Status: Normal (Preliminary result)   Collection Time   04/05/12  1:39 AM      Component Value Range Status Comment   Specimen Description Blood   Final    Special Requests Normal   Final    Culture  Setup Time 04/05/2012 17:41   Final    Culture     Final    Value:        BLOOD CULTURE RECEIVED NO GROWTH TO DATE CULTURE WILL BE HELD FOR 5 DAYS BEFORE ISSUING A FINAL NEGATIVE REPORT   Report Status PENDING   Incomplete   CULTURE, BLOOD (ROUTINE X 2)     Status: Normal (Preliminary result)    Collection Time   04/05/12  1:39 AM      Component Value Range Status Comment   Specimen Description Blood   Final    Special Requests Normal   Final    Culture  Setup Time 04/05/2012 17:41   Final    Culture     Final    Value:        BLOOD CULTURE RECEIVED NO GROWTH TO DATE CULTURE WILL BE HELD FOR 5 DAYS BEFORE ISSUING A FINAL NEGATIVE REPORT   Report Status PENDING   Incomplete   URINE CULTURE     Status: Normal   Collection Time   04/06/12  9:33 AM      Component Value Range Status Comment   Specimen Description URINE, CATHETERIZED   Final    Special Requests NONE   Final    Culture  Setup Time 04/07/2012 01:00   Final    Colony Count NO GROWTH   Final    Culture NO GROWTH   Final    Report Status 04/07/2012 FINAL   Final      Studies: Dg Chest Port 1 View  04/07/2012  *RADIOLOGY REPORT*  Clinical Data: Evaluate endotracheal tube.  PORTABLE CHEST - 1 VIEW  Comparison: 04/06/2012  Findings: No endotracheal tube identified.  An NG tube descends into the abdomen, tip not visualized.  A right IJ catheter tip projects over the cavoatrial junction.  Prominent cardiomediastinal contours with central vascular congestion.  Mild interstitial and perihilar airspace opacity. Mild retrocardiac opacity. Small effusions not excluded.  No pneumothorax.  No acute osseous finding.  IMPRESSION: Mild pulmonary edema pattern.  Mild retrocardiac opacity; atelectasis versus infiltrate.   Original Report Authenticated By: Waneta Martins, M.D.    Dg Chest Port 1 View  04/06/2012  *RADIOLOGY REPORT*  Clinical Data: Fever  PORTABLE CHEST - 1 VIEW  Comparison: April 04, 2012  Findings: There is focal infiltrate in the left base.  The lungs are otherwise clear.  Heart is upper normal in size with normal pulmonary vascularity.  No adenopathy.  Half there is arthropathy in both shoulders.  Central catheter tip is near the junction of the superior vena cava and right atrium.  Nasogastric tube extends into the  stomach.  No pneumothorax.  IMPRESSION: Left base infiltrate.  No pneumothorax.  No change in cardiac silhouette.  Endotracheal tube no longer present.   Original Report Authenticated By: Arvin Collard. WOODRUFF III, M.D.     Scheduled Meds:   . antiseptic oral rinse  15 mL Mouth Rinse QID  . chlorhexidine  15 mL Mouth Rinse BID  . ertapenem (INVANZ) IV  1 g Intravenous Q24H  . escitalopram  20 mg Oral Daily  . heparin  5,000 Units Subcutaneous Q8H  . hydroxychloroquine  200 mg Oral Daily  . influenza  inactive virus vaccine  0.5 mL Intramuscular Tomorrow-1000  . insulin aspart  0-9 Units Subcutaneous TID WC  . micafungin (MYCAMINE) IV  100 mg Intravenous Q24H  . pantoprazole (PROTONIX) IV  40 mg Intravenous QHS  . pneumococcal 23 valent vaccine  0.5 mL Intramuscular Tomorrow-1000  . vancomycin  1,750 mg Intravenous Q24H  . DISCONTD: docusate sodium  100 mg Oral BID  . DISCONTD: furosemide  40 mg Intravenous Q12H  . DISCONTD: insulin aspart  2-6 Units Subcutaneous Q4H   Continuous Infusions:   . sodium chloride 20 mL/hr (04/07/12 1746)      Time spent: 45 minutes    FELIZ Rosine Beat  [facility] Pager [phone]. If 8PM-8AM, please contact night-coverage at [email or web address], password [facility] 04/08/2012, 7:34 AM  LOS: 4 days

## 2012-04-08 NOTE — Evaluation (Signed)
Physical Therapy Evaluation Patient Details Name: Jasmine Huff MRN: [id number] DOB: 04/06/1956 Today's Date: 04/08/2012 Time: 1191-4782 PT Time Calculation (min): 44 min  PT Assessment / Plan / Recommendation Clinical Impression  spoke with department director and bedside RN regarding moving pt to the vacant maxisky room to allow increased OOB and for ease and safety of nursing staff; pt is extremely weak and deconditioned and will benefit from Pt to maximize independence  prior to next venue of care; pt very tearful at times regarding situation with her brother, not having " a home" to go to anymore; Pt  also perseverates on the nasal cannula and insists that she needs it on; Sats 91-92% on RA; Pt was on RA upon PT arrival    PT Assessment  Patient needs continued PT services    Follow Up Recommendations  Post acute inpatient rehab    Barriers to Discharge Decreased caregiver support      Equipment Recommendations  Rolling walker with 5" wheels (wide, short RW)    Recommendations for Other Services     Frequency Min 3X/week    Precautions / Restrictions Precautions Precautions: Fall   Pertinent Vitals/Pain       Mobility  Bed Mobility Bed Mobility: Rolling Right;Right Sidelying to Sit Rolling Right: 4: Min assist;3: Mod assist Right Sidelying to Sit: 1: +2 Total assist Right Sidelying to Sit: Patient Percentage: 60% Details for Bed Mobility Assistance: multi modal cues for technique, rolling, , self assist;pt used rail and HOB at 45*; pt requires increased time; assist with trunk; +2 required due pt size/lines/safety Transfers Transfers: Sit to Stand;Stand to Sit Sit to Stand: 1: +2 Total assist;From elevated surface;From bed Sit to Stand: Patient Percentage: 60% Stand to Sit: 1: +2 Total assist;To bed;With upper extremity assist Stand to Sit: Patient Percentage: 60% Transfer via Lift Equipment: Maximove Details for Transfer Assistance: cues for hands and wt shift to  come to 3/4 stand times 2; pt bracing on bed and onlly able to support wt on LEs for a few seconds before returning to sit; +2 for safety/lines/pt size;  Ambulation/Gait Ambulation/Gait Assistance: Not tested (comment) (unable)    Shoulder Instructions     Exercises General Exercises - Lower Extremity Ankle Circles/Pumps: AROM;10 reps;Both Other Exercises Other Exercises: instructed in adn encouraged use of incentive spirometer   PT Diagnosis: Difficulty walking;Generalized weakness  PT Problem List: Decreased strength;Decreased range of motion;Decreased activity tolerance;Decreased balance;Decreased mobility;Decreased safety awareness;Decreased knowledge of use of DME;Decreased cognition;Obesity PT Treatment Interventions: DME instruction;Gait training;Functional mobility training;Therapeutic activities;Therapeutic exercise;Balance training;Patient/family education   PT Goals Acute Rehab PT Goals PT Goal Formulation: With patient Time For Goal Achievement: 04/22/12 Potential to Achieve Goals: Fair Pt will Roll Supine to Right Side: with supervision PT Goal: Rolling Supine to Right Side - Progress: Goal set today Pt will go Supine/Side to Sit: with min assist PT Goal: Supine/Side to Sit - Progress: Goal set today Pt will go Sit to Stand: with min assist PT Goal: Sit to Stand - Progress: Goal set today Pt will go Stand to Sit: with min assist PT Goal: Stand to Sit - Progress: Goal set today Pt will Transfer Bed to Chair/Chair to Bed: with min assist PT Transfer Goal: Bed to Chair/Chair to Bed - Progress: Goal set today Pt will Ambulate: 1 - 15 feet;with min assist PT Goal: Ambulate - Progress: Goal set today  Visit Information  Last PT Received On: 04/08/12 Assistance Needed: +2 PT/OT Co-Evaluation/Treatment: Yes    Subjective Data  Subjective: pt labile; upset about brother not wanting to take care of her anymore   Prior Functioning  Home Living Lives With: Other (Comment)  (brother who pt states, "doesnt want her anymore.") Type of Home: House Home Access: Stairs to enter [employer] of Steps: 1 Home Layout: One level Bathroom Shower/Tub: Health visitor: Standard Home Adaptive Equipment: Walker - rolling;Straight cane Additional Comments: Pt bedridden for past 2 monthes. Prior Function Level of Independence: Needs assistance Needs Assistance: Light Housekeeping Light Housekeeping: Total Driving: Yes Vocation: On disability Communication Communication: No difficulties Dominant Hand: Right    Cognition  Arousal/Alertness: Awake/alert Orientation Level: Disoriented to Behavior During Session: Flat affect    Extremity/Trunk Assessment Right Lower Extremity Assessment RLE ROM/Strength/Tone: Deficits RLE ROM/Strength/Tone Deficits: difficult to assess due to copious soft tissue; ankle WFL; knee/hip grossly 3/5 RLE Sensation: Deficits Left Lower Extremity Assessment LLE ROM/Strength/Tone: Deficits;Unable to fully assess;Due to pain LLE ROM/Strength/Tone Deficits: pain in abd limiting LE movement as well as soft tissue approximiatio/panus; ankle WFL; knee and hip 2/5 to 2+/5   Balance    End of Session PT - End of Session Activity Tolerance: Patient limited by fatigue;Patient limited by pain Patient left: in chair;with call bell/phone within reach Nurse Communication: Mobility status;Need for lift equipment  GP     [facility] 04/08/2012, 11:18 AM

## 2012-04-08 NOTE — Progress Notes (Signed)
CSW met with pt today to offer support and assistance with d/c planning. SNF placement has been recommended by PT. Pt is agreeable with plan and SNF search in [place] / [place] has been initiated. PASRR has been submitted and # pending. Level 2 screening may be required due to MH dx's. CSW will continue to follow to assist with d/c planning needs.  Cori Razor LCSW [phone]

## 2012-04-08 NOTE — Progress Notes (Signed)
Looks ok.  Ask WOCN to see.m  Leave on clears

## 2012-04-08 NOTE — Care Management Note (Signed)
    Page 1 of 2   04/08/2012     1:48:21 PM   CARE MANAGEMENT NOTE 04/08/2012  Patient:  Jasmine Huff, Jasmine Huff   Account Number:  [id number]  Date Initiated:  04/05/2012  Documentation initiated by:  DAVIS,RHONDA  Subjective/Objective Assessment:   pt with perforated colon and requiring surgical intervention, in icu post-op due to inability to wean from the vent.  pre-op fibro-myalgia, arthritis, took pain meds for.  Chronic conspitation.     Action/Plan:   Patient has living with brother but on [id number] he has told her that she can not return to his house due to the level of care she needs.   Anticipated DC Date:  04/12/2012   Anticipated DC Plan:  SKILLED NURSING FACILITY  In-house referral  Clinical Social Worker      DC Planning Services  CM consult      [facility] Choice  NA   Choice offered to / List presented to:  NA   DME arranged  NA      DME agency  NA     HH arranged  NA      HH agency  NA   Status of service:  Completed, signed off Medicare Important Message given?  YES (If response is "NO", the following Medicare IM given date fields will be blank) Date Medicare IM given:  04/04/2012 Date Additional Medicare IM given:    Discharge Disposition:  SKILLED NURSING FACILITY  Per UR Regulation:  Reviewed for med. necessity/level of care/duration of stay  If discussed at Long Length of Stay Meetings, dates discussed:    Comments:  [id number]/Rhonda Earlene Plater, RN, BSN, CCM: CHART REVIEWED AND UPDATED. NO DISCHARGE NEEDS PRESENT AT THIS TIME. CASE MANAGEMENT [phone]

## 2012-04-08 NOTE — Clinical Social Work Note (Signed)
ICU CSW completed assessment on 04/07/12 and is in chart on said date. Pt will need SNF placement and will need PT/OT to assist with placement. Pt has h/o severe depression and was on multiple medications for this prior to admission. CSW transferring case to 5 Chad CSW, Producer, television/film/video.   Doreen Salvage, LCSWA ICU/Stepdown Clinical Social Worker [facility] Cell [phone] Hours 8am-1200pm M-F

## 2012-04-08 NOTE — Plan of Care (Signed)
Problem: Phase II Progression Outcomes Goal: Surgical site without signs of infection Outcome: Completed/Met Date Met:  04/08/12 Wound vac placed by Evlyn Courier, RN this afternoon. Goal: Dressings dry/intact Outcome: Completed/Met Date Met:  04/08/12 Wound vac in place and intact.

## 2012-04-08 NOTE — Consult Note (Signed)
WOC consult Note Reason for Consult:Placement of abdominal wound VAC. Wound type:Surgical Pressure Ulcer POA:/No Measurement: 21x8x9.5cm Wound JXB:JYNW pink, moist Drainage (amount, consistency, odor) scant amount of dry exudate on old dressing Periwound:intact Dressing procedure/placement/frequency:NPWT applied without difficulty.  Procedure explained and tolerated well.  Continuous pressure.  Two pieces of black foam used as wound filler/contact layer.  Change today, Saturday then M-W-F.  Ostomy pouch changed simultaneously due to proximity of wound to stoma Staff may change VAC and pouch.  Our team will follow. Thanks, Ladona Mow, MSN, RN, [facility], CWOCN [phone])

## 2012-04-08 NOTE — Progress Notes (Signed)
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 Pt severely weak and deconditioned. Utilized [employer] for [employer]. Pt also very emotionally labile. Frequently crying, stating her brother didnt want to take care of her anymore. Skilled OT indicated to maximize independence with BADLs to decrease burden of care at next venue.    OT Assessment  Patient needs continued OT Services    Follow Up Recommendations  Skilled nursing facility    Barriers to Discharge Inaccessible home environment;Decreased caregiver support    Equipment Recommendations  Rolling walker with 5" wheels;3 in 1 bedside comode;Other (comment) (wide & short)    Recommendations for Other Services    Frequency  Min 2X/week    Precautions / Restrictions Precautions Precautions: Fall   Pertinent Vitals/Pain Reported 7/10 abdominal pain. Repositioned for comfort.    ADL  Grooming: Performed;Set up;Wash/dry face;Brushing hair Where Assessed - Grooming: Unsupported sitting Toilet Transfer: Simulated;+2 Total assistance Toilet Transfer: Patient Percentage: 0% Toilet Transfer Method: Other (comment) (utilized maxi move) Toileting - Architect and Hygiene: Simulated;+2 Total assistance Toileting - Architect and Hygiene: Patient Percentage: 60% Where Assessed - Toileting Clothing Manipulation and Hygiene: Supine, head of bed flat;Rolling right and/or left ADL Comments: Pt with very limited endurance, poor activity tolerance. Attempted x2 to stand from EOB but pt was barely able to clear buttocks from the bed.    OT Diagnosis: Generalized weakness  OT Problem List: Decreased strength;Decreased activity  tolerance;Decreased safety awareness;Decreased knowledge of use of DME or AE;Pain OT Treatment Interventions: Self-care/ADL training;Therapeutic activities;DME and/or AE instruction;Patient/family education;Therapeutic exercise   OT Goals Acute Rehab OT Goals OT Goal Formulation: With patient Time For Goal Achievement: 04/22/12 Potential to Achieve Goals: Good ADL Goals Pt Will Perform Grooming: with set-up;Sitting, edge of bed;Sitting, chair;Unsupported (x 3-4 tasks to improve activity tolerance.) ADL Goal: Grooming - Progress: Goal set today Pt Will Transfer to Toilet: with mod assist;Extra wide 3-in-1;Squat pivot transfer;Stand pivot transfer ADL Goal: Toilet Transfer - Progress: Goal set today Pt Will Perform Toileting - Clothing Manipulation: with max assist;Sitting on 3-in-1 or toilet;Standing ADL Goal: Toileting - Clothing Manipulation - Progress: Goal set today Pt Will Perform Toileting - Hygiene: with max assist;Sit to stand from 3-in-1/toilet ADL Goal: Toileting - Hygiene - Progress: Goal set today Additional ADL Goal #1: Pt will complete supine to sit with min A in prep for seated ADL  ADL Goal: Additional Goal #1 - Progress: Goal set today Additional ADL Goal #2: Pt will tolerate 20 min of TA with minimal RBs in prep for ADLs. ADL Goal: Additional Goal #2 - Progress: Goal set today Arm Goals Pt Will Complete Theraband Exer: with supervision, verbal cues required/provided;Bilateral upper extremities;to increase strength Arm Goal: Theraband Exercises - Progress: Goal set today  Visit Information  Last OT Received On: 04/08/12 Assistance Needed: +2 PT/OT Co-Evaluation/Treatment: Yes    Subjective Data  Subjective: My brother doesn't want me anymore.  Patient Stated Goal: Pt emotionally labile. Unable to state.   Prior Functioning     Home Living Lives With: Other (Comment) (brother who pt states, "doesnt want her anymore.") Type of Home: House Home Access: Stairs to  enter [employer] of Steps: 1 Home Layout: One level Bathroom Shower/Tub: Health visitor: Standard Home Adaptive Equipment: Environmental consultant -  rolling;Straight cane Additional Comments: Pt bedridden for past 2 monthes. Prior Function Level of Independence: Needs assistance Needs Assistance: Light Housekeeping Light Housekeeping: Total Driving: Yes Vocation: On disability Communication Communication: No difficulties Dominant Hand: Right         Vision/Perception     Cognition  Overall Cognitive Status: No family/caregiver present to determine baseline cognitive functioning Arousal/Alertness: Awake/alert Orientation Level: Disoriented to;Time;Situation Behavior During Session: Other (comment) (emotional) Cognition - Other Comments: Pt very emotionally labile, abruptly crying several times throughout eval. Pt also appears to have a poor memory. Only a couple of minutes after explaining to pt that O2 was discontinued and how to utilize spirometer, she was asking for her cannula.    Extremity/Trunk Assessment Right Upper Extremity Assessment RUE ROM/Strength/Tone: Deficits RUE ROM/Strength/Tone Deficits: generalized weakness throughout Left Upper Extremity Assessment LUE ROM/Strength/Tone: Deficits LUE ROM/Strength/Tone Deficits: generalized weakness throughout Right Lower Extremity Assessment RLE ROM/Strength/Tone: Deficits RLE ROM/Strength/Tone Deficits: difficult to assess due to copious soft tissue; ankle WFL; knee/hip grossly 3/5 RLE Sensation: Deficits Left Lower Extremity Assessment LLE ROM/Strength/Tone: Deficits;Unable to fully assess;Due to pain LLE ROM/Strength/Tone Deficits: pain in abd limiting LE movement as well as soft tissue approximiatio/panus; ankle WFL; knee and hip 2/5 to 2+/5     Mobility Bed Mobility Bed Mobility: Rolling Right;Right Sidelying to Sit Rolling Right: 4: Min assist;3: Mod assist Right Sidelying to Sit: 1: +2 Total  assist Right Sidelying to Sit: Patient Percentage: 60% Details for Bed Mobility Assistance: multi modal cues for technique, rolling, , self assist;pt used rail and HOB at 45*; pt requires increased time; assist with trunk; +2 required due pt size/lines/safety Transfers Sit to Stand: 1: +2 Total assist;From elevated surface;From bed Sit to Stand: Patient Percentage: 60% Stand to Sit: 1: +2 Total assist;To bed;With upper extremity assist Stand to Sit: Patient Percentage: 60% Transfer via Lift Equipment: [employer] Details for Transfer Assistance: cues for hands and wt shift to come to 3/4 stand times 2; pt bracing on bed and onlly able to support wt on LEs for a few seconds before returning to sit; +2 for safety/lines/pt size;      Shoulder Instructions     Exercise  Other Exercises Other Exercises: instructed in adn encouraged use of incentive spirometer   Balance Static Sitting Balance Static Sitting - Balance Support: Left upper extremity supported;Feet supported Static Sitting - Level of Assistance: 5: Stand by assistance   End of Session OT - End of Session Equipment Utilized During Treatment: Gait belt Activity Tolerance: Patient limited by pain;Patient limited by fatigue Patient left: in chair;with call bell/phone within reach Nurse Communication: Need for lift equipment  GO     Klarisa Barman A OTR/L [phone] 04/08/2012, 11:31 AM

## 2012-04-09 DIAGNOSIS — F329 Major depressive disorder, single episode, unspecified: Secondary | ICD-10-CM

## 2012-04-09 LAB — COMPREHENSIVE METABOLIC PANEL
AST: 19 U/L (ref 0–37)
BUN: 17 mg/dL (ref 6–23)
CO2: 28 mEq/L (ref 19–32)
Calcium: 8.2 mg/dL — ABNORMAL LOW (ref 8.4–10.5)
Chloride: 100 mEq/L (ref 96–112)
Creatinine, Ser: 0.77 mg/dL (ref 0.50–1.10)
GFR calc Af Amer: >90 mL/min (ref >90)
GFR calc non Af Amer: >90 mL/min (ref >90)
Glucose, Bld: 146 mg/dL — ABNORMAL HIGH (ref 70–99)
Total Bilirubin: 0.2 mg/dL — ABNORMAL LOW (ref 0.3–1.2)

## 2012-04-09 LAB — CBC
HCT: 32.3 % — ABNORMAL LOW (ref 36.0–46.0)
Hemoglobin: 10.3 g/dL — ABNORMAL LOW (ref 12.0–15.0)
MCH: 26.5 pg (ref 26.0–34.0)
MCV: 83 fL (ref 78.0–100.0)
RBC: 3.89 MIL/uL (ref 3.87–5.11)
WBC: 19.5 10*3/uL — ABNORMAL HIGH (ref 4.0–10.5)

## 2012-04-09 LAB — GLUCOSE, CAPILLARY: Glucose-Capillary: 131 mg/dL — ABNORMAL HIGH (ref 70–99)

## 2012-04-09 MED ORDER — VANCOMYCIN HCL 1000 MG IV SOLR
1250.0000 mg | Freq: Two times a day (BID) | INTRAVENOUS | Status: DC
  Administered 2012-04-09 – 2012-04-10 (×2): 1250 mg via INTRAVENOUS
  Filled 2012-04-09 (×3): qty 1250

## 2012-04-09 MED ORDER — POTASSIUM CHLORIDE 10 MEQ/100ML IV SOLN
10.0000 meq | INTRAVENOUS | Status: AC
  Administered 2012-04-09 (×3): 10 meq via INTRAVENOUS
  Filled 2012-04-09 (×7): qty 100

## 2012-04-09 MED ORDER — OXYCODONE-ACETAMINOPHEN 5-325 MG PO TABS
1.0000 | ORAL_TABLET | ORAL | Status: DC | PRN
  Administered 2012-04-09 – 2012-04-16 (×26): 1 via ORAL
  Filled 2012-04-09 (×26): qty 1

## 2012-04-09 MED ORDER — MORPHINE SULFATE ER 30 MG PO TBCR
30.0000 mg | EXTENDED_RELEASE_TABLET | Freq: Two times a day (BID) | ORAL | Status: DC
  Administered 2012-04-09 – 2012-04-16 (×15): 30 mg via ORAL
  Filled 2012-04-09 (×16): qty 1

## 2012-04-09 MED ORDER — TRAZODONE HCL 50 MG PO TABS
50.0000 mg | ORAL_TABLET | Freq: Every evening | ORAL | Status: DC | PRN
  Administered 2012-04-10 – 2012-04-11 (×3): 50 mg via ORAL
  Filled 2012-04-09 (×3): qty 1

## 2012-04-09 MED ORDER — ARIPIPRAZOLE 5 MG PO TABS
5.0000 mg | ORAL_TABLET | Freq: Every day | ORAL | Status: DC
  Administered 2012-04-09 – 2012-04-16 (×8): 5 mg via ORAL
  Filled 2012-04-09 (×8): qty 1

## 2012-04-09 MED ORDER — CLONAZEPAM 0.5 MG PO TABS
0.5000 mg | ORAL_TABLET | Freq: Three times a day (TID) | ORAL | Status: DC | PRN
  Administered 2012-04-10 – 2012-04-16 (×12): 0.5 mg via ORAL
  Filled 2012-04-09 (×12): qty 1

## 2012-04-09 MED ORDER — OXYCODONE-ACETAMINOPHEN 10-325 MG PO TABS: 1.0000 | ORAL_TABLET | ORAL | Status: DC | PRN

## 2012-04-09 MED ORDER — OXYCODONE HCL 5 MG PO TABS
5.0000 mg | ORAL_TABLET | ORAL | Status: DC | PRN
  Administered 2012-04-09 – 2012-04-16 (×25): 5 mg via ORAL
  Filled 2012-04-09 (×26): qty 1

## 2012-04-09 MED ORDER — POTASSIUM CHLORIDE 10 MEQ/100ML IV SOLN
10.0000 meq | INTRAVENOUS | Status: AC
  Administered 2012-04-09: 10 meq via INTRAVENOUS
  Filled 2012-04-09: qty 100

## 2012-04-09 MED ORDER — ZOLPIDEM TARTRATE 10 MG PO TABS
10.0000 mg | ORAL_TABLET | Freq: Every evening | ORAL | Status: DC | PRN
  Administered 2012-04-12 – 2012-04-14 (×4): 10 mg via ORAL
  Filled 2012-04-09 (×4): qty 1

## 2012-04-09 MED ORDER — MORPHINE SULFATE ER 30 MG PO TBCR: 60.0000 mg | EXTENDED_RELEASE_TABLET | Freq: Two times a day (BID) | ORAL | Status: DC | PRN

## 2012-04-09 NOTE — Progress Notes (Signed)
ANTIBIOTIC CONSULT NOTE - FOLLOW UP  Pharmacy Consult for Vancomycin Indication: Sepsis (PNA, acute diverticulitis with colon perf and peritonitis)  Allergies  Allergen Reactions  . Pregabalin Rash    Alters Mental status, flushing  . Saphris (Asenapine Maleate) Shortness Of Breath and Other (See Comments)    Lips tingle and tongue becomes numb/Akasthesia  . Iodinated Diagnostic Agents     Dye used for CT scans, severe hives  . Seroquel (Quetiapine Fumarate)     Patient Measurements: Height: 5' (152.4 cm) Weight: 298 lb 8.1 oz (135.4 kg) IBW/kg (Calculated) : 45.5   Vital Signs: Temp: 98.1 F (36.7 C) (10/04 0642) Temp src: Oral (10/04 0642) BP: 131/79 mmHg (10/04 0642) Pulse Rate: 93  (10/04 0642) Intake/Output from previous day: 10/03 0701 - 10/04 0700 In: 720 [P.O.:720] Out: 1870 [Urine:1250; Drains:70; Stool:550] Intake/Output from this shift: Total I/O In: 120 [P.O.:120] Out: -   Labs:  Basename 04/09/12 0455 04/07/12 0350 04/06/12 1415  WBC 19.5* 19.6* 20.6*  HGB 10.3* 9.9* 9.9*  PLT 599* 503* 492*  LABCREA -- -- --  CREATININE 0.77 1.19* --   Estimated Creatinine Clearance: 101 ml/min (by C-G formula based on Cr of 0.77). No results found for this basename: VANCOTROUGH:2,VANCOPEAK:2,VANCORANDOM:2,GENTTROUGH:2,GENTPEAK:2,GENTRANDOM:2,TOBRATROUGH:2,TOBRAPEAK:2,TOBRARND:2,AMIKACINPEAK:2,AMIKACINTROU:2,AMIKACIN:2, in the last 72 hours   Assessment:  56 yof on D#6 of Ertapenem, micafungin per MD and D#4 Vancomycin per pharmacy for sepsis (PNA, acute diverticulitis with colon perf and peritonitis s/p left hemicolectomy and colostomy)  Afebrile, WBC remains elevated and stabilizing around 19K.  Scr greatly improved with crCl now 100 ml/min, normalized crcl 89 ml/min  Blood cultures pending, urine culture negative  Patient missed vancomycin dose on 10/2, no reason documented.  Unable to obtain vancomycin trough now given missed dose  Given current antibiotic  regimen, patient is missing pseudomonas coverage, risk increased given recent hospitalization.  Please add if concern.   Goal of Therapy:  Vancomycin trough level 15-20 mcg/ml  Plan:   Given improved renal function, will change vancomycin to 1250 mg IV q12h empirically  Pharmacy will f/u   Geoffry Paradise Thi 04/09/2012,10:43 AM

## 2012-04-09 NOTE — Progress Notes (Signed)
CSW following to assist with d/c planning. SNF bed offers provided to pt this afternoon. Pt will review list and let CSW know which facility she chooses for placement. CSW will continue to follow to assist with d/c planning needs.  Cori Razor LCSW

## 2012-04-09 NOTE — Progress Notes (Signed)
Sent Dr. Robb Matar a message that patient has had approx out in ostomy and of urine output

## 2012-04-09 NOTE — Progress Notes (Signed)
[facility] PROGRESS NOTE  Carola Viramontes ZOX:[id number] DOB: 18-Apr-1956 DOA: 04/04/2012 PCP: [name], MD  Assessment/Plan: Acute diverticulitis with perforation s/p left mehicolectomy and colostomy -Per surgery. Agree with antibiotics, patient on diet has, but anorexic.  Sepsis syndrome, not in shock: - Now resolved, BP stable. -agree with antibiotics.   ARF (acute renal failure) -Resolved, with IV fluids. -most likely 2/2 to sepsis syndrome. -will follow at a distance.  Fibromyalgia: -resume her home pain meds. -resume ms contin. -will PT consult recommended  Rehab.   Respiratory failure, post-operative -intubated for surgery s/p extubation on 10/1. -sating > 90% in 2 L  Depression: Syq. Consult.  Code Status: full Disposition Plan: SNF will not  Qualify for CIR.  Procedures:  Left hemicolectomy  Antibiotics:  Invaz 9/29  Vanc 9/29  Micafungin 9/29  HPI/Subjective: Labile emotion  Objective: Filed Vitals:   04/08/12 0606 04/08/12 0934 04/08/12 2151 04/09/12 0642  BP: 154/71 142/82 126/81 131/79  Pulse: 88 91 86 93  Temp: 98.2 F (36.8 C) 98.6 F (37 C) 99.5 F (37.5 C) 98.1 F (36.7 C)  TempSrc: Oral Oral Oral Oral  Resp: 18 18 18 20   Height:      Weight:      SpO2: 96% 98% 97% 98%    Intake/Output Summary (Last 24 hours) at 04/09/12 0754 Last data filed at 04/09/12 0557  Gross per 24 hour  Intake    720 ml  Output   1870 ml  Net  -1150 ml   Filed Weights   04/05/12 0500 04/06/12 0000 04/07/12 0200  Weight: 137.2 kg (302 lb 7.5 oz) 137.4 kg (302 lb 14.6 oz) 135.4 kg (298 lb 8.1 oz)    Exam:   General:  A & O x3  Cardiovascular: RRR  Respiratory: good air movment  Abdomen: left stoma with bag.  Data Reviewed: Basic Metabolic Panel:  Lab 04/09/12 8119 04/07/12 0350 04/06/12 0405 04/05/12 1508 04/05/12 0210 04/04/12 2220  NA 139 136 133* 134* 130* --  K 3.3* 3.9 3.8 3.9 3.7 --  CL 100 97 96 97 92* --  CO2 28 27  29 28 29  --  GLUCOSE 146* 104* 129* 156* 212* --  BUN 17 36* 39* 32* 25* --  CREATININE 0.77 1.19* 1.89* 2.03* 1.69* --  CALCIUM 8.2* 8.3* 7.9* 7.6* 7.9* --  MG -- -- -- -- 1.6 1.4*  PHOS -- -- -- -- 4.1 3.1   Liver Function Tests:  Lab 04/09/12 0455 04/07/12 0350 04/04/12 1206  AST 19 19 14   ALT 14 12 26   ALKPHOS 84 107 216*  BILITOT 0.2* 0.3 0.6  PROT 5.8* 5.4* 7.2  ALBUMIN 1.7* 1.6* 2.7*   No results found for this basename: LIPASE:5,AMYLASE:5 in the last 168 hours No results found for this basename: AMMONIA:5 in the last 168 hours CBC:  Lab 04/09/12 0455 04/07/12 0350 04/06/12 1415 04/06/12 0405 04/05/12 0210 04/04/12 1206  WBC 19.5* 19.6* 20.6* 19.5* 14.5* --  NEUTROABS -- -- 18.0* 17.1* -- 17.1*  HGB 10.3* 9.9* 9.9* 10.0* 12.4 --  HCT 32.3* 30.6* 31.1* 30.9* 38.0 --  MCV 83.0 82.5 82.7 82.4 83.2 --  PLT 599* 503* 492* 484* 485* --   Cardiac Enzymes: No results found for this basename: CKTOTAL:5,CKMB:5,CKMBINDEX:5,TROPONINI:5 in the last 168 hours BNP (last 3 results)  Basename 04/07/12 0350 02/23/12 1323  PROBNP 260.8* 1049.0*   CBG:  Lab 04/09/12 0740 04/08/12 2208 04/08/12 1612 04/08/12 1210 04/08/12 0729  GLUCAP 131* 145* 120* 181*  142*    Recent Results (from the past 240 hour(s))  MRSA PCR SCREENING     Status: Normal   Collection Time   04/04/12 10:13 PM      Component Value Range Status Comment   MRSA by PCR NEGATIVE  NEGATIVE Final   CULTURE, BLOOD (ROUTINE X 2)     Status: Normal (Preliminary result)   Collection Time   04/05/12  1:39 AM      Component Value Range Status Comment   Specimen Description Blood   Final    Special Requests Normal   Final    Culture  Setup Time 04/05/2012 17:41   Final    Culture     Final    Value:        BLOOD CULTURE RECEIVED NO GROWTH TO DATE CULTURE WILL BE HELD FOR 5 DAYS BEFORE ISSUING A FINAL NEGATIVE REPORT   Report Status PENDING   Incomplete   CULTURE, BLOOD (ROUTINE X 2)     Status: Normal (Preliminary  result)   Collection Time   04/05/12  1:39 AM      Component Value Range Status Comment   Specimen Description Blood   Final    Special Requests Normal   Final    Culture  Setup Time 04/05/2012 17:41   Final    Culture     Final    Value:        BLOOD CULTURE RECEIVED NO GROWTH TO DATE CULTURE WILL BE HELD FOR 5 DAYS BEFORE ISSUING A FINAL NEGATIVE REPORT   Report Status PENDING   Incomplete   URINE CULTURE     Status: Normal   Collection Time   04/06/12  9:33 AM      Component Value Range Status Comment   Specimen Description URINE, CATHETERIZED   Final    Special Requests NONE   Final    Culture  Setup Time 04/07/2012 01:00   Final    Colony Count NO GROWTH   Final    Culture NO GROWTH   Final    Report Status 04/07/2012 FINAL   Final      Studies: No results found.  Scheduled Meds:    . antiseptic oral rinse  15 mL Mouth Rinse QID  . chlorhexidine  15 mL Mouth Rinse BID  . ertapenem (INVANZ) IV  1 g Intravenous Q24H  . escitalopram  30 mg Oral Daily  . heparin  5,000 Units Subcutaneous Q8H  . hydroxychloroquine  200 mg Oral Daily  . insulin aspart  0-5 Units Subcutaneous QHS  . insulin aspart  0-9 Units Subcutaneous TID WC  . micafungin (MYCAMINE) IV  100 mg Intravenous Q24H  . pantoprazole (PROTONIX) IV  40 mg Intravenous QHS  . pneumococcal 23 valent vaccine  0.5 mL Intramuscular Tomorrow-1000  . vancomycin  1,750 mg Intravenous Q24H  . DISCONTD: escitalopram  20 mg Oral Daily  . DISCONTD: insulin aspart  0-9 Units Subcutaneous TID WC   Continuous Infusions:    . sodium chloride 20 mL/hr (04/07/12 1746)      Time spent: 45 minutes    FELIZ Rosine Beat  [facility] Pager [phone]. If 8PM-8AM, please contact night-coverage at [email or web address], password [facility] 04/09/2012, 7:54 AM  LOS: 5 days

## 2012-04-09 NOTE — Progress Notes (Signed)
Physical Therapy Treatment Patient Details Name: Jasmine Huff MRN: [id number] DOB: 04/09/1956 Today's Date: 04/09/2012 Time: 9562-1308 PT Time Calculation (min): 23 min  PT Assessment / Plan / Recommendation Comments on Treatment Session  Notified nursing staff of pt's inability to sit for long periods due to back/sciatic pain. Encouraged pt to sit for 2 hours, but explained to pt that she will need to get up again later this afternoon to sit for another 2 hours-pt verbalized understanding. Improved mobility this session. Pt able to take a few steps and pivot to chair with RW with +2 assist for safety-notified nursing staff as well. However, left lift pad in chair in case pt has increased difficulty later.     Follow Up Recommendations  Post acute inpatient rehab-Skilled Nursing Facility    Barriers to Discharge        Equipment Recommendations  Rolling walker with 5" wheels;3 in 1 bedside comode (wide)    Recommendations for Other Services    Frequency Min 3X/week   Plan Discharge plan remains appropriate    Precautions / Restrictions Precautions Precautions: Fall Precaution Comments: abdominal surgery, drains Restrictions Weight Bearing Restrictions: No   Pertinent Vitals/Pain 7/10 abdomen    Mobility  Bed Mobility Bed Mobility: Rolling Right;Right Sidelying to Sit Rolling Right: 4: Min guard Right Sidelying to Sit: 4: Min assist;HOB elevated;With rails Details for Bed Mobility Assistance: Assist for trunk to upright and management of lines. Reliance on bedrail.  Transfers Transfers: Sit to Stand;Stand to Sit;Stand Pivot Transfers Sit to Stand: 1: +2 Total assist Sit to Stand: Patient Percentage: 80% Stand to Sit: 1: +2 Total assist Stand to Sit: Patient Percentage: 80% Stand Pivot Transfers: 1: +2 Total assist Stand Pivot Transfers: Patient Percentage: 80% Details for Transfer Assistance: VCs safety, technique, hand placement. Assist to rise, stabilize, control descent.   Ambulation/Gait Ambulation/Gait Assistance: 1: +2 Total assist Ambulation/Gait: Patient Percentage: 80% Ambulation Distance (Feet): 3 Feet Assistive device: Rolling walker Ambulation/Gait Assistance Details: Assist to stabilize. +2 for safety.  Gait Pattern: Step-through pattern;Decreased stride length;Decreased step length - right;Decreased step length - left    Exercises General Exercises - Lower Extremity Long Arc Quad: AROM;Both;10 reps;Seated   PT Diagnosis:    PT Problem List:   PT Treatment Interventions:     PT Goals Acute Rehab PT Goals Pt will Roll Supine to Right Side: with supervision PT Goal: Rolling Supine to Right Side - Progress: Progressing toward goal Pt will go Supine/Side to Sit: with supervision PT Goal: Supine/Side to Sit - Progress: Updated due to goal met Pt will go Sit to Stand: with min assist PT Goal: Sit to Stand - Progress: Progressing toward goal Pt will go Stand to Sit: with min assist PT Goal: Stand to Sit - Progress: Progressing toward goal Pt will Transfer Bed to Chair/Chair to Bed: with min assist PT Transfer Goal: Bed to Chair/Chair to Bed - Progress: Progressing toward goal Pt will Ambulate: 1 - 15 feet;with min assist PT Goal: Ambulate - Progress: Progressing toward goal  Visit Information  Last PT Received On: 04/09/12 Assistance Needed: +2    Subjective Data  Subjective: "I have back pain and I can't sit for too long-only a couple of hours." (pt tearful) Patient Stated Goal: Get better   Cognition  Overall Cognitive Status: Appears within functional limits for tasks assessed/performed Arousal/Alertness: Awake/alert Behavior During Session: Other (comment) Cognition - Other Comments: emotional/tearful.     Balance     End of Session PT - End  of Session Activity Tolerance: Patient limited by pain Patient left: in chair;with call bell/phone within reach Nurse Communication: Mobility status   GP     Rebeca Alert  [facility] 04/09/2012, 10:51 AM [phone]

## 2012-04-09 NOTE — Progress Notes (Signed)
With psychiatry currently.   Slowly improving.  Wound vac.  Ostomy viable  Min output.  Continue antibiotics.

## 2012-04-09 NOTE — ED Provider Notes (Signed)
Medical screening examination/treatment/procedure(s) were performed by non-physician practitioner and as supervising physician I was immediately available for consultation/collaboration.   Suzi Roots, MD 04/09/12 [phone]

## 2012-04-09 NOTE — Progress Notes (Signed)
Per psych MD, no psych CSW needs identified, as Pt is to d/c to SNF.  No further psych CSW needs identified.  Psych CSW to sign off.  Providence Crosby, LCSWA Clinical Social Work [phone]

## 2012-04-09 NOTE — Progress Notes (Signed)
Clinical Social Work Department CLINICAL SOCIAL WORK PLACEMENT NOTE 04/09/2012  Patient:  Jasmine Huff, Jasmine Huff  Account Number:  [id number] Admit date:  04/04/2012  Clinical Social Worker:  Cori Razor, LCSW  Date/time:  04/09/2012 04:26 PM  Clinical Social Work is seeking post-discharge placement for this patient at the following level of care:   SKILLED NURSING   (*CSW will update this form in Epic as items are completed)   04/08/2012  Patient/family provided with [facility] Department of Clinical Social Work's list of facilities offering this level of care within the geographic area requested by the patient (or if unable, by the patient's family).  04/08/2012  Patient/family informed of their freedom to choose among providers that offer the needed level of care, that participate in Medicare, Medicaid or managed care program needed by the patient, have an available bed and are willing to accept the patient.  04/08/2012  Patient/family informed of [facility]' ownership interest in [facility], as well as of the fact that they are under no obligation to receive care at this facility.  PASARR submitted to EDS on 04/09/2012 PASARR number received from [employer] on 04/09/2012  FL2 transmitted to all facilities in geographic area requested by pt/family on  04/08/2012 FL2 transmitted to all facilities within larger geographic area on   Patient informed that his/her managed care company has contracts with or will negotiate with  certain facilities, including the following:     Patient/family informed of bed offers received:  04/09/2012 Patient chooses bed at  Physician recommends and patient chooses bed at    Patient to be transferred to  on   Patient to be transferred to facility by   The following physician request were entered in Epic:   Additional Comments:  Cori Razor LCSW [phone]

## 2012-04-09 NOTE — Progress Notes (Signed)
DOING OK.  WOUND VAC IN PLACE.  ON CLEARS.

## 2012-04-09 NOTE — Progress Notes (Addendum)
5 Days Post-Op  Subjective: Lying in bed, says she's having pain.  Also complaining of depression and sounds like she's confined to a small bed room in her brothers home, with no place else to go.  Objective: Vital signs in last 24 hours: Temp:  [98.6 F (37 C)-99.5 F (37.5 C)] 99.5 F (37.5 C) (10/03 2151) Pulse Rate:  [86-91] 86  (10/03 2151) Resp:  [18] 18  (10/03 2151) BP: (126-142)/(81-82) 126/81 mmHg (10/03 2151) SpO2:  [97 %-98 %] 97 % (10/03 2151) Last BM Date: 04/08/12 480 PO recorded, 350 ml stool recorded. Tm 99.5, VSS, K+ 3.3, WBC still up 19.6, no change last 48 hours Day 5 Invanz, day 5 micafungin, on vancomycin since 10/1 Intake/Output from previous day: 10/03 0701 - 10/04 0700 In: 480 [P.O.:480] Out: 1140 [Urine:750; Drains:40; Stool:350] Intake/Output this shift:    General appearance: alert, cooperative, no distress and very anxious Resp: clear to auscultation bilaterally and anterior exam GI: few BS, gas and stool, mostly liquid in her ostomy bag. Wound vac now in place open abdominal wound.  Lab Results:   Basename 04/09/12 0455 04/07/12 0350  WBC 19.5* 19.6*  HGB 10.3* 9.9*  HCT 32.3* 30.6*  PLT 599* 503*    BMET  Basename 04/09/12 0455 04/07/12 0350  NA 139 136  K 3.3* 3.9  CL 100 97  CO2 28 27  GLUCOSE 146* 104*  BUN 17 36*  CREATININE 0.77 1.19*  CALCIUM 8.2* 8.3*   PT/INR No results found for this basename: LABPROT:2,INR:2 in the last 72 hours   Lab 04/09/12 0455 04/07/12 0350 04/04/12 1206  AST 19 19 14   ALT 14 12 26   ALKPHOS 84 107 216*  BILITOT 0.2* 0.3 0.6  PROT 5.8* 5.4* 7.2  ALBUMIN 1.7* 1.6* 2.7*     Lipase  No results found for this basename: lipase     Studies/Results: No results found.  Medications:    . antiseptic oral rinse  15 mL Mouth Rinse QID  . chlorhexidine  15 mL Mouth Rinse BID  . ertapenem (INVANZ) IV  1 g Intravenous Q24H  . escitalopram  30 mg Oral Daily  . heparin  5,000 Units Subcutaneous  Q8H  . hydroxychloroquine  200 mg Oral Daily  . insulin aspart  0-5 Units Subcutaneous QHS  . insulin aspart  0-9 Units Subcutaneous TID WC  . micafungin (MYCAMINE) IV  100 mg Intravenous Q24H  . pantoprazole (PROTONIX) IV  40 mg Intravenous QHS  . pneumococcal 23 valent vaccine  0.5 mL Intramuscular Tomorrow-1000  . vancomycin  1,750 mg Intravenous Q24H  . DISCONTD: escitalopram  20 mg Oral Daily  . DISCONTD: insulin aspart  0-9 Units Subcutaneous TID WC    Assessment/Plan S/p Left hemicolectomy with splenic flexure mobilization and end colostomy for evere constipation with stool a eroding through a necrotic sigmoid wall. Frank stool peritonitis. 04/04/12 Dr. Romie Levee.  Sepsis with colon perforation and peritonitis  ARF with dehydration/sepsis, improving  VDRF, extubated POD 2.  Bedridden for 2 months prior to admission.  Diabetes mellitus type II  Hypertension  Depression/Anxiety/Panic attacks  Chronic pain syndrome/Fibromyalgia/Arthritis  GERD  BMI 58.5  Migraine    Plan:  Continue, OT/PT, mobilize, wound care, IS, antibiotics, social worker is also seeing her.  She needs psychiatric help, but I'm not sure how much they can offer her as in IP.  Continue Full liquids for now.  LOS: 5 days    Dawson Albers 04/09/2012

## 2012-04-09 NOTE — Progress Notes (Signed)
Dr. Robb Matar calls back states to call [facility] he is only consulted, at this time another of urine output recorded which is approx 41ml/hr today discussed with [facility] and will report to night shift to continue to monitor.
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
## 2012-04-10 DIAGNOSIS — I10 Essential (primary) hypertension: Secondary | ICD-10-CM

## 2012-04-10 LAB — CBC
HCT: 34.7 % — ABNORMAL LOW (ref 36.0–46.0)
MCH: 26.5 pg (ref 26.0–34.0)
MCV: 84.2 fL (ref 78.0–100.0)
Platelets: 655 10*3/uL — ABNORMAL HIGH (ref 150–400)
RBC: 4.12 MIL/uL (ref 3.87–5.11)

## 2012-04-10 LAB — BASIC METABOLIC PANEL
CO2: 30 mEq/L (ref 19–32)
Calcium: 8.4 mg/dL (ref 8.4–10.5)
Chloride: 100 mEq/L (ref 96–112)
Creatinine, Ser: 0.79 mg/dL (ref 0.50–1.10)
Glucose, Bld: 135 mg/dL — ABNORMAL HIGH (ref 70–99)
Sodium: 137 mEq/L (ref 135–145)

## 2012-04-10 MED ORDER — PIPERACILLIN-TAZOBACTAM 3.375 G IVPB
3.3750 g | Freq: Three times a day (TID) | INTRAVENOUS | Status: DC
  Administered 2012-04-10 – 2012-04-14 (×12): 3.375 g via INTRAVENOUS
  Filled 2012-04-10 (×13): qty 50

## 2012-04-10 NOTE — Progress Notes (Signed)
Patient ID: Jasmine Huff, female   DOB: 1956/05/22, 56 y.o.   MRN: [id number]  General Surgery - [facility] - Progress Note  POD# 6  Subjective: Patient in bed and immobile secondary to morbid obesity and not willing to cooperate with efforts to ambulate, get up to chair.  Taking full liquid diet.  Complains of pain.  Objective: Vital signs in last 24 hours: Temp:  [97.9 F (36.6 C)-98.9 F (37.2 C)] 98.9 F (37.2 C) (10/05 0635) Pulse Rate:  [79-92] 92  (10/05 0635) Resp:  [18-20] 20  (10/05 0635) BP: (127-147)/(61-85) 137/71 mmHg (10/05 0635) SpO2:  [93 %-96 %] 94 % (10/05 0635) Last BM Date: 04/08/12  Intake/Output from previous day: 10/04 0701 - 10/05 0700 In: 2658 [P.O.:900; I.V.:958; IV Piggyback:800] Out: 800 [Urine:650; Drains:50; Stool:100]  Exam: HEENT - clear, not icteric Neck - soft Chest - clear bilaterally, shallow breaths Cor - RRR, no murmur Abd - morbidly obese, stoma viable with small output; small red robinson rectal tube removed; midline wound with VAC intact Ext - no significant edema Neuro - grossly intact, no focal deficits  Lab Results:   Basename 04/10/12 0418 04/09/12 0455  WBC 27.1* 19.5*  HGB 10.9* 10.3*  HCT 34.7* 32.3*  PLT 655* 599*     Basename 04/10/12 0418 04/09/12 0455  NA 137 139  K 4.0 3.3*  CL 100 100  CO2 30 28  GLUCOSE 135* 146*  BUN 14 17  CREATININE 0.79 0.77  CALCIUM 8.4 8.2*    Studies/Results: No results found.  Assessment / Plan: 1.  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 Office: [phone]  04/10/2012

## 2012-04-10 NOTE — Consult Note (Signed)
[facility] CONSULT FOLLOW-UP NOTE  Jasmine Huff ZOX:[id number] DOB: [birth year] DOA: 04/04/2012 PCP: [name], MD Attending service: General surgery  Impression/Recommendations: 1. 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 S/p sepsis secondary to above--per surgery 3. S/p acute renal failure--resolved, secondary to sepsis. 4. DM type 2--stable, continue SSI. Not on any medications as outpatient. Hgb A1c 6.2. 5. HTN--controlled. HCTZ held when septic. Observe for now. Can restart HCTZ if BP increases. 6. Fibromyalgia/chronic pain syndrome--continue MS Contin, PRN medications. 7. Depression--deferred to psychiatry 8. Morbid obesity  Brendia Sacks, MD  [facility] Team 6 Pager [phone]. If 8PM-8AM, please contact night-coverage at [email or web address], password [facility] 04/10/2012, 3:22 PM  LOS: 6 days   HPI/Subjective: Complains of poor appetite, present for months.  Objective: Filed Vitals:   04/09/12 1400 04/09/12 2031 04/10/12 0635 04/10/12 1400  BP: 130/85 127/61 137/71 133/84  Pulse: 79 89 92 89  Temp: 98.2 F (36.8 C) 98.1 F (36.7 C) 98.9 F (37.2 C) 98.8 F (37.1 C)  TempSrc: Oral Oral Oral Oral  Resp: 18 20 20 16   Height:      Weight:      SpO2: 95% 93% 94% 93%    Intake/Output Summary (Last 24 hours) at 04/10/12 1522 Last data filed at 04/10/12 1400  Gross per 24 hour  Intake   2438 ml  Output    675 ml  Net   1763 ml    Exam:  General:  Appears comfortable, mildly anxious Cardiovascular: RRR, no m/r/g. No LE edema. Respiratory: CTA bilaterally, no w/r/r. Normal respiratory effort. Psychiatric: appears anxious  Data Reviewed: Basic Metabolic Panel:  Lab 04/10/12 8295 04/09/12 0455 04/07/12 0350 04/06/12 0405 04/05/12 1508 04/05/12 0210 04/04/12 2220  NA 137 139 136 133* 134* -- --  K 4.0 3.3* 3.9 3.8 3.9 -- --  CL 100 100 97 96 97 -- --  CO2 30 28 27 29 28  -- --  GLUCOSE 135* 146*  104* 129* 156* -- --  BUN 14 17 36* 39* 32* -- --  CREATININE 0.79 0.77 1.19* 1.89* 2.03* -- --  CALCIUM 8.4 8.2* 8.3* 7.9* 7.6* -- --  MG -- -- -- -- -- 1.6 1.4*  PHOS -- -- -- -- -- 4.1 3.1   Liver Function Tests:  Lab 04/09/12 0455 04/07/12 0350 04/04/12 1206  AST 19 19 14   ALT 14 12 26   ALKPHOS 84 107 216*  BILITOT 0.2* 0.3 0.6  PROT 5.8* 5.4* 7.2  ALBUMIN 1.7* 1.6* 2.7*   CBC:  Lab 04/10/12 0418 04/09/12 0455 04/07/12 0350 04/06/12 1415 04/06/12 0405 04/04/12 1206  WBC 27.1* 19.5* 19.6* 20.6* 19.5* --  NEUTROABS -- -- -- 18.0* 17.1* 17.1*  HGB 10.9* 10.3* 9.9* 9.9* 10.0* --  HCT 34.7* 32.3* 30.6* 31.1* 30.9* --  MCV 84.2 83.0 82.5 82.7 82.4 --  PLT 655* 599* 503* 492* 484* --    Basename 04/07/12 0350 02/23/12 1323  PROBNP 260.8* 1049.0*   CBG:  Lab 04/10/12 1150 04/10/12 0756 04/09/12 2025 04/09/12 1656 04/09/12 1211  GLUCAP 135* 120* 131* 127* 152*    Recent Results (from the past 240 hour(s))  MRSA PCR SCREENING     Status: Normal   Collection Time   04/04/12 10:13 PM      Component Value Range Status Comment   MRSA by PCR NEGATIVE  NEGATIVE Final   CULTURE, BLOOD (ROUTINE X 2)     Status: Normal (Preliminary result)   Collection Time  04/05/12  1:39 AM      Component Value Range Status Comment   Specimen Description Blood   Final    Special Requests Normal   Final    Culture  Setup Time 04/05/2012 17:41   Final    Culture     Final    Value:        BLOOD CULTURE RECEIVED NO GROWTH TO DATE CULTURE WILL BE HELD FOR 5 DAYS BEFORE ISSUING A FINAL NEGATIVE REPORT   Report Status PENDING   Incomplete   CULTURE, BLOOD (ROUTINE X 2)     Status: Normal (Preliminary result)   Collection Time   04/05/12  1:39 AM      Component Value Range Status Comment   Specimen Description Blood   Final    Special Requests Normal   Final    Culture  Setup Time 04/05/2012 17:41   Final    Culture     Final    Value:        BLOOD CULTURE RECEIVED NO GROWTH TO DATE CULTURE  WILL BE HELD FOR 5 DAYS BEFORE ISSUING A FINAL NEGATIVE REPORT   Report Status PENDING   Incomplete   URINE CULTURE     Status: Normal   Collection Time   04/06/12  9:33 AM      Component Value Range Status Comment   Specimen Description URINE, CATHETERIZED   Final    Special Requests NONE   Final    Culture  Setup Time 04/07/2012 01:00   Final    Colony Count NO GROWTH   Final    Culture NO GROWTH   Final    Report Status 04/07/2012 FINAL   Final      Studies: No results found.  Scheduled Meds:   . ARIPiprazole  5 mg Oral Daily  . escitalopram  30 mg Oral Daily  . heparin  5,000 Units Subcutaneous Q8H  . hydroxychloroquine  200 mg Oral Daily  . insulin aspart  0-5 Units Subcutaneous QHS  . insulin aspart  0-9 Units Subcutaneous TID WC  . micafungin (MYCAMINE) IV  100 mg Intravenous Q24H  . morphine  30 mg Oral Q12H  . pantoprazole (PROTONIX) IV  40 mg Intravenous QHS  . piperacillin-tazobactam (ZOSYN)  IV  3.375 g Intravenous Q8H  . potassium chloride  10 mEq Intravenous Q1 Hr x 2  . DISCONTD: ertapenem (INVANZ) IV  1 g Intravenous Q24H  . DISCONTD: vancomycin  1,250 mg Intravenous Q12H   Continuous Infusions:   . sodium chloride 20 mL/hr (04/09/12 2010)     Principal Problem:  *Perforation of colon Active Problems:  ARF (acute renal failure)  Respiratory failure, post-operative  Severe sepsis    Time Spent: 20 minutes  Brendia Sacks, MD  [facility] Team 6 Pager [phone]. If 8PM-8AM, please contact night-coverage at [email or web address], password [facility] 04/10/2012, 3:22 PM  LOS: 6 days

## 2012-04-11 LAB — GLUCOSE, CAPILLARY
Glucose-Capillary: 158 mg/dL — ABNORMAL HIGH (ref 70–99)
Glucose-Capillary: 159 mg/dL — ABNORMAL HIGH (ref 70–99)

## 2012-04-11 LAB — BASIC METABOLIC PANEL
BUN: 13 mg/dL (ref 6–23)
Calcium: 8.1 mg/dL — ABNORMAL LOW (ref 8.4–10.5)
Creatinine, Ser: 0.85 mg/dL (ref 0.50–1.10)
GFR calc non Af Amer: 75 mL/min — ABNORMAL LOW (ref >90)
Glucose, Bld: 128 mg/dL — ABNORMAL HIGH (ref 70–99)
Potassium: 3.7 mEq/L (ref 3.5–5.1)

## 2012-04-11 LAB — CBC
HCT: 32.5 % — ABNORMAL LOW (ref 36.0–46.0)
Hemoglobin: 10 g/dL — ABNORMAL LOW (ref 12.0–15.0)
MCH: 26 pg (ref 26.0–34.0)
MCHC: 30.8 g/dL (ref 30.0–36.0)
MCV: 84.6 fL (ref 78.0–100.0)

## 2012-04-11 LAB — CULTURE, BLOOD (ROUTINE X 2)
Culture: NO GROWTH
Special Requests: NORMAL

## 2012-04-11 NOTE — Progress Notes (Signed)
Output low pt states feels need to void-attempted to irrigate catheter with dr gross permission. Unable. Foley removed and new catheter inserted with 200cc return

## 2012-04-11 NOTE — Progress Notes (Signed)
Dr Gerrit Friends notified pt urinary output 40cc in 4 hours. Instructed to encourage pt to drink more.

## 2012-04-11 NOTE — Consult Note (Signed)
[facility] CONSULT FOLLOW-UP NOTE  Jasmine Huff JYN:[id number] DOB: 1956-01-23 DOA: 04/04/2012 PCP: [name], MD Attending service: General surgery  Impression/Recommendations: 1. 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 S/p sepsis secondary to above--per surgery 3. S/p acute renal failure--resolved, secondary to sepsis. 4. DM type 2--stable, continue SSI. Not on any medications as outpatient. Hgb A1c 6.2. Continue diet-control on discharge, follow-up with PCP 1-2 weeks post discharge. 5. HTN--controlled. HCTZ held when septic. Observe for now. Can restart HCTZ if BP increases, otherwise hold until follow-up with PCP (can cause hyperglycemia) 6. Fibromyalgia/chronic pain syndrome--continue MS Contin, PRN medications. 7. Depression--deferred to psychiatry 8. Morbid obesity  Diabetes, HTN, chronic medical issues appear stable. I will sign off, but please call me through 10/8 if I can be of further assistance.  Brendia Sacks, MD  [facility] Team 6 Pager [phone]. If 8PM-8AM, please contact night-coverage at [email or web address], password [facility] 04/11/2012, 6:25 PM  LOS: 7 days   HPI/Subjective: Feels better, eating.  Objective: Filed Vitals:   04/10/12 0635 04/10/12 1400 04/11/12 0525 04/11/12 1400  BP: 137/71 133/84 125/80 104/64  Pulse: 92 89 88 80  Temp: 98.9 F (37.2 C) 98.8 F (37.1 C) 98 F (36.7 C) 98.1 F (36.7 C)  TempSrc: Oral Oral Oral Oral  Resp: 20 16 18 14   Height:      Weight:      SpO2: 94% 93% 90% 93%    Intake/Output Summary (Last 24 hours) at 04/11/12 1825 Last data filed at 04/11/12 1756  Gross per 24 hour  Intake 788.67 ml  Output    650 ml  Net 138.67 ml    Exam:  General:  Appears comfortable and calm Cardiovascular: RRR, no m/r/g.  Respiratory: CTA bilaterally, no w/r/r. Normal respiratory effort.  Data Reviewed: Basic Metabolic Panel:  Lab 04/11/12 9629 04/10/12 0418  04/09/12 0455 04/07/12 0350 04/06/12 0405 04/05/12 0210 04/04/12 2220  NA 137 137 139 136 133* -- --  K 3.7 4.0 3.3* 3.9 3.8 -- --  CL 99 100 100 97 96 -- --  CO2 27 30 28 27 29  -- --  GLUCOSE 128* 135* 146* 104* 129* -- --  BUN 13 14 17  36* 39* -- --  CREATININE 0.85 0.79 0.77 1.19* 1.89* -- --  CALCIUM 8.1* 8.4 8.2* 8.3* 7.9* -- --  MG -- -- -- -- -- 1.6 1.4*  PHOS -- -- -- -- -- 4.1 3.1   Liver Function Tests:  Lab 04/09/12 0455 04/07/12 0350  AST 19 19  ALT 14 12  ALKPHOS 84 107  BILITOT 0.2* 0.3  PROT 5.8* 5.4*  ALBUMIN 1.7* 1.6*   CBC:  Lab 04/11/12 0410 04/10/12 0418 04/09/12 0455 04/07/12 0350 04/06/12 1415 04/06/12 0405  WBC 25.2* 27.1* 19.5* 19.6* 20.6* --  NEUTROABS -- -- -- -- 18.0* 17.1*  HGB 10.0* 10.9* 10.3* 9.9* 9.9* --  HCT 32.5* 34.7* 32.3* 30.6* 31.1* --  MCV 84.6 84.2 83.0 82.5 82.7 --  PLT 488* 655* 599* 503* 492* --    Basename 04/07/12 0350 02/23/12 1323  PROBNP 260.8* 1049.0*   CBG:  Lab 04/11/12 1653 04/11/12 1223 04/11/12 0802 04/10/12 2218 04/10/12 1656  GLUCAP 144* 158* 134* 125* 129*    Recent Results (from the past 240 hour(s))  MRSA PCR SCREENING     Status: Normal   Collection Time   04/04/12 10:13 PM      Component Value Range Status Comment   MRSA by PCR NEGATIVE  NEGATIVE Final   CULTURE, BLOOD (ROUTINE X 2)     Status: Normal   Collection Time   04/05/12  1:39 AM      Component Value Range Status Comment   Specimen Description Blood   Final    Special Requests Normal   Final    Culture  Setup Time 04/05/2012 17:41   Final    Culture NO GROWTH 5 DAYS   Final    Report Status 04/11/2012 FINAL   Final   CULTURE, BLOOD (ROUTINE X 2)     Status: Normal   Collection Time   04/05/12  1:39 AM      Component Value Range Status Comment   Specimen Description Blood   Final    Special Requests Normal   Final    Culture  Setup Time 04/05/2012 17:41   Final    Culture NO GROWTH 5 DAYS   Final    Report Status 04/11/2012 FINAL   Final    URINE CULTURE     Status: Normal   Collection Time   04/06/12  9:33 AM      Component Value Range Status Comment   Specimen Description URINE, CATHETERIZED   Final    Special Requests NONE   Final    Culture  Setup Time 04/07/2012 01:00   Final    Colony Count NO GROWTH   Final    Culture NO GROWTH   Final    Report Status 04/07/2012 FINAL   Final      Studies: No results found.  Scheduled Meds:    . ARIPiprazole  5 mg Oral Daily  . escitalopram  30 mg Oral Daily  . heparin  5,000 Units Subcutaneous Q8H  . hydroxychloroquine  200 mg Oral Daily  . insulin aspart  0-5 Units Subcutaneous QHS  . insulin aspart  0-9 Units Subcutaneous TID WC  . micafungin (MYCAMINE) IV  100 mg Intravenous Q24H  . morphine  30 mg Oral Q12H  . pantoprazole (PROTONIX) IV  40 mg Intravenous QHS  . piperacillin-tazobactam (ZOSYN)  IV  3.375 g Intravenous Q8H   Continuous Infusions:    . sodium chloride 20 mL/hr (04/09/12 2010)     Principal Problem:  *Perforation of colon Active Problems:  Hypertension  Fibromyalgia  Depression  ARF (acute renal failure)  Diabetes mellitus  Respiratory failure, post-operative  Severe sepsis(995.92)    Time Spent: 10 minutes  Brendia Sacks, MD  [facility] Team 6 Pager [phone]. If 8PM-8AM, please contact night-coverage at [email or web address], password [facility] 04/11/2012, 6:25 PM  LOS: 7 days

## 2012-04-11 NOTE — Progress Notes (Signed)
Patient ID: Jasmine Huff, female   DOB: Jun 29, 1956, 56 y.o.   MRN: [id number]  General Surgery - [facility] - Progress Note  POD# 7  Subjective: Patient up in chair this AM.  Much brighter and happier.  Tolerating full liquid diet.  No output from colostomy yet.  Objective: Vital signs in last 24 hours: Temp:  [98 F (36.7 C)-98.8 F (37.1 C)] 98 F (36.7 C) (10/06 0525) Pulse Rate:  [88-89] 88  (10/06 0525) Resp:  [16-18] 18  (10/06 0525) BP: (125-133)/(80-84) 125/80 mmHg (10/06 0525) SpO2:  [90 %-93 %] 90 % (10/06 0525) Last BM Date: 04/08/12  Intake/Output from previous day: 10/05 0701 - 10/06 0700 In: 770 [P.O.:480; I.V.:240; IV Piggyback:50] Out: 631 [Urine:625; Emesis/NG output:1; Drains:5]  Exam: HEENT - clear, not icteric Neck - soft Chest - clear bilaterally Cor - RRR, no murmur Abd - soft, obese; active BS; VAC intact; stoma viable, no output in bag Ext - no significant edema Neuro - grossly intact, no focal deficits  Lab Results:   Basename 04/11/12 0410 04/10/12 0418  WBC 25.2* 27.1*  HGB 10.0* 10.9*  HCT 32.5* 34.7*  PLT 488* 655*     Basename 04/11/12 0410 04/10/12 0418  NA 137 137  K 3.7 4.0  CL 99 100  CO2 27 30  GLUCOSE 128* 135*  BUN 13 14  CREATININE 0.85 0.79  CALCIUM 8.1* 8.4    Studies/Results: No results found.  Assessment / Plan: 1.  Status post Hartmann's resection for perforation  - advance to regular diet  - OOB, ambulate  - VAC to wound  - ostomy care  - continue IV Zosyn and monitor WBC daily  Velora Heckler, MD, [facility] Office: [phone]  04/11/2012

## 2012-04-12 LAB — CBC
Hemoglobin: 9.7 g/dL — ABNORMAL LOW (ref 12.0–15.0)
MCH: 26.9 pg (ref 26.0–34.0)
RBC: 3.6 MIL/uL — ABNORMAL LOW (ref 3.87–5.11)

## 2012-04-12 LAB — GLUCOSE, CAPILLARY
Glucose-Capillary: 122 mg/dL — ABNORMAL HIGH (ref 70–99)
Glucose-Capillary: 145 mg/dL — ABNORMAL HIGH (ref 70–99)
Glucose-Capillary: 146 mg/dL — ABNORMAL HIGH (ref 70–99)

## 2012-04-12 MED ORDER — PANTOPRAZOLE SODIUM 40 MG PO TBEC
40.0000 mg | DELAYED_RELEASE_TABLET | Freq: Every day | ORAL | Status: DC
  Administered 2012-04-12 – 2012-04-15 (×4): 40 mg via ORAL
  Filled 2012-04-12 (×5): qty 1

## 2012-04-12 MED ORDER — HYDROXYCHLOROQUINE SULFATE 200 MG PO TABS
200.0000 mg | ORAL_TABLET | Freq: Two times a day (BID) | ORAL | Status: DC
  Administered 2012-04-12 – 2012-04-16 (×8): 200 mg via ORAL
  Filled 2012-04-12 (×9): qty 1

## 2012-04-12 NOTE — Progress Notes (Signed)
This patient is receiving IV Protonix. Based on criteria approved by the Pharmacy and Therapeutics Committee, this medication is being converted to the equivalent oral dose form. These criteria include:   . The patient is eating (either orally or per tube) and/or has been taking other orally administered medications for at least 24 hours.  . This patient has no evidence of active gastrointestinal bleeding or impaired GI absorption (gastrectomy, short bowel, patient on TNA or NPO).   If you have questions about this conversion, please contact the pharmacy department.  Reece Packer, [facility] 04/12/2012 2:06 PM

## 2012-04-12 NOTE — Progress Notes (Signed)
Occupational Therapy Treatment Patient Details Name: Jasmine Huff MRN: [id number] DOB: Aug 09, 1955 Today's Date: 04/12/2012 Time: 4098-1191 OT Time Calculation (min): 19 min  OT Assessment / Plan / Recommendation Comments on Treatment Session Pt with increased I today with adl transfers.  Now does not need transfer equipment or +2 assist.    Follow Up Recommendations  Skilled nursing facility    Barriers to Discharge       Equipment Recommendations  Rolling walker with 5" wheels;3 in 1 bedside comode    Recommendations for Other Services    Frequency Min 2X/week   Plan Discharge plan remains appropriate    Precautions / Restrictions Precautions Precautions: Fall Precaution Comments: abdominal surgery, drains Restrictions Weight Bearing Restrictions: No   Pertinent Vitals/Pain Pt with 4/5 pain to start session and 5/5 pain to finish.  Nursing aware.  Pt repositioned for comfort.    ADL  Toilet Transfer: Performed;Minimal assistance Toilet Transfer Method: Surveyor, minerals: Materials engineer and Hygiene: Performed;Minimal assistance Where Assessed - Engineer, mining and Hygiene: Sit to stand from 3-in-1 or toilet Transfers/Ambulation Related to ADLs: Pt transfered with easy +1 assist today.  Pt very particular about what she did and did not want to do in therapy today. ADL Comments: Pt with increased motivation today to get OOB.  Pt able to stand with very little help but not up to doing much more than a few transfers.  Improvement noted.    OT Diagnosis:    OT Problem List:   OT Treatment Interventions:     OT Goals Acute Rehab OT Goals OT Goal Formulation: With patient Time For Goal Achievement: 04/22/12 Potential to Achieve Goals: Good ADL Goals Pt Will Perform Grooming: with set-up;Sitting, edge of bed;Sitting, chair;Unsupported Pt Will Transfer to Toilet: with mod assist;Extra wide 3-in-1;Squat  pivot transfer;Stand pivot transfer ADL Goal: Toilet Transfer - Progress: Met Pt Will Perform Toileting - Clothing Manipulation: with max assist;Sitting on 3-in-1 or toilet;Standing ADL Goal: Toileting - Clothing Manipulation - Progress: Met Pt Will Perform Toileting - Hygiene: with max assist;Sit to stand from 3-in-1/toilet ADL Goal: Toileting - Hygiene - Progress: Met Additional ADL Goal #1: Pt will complete supine to sit with min A in prep for seated ADL  ADL Goal: Additional Goal #1 - Progress: Met Additional ADL Goal #2: Pt will tolerate 20 min of TA with minimal RBs in prep for ADLs. Arm Goals Pt Will Complete Theraband Exer: with supervision, verbal cues required/provided;Bilateral upper extremities;to increase strength  Visit Information  Last OT Received On: 04/12/12 Assistance Needed: +1 PT/OT Co-Evaluation/Treatment: Yes    Subjective Data      Prior Functioning       Cognition  Overall Cognitive Status: Appears within functional limits for tasks assessed/performed Arousal/Alertness: Awake/alert Orientation Level: Appears intact for tasks assessed Behavior During Session: Anxious Cognition - Other Comments: Pt not tearful during session today but did direct her care being very specific about what she did and did not want to do.    Mobility  Shoulder Instructions Bed Mobility Bed Mobility: Rolling Right;Right Sidelying to Sit Rolling Right: 4: Min guard Right Sidelying to Sit: 4: Min assist;HOB elevated;With rails Details for Bed Mobility Assistance: Pt with much greater independence today. Transfers Transfers: Sit to Stand;Stand to Sit Sit to Stand: 4: Min guard;From bed;With upper extremity assist Stand to Sit: 4: Min guard;With upper extremity assist;To chair/3-in-1 Details for Transfer Assistance: Pt required cues to remember to reach back to sit  for safety.       Exercises      Balance     End of Session OT - End of Session Activity Tolerance: Patient  limited by pain;Patient limited by fatigue Patient left: in chair;with call bell/phone within reach  GO     Hope Budds 04/12/2012, 11:39 AM [phone]

## 2012-04-12 NOTE — Consult Note (Addendum)
WOC Wound consult  Reason for Consult:VAC dressing change Wound type:surgical Pressure Ulcer POA: No Wound bed: Clean, pink, moist Drainage (amount, consistency, odor) Moderate amount of sero-sanguinous drainage in VAC container.  No odor. 1 piece black foam used and drape used to approximate wound edges.  tolerated well when seal achieved. Patient with intertriginous (pannus) skin breakdown.  I will discontinue the topical antifungal ointment in favor of our house silver impregnated textile used for this purpose ([employer]).  Additionally, we will change her bariatric bed to a bariatric bed with low air loss to assist in moisture management. Next VAC change due on Wednesday, 04/14/12.   WOC ostomy consult  Stoma type/location: LUQ Colostomy Stomal assessment/size: 1 and 5/8 inch round, red, moist, edematous Peristomal assessment: intact Treatment options for stomal/peristomal skin: none indeicated Output none; scant amount serous exudate in pouch and leaking at 9 o'clock Ostomy pouching: 1pc. [facility] [place] [phone]) with barrier ring.  Education provided: Patient has closed her eyes during last two VAC/ostomy pouch changes.  Was recently medicated for pain and was requesting to be left alone for an hour to rest following dressing/pouch change session. Patient established with [facility] today (post discharge ostomy sampling program) with her permission.  Our team will continue to follow with you. Thanks, Ladona Mow, MSN, RN, [facility], CWOCN [phone])

## 2012-04-12 NOTE — Progress Notes (Signed)
Physical Therapy Treatment Patient Details Name: Jasmine Huff MRN: [id number] DOB: 1956/05/08 Today's Date: 04/12/2012 Time: 0865-7846 PT Time Calculation (min): 19 min  PT Assessment / Plan / Recommendation Comments on Treatment Session  Pt assisted to [facility] and then recliner.  Pt moving much better today and more independently.   Pt declined ambulation (reports too soon, maybe next visit) as well as exercises.    Follow Up Recommendations  Post acute inpatient     Does the patient have the potential to tolerate intense rehabilitation  No, Recommend SNF  Barriers to Discharge        Equipment Recommendations  Rolling walker with 5" wheels;3 in 1 bedside comode    Recommendations for Other Services    Frequency     Plan Discharge plan remains appropriate;Frequency remains appropriate    Precautions / Restrictions Precautions Precautions: Fall Precaution Comments: abdominal surgery, drains  Restrictions Weight Bearing Restrictions: No   Pertinent Vitals/Pain 4-5/10 abdominal pain after transfers, repositioned in recliner    Mobility  Bed Mobility Bed Mobility: Rolling Right;Right Sidelying to Sit Rolling Right: 4: Min guard Right Sidelying to Sit: 4: Min assist;HOB elevated;With rails Details for Bed Mobility Assistance: pt able to assist more today, moving better and more independently Transfers Transfers: Stand to Sit;Sit to Stand;Stand Pivot Transfers Sit to Stand: 4: Min guard;From bed;With upper extremity assist;From chair/3-in-1 Stand to Sit: 4: Min guard;With upper extremity assist;To chair/3-in-1 Stand Pivot Transfers: 4: Min guard Details for Transfer Assistance: verbal cue to reach back for chair upon sitting.  stand pivot bed <-> BSC then bed <-> recliner, did reach for armrests with transfers but declined RW Ambulation/Gait Ambulation/Gait Assistance: Not tested (comment)    Exercises     PT Diagnosis:    PT Problem List:   PT Treatment Interventions:      PT Goals Acute Rehab PT Goals PT Goal: Rolling Supine to Right Side - Progress: Progressing toward goal PT Goal: Supine/Side to Sit - Progress: Progressing toward goal Pt will go Sit to Stand: with supervision PT Goal: Sit to Stand - Progress: Updated due to goal met Pt will go Stand to Sit: with supervision PT Goal: Stand to Sit - Progress: Updated due to goals met Pt will Transfer Bed to Chair/Chair to Bed: with supervision PT Transfer Goal: Bed to Chair/Chair to Bed - Progress: Updated due to goal met  Visit Information  Last PT Received On: 04/12/12 Assistance Needed: +1    Subjective Data  Subjective: I can stand today but I'm not ready to walk yet.   Cognition  Overall Cognitive Status: Appears within functional limits for tasks assessed/performed Arousal/Alertness: Awake/alert Orientation Level: Appears intact for tasks assessed Behavior During Session: Anxious Cognition - Other Comments: Pt not tearful during session today but did direct her care being very specific about what she did and did not want to do.    Balance     End of Session PT - End of Session Activity Tolerance: Patient limited by fatigue Patient left: in chair;with call bell/phone within reach Nurse Communication: Mobility status   GP     Romey Mathieson,KATHrine E 04/12/2012, 1:32 PM Pager: [phone]

## 2012-04-12 NOTE — Progress Notes (Signed)
Patient ID: Jasmine Huff, female   DOB: [birth year], 56 y.o.   MRN: [id number] 8 Days Post-Op  Subjective: Pt is sleepy and wants to be left alone.  She has had no output in her ostomy pouch yet.  She is on solid diet, but had a little nausea last night.  Objective: Vital signs in last 24 hours: Temp:  [98 F (36.7 C)-98.2 F (36.8 C)] 98.2 F (36.8 C) (10/07 0643) Pulse Rate:  [80-91] 88  (10/07 0643) Resp:  [14-18] 18  (10/07 0643) BP: (104-155)/(64-94) 155/94 mmHg (10/07 0643) SpO2:  [91 %-95 %] 95 % (10/07 0643) Weight:  [297 lb 8 oz (134.945 kg)] 297 lb 8 oz (134.945 kg) (10/07 0234) Last BM Date: 04/08/12 (colostomy to rt side with bloody draiange noted)  Intake/Output from previous day: 10/06 0701 - 10/07 0700 In: 1301.7 [P.O.:200; I.V.:701.7; IV Piggyback:400] Out: 1055 [Urine:1040; Drains:15] Intake/Output this shift:    PE: Abd: soft, obese, few BS, only air in ostomy bag.  Some serosang material has caused leakage of her ostomy pouch.  Wound VAC in place.  JP drain not visualized as it is between her legs and the patient just wants to sleep so she did not move so I could look at this.  Lab Results:   Basename 04/12/12 0315 04/11/12 0410  WBC 22.1* 25.2*  HGB 9.7* 10.0*  HCT 30.2* 32.5*  PLT 555* 488*   BMET  Basename 04/11/12 0410 04/10/12 0418  NA 137 137  K 3.7 4.0  CL 99 100  CO2 27 30  GLUCOSE 128* 135*  BUN 13 14  CREATININE 0.85 0.79  CALCIUM 8.1* 8.4   PT/INR No results found for this basename: LABPROT:2,INR:2 in the last 72 hours CMP     Component Value Date/Time   NA 137 04/11/2012 0410   K 3.7 04/11/2012 0410   CL 99 04/11/2012 0410   CO2 27 04/11/2012 0410   GLUCOSE 128* 04/11/2012 0410   BUN 13 04/11/2012 0410   CREATININE 0.85 04/11/2012 0410   CREATININE 0.79 10/21/2011 0925   CALCIUM 8.1* 04/11/2012 0410   PROT 5.8* 04/09/2012 0455   ALBUMIN 1.7* 04/09/2012 0455   AST 19 04/09/2012 0455   ALT 14 04/09/2012 0455   ALKPHOS 84 04/09/2012 0455   BILITOT 0.2* 04/09/2012 0455   GFRNONAA 75* 04/11/2012 0410   GFRAA 87* 04/11/2012 0410   Lipase  No results found for this basename: lipase       Studies/Results: No results found.  Anti-infectives: Anti-infectives     Start     Dose/Rate Route Frequency Ordered Stop   04/10/12 1000  piperacillin-tazobactam (ZOSYN) IVPB 3.375 g       3.375 g 12.5 mL/hr over 240 Minutes Intravenous 3 times per day 04/10/12 0740     04/09/12 1200   vancomycin (VANCOCIN) 1,250 mg in sodium chloride 0.9 % 250 mL IVPB  Status:  Discontinued        1,250 mg 166.7 mL/hr over 90 Minutes Intravenous Every 12 hours 04/09/12 1050 04/10/12 0740   04/07/12 1830   hydroxychloroquine (PLAQUENIL) tablet 200 mg        200 mg Oral Daily 04/07/12 1757     04/06/12 1600   vancomycin (VANCOCIN) 1,750 mg in sodium chloride 0.9 % 500 mL IVPB  Status:  Discontinued        1,750 mg 250 mL/hr over 120 Minutes Intravenous Every 24 hours 04/06/12 1350 04/09/12 1050   04/05/12 1600   ertapenem (INVANZ)  1 g in sodium chloride 0.9 % 50 mL IVPB  Status:  Discontinued        1 g 100 mL/hr over 30 Minutes Intravenous Every 24 hours 04/04/12 2125 04/10/12 0740   04/04/12 2300   micafungin (MYCAMINE) 100 mg in sodium chloride 0.9 % 100 mL IVPB        100 mg 100 mL/hr over 1 Hours Intravenous Every 24 hours 04/04/12 2230     04/04/12 1945   clindamycin (CLEOCIN) 900 mg, gentamicin (GARAMYCIN) 240 mg in sodium chloride 0.9 % 1,000 mL for intraperitoneal lavage         Intraperitoneal To Surgery 04/04/12 1931 04/04/12 2004   04/04/12 1530   ertapenem (INVANZ) 1 g in sodium chloride 0.9 % 50 mL IVPB        1 g 100 mL/hr over 30 Minutes Intravenous  Once 04/04/12 1516 04/04/12 1641           Assessment/Plan  1. S/p Hartman's for colonic perforation - stercoral perf  A.Thomas - 04/04/2012 2.  HTN 3. ARF, resolved 4. Leukocytosis, persistent, but trending down slowly 5. Sepsis, resolving 6. Depression 7. Morbid  obesity. 8.  DVT proph   SQ heparin  Plan:  1. Continue IV abx therapy.  WBC trending down some, but still odd for WBC to be 22K on POD# 8, despite IV abx therapy. 2. Check labs in the morning 3. Appreciate medicine assistance with medical problems 4. Cont mobilization and pulm toilet 5. Follow ostomy output as she still has not had any stool in bag yet  LOS: 8 days    OSBORNE,KELLY E 04/12/2012, 9:27 AM Pager: [phone]  Happy with solid food.  I reiterated that the patient needs to ambulate.  She said that she will walk the hall tomorrow.  Seems to be progressing. Ovidio Kin, MD, [facility] Pager: [phone] Office phone:  [phone]

## 2012-04-13 LAB — CBC
MCH: 26.1 pg (ref 26.0–34.0)
MCHC: 31 g/dL (ref 30.0–36.0)
Platelets: 538 10*3/uL — ABNORMAL HIGH (ref 150–400)
RBC: 3.48 MIL/uL — ABNORMAL LOW (ref 3.87–5.11)
RDW: 16.3 % — ABNORMAL HIGH (ref 11.5–15.5)

## 2012-04-13 MED ORDER — POLYETHYLENE GLYCOL 3350 17 G PO PACK
17.0000 g | PACK | Freq: Every day | ORAL | Status: DC
  Administered 2012-04-13 – 2012-04-16 (×4): 17 g via ORAL
  Filled 2012-04-13 (×4): qty 1

## 2012-04-13 MED ORDER — DOCUSATE SODIUM 100 MG PO CAPS
100.0000 mg | ORAL_CAPSULE | Freq: Two times a day (BID) | ORAL | Status: DC
  Administered 2012-04-13 – 2012-04-16 (×6): 100 mg via ORAL
  Filled 2012-04-13 (×7): qty 1

## 2012-04-13 NOTE — Progress Notes (Signed)
Physical Therapy Treatment Patient Details Name: Jasmine Huff: [id number] DOB: 12-03-1955 Today's Date: 04/13/2012 Time: 4742-5956 PT Time Calculation (min): 30 min  PT Assessment / Plan / Recommendation Comments on Treatment Session  Max encouragement for OOB/ambulation this session. Pt anxious, fearful, tearful duirng session. Had incident last night-slid out of bari bed when trying to exit. Able to get pt to ambulate in room and to sit up in recliner. Explained to pt that she has to call nursing for assistance back to bed (when she's ready/after two hours)-therapy will not be back today. Recommend SNF for continued rehab.     Follow Up Recommendations  Post acute inpatient-SNF     Does the patient have the potential to tolerate intense rehabilitation  No, Recommend SNF  Barriers to Discharge        Equipment Recommendations  Rolling walker with 5" wheels;3 in 1 bedside comode (wide)    Recommendations for Other Services    Frequency Min 3X/week   Plan Discharge plan remains appropriate    Precautions / Restrictions Precautions Precautions: Fall Precaution Comments: abdominal surgery, drains Restrictions Weight Bearing Restrictions: No   Pertinent Vitals/Pain 6/10 abdomen. Also c/o catheter being uncomfortable.    Mobility  Bed Mobility Details for Bed Mobility Assistance: Pt is now in bari bed with air mattress that only allows enter/exit from chair position/foot of bed. Pt fearful due to incident late last night/early morning-pt stated she slid out of bed.  Transfers Transfers: Sit to Stand;Stand to Sit Sit to Stand: 1: +2 Total assist;From bed;From toilet Sit to Stand: Patient Percentage: 80% Stand to Sit: 4: Min assist;With upper extremity assist;With armrests;To chair/3-in-1;To toilet Details for Transfer Assistance: Multimodal cues for safety, technique, hand placement. +2 for safety to rise from bari bed-Min A to rise from toilet. Pt extremely fearful of standing  from bed.  Ambulation/Gait Ambulation/Gait Assistance: 4: Min assist Ambulation Distance (Feet): 30 Feet (15' x 2) Assistive device: Rolling walker Ambulation/Gait Assistance Details: VCS safety, distance from RW. Max encouragment for pt to ambulate in room.  Gait Pattern: Decreased stride length;Decreased step length - right;Decreased step length - left    Exercises     PT Diagnosis:    PT Problem List:   PT Treatment Interventions:     PT Goals Acute Rehab PT Goals Pt will go Sit to Stand: with supervision PT Goal: Sit to Stand - Progress: Progressing toward goal Pt will go Stand to Sit: with supervision PT Goal: Stand to Sit - Progress: Progressing toward goal Pt will Ambulate: 51 - 150 feet;with supervision;with least restrictive assistive device PT Goal: Ambulate - Progress: Updated due to goal met  Visit Information  Last PT Received On: 04/13/12    Subjective Data  Subjective: "I'm scared to get out of this bed." Patient Stated Goal: Less pain   Cognition  Overall Cognitive Status: Appears within functional limits for tasks assessed/performed Arousal/Alertness: Awake/alert Orientation Level: Appears intact for tasks assessed Behavior During Session: Anxious Cognition - Other Comments: Pt tearful during entire session (fearful of getting out of bari bed, pain). Extra time spent calming pt down to allow for Max participation.     Balance     End of Session PT - End of Session Activity Tolerance: Patient limited by pain;Patient limited by fatigue Patient left: in chair;with call bell/phone within reach Nurse Communication: Mobility status   GP     Rebeca Alert [facility] 04/13/2012, 10:56 AM [phone]

## 2012-04-13 NOTE — Progress Notes (Signed)
Pt requested to get out of bed.  Patient assisted with nurse and 2 techs to stand.  bari air bed put to chair position for patient to stand.  Patient grabbed handles and began to lift with assistance.  Patient immediately begins to say, "i can't do it, im going to sit down.  Patient requested to sit down to foot plate until able to lift to stand.  Patient crying and stating, :i need help, help me, i can't do it now."  Called for lifting help.  Draw pad placed under patient and patient assisted to a stand position with max assist without difficulty.  Patient used walker to ambulate back to bed.  Patient lifted up in bed.  Patient calm and expressing appreciation for helping back to bed.  Plan of care discussed and call light within reach.

## 2012-04-13 NOTE — Progress Notes (Signed)
Patient ID: Jasmine Huff, female   DOB: 27-Dec-1955, 56 y.o.   MRN: [id number] 9 Days Post-Op  Subjective: Pt feels ok today.  A little anxious.  Still no stool in ostomy bag, but tolerating a solid diet  Objective: Vital signs in last 24 hours: Temp:  [97 F (36.1 C)-98.5 F (36.9 C)] 98.5 F (36.9 C) (10/08 0557) Pulse Rate:  [76-83] 77  (10/08 0557) Resp:  [18] 18  (10/08 0557) BP: (104-132)/(61-73) 132/67 mmHg (10/08 0557) SpO2:  [92 %] 92 % (10/08 0557) Last BM Date: 04/08/12 (colostomy to l quad with slight bloody drainage noted)  Intake/Output from previous day: 10/07 0701 - 10/08 0700 In: 1397 [P.O.:720; I.V.:377; IV Piggyback:300] Out: 955 [Urine:925; Drains:30] Intake/Output this shift:    PE: Abd: soft, VAC in place, ostomy with air, but not stool.  +BS  Lab Results:   Basename 04/13/12 0525 04/12/12 0315  WBC 18.7* 22.1*  HGB 9.1* 9.7*  HCT 29.4* 30.2*  PLT 538* 555*   BMET  Basename 04/11/12 0410  NA 137  K 3.7  CL 99  CO2 27  GLUCOSE 128*  BUN 13  CREATININE 0.85  CALCIUM 8.1*   PT/INR No results found for this basename: LABPROT:2,INR:2 in the last 72 hours CMP     Component Value Date/Time   NA 137 04/11/2012 0410   K 3.7 04/11/2012 0410   CL 99 04/11/2012 0410   CO2 27 04/11/2012 0410   GLUCOSE 128* 04/11/2012 0410   BUN 13 04/11/2012 0410   CREATININE 0.85 04/11/2012 0410   CREATININE 0.79 10/21/2011 0925   CALCIUM 8.1* 04/11/2012 0410   PROT 5.8* 04/09/2012 0455   ALBUMIN 1.7* 04/09/2012 0455   AST 19 04/09/2012 0455   ALT 14 04/09/2012 0455   ALKPHOS 84 04/09/2012 0455   BILITOT 0.2* 04/09/2012 0455   GFRNONAA 75* 04/11/2012 0410   GFRAA 87* 04/11/2012 0410   Lipase  No results found for this basename: lipase       Studies/Results: No results found.  Anti-infectives: Anti-infectives     Start     Dose/Rate Route Frequency Ordered Stop   04/12/12 2200   hydroxychloroquine (PLAQUENIL) tablet 200 mg        200 mg Oral 2 times daily  04/12/12 1109     04/10/12 1000  piperacillin-tazobactam (ZOSYN) IVPB 3.375 g       3.375 g 12.5 mL/hr over 240 Minutes Intravenous 3 times per day 04/10/12 0740     04/09/12 1200   vancomycin (VANCOCIN) 1,250 mg in sodium chloride 0.9 % 250 mL IVPB  Status:  Discontinued        1,250 mg 166.7 mL/hr over 90 Minutes Intravenous Every 12 hours 04/09/12 1050 04/10/12 0740   04/07/12 1830   hydroxychloroquine (PLAQUENIL) tablet 200 mg  Status:  Discontinued        200 mg Oral Daily 04/07/12 1757 04/12/12 1109   04/06/12 1600   vancomycin (VANCOCIN) 1,750 mg in sodium chloride 0.9 % 500 mL IVPB  Status:  Discontinued        1,750 mg 250 mL/hr over 120 Minutes Intravenous Every 24 hours 04/06/12 1350 04/09/12 1050   04/05/12 1600   ertapenem (INVANZ) 1 g in sodium chloride 0.9 % 50 mL IVPB  Status:  Discontinued        1 g 100 mL/hr over 30 Minutes Intravenous Every 24 hours 04/04/12 2125 04/10/12 0740   04/04/12 2300   micafungin (MYCAMINE) 100 mg in sodium  chloride 0.9 % 100 mL IVPB        100 mg 100 mL/hr over 1 Hours Intravenous Every 24 hours 04/04/12 2230     04/04/12 1945   clindamycin (CLEOCIN) 900 mg, gentamicin (GARAMYCIN) 240 mg in sodium chloride 0.9 % 1,000 mL for intraperitoneal lavage         Intraperitoneal To Surgery 04/04/12 1931 04/04/12 2004   04/04/12 1530   ertapenem (INVANZ) 1 g in sodium chloride 0.9 % 50 mL IVPB        1 g 100 mL/hr over 30 Minutes Intravenous  Once 04/04/12 1516 04/04/12 1641           Assessment/Plan  1. S/p Hartman's 2. Leukocytosis 3. Anxiety  4. Depression  On disability x 4 years for this.  5. Morbid obesity.  6. DVT proph   SQ heparin   Plan: 1. Follow WBC, as it is trending down.  Will d/w MD when we can switch to oral abx coverage 2. Add stool softner to encourage stool in her ostomy 3. Dc to SNF when medically stable   LOS: 9 days    OSBORNE,KELLY E 04/13/2012, 10:24 AM Pager: [phone]  Difficult patient.  Poor  ambulation.  To try foley out.  Crying when trying to talk about disposition.   Constipation/hard stool may be an issue.  According to Dr. Maisie Fus, she had very hard stool in the colon at the time of surgery, so getting reasonable bowel function may be difficult.  Miralax and Colace added to regimen. She also still has a drain in place, but the drainage does not look bad.  Will pull tomorrow if WBC cont to improve.  Ovidio Kin, MD, [facility] Surgery Pager: [phone] Office phone:  [phone]

## 2012-04-13 NOTE — Progress Notes (Signed)
Patient unable to void s/p  Foley catheter removal today frequent attempts to urinate -unable to void x 8 hours -DR. WILSON notified -order received to replace Foley catheter.

## 2012-04-13 NOTE — Progress Notes (Signed)
CSW following to assist with d/c planning. SNF bed offers provided to pt for placement. Rep from [facility] met with pt today to answer questions. CSW will arrange additional visits from [facility] SNFs to help pt with her SNF choices.   Cori Razor LCSW [phone]

## 2012-04-13 NOTE — Progress Notes (Signed)
Occupational Therapy Treatment Patient Details Name: Jasmine Huff MRN: [id number] DOB: [birth year] Today's Date: 04/13/2012 Time: 4098-1191 OT Time Calculation (min): 27 min  OT Assessment / Plan / Recommendation Comments on Treatment Session Max encouragement for OOB/toilet transfer this session. Pt anxious, fearful, tearful duirng session. Had incident last night-slid out of bari bed when trying to exit but pt agreeable to get up with PT/OT. Allowed extra time for pt to better participate.     Follow Up Recommendations  Skilled nursing facility    Barriers to Discharge       Equipment Recommendations  Rolling walker with 5" wheels;3 in 1 bedside comode;Other (comment) (wide)    Recommendations for Other Services    Frequency Min 2X/week   Plan Discharge plan remains appropriate    Precautions / Restrictions Precautions Precautions: Fall Precaution Comments: abdominal surgery; drains Restrictions Weight Bearing Restrictions: No        ADL  Toilet Transfer: Performed;Minimal assistance Toilet Transfer Method: Other (comment) (used RW to transfer into bathroom today.) Toilet Transfer Equipment: Comfort height toilet;Grab bars ADL Comments: Pt needs encouragement throughout session as she is tearful and gets anxious with activity. States she fell out of the bed last night so she needed reassurance throughout process of getting out of new bari bed in her room. This bed only allows exit at foot of bed. Updated toielting goal to include transfer into bathroom on and off toilet.     OT Diagnosis:    OT Problem List:   OT Treatment Interventions:     OT Goals Miscellaneous OT Goals Miscellaneous OT Goal #1: Pt will complete all aspect of toilet transfer and toileting with min guard assist and RW and comfort toilet/grab bar. OT Goal: Miscellaneous Goal #1 - Progress: Goal set today  Visit Information  Last OT Received On: 04/13/12 Assistance Needed: +2 (for safety especially  with bari bed with exit at foot of bed) PT/OT Co-Evaluation/Treatment: Yes    Subjective Data  Subjective: I am really anxious Patient Stated Goal: pt did agree to get up to bathroom to practice toilet transfer   Prior Functioning       Cognition  Overall Cognitive Status: Appears within functional limits for tasks assessed/performed Arousal/Alertness: Awake/alert Orientation Level: Appears intact for tasks assessed Behavior During Session: Anxious Cognition - Other Comments: pt tearful during session (fearful of getting out of bari bed) encouraged pt and gave extra time to help pt adjust to each step of task.     Mobility  Shoulder Instructions Bed Mobility Details for Bed Mobility Assistance: Pt is now in bari bed with air mattress that only allows enter/exit from chair position/foot of bed. Pt fearful due to incident late last night/early morning-pt stated she slid out of bed.  Transfers Sit to Stand: 1: +2 Total assist;From bed;Other (comment) (min assist to rise from toilet with grab bar) Sit to Stand: Patient Percentage: 80% Stand to Sit: 4: Min assist;With upper extremity assist;With armrests;To chair/3-in-1;To toilet Details for Transfer Assistance: Multimodal cues for safety, technique, hand placement. +2 for safety to rise from bari bed-Min A to rise from toilet. Pt extremely fearful of standing from bed.        Exercises      Balance     End of Session OT - End of Session Activity Tolerance: Patient limited by pain;Other (comment) (anxiety with mobility) Patient left: in chair;with call bell/phone within reach  GO     Lennox Laity [phone] 04/13/2012, 11:58 AM

## 2012-04-14 LAB — CBC
HCT: 28.6 % — ABNORMAL LOW (ref 36.0–46.0)
Hemoglobin: 9 g/dL — ABNORMAL LOW (ref 12.0–15.0)
MCH: 26.5 pg (ref 26.0–34.0)
MCHC: 31.5 g/dL (ref 30.0–36.0)
MCV: 84.1 fL (ref 78.0–100.0)
RBC: 3.4 MIL/uL — ABNORMAL LOW (ref 3.87–5.11)

## 2012-04-14 LAB — GLUCOSE, CAPILLARY
Glucose-Capillary: 150 mg/dL — ABNORMAL HIGH (ref 70–99)
Glucose-Capillary: 151 mg/dL — ABNORMAL HIGH (ref 70–99)
Glucose-Capillary: 201 mg/dL — ABNORMAL HIGH (ref 70–99)

## 2012-04-14 MED ORDER — AMOXICILLIN-POT CLAVULANATE 875-125 MG PO TABS
1.0000 | ORAL_TABLET | Freq: Two times a day (BID) | ORAL | Status: DC
  Administered 2012-04-14 – 2012-04-16 (×5): 1 via ORAL
  Filled 2012-04-14 (×6): qty 1

## 2012-04-14 MED ORDER — ENSURE COMPLETE PO LIQD
237.0000 mL | Freq: Every day | ORAL | Status: DC
  Administered 2012-04-14 – 2012-04-15 (×2): 237 mL via ORAL

## 2012-04-14 NOTE — Progress Notes (Signed)
Foley catheter discontinued per order.  75ml of cloudy yellow urine emptied.  Patient encourage to more fluids to aid in increasing urine output.  Francene Finders, RN

## 2012-04-14 NOTE — Progress Notes (Signed)
Patient ID: Jasmine Huff, female   DOB: 1955/12/13, 56 y.o.   MRN: [id number] 10 Days Post-Op  Subjective: Pt had a lot of ostomy output overnight.  Her foley was replaced, according to the patient, because the staff overnight did not want to get her out of bed to the bedside commode.  Tolerating diet  Objective: Vital signs in last 24 hours: Temp:  [98.3 F (36.8 C)-98.9 F (37.2 C)] 98.3 F (36.8 C) (10/09 0600) Pulse Rate:  [72-88] 76  (10/09 0600) Resp:  [18] 18  (10/09 0600) BP: (121-129)/(70-78) 121/70 mmHg (10/09 0600) SpO2:  [94 %-96 %] 95 % (10/09 0600) Last BM Date: 04/13/12  Intake/Output from previous day: 10/08 0701 - 10/09 0700 In: 1071.3 [P.O.:840; I.V.:231.3] Out: 1500 [Urine:700; Stool:800] Intake/Output this shift: Total I/O In: 240 [P.O.:240] Out: 230 [Urine:225; Drains:5]  PE: Abd: soft, wound is clean with 100% granulation tissue. +BS, ostomy with lots of stool present.  Stoma is pink.  JP with minimal output.  Lab Results:   Basename 04/14/12 0513 04/13/12 0525  WBC 15.2* 18.7*  HGB 9.0* 9.1*  HCT 28.6* 29.4*  PLT 587* 538*   BMET No results found for this basename: NA:2,K:2,CL:2,CO2:2,GLUCOSE:2,BUN:2,CREATININE:2,CALCIUM:2 in the last 72 hours PT/INR No results found for this basename: LABPROT:2,INR:2 in the last 72 hours CMP     Component Value Date/Time   NA 137 04/11/2012 0410   K 3.7 04/11/2012 0410   CL 99 04/11/2012 0410   CO2 27 04/11/2012 0410   GLUCOSE 128* 04/11/2012 0410   BUN 13 04/11/2012 0410   CREATININE 0.85 04/11/2012 0410   CREATININE 0.79 10/21/2011 0925   CALCIUM 8.1* 04/11/2012 0410   PROT 5.8* 04/09/2012 0455   ALBUMIN 1.7* 04/09/2012 0455   AST 19 04/09/2012 0455   ALT 14 04/09/2012 0455   ALKPHOS 84 04/09/2012 0455   BILITOT 0.2* 04/09/2012 0455   GFRNONAA 75* 04/11/2012 0410   GFRAA 87* 04/11/2012 0410   Lipase  No results found for this basename: lipase       Studies/Results: No results  found.  Anti-infectives: Anti-infectives     Start     Dose/Rate Route Frequency Ordered Stop   04/14/12 1115  amoxicillin-clavulanate (AUGMENTIN) 875-125 MG per tablet 1 tablet       1 tablet Oral Every 12 hours 04/14/12 1100     04/12/12 2200   hydroxychloroquine (PLAQUENIL) tablet 200 mg        200 mg Oral 2 times daily 04/12/12 1109     04/10/12 1000   piperacillin-tazobactam (ZOSYN) IVPB 3.375 g  Status:  Discontinued        3.375 g 12.5 mL/hr over 240 Minutes Intravenous 3 times per day 04/10/12 0740 04/14/12 1100   04/09/12 1200   vancomycin (VANCOCIN) 1,250 mg in sodium chloride 0.9 % 250 mL IVPB  Status:  Discontinued        1,250 mg 166.7 mL/hr over 90 Minutes Intravenous Every 12 hours 04/09/12 1050 04/10/12 0740   04/07/12 1830   hydroxychloroquine (PLAQUENIL) tablet 200 mg  Status:  Discontinued        200 mg Oral Daily 04/07/12 1757 04/12/12 1109   04/06/12 1600   vancomycin (VANCOCIN) 1,750 mg in sodium chloride 0.9 % 500 mL IVPB  Status:  Discontinued        1,750 mg 250 mL/hr over 120 Minutes Intravenous Every 24 hours 04/06/12 1350 04/09/12 1050   04/05/12 1600   ertapenem (INVANZ) 1 g in sodium chloride  0.9 % 50 mL IVPB  Status:  Discontinued        1 g 100 mL/hr over 30 Minutes Intravenous Every 24 hours 04/04/12 2125 04/10/12 0740   04/04/12 2300   micafungin (MYCAMINE) 100 mg in sodium chloride 0.9 % 100 mL IVPB        100 mg 100 mL/hr over 1 Hours Intravenous Every 24 hours 04/04/12 2230     04/04/12 1945   clindamycin (CLEOCIN) 900 mg, gentamicin (GARAMYCIN) 240 mg in sodium chloride 0.9 % 1,000 mL for intraperitoneal lavage         Intraperitoneal To Surgery 04/04/12 1931 04/04/12 2004   04/04/12 1530   ertapenem (INVANZ) 1 g in sodium chloride 0.9 % 50 mL IVPB        1 g 100 mL/hr over 30 Minutes Intravenous  Once 04/04/12 1516 04/04/12 1641          Assessment/Plan  1. S/p Hartman's 2. Severe depression  Seen by Dr. Idamae Schuller on  04/09/2012  Ms. Capo thinks that the Orlinda Blalock is not helping her.  Will consider recalling Dr. Paulino Rily. 3. Deconditioning 4. Leukocytosis, improving daily 5. Morbid obesity.  6. DVT proph   SQ heparin   Plan: 1. Will dc foley once again 2. Dc IV abx, change to po augmentin 3. Cont bowel regimen secondary to constipation 4. Try and mobilize as much as possible 5. Recheck cbc in am and make sure WBC cont to trend down with the change to oral abx. 6. If all continues to look good, suspect she should be medically stable for dc to SNF within the next 1-2 days. D/w pt.   LOS: 10 days    OSBORNE,KELLY E 04/14/2012, 11:01 AM Pager: [phone]  Mobility is the issue.  I expect this was an issue before her acute illness. Patient thinks that the Orlinda Blalock is not helping her. To get foley out. Overall progressing.  Ovidio Kin, MD, [facility] Surgery Pager: [phone] Office phone:  [phone]

## 2012-04-14 NOTE — Progress Notes (Signed)
CSW assisting with d/c planning. [facility] visited with pt today . Pt has not yet chosen SNF but will review info left by facility reps.  Cori Razor LCSW [phone]

## 2012-04-14 NOTE — Progress Notes (Addendum)
Nutrition Follow-up  Intervention: Ensure Complete at night. Used teach back method to discuss colostomy diet and foods/beverages that can lessen colostomy output. Provided handout of this information. Encouraged pt to continue to eat well. Will monitor.   Diet Order: Regular, 60-100% intake  - Pt reports she has been eating well. Noted pt with colostomy output yesterday. Pt reports improved appetite. Pt interested in getting Ensure at night. Pt with wound VAC on right abdomen.   Meds: Scheduled Meds:   . amoxicillin-clavulanate  1 tablet Oral Q12H  . ARIPiprazole  5 mg Oral Daily  . docusate sodium  100 mg Oral BID  . escitalopram  30 mg Oral Daily  . heparin  5,000 Units Subcutaneous Q8H  . hydroxychloroquine  200 mg Oral BID  . insulin aspart  0-5 Units Subcutaneous QHS  . insulin aspart  0-9 Units Subcutaneous TID WC  . micafungin (MYCAMINE) IV  100 mg Intravenous Q24H  . morphine  30 mg Oral Q12H  . pantoprazole  40 mg Oral QHS  . polyethylene glycol  17 g Oral Daily  . DISCONTD: piperacillin-tazobactam (ZOSYN)  IV  3.375 g Intravenous Q8H   Continuous Infusions:   . sodium chloride 20 mL/hr at 04/13/12 1432   PRN Meds:.sodium chloride, clonazePAM, menthol-cetylpyridinium, ondansetron, oxyCODONE, oxyCODONE-acetaminophen, sodium chloride, traZODone, zolpidem  Labs:  CMP     Component Value Date/Time   NA 137 04/11/2012 0410   K 3.7 04/11/2012 0410   CL 99 04/11/2012 0410   CO2 27 04/11/2012 0410   GLUCOSE 128* 04/11/2012 0410   BUN 13 04/11/2012 0410   CREATININE 0.85 04/11/2012 0410   CREATININE 0.79 10/21/2011 0925   CALCIUM 8.1* 04/11/2012 0410   PROT 5.8* 04/09/2012 0455   ALBUMIN 1.7* 04/09/2012 0455   AST 19 04/09/2012 0455   ALT 14 04/09/2012 0455   ALKPHOS 84 04/09/2012 0455   BILITOT 0.2* 04/09/2012 0455   GFRNONAA 75* 04/11/2012 0410   GFRAA 87* 04/11/2012 0410   CBG (last 3)   Basename 04/14/12 1229 04/14/12 0813 04/13/12 2224  GLUCAP 150* 144* 143*       Intake/Output Summary (Last 24 hours) at 04/14/12 1335 Last data filed at 04/14/12 1000  Gross per 24 hour  Intake    760 ml  Output   1910 ml  Net  -1150 ml   Colostomy output - so far today  Weight Status:   10/2 298 lb 8.1 oz 10/7 297 lb 8 oz  Estimated needs:   1850-1950 calories 95-105g protein  Nutrition Dx: Inadequate oral intake - improved  Goal: Diet advancement - met  New goal: Pt to consume >90% of meals/supplements  Monitor: Weights, labs, intake, colostomy output   Levon Hedger MS, RD, LDN [phone] Pager [phone] After Hours Pager

## 2012-04-15 ENCOUNTER — Inpatient Hospital Stay (HOSPITAL_COMMUNITY): Payer: Medicare Other

## 2012-04-15 LAB — CBC
Platelets: 639 10*3/uL — ABNORMAL HIGH (ref 150–400)
RDW: 16.5 % — ABNORMAL HIGH (ref 11.5–15.5)
WBC: 16.4 10*3/uL — ABNORMAL HIGH (ref 4.0–10.5)

## 2012-04-15 LAB — GLUCOSE, CAPILLARY
Glucose-Capillary: 140 mg/dL — ABNORMAL HIGH (ref 70–99)
Glucose-Capillary: 153 mg/dL — ABNORMAL HIGH (ref 70–99)
Glucose-Capillary: 134 mg/dL — ABNORMAL HIGH (ref 70–99)

## 2012-04-15 MED ORDER — TAMSULOSIN HCL 0.4 MG PO CAPS
0.4000 mg | ORAL_CAPSULE | Freq: Every day | ORAL | Status: DC
  Administered 2012-04-15 – 2012-04-16 (×2): 0.4 mg via ORAL
  Filled 2012-04-15 (×2): qty 1

## 2012-04-15 NOTE — Progress Notes (Signed)
Patient ID: Jasmine Huff, female   DOB: Dec 03, 1955, 56 y.o.   MRN: [id number] 11 Days Post-Op  Subjective: Pt up in a chair.  No new complaints.  Unable to urinate on bedside commode, but able to void in the bed.  Objective: Vital signs in last 24 hours: Temp:  [98.2 F (36.8 C)-99.7 F (37.6 C)] 99.2 F (37.3 C) (10/10 0750) Pulse Rate:  [81-84] 81  (10/10 0750) Resp:  [18] 18  (10/10 0750) BP: (127-130)/(70-78) 130/72 mmHg (10/10 0750) SpO2:  [94 %] 94 % (10/10 0530) Last BM Date: 04/15/12  Intake/Output from previous day: 10/09 0701 - 10/10 0700 In: 1560 [P.O.:1080; I.V.:480] Out: 1485 [Urine:500; Drains:10; Stool:975] Intake/Output this shift: Total I/O In: -  Out: 300 [Stool:300]  PE: Abd: soft, minimally tender, +BS, obese, ostomy with air and stool present.  Lab Results:   Basename 04/15/12 0549 04/14/12 0513  WBC 16.4* 15.2*  HGB 9.0* 9.0*  HCT 29.3* 28.6*  PLT 639* 587*   BMET No results found for this basename: NA:2,K:2,CL:2,CO2:2,GLUCOSE:2,BUN:2,CREATININE:2,CALCIUM:2 in the last 72 hours PT/INR No results found for this basename: LABPROT:2,INR:2 in the last 72 hours CMP     Component Value Date/Time   NA 137 04/11/2012 0410   K 3.7 04/11/2012 0410   CL 99 04/11/2012 0410   CO2 27 04/11/2012 0410   GLUCOSE 128* 04/11/2012 0410   BUN 13 04/11/2012 0410   CREATININE 0.85 04/11/2012 0410   CREATININE 0.79 10/21/2011 0925   CALCIUM 8.1* 04/11/2012 0410   PROT 5.8* 04/09/2012 0455   ALBUMIN 1.7* 04/09/2012 0455   AST 19 04/09/2012 0455   ALT 14 04/09/2012 0455   ALKPHOS 84 04/09/2012 0455   BILITOT 0.2* 04/09/2012 0455   GFRNONAA 75* 04/11/2012 0410   GFRAA 87* 04/11/2012 0410   Lipase  No results found for this basename: lipase       Studies/Results: No results found.  Anti-infectives: Anti-infectives     Start     Dose/Rate Route Frequency Ordered Stop   04/14/12 1200  amoxicillin-clavulanate (AUGMENTIN) 875-125 MG per tablet 1 tablet       1 tablet  Oral Every 12 hours 04/14/12 1100     04/12/12 2200   hydroxychloroquine (PLAQUENIL) tablet 200 mg        200 mg Oral 2 times daily 04/12/12 1109     04/10/12 1000   piperacillin-tazobactam (ZOSYN) IVPB 3.375 g  Status:  Discontinued        3.375 g 12.5 mL/hr over 240 Minutes Intravenous 3 times per day 04/10/12 0740 04/14/12 1100   04/09/12 1200   vancomycin (VANCOCIN) 1,250 mg in sodium chloride 0.9 % 250 mL IVPB  Status:  Discontinued        1,250 mg 166.7 mL/hr over 90 Minutes Intravenous Every 12 hours 04/09/12 1050 04/10/12 0740   04/07/12 1830   hydroxychloroquine (PLAQUENIL) tablet 200 mg  Status:  Discontinued        200 mg Oral Daily 04/07/12 1757 04/12/12 1109   04/06/12 1600   vancomycin (VANCOCIN) 1,750 mg in sodium chloride 0.9 % 500 mL IVPB  Status:  Discontinued        1,750 mg 250 mL/hr over 120 Minutes Intravenous Every 24 hours 04/06/12 1350 04/09/12 1050   04/05/12 1600   ertapenem (INVANZ) 1 g in sodium chloride 0.9 % 50 mL IVPB  Status:  Discontinued        1 g 100 mL/hr over 30 Minutes Intravenous Every 24 hours 04/04/12  2125 04/10/12 0740   04/04/12 2300   micafungin (MYCAMINE) 100 mg in sodium chloride 0.9 % 100 mL IVPB        100 mg 100 mL/hr over 1 Hours Intravenous Every 24 hours 04/04/12 2230     04/04/12 1945   clindamycin (CLEOCIN) 900 mg, gentamicin (GARAMYCIN) 240 mg in sodium chloride 0.9 % 1,000 mL for intraperitoneal lavage         Intraperitoneal To Surgery 04/04/12 1931 04/04/12 2004   04/04/12 1530   ertapenem (INVANZ) 1 g in sodium chloride 0.9 % 50 mL IVPB        1 g 100 mL/hr over 30 Minutes Intravenous  Once 04/04/12 1516 04/04/12 1641           Assessment/Plan  1. S/p Hartman's  for colonic perforation - stercoral perf   A.Thomas - 04/04/2012 2. Leukocytosis, persistent  WBC - 16,400 - 04/15/2012 3. HTN 4. Morbidly obese 5. Deconditioning  She did get out of bed to chair today. 6. Depression  7. DVT proph    SQ  heparin  Plan: 1. Patient is close to ready for dc.  Given slight increase in WBC, will order a CT scan today to rule out abdominal infection.  If this is negative, suspect the patient could be ready for dc to SNF tomorrow.  I did d/w the patient yesterday this plan about dc! 2. Cont abx 3. Add flomax to help with urination  LOS: 11 days    OSBORNE,KELLY E 04/15/2012, 11:18 AM Pager: [phone]  Seems to be in a better mood tonight.  For CT scan to try to resolve persistent leukocytosis and make sure everything is okay before discharge.  I talked to her about how she needs to push herself to be active.  Ovidio Kin, MD, [facility] Surgery Pager: [phone] Office phone:  [phone]

## 2012-04-15 NOTE — Progress Notes (Signed)
CSW assisting with d/c planning. Jasmine Huff and [facility] reps visited with pt today to tell her about their skilled nursing facilities. Pt is aware that she could be ready for d/c tomorrow. CSW will continue to follow to assist with d/c planning to SNF.  Cori Razor LCSW [phone]

## 2012-04-15 NOTE — Progress Notes (Signed)
Pt unable to void after 2 attempts within 7 hours. Pt just returned to bed from [facility] with three person assist after attempting to void for 20+ minutes unsuccessfully. Bladder scan shows only 199cc in the bladder and it was reported by previous RN that the pt's rounding PA doesn't want to the pt to have foley placed if not necessary. At this time will continue to encourage fluids and monitor I&O strictly Campos-Garcia, Tresa Endo

## 2012-04-15 NOTE — Progress Notes (Signed)
Physical Therapy Treatment Patient Details Name: Jasmine Huff MRN: [id number] DOB: 1956-03-02 Today's Date: 04/15/2012 Time: 0910-0919 PT Time Calculation (min): 9 min  PT Assessment / Plan / Recommendation Comments on Treatment Session  Pt continues to require Max encouragement for full participation and for progression of activity. Will need SNF.     Follow Up Recommendations  Post acute inpatient     Does the patient have the potential to tolerate intense rehabilitation  No, Recommend SNF  Barriers to Discharge        Equipment Recommendations  Rolling walker with 5" wheels;3 in 1 bedside comode    Recommendations for Other Services    Frequency Min 3X/week   Plan Discharge plan remains appropriate    Precautions / Restrictions Precautions Precautions: Fall Precaution Comments: abdominal surgery, drains Restrictions Weight Bearing Restrictions: No   Pertinent Vitals/Pain Back-unrated    Mobility  Bed Mobility Bed Mobility: Not assessed Details for Bed Mobility Assistance: Pt sitting in recliner Transfers Transfers: Sit to Stand;Stand to Sit Sit to Stand: 4: Min assist Stand to Sit: 4: Min assist Details for Transfer Assistance: VCs safety, technique,hand placement. Assist to rise, stabilize, control descent.  Ambulation/Gait Ambulation/Gait Assistance: 4: Min assist Ambulation Distance (Feet): 20 Feet Assistive device: Rolling walker Ambulation/Gait Assistance Details: VCs safety, posture, distance from RW. Max encouragement for ambulation into hallway. Pt states she cannot stand upright for "long" periods of time. After 10 feet, pt leaned forward and rested forearms/elbows on frame of walker and continued to ambulate in this manner. Pt declined to ambulate further.  Gait Pattern: Decreased stride length;Decreased step length - right;Decreased step length - left;Trunk flexed    Exercises     PT Diagnosis:    PT Problem List:   PT Treatment Interventions:      PT Goals Acute Rehab PT Goals Pt will go Sit to Stand: with supervision PT Goal: Sit to Stand - Progress: Progressing toward goal Pt will go Stand to Sit: with supervision PT Goal: Stand to Sit - Progress: Progressing toward goal Pt will Ambulate: 51 - 150 feet;with supervision;with least restrictive assistive device PT Goal: Ambulate - Progress: Progressing toward goal  Visit Information  Last PT Received On: 04/15/12 Assistance Needed: +2 (safety)    Subjective Data  Subjective: "I can only stand for so long because of my back!" Patient Stated Goal: None stated   Cognition  Overall Cognitive Status: Appears within functional limits for tasks assessed/performed Arousal/Alertness: Awake/alert Orientation Level: Appears intact for tasks assessed Behavior During Session: [facility] for tasks performed Cognition - Other Comments: Pt continues to have fluctuating emotions in short periods of time-laughing one moment, agitated/crying the next.     Balance     End of Session PT - End of Session Activity Tolerance: Patient limited by fatigue;Patient limited by pain Patient left: in chair;with call bell/phone within reach   GP     Rebeca Alert [facility] 04/15/2012, 11:13 AM [phone]

## 2012-04-16 ENCOUNTER — Telehealth (INDEPENDENT_AMBULATORY_CARE_PROVIDER_SITE_OTHER): Payer: Self-pay

## 2012-04-16 DIAGNOSIS — F329 Major depressive disorder, single episode, unspecified: Secondary | ICD-10-CM | POA: Diagnosis not present

## 2012-04-16 DIAGNOSIS — F39 Unspecified mood [affective] disorder: Secondary | ICD-10-CM | POA: Diagnosis not present

## 2012-04-16 DIAGNOSIS — R0989 Other specified symptoms and signs involving the circulatory and respiratory systems: Secondary | ICD-10-CM | POA: Diagnosis not present

## 2012-04-16 DIAGNOSIS — E119 Type 2 diabetes mellitus without complications: Secondary | ICD-10-CM | POA: Diagnosis not present

## 2012-04-16 DIAGNOSIS — E1159 Type 2 diabetes mellitus with other circulatory complications: Secondary | ICD-10-CM | POA: Diagnosis not present

## 2012-04-16 DIAGNOSIS — Z5189 Encounter for other specified aftercare: Secondary | ICD-10-CM | POA: Diagnosis not present

## 2012-04-16 DIAGNOSIS — A419 Sepsis, unspecified organism: Secondary | ICD-10-CM | POA: Diagnosis not present

## 2012-04-16 DIAGNOSIS — R918 Other nonspecific abnormal finding of lung field: Secondary | ICD-10-CM | POA: Diagnosis not present

## 2012-04-16 DIAGNOSIS — T819XXA Unspecified complication of procedure, initial encounter: Secondary | ICD-10-CM | POA: Diagnosis not present

## 2012-04-16 DIAGNOSIS — K219 Gastro-esophageal reflux disease without esophagitis: Secondary | ICD-10-CM | POA: Diagnosis not present

## 2012-04-16 DIAGNOSIS — R109 Unspecified abdominal pain: Secondary | ICD-10-CM | POA: Diagnosis not present

## 2012-04-16 DIAGNOSIS — G4700 Insomnia, unspecified: Secondary | ICD-10-CM | POA: Diagnosis not present

## 2012-04-16 DIAGNOSIS — F411 Generalized anxiety disorder: Secondary | ICD-10-CM | POA: Diagnosis not present

## 2012-04-16 DIAGNOSIS — L0291 Cutaneous abscess, unspecified: Secondary | ICD-10-CM | POA: Diagnosis not present

## 2012-04-16 DIAGNOSIS — E669 Obesity, unspecified: Secondary | ICD-10-CM | POA: Diagnosis not present

## 2012-04-16 DIAGNOSIS — J189 Pneumonia, unspecified organism: Secondary | ICD-10-CM | POA: Diagnosis not present

## 2012-04-16 DIAGNOSIS — G890 Central pain syndrome: Secondary | ICD-10-CM | POA: Diagnosis not present

## 2012-04-16 DIAGNOSIS — R509 Fever, unspecified: Secondary | ICD-10-CM | POA: Diagnosis not present

## 2012-04-16 DIAGNOSIS — M129 Arthropathy, unspecified: Secondary | ICD-10-CM | POA: Diagnosis not present

## 2012-04-16 DIAGNOSIS — B370 Candidal stomatitis: Secondary | ICD-10-CM | POA: Diagnosis not present

## 2012-04-16 DIAGNOSIS — D509 Iron deficiency anemia, unspecified: Secondary | ICD-10-CM | POA: Diagnosis not present

## 2012-04-16 DIAGNOSIS — E860 Dehydration: Secondary | ICD-10-CM | POA: Diagnosis not present

## 2012-04-16 DIAGNOSIS — I10 Essential (primary) hypertension: Secondary | ICD-10-CM | POA: Diagnosis not present

## 2012-04-16 DIAGNOSIS — K5732 Diverticulitis of large intestine without perforation or abscess without bleeding: Secondary | ICD-10-CM | POA: Diagnosis not present

## 2012-04-16 LAB — CBC
HCT: 27.9 % — ABNORMAL LOW (ref 36.0–46.0)
MCHC: 30.8 g/dL (ref 30.0–36.0)
RDW: 16.4 % — ABNORMAL HIGH (ref 11.5–15.5)

## 2012-04-16 LAB — GLUCOSE, CAPILLARY
Glucose-Capillary: 135 mg/dL — ABNORMAL HIGH (ref 70–99)
Glucose-Capillary: 151 mg/dL — ABNORMAL HIGH (ref 70–99)
Glucose-Capillary: 146 mg/dL — ABNORMAL HIGH (ref 70–99)

## 2012-04-16 MED ORDER — MORPHINE SULFATE ER 30 MG PO TBCR: 60.0000 mg | EXTENDED_RELEASE_TABLET | Freq: Two times a day (BID) | ORAL | Status: DC

## 2012-04-16 MED ORDER — OXYCODONE-ACETAMINOPHEN 5-325 MG PO TABS: 1.0000 | ORAL_TABLET | ORAL | Status: DC | PRN

## 2012-04-16 MED ORDER — PNEUMOCOCCAL VAC POLYVALENT 25 MCG/0.5ML IJ INJ
0.5000 mL | INJECTION | Freq: Once | INTRAMUSCULAR | Status: DC
  Filled 2012-04-16: qty 0.5

## 2012-04-16 MED ORDER — AMOXICILLIN-POT CLAVULANATE 875-125 MG PO TABS: 1.0000 | ORAL_TABLET | Freq: Two times a day (BID) | ORAL | Status: DC

## 2012-04-16 NOTE — Progress Notes (Signed)
Spoke to [employer], [place] for [facility] states she is going to set up for patient to go to [facility] today. To place a wet to dry drsg where wound vac is placed for transport and [facility].

## 2012-04-16 NOTE — Progress Notes (Signed)
Called report to Hughes DON at [facility]. Spoke with social worker states she is going to call when transport is on the way.

## 2012-04-16 NOTE — Progress Notes (Signed)
CSW assisting with d/c planning. Pt has chosen [facility] for SNF placement. SNF will have a room available today if pt is ready for d/c. CSW will continue to assist with d/c planning to SNF.  Cori Razor LCSW [phone]

## 2012-04-16 NOTE — Progress Notes (Signed)
Pt will be d/c to [facility] today via [facility] transport.   Cori Razor LCSW [phone]

## 2012-04-16 NOTE — Consult Note (Signed)
Wound care follow-up: Pt previously had a vac to her abdominal wound.  Bedside nurse in earlier to remove dressing and apply moist gauze to prepare pt for transfer to SNF.  She states another brand of negative pressure will be applied upon arrival to facility. Abd wound not assessed at this time.    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 Peristomal assessment: Intact skin surrounding.  Output 50cc semi-formed brown stool. Ostomy pouching: 1pc.  Education provided: Demonstrated pouch application and emptying.  Pt able to open and close velcro.  Will need assistance with pouch application at SNF.  She watched procedure and asked appropriate questions, but admitted she is overwhelmed and became tearful and states she is scared about her transfer to another facility. Reinforced that she will not have to be independent regarding pouching routines at this time and nursing staff will help her at the next  facility.  3 flat flexible pouches at bedside for impending transfer to SNF.    Cammie Mcgee, RN, MSN, [employer]  [phone]

## 2012-04-16 NOTE — Telephone Encounter (Signed)
I called and scheduled the patient's follow up to come to our office with Chip Boer at [facility].  The appointment is 10/25 at 4:20 pm.  I asked her to arrive at 4pm

## 2012-04-16 NOTE — Discharge Summary (Signed)
Patient ID: Jasmine Huff MRN: [id number] DOB/AGE: 1956-02-21 56 y.o.  Admit date: 04/04/2012 Discharge date: 04/16/2012  Procedures:Left hemicolectomy with splenic flexure mobilization and end colostomy -  Dr. Clovis Pu - 04/04/2012  Consults: internal medicine, critical care  Reason for Admission: Jasmine Huff is a 56 y.o. female who presents to the ED with severe abd pain. She states that she has had LLQ pain for about a week and that it has worsened in the past 3 days. She denies nausea or vomiting. She is currently bedridden and the last time she was out of bed was about 2 months ago. She has a PMH sig for fibromyalgia, DM2, HTN and depression.  Admission Diagnoses:  1. Pneumoperitoneum Patient Active Problem List  Diagnosis  . Cellulitis  . Hypertension  . Fibromyalgia  . Chronic pain  . Urinary frequency  . Other and unspecified hyperlipidemia  . Obesity  . Insomnia  . Depression  . Hyperglycemia  . GERD (gastroesophageal reflux disease)  . Major depressive disorder, recurrent episode, severe, without mention of psychotic behavior  . Dehydration  . ARF (acute renal failure)  . Oral thrush  . Hypoxia  . Diabetes mellitus  . OSA (obstructive sleep apnea)  . Respiratory failure, post-operative  . Severe sepsis(995.92)  . Perforation of colon    Hospital Course: The patient was admitted and taken to the operating room where she underwent a laparotomy with left hemicolectomy and end colostomy.  She was sent to the ICU intubated.  She was extubated on POD# 2.  She had an NGT in place and a post operative ileus. Her wound was open with a wound VAC in place as well as a JP drain.  Her NGT was removed on day 3 and started on clear liquids.  She was transferred out of the ICU at this time as well.  She was started on Invanz upon arrival, but vanc and micafungin was added while in the ICU, but ultimately stopped.  Her diet was able to be slowly advanced as tolerated.  By POD# 8, she  was tolerating a regular diet, but had not had any output in her ostomy except air.  She was placed on a bowel regimen using miralax and colace.  She then began moving her bowels very well.    For some reason, her WBC remained elevated postoperatively.  She was kept on Invanz.  By POD#9, this started to trend down.  She was ultimately switched to oral augmentin.  A CT scan was obtained to rule out any complications, which there were none.  By POD# 12, the patient was stable for dc to SNF.  The patient has significant depression issues.  She does not mobilize very much or at all secondary to a lack of motivation as well as her depression.  She did very little mobilizing while here in the hospital.   Discharge Diagnoses:  Principal Problem:  *Perforation of colon Active Problems:  Hypertension  Fibromyalgia  Depression  ARF (acute renal failure)  Diabetes mellitus  Respiratory failure, post-operative  Severe sepsis(995.92) s/p ex lap with Hartman's procedure  Discharge Medications:   Medication List     As of 04/16/2012  9:10 AM    STOP taking these medications         oxyCODONE-acetaminophen 10-325 MG per tablet   Commonly known as: PERCOCET      TAKE these medications         amoxicillin-clavulanate 875-125 MG per tablet   Commonly known as: AUGMENTIN  Take 1 tablet by mouth every 12 (twelve) hours.      aspirin EC 81 MG tablet   Take 81 mg by mouth daily.      cetirizine 10 MG tablet   Commonly known as: ZYRTEC   Take 10 mg by mouth at bedtime.      clonazePAM 0.5 MG tablet   Commonly known as: KLONOPIN   Take 0.5 mg by mouth 3 (three) times daily as needed. For anxiety/rest      escitalopram 20 MG tablet   Commonly known as: LEXAPRO   Take 1.5 tablets (30 mg total) by mouth daily.      hydrochlorothiazide 25 MG tablet   Commonly known as: HYDRODIURIL   Take 25 mg by mouth daily.      hydroxychloroquine 200 MG tablet   Commonly known as: PLAQUENIL   Take  200 mg by mouth 2 (two) times daily.      Iron 240 (27 FE) MG Tabs   Take 1 tablet by mouth daily.      morphine 60 MG 12 hr tablet   Commonly known as: MS CONTIN   Take 60 mg by mouth 2 (two) times daily as needed. For pain.      morphine 30 MG 12 hr tablet   Commonly known as: MS CONTIN   Take 2 tablets (60 mg total) by mouth every 12 (twelve) hours.      omeprazole 40 MG capsule   Commonly known as: PRILOSEC   Take 40 mg by mouth daily.      ondansetron 4 MG tablet   Commonly known as: ZOFRAN   Take 4 mg by mouth every 8 (eight) hours as needed. For nausea.      oxyCODONE-acetaminophen 5-325 MG per tablet   Commonly known as: PERCOCET/ROXICET   Take 1-2 tablets by mouth every 4 (four) hours as needed.      traZODone 100 MG tablet   Commonly known as: DESYREL   Take 1 tablet (100 mg total) by mouth at bedtime.           Miralax 17g in 8 oz of liquid daily    Discharge Instructions:     Follow-up Information    Follow up with Vanita Panda., MD. In 2 weeks. (our office will call you with appointment time)    Contact information:   [address] [place] [address] [phone]        Please replace the [facility] when patient arrives at facility and change M,W,F Routine ostomy care Daily bowel regimen  Signed: OSBORNE,KELLY E 04/16/2012, 9:10 AM  Agree with above.  Mobilization is a big problem for the patient.  Ovidio Kin, M.D. [facility] [phone]

## 2012-04-16 NOTE — Progress Notes (Addendum)
Patient discharged to [facility] via EMS. Packet sent with patient. No change in assessment. Central line D/c by IV team RN

## 2012-04-20 DIAGNOSIS — K5732 Diverticulitis of large intestine without perforation or abscess without bleeding: Secondary | ICD-10-CM | POA: Diagnosis not present

## 2012-04-20 DIAGNOSIS — A419 Sepsis, unspecified organism: Secondary | ICD-10-CM | POA: Diagnosis not present

## 2012-04-20 DIAGNOSIS — E669 Obesity, unspecified: Secondary | ICD-10-CM | POA: Diagnosis not present

## 2012-04-20 DIAGNOSIS — E1159 Type 2 diabetes mellitus with other circulatory complications: Secondary | ICD-10-CM | POA: Diagnosis not present

## 2012-04-20 DIAGNOSIS — G890 Central pain syndrome: Secondary | ICD-10-CM | POA: Diagnosis not present

## 2012-04-20 DIAGNOSIS — D509 Iron deficiency anemia, unspecified: Secondary | ICD-10-CM | POA: Diagnosis not present

## 2012-04-26 DIAGNOSIS — F329 Major depressive disorder, single episode, unspecified: Secondary | ICD-10-CM | POA: Diagnosis not present

## 2012-04-26 DIAGNOSIS — F411 Generalized anxiety disorder: Secondary | ICD-10-CM | POA: Diagnosis not present

## 2012-04-26 DIAGNOSIS — G4700 Insomnia, unspecified: Secondary | ICD-10-CM | POA: Diagnosis not present

## 2012-04-26 DIAGNOSIS — F39 Unspecified mood [affective] disorder: Secondary | ICD-10-CM | POA: Diagnosis not present

## 2012-04-28 ENCOUNTER — Emergency Department (HOSPITAL_COMMUNITY): Payer: Medicare Other

## 2012-04-28 ENCOUNTER — Encounter (HOSPITAL_COMMUNITY): Payer: Self-pay

## 2012-04-28 ENCOUNTER — Inpatient Hospital Stay (HOSPITAL_COMMUNITY)
Admission: EM | Admit: 2012-04-28 | Discharge: 2012-04-30 | DRG: 194 | Disposition: A | Discharge status: Skilled Nursing Facility | Payer: Medicare Other | Attending: Internal Medicine | Admitting: Internal Medicine

## 2012-04-28 DIAGNOSIS — Y95 Nosocomial condition: Secondary | ICD-10-CM

## 2012-04-28 DIAGNOSIS — E785 Hyperlipidemia, unspecified: Secondary | ICD-10-CM | POA: Diagnosis present

## 2012-04-28 DIAGNOSIS — S31109A Unspecified open wound of abdominal wall, unspecified quadrant without penetration into peritoneal cavity, initial encounter: Secondary | ICD-10-CM

## 2012-04-28 DIAGNOSIS — F3289 Other specified depressive episodes: Secondary | ICD-10-CM | POA: Diagnosis present

## 2012-04-28 DIAGNOSIS — I10 Essential (primary) hypertension: Secondary | ICD-10-CM

## 2012-04-28 DIAGNOSIS — G4700 Insomnia, unspecified: Secondary | ICD-10-CM | POA: Diagnosis present

## 2012-04-28 DIAGNOSIS — G4733 Obstructive sleep apnea (adult) (pediatric): Secondary | ICD-10-CM

## 2012-04-28 DIAGNOSIS — E119 Type 2 diabetes mellitus without complications: Secondary | ICD-10-CM | POA: Diagnosis present

## 2012-04-28 DIAGNOSIS — IMO0001 Reserved for inherently not codable concepts without codable children: Secondary | ICD-10-CM | POA: Diagnosis present

## 2012-04-28 DIAGNOSIS — R0989 Other specified symptoms and signs involving the circulatory and respiratory systems: Secondary | ICD-10-CM | POA: Diagnosis not present

## 2012-04-28 DIAGNOSIS — R509 Fever, unspecified: Secondary | ICD-10-CM | POA: Diagnosis not present

## 2012-04-28 DIAGNOSIS — R918 Other nonspecific abnormal finding of lung field: Secondary | ICD-10-CM | POA: Diagnosis not present

## 2012-04-28 DIAGNOSIS — Z9049 Acquired absence of other specified parts of digestive tract: Secondary | ICD-10-CM

## 2012-04-28 DIAGNOSIS — F411 Generalized anxiety disorder: Secondary | ICD-10-CM | POA: Diagnosis present

## 2012-04-28 DIAGNOSIS — F329 Major depressive disorder, single episode, unspecified: Secondary | ICD-10-CM | POA: Diagnosis not present

## 2012-04-28 DIAGNOSIS — J9819 Other pulmonary collapse: Secondary | ICD-10-CM | POA: Diagnosis present

## 2012-04-28 DIAGNOSIS — Z933 Colostomy status: Secondary | ICD-10-CM

## 2012-04-28 DIAGNOSIS — E669 Obesity, unspecified: Secondary | ICD-10-CM | POA: Diagnosis present

## 2012-04-28 DIAGNOSIS — M797 Fibromyalgia: Secondary | ICD-10-CM | POA: Diagnosis present

## 2012-04-28 DIAGNOSIS — J189 Pneumonia, unspecified organism: Principal | ICD-10-CM | POA: Diagnosis present

## 2012-04-28 DIAGNOSIS — R109 Unspecified abdominal pain: Secondary | ICD-10-CM | POA: Diagnosis not present

## 2012-04-28 DIAGNOSIS — F332 Major depressive disorder, recurrent severe without psychotic features: Secondary | ICD-10-CM

## 2012-04-28 DIAGNOSIS — Z6841 Body Mass Index (BMI) 40.0 and over, adult: Secondary | ICD-10-CM

## 2012-04-28 DIAGNOSIS — K219 Gastro-esophageal reflux disease without esophagitis: Secondary | ICD-10-CM | POA: Diagnosis present

## 2012-04-28 DIAGNOSIS — E46 Unspecified protein-calorie malnutrition: Secondary | ICD-10-CM | POA: Diagnosis present

## 2012-04-28 DIAGNOSIS — E860 Dehydration: Secondary | ICD-10-CM

## 2012-04-28 LAB — COMPREHENSIVE METABOLIC PANEL
ALT: 25 U/L (ref 0–35)
AST: 26 U/L (ref 0–37)
Alkaline Phosphatase: 470 U/L — ABNORMAL HIGH (ref 39–117)
CO2: 29 mEq/L (ref 19–32)
Calcium: 8.3 mg/dL — ABNORMAL LOW (ref 8.4–10.5)
GFR calc Af Amer: 81 mL/min — ABNORMAL LOW (ref >90)
GFR calc non Af Amer: 70 mL/min — ABNORMAL LOW (ref >90)
Sodium: 135 mEq/L (ref 135–145)
Total Bilirubin: 0.1 mg/dL — ABNORMAL LOW (ref 0.3–1.2)
Total Protein: 6.6 g/dL (ref 6.0–8.3)

## 2012-04-28 LAB — CBC WITH DIFFERENTIAL/PLATELET
Basophils Absolute: 0.1 10*3/uL (ref 0.0–0.1)
Lymphs Abs: 1.4 10*3/uL (ref 0.7–4.0)
MCV: 83.1 fL (ref 78.0–100.0)
Monocytes Absolute: 1 10*3/uL (ref 0.1–1.0)
Monocytes Relative: 13 % — ABNORMAL HIGH (ref 3–12)
Platelets: 521 10*3/uL — ABNORMAL HIGH (ref 150–400)
RDW: 16.5 % — ABNORMAL HIGH (ref 11.5–15.5)
WBC: 7.8 10*3/uL (ref 4.0–10.5)

## 2012-04-28 LAB — URINALYSIS, ROUTINE W REFLEX MICROSCOPIC
Bilirubin Urine: NEGATIVE
Ketones, ur: NEGATIVE mg/dL
Leukocytes, UA: NEGATIVE
Nitrite: NEGATIVE
Protein, ur: NEGATIVE mg/dL
Urobilinogen, UA: 0.2 mg/dL (ref 0.0–1.0)

## 2012-04-28 LAB — LACTIC ACID, PLASMA: Lactic Acid, Venous: 1.1 mmol/L (ref 0.5–2.2)

## 2012-04-28 MED ORDER — VANCOMYCIN HCL IN DEXTROSE 1-5 GM/200ML-% IV SOLN
1000.0000 mg | Freq: Once | INTRAVENOUS | Status: AC
  Administered 2012-04-28: 1000 mg via INTRAVENOUS
  Filled 2012-04-28: qty 200

## 2012-04-28 MED ORDER — SODIUM CHLORIDE 0.9 % IV BOLUS (SEPSIS)
1000.0000 mL | Freq: Once | INTRAVENOUS | Status: AC
  Administered 2012-04-28: 1000 mL via INTRAVENOUS

## 2012-04-28 MED ORDER — MORPHINE SULFATE 4 MG/ML IJ SOLN
8.0000 mg | Freq: Once | INTRAMUSCULAR | Status: AC
  Administered 2012-04-28: 8 mg via INTRAVENOUS
  Filled 2012-04-28: qty 2

## 2012-04-28 MED ORDER — PIPERACILLIN-TAZOBACTAM 3.375 G IVPB 30 MIN
3.3750 g | Freq: Once | INTRAVENOUS | Status: AC
  Administered 2012-04-28: 3.375 g via INTRAVENOUS
  Filled 2012-04-28: qty 50

## 2012-04-28 NOTE — H&P (Signed)
PCP:   [name], MD  Surgeon: Dr. Maisie Fus  Chief Complaint:  Fever, abdominal cramps  HPI: This is a 56 year old female who was recently admitted here with a perforated colon. She colostomy with subsequent wound infection and a wound VAC in place. She is at a nursing home on Augmentin. The patient states she's been getting some cramps for the past few days, she states his cramps are identical to the patient she had prior to her previous admission. Today she developed a temperature of 101.6. She reports anorexia. She denies any shortness of breath but states she's had a cough for the past few days which is productive of clear phlegm. She denies any wheezing. She denies any burning on urination. She was sent to the ER because of her temperature is without abdominal cramps. History provided by the patient  Review of Systems:  The patient denies anorexia, weight loss,, vision loss, decreased hearing, hoarseness, chest pain, syncope, dyspnea on exertion, peripheral edema, balance deficits, hemoptysis, melena, hematochezia, severe indigestion/heartburn, hematuria, incontinence, genital sores, muscle weakness, suspicious skin lesions, transient blindness, difficulty walking, depression, unusual weight change, abnormal bleeding, enlarged lymph nodes, angioedema, and breast masses.  Past Medical History: Past Medical History  Diagnosis Date  . Arthritis   . Depression   . Allergy   . Hypertension   . Migraine   . UTI (lower urinary tract infection)   . Fibromyalgia   . Chronic pain syndrome   . GERD (gastroesophageal reflux disease) 01/21/2011  . Anxiety   . Panic attacks   . High blood pressure   . Knee pain   . Urination frequency   . Diabetes mellitus type II   . Perforation of colon 04/08/2012   Past Surgical History  Procedure Date  . Cholecystectomy   . Abdominal hysterectomy   . Right ovary removal   . Left hemicolectomy with splenic flexure mobilization and end colostomy  04/04/2012  . Laparotomy 04/04/2012    Procedure: EXPLORATORY LAPAROTOMY;  Surgeon: Romie Levee, MD;  Location: [facility] ORS;  Service: General;  Laterality: N/A;  left colectomy  . Colostomy 04/04/2012    Procedure: COLOSTOMY;  Surgeon: Romie Levee, MD;  Location: [facility];  Service: General;  Laterality: N/A;    Medications: Prior to Admission medications   Medication Sig Start Date End Date Taking? Authorizing Provider  amoxicillin-clavulanate (AUGMENTIN) 875-125 MG per tablet Take 1 tablet by mouth every 12 (twelve) hours. 04/16/12  Yes Letha Cape, PA  ARIPiprazole (ABILIFY) 2 MG tablet Take 2 mg by mouth daily.   Yes Historical Provider, MD  aspirin EC 81 MG tablet Take 81 mg by mouth daily.   Yes Historical Provider, MD  cetirizine (ZYRTEC) 10 MG tablet Take 10 mg by mouth at bedtime.    Yes Historical Provider, MD  clonazePAM (KLONOPIN) 0.5 MG tablet Take 0.5 mg by mouth 3 (three) times daily as needed. For anxiety/rest   Yes Historical Provider, MD  escitalopram (LEXAPRO) 20 MG tablet Take 30 mg by mouth daily.   Yes Historical Provider, MD  Ferrous Gluconate (IRON) 240 (27 FE) MG TABS Take 1 tablet by mouth daily.    Yes Historical Provider, MD  hydrochlorothiazide (HYDRODIURIL) 25 MG tablet Take 25 mg by mouth daily.  03/01/12  Yes Historical Provider, MD  hydroxychloroquine (PLAQUENIL) 200 MG tablet Take 200 mg by mouth 2 (two) times daily.  08/04/11  Yes Historical Provider, MD  morphine (MS CONTIN) 60 MG 12 hr tablet Take 60 mg by mouth  2 (two) times daily as needed. For pain. 01/22/12  Yes Historical Provider, MD  omeprazole (PRILOSEC) 40 MG capsule Take 40 mg by mouth daily.   Yes Historical Provider, MD  ondansetron (ZOFRAN) 4 MG tablet Take 4 mg by mouth every 8 (eight) hours as needed. For nausea. 03/15/12  Yes Historical Provider, MD  oxyCODONE-acetaminophen (PERCOCET/ROXICET) 5-325 MG per tablet Take 1-2 tablets by mouth every 4 (four) hours as needed. For pain.   Yes Historical  Provider, MD  polyethylene glycol (MIRALAX / GLYCOLAX) packet Take 17 g by mouth daily.   Yes Historical Provider, MD  traZODone (DESYREL) 100 MG tablet Take 200 mg by mouth at bedtime.   Yes Historical Provider, MD    Allergies:   Allergies  Allergen Reactions  . Pregabalin Rash    Alters Mental status, flushing  . Saphris (Asenapine Maleate) Shortness Of Breath and Other (See Comments)    Lips tingle and tongue becomes numb/Akasthesia  . Iodinated Diagnostic Agents     Dye used for CT scans, severe hives  . Seroquel (Quetiapine Fumarate)     Social History:  reports that she has never smoked. She has never used smokeless tobacco. She reports that she does not drink alcohol or use illicit drugs. currently at [facility] nursing home, uses a walker  Family History: Family History  Problem Relation Age of Onset  . Depression Mother   . Depression Father   . Alcohol abuse Brother   . Depression Brother   . Depression Maternal Grandfather   . Depression Other     Physical Exam: Filed Vitals:   04/28/12 1624 04/28/12 2138  BP: 113/54 139/64  Pulse: 88 90  Temp: 98.4 F (36.9 C) 98.7 F (37.1 C)  TempSrc: Oral Oral  Resp: 18 18  SpO2: 92% 93%    General:  Alert and oriented times three, obese , no acute distress Eyes: PERRLA, pink conjunctiva, no scleral icterus ENT: Moist oral mucosa, neck supple, no thyromegaly Lungs: clear to ascultation, no wheeze, no crackles, no use of accessory muscles Cardiovascular: regular rate and rhythm, no regurgitation, no gallops, no murmurs. No carotid bruits, no JVD Abdomen: soft, positive BS, non-tender, non-distended, no organomegaly, not an acute abdomen, colostomy left upper quadrant, wound VAC and midline surgical scar mildly erythematous. GU: not examined Neuro: CN II - XII grossly intact, sensation intact Musculoskeletal: strength 5/5 all extremities, no clubbing, cyanosis or edema Skin: no rash, no subcutaneous crepitation,  no decubitus Psych: appropriate patient   Labs on Admission:   [facility] 04/28/12 1820  NA 135  K 4.2  CL 96  CO2 29  GLUCOSE 132*  BUN 11  CREATININE 0.90  CALCIUM 8.3*  MG --  PHOS --    Basename 04/28/12 1820  AST 26  ALT 25  ALKPHOS 470*  BILITOT 0.1*  PROT 6.6  ALBUMIN 1.9*   No results found for this basename: LIPASE:2,AMYLASE:2 in the last 72 hours  Basename 04/28/12 1820  WBC 7.8  NEUTROABS 5.1  HGB 9.2*  HCT 30.5*  MCV 83.1  PLT 521*   No results found for this basename: CKTOTAL:3,CKMB:3,CKMBINDEX:3,TROPONINI:3 in the last 72 hours No components found with this basename: POCBNP:3 No results found for this basename: DDIMER:2 in the last 72 hours No results found for this basename: HGBA1C:2 in the last 72 hours No results found for this basename: CHOL:2,HDL:2,LDLCALC:2,TRIG:2,CHOLHDL:2,LDLDIRECT:2 in the last 72 hours No results found for this basename: TSH,T4TOTAL,FREET3,T3FREE,THYROIDAB in the last 72 hours No results found  for this basename: VITAMINB12:2,FOLATE:2,FERRITIN:2,TIBC:2,IRON:2,RETICCTPCT:2 in the last 72 hours  Micro Results: No results found for this or any previous visit (from the past 240 hour(s)). Results for VERENA, SHAWGO (MRN [id number]) as of 04/28/2012 23:05  Ref. Range 04/28/2012 19:58  Color, Urine Latest Range: YELLOW  YELLOW  APPearance Latest Range: CLEAR  CLOUDY (A)  Specific Gravity, Urine Latest Range: 1.005-1.030  1.010  pH Latest Range: 5.0-8.0  6.5  Glucose Latest Range: NEGATIVE mg/dL NEGATIVE  Bilirubin Urine Latest Range: NEGATIVE  NEGATIVE  Ketones, ur Latest Range: NEGATIVE mg/dL NEGATIVE  Protein Latest Range: NEGATIVE mg/dL NEGATIVE  Urobilinogen, UA Latest Range: 0.0-1.0 mg/dL 0.2  Nitrite Latest Range: NEGATIVE  NEGATIVE  Leukocytes, UA Latest Range: NEGATIVE  NEGATIVE  Hgb urine dipstick Latest Range: NEGATIVE  NEGATIVE    Radiological Exams on Admission: Ct Abdomen Pelvis Wo Contrast  04/28/2012   *RADIOLOGY REPORT*  Clinical Data: Fever, abdominal pain.  CT ABDOMEN AND PELVIS WITHOUT CONTRAST  Technique:  Multidetector CT imaging of the abdomen and pelvis was performed following the standard protocol without intravenous contrast.  Comparison: 04/28/2012 radiograph, 04/15/2012 CT  Findings: Fluid collection in the right azygoesophageal recess is similar to prior.  See separate chest CT report.  Organ abnormality/lesion detection is limited in the absence of intravenous contrast. Within this limitation, heterogeneous attenuation of the liver may reflect areas of fatty infiltration. Unremarkable spleen.  Fatty atrophy of the pancreas.  Unremarkable adrenal glands.  Unchanged appearance to the kidneys.  Mild perinephric fat stranding.  No hydronephrosis or hydroureter.  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 There are locules of extraluminal gas adjacent to the Hartmann pouch at its proximal end (adjacent to suture material).  A tract of gas and stranding extends to the level of the anterior abdominal wall, in communication with an open right lower quadrant anterior abdominal wall wound.  No bowel obstruction.  There is ingested contrast within small bowel loops.  Peritoneal nodular thickening is most pronounced within the left lateral abdomen. No loculated/drainable fluid collection.  Normal caliber aorta.  Further vascular evaluation not possible without intravenous contrast.  No overt lymphadenopathy.  Decompressed bladder.  Absent uterus.  No adnexal mass.  Multilevel degenerative changes of the imaged spine. No acute or aggressive appearing osseous lesion.  IMPRESSION: Status post partial colectomy with left paramidline colostomy.  No bowel obstruction. No abscess.  Interval development of extra luminal locules of gas adjacent to the proximal end of the Hartmann pouch.  These gas locules and surrounding mesenteric fat stranding extend anteriorly, in the indication with the open  right anterior abdominal wall wound.  Peritoneal nodular thickening is most pronounced within the left lateral abdomen. Presumably, this corresponds to reactive changes in the setting of recent perforated diverticulitis.  Recommend continued attention on follow-up examinations to document resolution.  Discussed via telephone with Dr. Silverio Lay at 09:50 p.m. on 04/28/2012.   Original Report Authenticated By: Waneta Martins, M.D.    Ct Chest Wo Contrast  04/28/2012  *RADIOLOGY REPORT*  Clinical Data: Fever  CT CHEST WITHOUT CONTRAST  Technique:  Multidetector CT imaging of the chest was performed following the standard protocol without IV contrast.  Comparison: 10/27/2011 chest CT  Findings: Central airways are patent.  Mild lingular opacity and middle lobe opacity.  No pleural effusion or pneumothorax.  Azygoesophageal recess fluid collection is similar to prior abdominal CT.  Heart size within normal limits.  Trace pericardial fluid.  No pleural effusions.  Normal caliber aorta.  Further vascular evaluation of possible without intravenous contrast.  No intrathoracic lymphadenopathy.  T11 sclerotic focus is unchanged from prior. Bilateral glenohumeral degenerative changes and deformity of the right humeral neck, incompletely imaged.  See separate abdominal CT report.  IMPRESSION: Mild lingular and middle lobe opacities, favor scarring or atelectasis.  Azygoesophageal recess fluid collection is similar to prior abdominal CT.   Original Report Authenticated By: Waneta Martins, M.D.    Dg Abd Acute W/chest  04/28/2012  *RADIOLOGY REPORT*  Clinical Data: Nausea, fever, abdominal pain  ACUTE ABDOMEN SERIES (ABDOMEN 2 VIEW & CHEST 1 VIEW)  Comparison: Chest radiograph 04/07/2012  Findings: Enlargement of cardiac silhouette with pulmonary vascular congestion. Lungs clear. Bones demineralized with bilateral glenohumeral degenerative changes noted. Surgical clips right upper quadrant question cholecystectomy.  Nonobstructive bowel gas pattern. No definite bowel dilatation or bowel wall thickening. Large radiopacity is seen in the right pelvis, 14.6 x 9.9 cm, question ostomy bag. No acute osseous findings or definite urinary tract calcification. No free intraperitoneal air.  IMPRESSION: Large radiopacity in the right pelvis, question ostomy bag. Enlargement of cardiac silhouette with pulmonary vascular congestion. Otherwise negative exam.   Original Report Authenticated By: Lollie Marrow, M.D.     Assessment/Plan Present on Admission:  Question wound infection  Appears to be more likely cause. Surgery on board Dr. Dwain Sarna. Zosyn started and vancomycin.  Woundcare consulted  . possible Healthcare-associated pneumonia  Patient symptoms are equivocal. Patient on antibiotics  .Hypertension .Fibromyalgia .Obesity .Other and unspecified hyperlipidemia .GERD (gastroesophageal reflux disease) .Diabetes mellitus- borderline Stable home medications renewed    Full code DVT prophylaxis   Haya Hemler 04/28/2012, 11:01 PM

## 2012-04-28 NOTE — ED Notes (Signed)
ZOX:[id number]<AV> Expected date:04/28/12<BR> Expected time: 3:57 PM<BR> Means of arrival:Ambulance<BR> Comments:<BR> Fever/abd cramping

## 2012-04-28 NOTE — Consult Note (Signed)
Reason for Consult:s/p hartmans with cough, ab pain Referring Physician: Dr Silverio Lay  Jasmine Huff is an 56 y.o. female.  HPI: 33 yof who underwent left colectomy/hartmans for stercoral perforation.  She had long hospital course and was discharged to snf about 10 days ago she says. She was eating but her appetite is now a little worse.  She has some abdominal pain. Her colostomy has been working just fine also.  She had a temperature of 101 at her snf and was sent to er.  She complains of a cough also.  I was asked to see her after ct scan was performed.  Past Medical History  Diagnosis Date  . Arthritis   . Depression   . Allergy   . Hypertension   . Migraine   . UTI (lower urinary tract infection)   . Fibromyalgia   . Chronic pain syndrome   . GERD (gastroesophageal reflux disease) 01/21/2011  . Anxiety   . Panic attacks   . High blood pressure   . Knee pain   . Urination frequency   . Diabetes mellitus type II   . Perforation of colon 04/08/2012    Past Surgical History  Procedure Date  . Cholecystectomy   . Abdominal hysterectomy   . Right ovary removal   . Left hemicolectomy with splenic flexure mobilization and end colostomy 04/04/2012  . Laparotomy 04/04/2012    Procedure: EXPLORATORY LAPAROTOMY;  Surgeon: Romie Levee, MD;  Location: [facility];  Service: General;  Laterality: N/A;  left colectomy  . Colostomy 04/04/2012    Procedure: COLOSTOMY;  Surgeon: Romie Levee, MD;  Location: [facility];  Service: General;  Laterality: N/A;    Family History  Problem Relation Age of Onset  . Depression Mother   . Depression Father   . Alcohol abuse Brother   . Depression Brother   . Depression Maternal Grandfather   . Depression Other     Social History:  reports that she has never smoked. She has never used smokeless tobacco. She reports that she does not drink alcohol or use illicit drugs.  Allergies:  Allergies  Allergen Reactions  . Pregabalin Rash    Alters Mental status,  flushing  . Saphris (Asenapine Maleate) Shortness Of Breath and Other (See Comments)    Lips tingle and tongue becomes numb/Akasthesia  . Iodinated Diagnostic Agents     Dye used for CT scans, severe hives  . Seroquel (Quetiapine Fumarate)     Medications: I have reviewed the patient's current medications.  Results for orders placed during the hospital encounter of 04/28/12 (from the past 48 hour(s))  CBC WITH DIFFERENTIAL     Status: Abnormal   Collection Time   04/28/12  6:20 PM      Component Value Range Comment   WBC 7.8  4.0 - 10.5 K/uL WHITE COUNT CONFIRMED ON SMEAR   RBC 3.67 (*) 3.87 - 5.11 MIL/uL    Hemoglobin 9.2 (*) 12.0 - 15.0 g/dL    HCT 11.9 (*) 14.7 - 46.0 %    MCV 83.1  78.0 - 100.0 fL    MCH 25.1 (*) 26.0 - 34.0 pg    MCHC 30.2  30.0 - 36.0 g/dL    RDW 82.9 (*) 56.2 - 15.5 %    Platelets 521 (*) 150 - 400 K/uL PLATELET COUNT CONFIRMED BY SMEAR   Neutrophils Relative 66  43 - 77 %    Lymphocytes Relative 18  12 - 46 %    Monocytes Relative  13 (*) 3 - 12 %    Eosinophils Relative 2  0 - 5 %    Basophils Relative 1  0 - 1 %    Neutro Abs 5.1  1.7 - 7.7 K/uL    Lymphs Abs 1.4  0.7 - 4.0 K/uL    Monocytes Absolute 1.0  0.1 - 1.0 K/uL    Eosinophils Absolute 0.2  0.0 - 0.7 K/uL    Basophils Absolute 0.1  0.0 - 0.1 K/uL   COMPREHENSIVE METABOLIC PANEL     Status: Abnormal   Collection Time   04/28/12  6:20 PM      Component Value Range Comment   Sodium 135  135 - 145 mEq/L    Potassium 4.2  3.5 - 5.1 mEq/L    Chloride 96  96 - 112 mEq/L    CO2 29  19 - 32 mEq/L    Glucose, Bld 132 (*) 70 - 99 mg/dL    BUN 11  6 - 23 mg/dL    Creatinine, Ser 1.61  0.50 - 1.10 mg/dL    Calcium 8.3 (*) 8.4 - 10.5 mg/dL    Total Protein 6.6  6.0 - 8.3 g/dL    Albumin 1.9 (*) 3.5 - 5.2 g/dL    AST 26  0 - 37 U/L    ALT 25  0 - 35 U/L    Alkaline Phosphatase 470 (*) 39 - 117 U/L    Total Bilirubin 0.1 (*) 0.3 - 1.2 mg/dL    GFR calc non Af Amer 70 (*) >90 mL/min    GFR calc Af  Amer 81 (*) >90 mL/min   LACTIC ACID, PLASMA     Status: Normal   Collection Time   04/28/12  6:20 PM      Component Value Range Comment   Lactic Acid, Venous 1.1  0.5 - 2.2 mmol/L   URINALYSIS, ROUTINE W REFLEX MICROSCOPIC     Status: Abnormal   Collection Time   04/28/12  7:58 PM      Component Value Range Comment   Color, Urine YELLOW  YELLOW    APPearance CLOUDY (*) CLEAR    Specific Gravity, Urine 1.010  1.005 - 1.030    pH 6.5  5.0 - 8.0    Glucose, UA NEGATIVE  NEGATIVE mg/dL    Hgb urine dipstick NEGATIVE  NEGATIVE    Bilirubin Urine NEGATIVE  NEGATIVE    Ketones, ur NEGATIVE  NEGATIVE mg/dL    Protein, ur NEGATIVE  NEGATIVE mg/dL    Urobilinogen, UA 0.2  0.0 - 1.0 mg/dL    Nitrite NEGATIVE  NEGATIVE    Leukocytes, UA NEGATIVE  NEGATIVE MICROSCOPIC NOT DONE ON URINES WITH NEGATIVE PROTEIN, BLOOD, LEUKOCYTES, NITRITE, OR GLUCOSE <1000 mg/dL.    Ct Abdomen Pelvis Wo Contrast  04/28/2012  *RADIOLOGY REPORT*  Clinical Data: Fever, abdominal pain.  CT ABDOMEN AND PELVIS WITHOUT CONTRAST  Technique:  Multidetector CT imaging of the abdomen and pelvis was performed following the standard protocol without intravenous contrast.  Comparison: 04/28/2012 radiograph, 04/15/2012 CT  Findings: Fluid collection in the right azygoesophageal recess is similar to prior.  See separate chest CT report.  Organ abnormality/lesion detection is limited in the absence of intravenous contrast. Within this limitation, heterogeneous attenuation of the liver may reflect areas of fatty infiltration. Unremarkable spleen.  Fatty atrophy of the pancreas.  Unremarkable adrenal glands.  Unchanged appearance to the kidneys.  Mild perinephric fat stranding.  No hydronephrosis or hydroureter.  Status  post partial colectomy with left lower abdomen colostomy. There are locules of extraluminal gas adjacent to the Hartmann pouch at its proximal end (adjacent to suture material).  A tract of gas and stranding extends to the  level of the anterior abdominal wall, in communication with an open right lower quadrant anterior abdominal wall wound.  No bowel obstruction.  There is ingested contrast within small bowel loops.  Peritoneal nodular thickening is most pronounced within the left lateral abdomen. No loculated/drainable fluid collection.  Normal caliber aorta.  Further vascular evaluation not possible without intravenous contrast.  No overt lymphadenopathy.  Decompressed bladder.  Absent uterus.  No adnexal mass.  Multilevel degenerative changes of the imaged spine. No acute or aggressive appearing osseous lesion.  IMPRESSION: Status post partial colectomy with left paramidline colostomy.  No bowel obstruction. No abscess.  Interval development of extra luminal locules of gas adjacent to the proximal end of the Hartmann pouch.  These gas locules and surrounding mesenteric fat stranding extend anteriorly, in the indication with the open right anterior abdominal wall wound.  Peritoneal nodular thickening is most pronounced within the left lateral abdomen. Presumably, this corresponds to reactive changes in the setting of recent perforated diverticulitis.  Recommend continued attention on follow-up examinations to document resolution.  Discussed via telephone with Dr. Silverio Lay at 09:50 p.m. on 04/28/2012.   Original Report Authenticated By: Waneta Martins, M.D.    Ct Chest Wo Contrast  04/28/2012  *RADIOLOGY REPORT*  Clinical Data: Fever  CT CHEST WITHOUT CONTRAST  Technique:  Multidetector CT imaging of the chest was performed following the standard protocol without IV contrast.  Comparison: 10/27/2011 chest CT  Findings: Central airways are patent.  Mild lingular opacity and middle lobe opacity.  No pleural effusion or pneumothorax.  Azygoesophageal recess fluid collection is similar to prior abdominal CT.  Heart size within normal limits.  Trace pericardial fluid.  No pleural effusions.  Normal caliber aorta.  Further vascular  evaluation of possible without intravenous contrast.  No intrathoracic lymphadenopathy.  T11 sclerotic focus is unchanged from prior. Bilateral glenohumeral degenerative changes and deformity of the right humeral neck, incompletely imaged.  See separate abdominal CT report.  IMPRESSION: Mild lingular and middle lobe opacities, favor scarring or atelectasis.  Azygoesophageal recess fluid collection is similar to prior abdominal CT.   Original Report Authenticated By: Waneta Martins, M.D.    Dg Abd Acute W/chest  04/28/2012  *RADIOLOGY REPORT*  Clinical Data: Nausea, fever, abdominal pain  ACUTE ABDOMEN SERIES (ABDOMEN 2 VIEW & CHEST 1 VIEW)  Comparison: Chest radiograph 04/07/2012  Findings: Enlargement of cardiac silhouette with pulmonary vascular congestion. Lungs clear. Bones demineralized with bilateral glenohumeral degenerative changes noted. Surgical clips right upper quadrant question cholecystectomy. Nonobstructive bowel gas pattern. No definite bowel dilatation or bowel wall thickening. Large radiopacity is seen in the right pelvis, 14.6 x 9.9 cm, question ostomy bag. No acute osseous findings or definite urinary tract calcification. No free intraperitoneal air.  IMPRESSION: Large radiopacity in the right pelvis, question ostomy bag. Enlargement of cardiac silhouette with pulmonary vascular congestion. Otherwise negative exam.   Original Report Authenticated By: Lollie Marrow, M.D.     Review of Systems  Constitutional: Positive for fever.  Gastrointestinal: Positive for abdominal pain. Negative for nausea and vomiting.   Blood pressure 139/64, pulse 90, temperature 98.7 F (37.1 C), temperature source Oral, resp. rate 18, SpO2 93.00%. Physical Exam  Vitals reviewed. Constitutional: She appears well-developed.  GI: Soft. Bowel sounds are normal.  There is no tenderness.      Assessment/Plan: Possible PNA Abnl ct scan with extraluminal air near hartmanns  Will follow and hopefully  will resolve with abx, she is going to be admitted for pna tx and would need coverage for intra-abdominal organisms like zosyn or cipro/flagyl.  She can have a diet from my standpoint also and we will just continue abx possibly repeat ct scan at some point.  She should have vac changed tomorrow also.  Layla Kesling 04/28/2012, 10:39 PM

## 2012-04-28 NOTE — ED Notes (Signed)
Per EMS pt in from [facility] c/o fever and abd pain/cramping. Per EMS pt has wound vac to abd.

## 2012-04-28 NOTE — ED Notes (Signed)
[place], MD at bedside 

## 2012-04-28 NOTE — ED Notes (Signed)
Patient tried to give urine sample. At hat was placed in toilet, but patient missed the hat. Will try to get another sample

## 2012-04-28 NOTE — ED Provider Notes (Signed)
History     CSN: [id number]  Arrival date & time 04/28/12  1604   First MD Initiated Contact with Patient 04/28/12 1751      Chief Complaint  Patient presents with  . Fever  . Abdominal Pain    (Consider location/radiation/quality/duration/timing/severity/associated sxs/prior treatment) The history is provided by the patient.  Jasmine Huff is a 55 y.o. female history of depression, hypertension, recent perforated diverticulitis status post colostomy and a wound VAC here presenting with fever and abdominal pain and constipation. Constipated her last 2 days no bowel movement through the colostomy bag until this afternoon. She had a low-grade temperature yesterday and a febrile 101 today. She also had diffuse abdominal pain. She felt that this is similar to her perforation 3 weeks ago. Feels nauseous but denies any vomiting. She also has been having some productive cough but denies any dysuria. She is currently on Augmentin.   Past Medical History  Diagnosis Date  . Arthritis   . Depression   . Allergy   . Hypertension   . Migraine   . UTI (lower urinary tract infection)   . Fibromyalgia   . Chronic pain syndrome   . GERD (gastroesophageal reflux disease) 01/21/2011  . Anxiety   . Panic attacks   . High blood pressure   . Knee pain   . Urination frequency   . Diabetes mellitus type II   . Perforation of colon 04/08/2012    Past Surgical History  Procedure Date  . Cholecystectomy   . Abdominal hysterectomy   . Right ovary removal   . Left hemicolectomy with splenic flexure mobilization and end colostomy 04/04/2012  . Laparotomy 04/04/2012    Procedure: EXPLORATORY LAPAROTOMY;  Surgeon: Romie Levee, MD;  Location: [facility];  Service: General;  Laterality: N/A;  left colectomy  . Colostomy 04/04/2012    Procedure: COLOSTOMY;  Surgeon: Romie Levee, MD;  Location: [facility];  Service: General;  Laterality: N/A;    Family History  Problem Relation Age of Onset  . Depression  Mother   . Depression Father   . Alcohol abuse Brother   . Depression Brother   . Depression Maternal Grandfather   . Depression Other     History  Substance Use Topics  . Smoking status: Never Smoker   . Smokeless tobacco: Never Used  . Alcohol Use: No    OB History    Grav Para Term Preterm Abortions TAB SAB Ect Mult Living                  Review of Systems  Respiratory: Positive for cough.   Gastrointestinal: Positive for nausea, abdominal pain and constipation.  All other systems reviewed and are negative.    Allergies  Pregabalin; Saphris; Iodinated diagnostic agents; and Seroquel  Home Medications   Current Outpatient Rx  Name Route Sig Dispense Refill  . AMOXICILLIN-POT CLAVULANATE 875-125 MG PO TABS Oral Take 1 tablet by mouth every 12 (twelve) hours. 14 tablet 0  . ARIPIPRAZOLE 2 MG PO TABS Oral Take 2 mg by mouth daily.    . ASPIRIN EC 81 MG PO TBEC Oral Take 81 mg by mouth daily.    Marland Kitchen CETIRIZINE HCL 10 MG PO TABS Oral Take 10 mg by mouth at bedtime.     Marland Kitchen CLONAZEPAM 0.5 MG PO TABS Oral Take 0.5 mg by mouth 3 (three) times daily as needed. For anxiety/rest    . ESCITALOPRAM OXALATE 20 MG PO TABS Oral Take 30 mg by  mouth daily.    . IRON 240 (27 FE) MG PO TABS Oral Take 1 tablet by mouth daily.     Marland Kitchen HYDROCHLOROTHIAZIDE 25 MG PO TABS Oral Take 25 mg by mouth daily.     Marland Kitchen HYDROXYCHLOROQUINE SULFATE 200 MG PO TABS Oral Take 200 mg by mouth 2 (two) times daily.     . MORPHINE SULFATE ER 60 MG PO TBCR Oral Take 60 mg by mouth 2 (two) times daily as needed. For pain.    Marland Kitchen OMEPRAZOLE 40 MG PO CPDR Oral Take 40 mg by mouth daily.    Marland Kitchen ONDANSETRON HCL 4 MG PO TABS Oral Take 4 mg by mouth every 8 (eight) hours as needed. For nausea.    . OXYCODONE-ACETAMINOPHEN 5-325 MG PO TABS Oral Take 1-2 tablets by mouth every 4 (four) hours as needed. For pain.    Marland Kitchen POLYETHYLENE GLYCOL 3350 PO PACK Oral Take 17 g by mouth daily.    . TRAZODONE HCL 100 MG PO TABS Oral Take 200 mg  by mouth at bedtime.      BP 139/64  Pulse 90  Temp 98.7 F (37.1 C) (Oral)  Resp 18  SpO2 93%  Physical Exam  Nursing note and vitals reviewed. Constitutional: She is oriented to person, place, and time. She appears well-developed and well-nourished.       Uncomfortable   HENT:  Head: Normocephalic.  Mouth/Throat: Oropharynx is clear and moist.  Eyes: Conjunctivae normal are normal. Pupils are equal, round, and reactive to light.  Neck: Normal range of motion. Neck supple.  Cardiovascular: Normal rate, regular rhythm and normal heart sounds.   Pulmonary/Chest: Effort normal and breath sounds normal. No respiratory distress. She has no wheezes.       Decreased breath sound bilateral bases.   Abdominal:         Colostomy (circle in the middle) with stool inside. There is wound vac on RLQ (black circles). There is minimal surrounding cellulitis and no tenderness around the site. Otherwise, +BS, diffuse tenderness, + distension.   Musculoskeletal: Normal range of motion.  Neurological: She is alert and oriented to person, place, and time.  Skin: Skin is warm and dry.  Psychiatric: She has a normal mood and affect. Her behavior is normal. Judgment and thought content normal.    ED Course  Procedures (including critical care time)  Labs Reviewed  CBC WITH DIFFERENTIAL - Abnormal; Notable for the following:    RBC 3.67 (*)     Hemoglobin 9.2 (*)     HCT 30.5 (*)     MCH 25.1 (*)     RDW 16.5 (*)     Platelets 521 (*)  PLATELET COUNT CONFIRMED BY SMEAR   Monocytes Relative 13 (*)     All other components within normal limits  COMPREHENSIVE METABOLIC PANEL - Abnormal; Notable for the following:    Glucose, Bld 132 (*)     Calcium 8.3 (*)     Albumin 1.9 (*)     Alkaline Phosphatase 470 (*)     Total Bilirubin 0.1 (*)     GFR calc non Af Amer 70 (*)     GFR calc Af Amer 81 (*)     All other components within normal limits  URINALYSIS, ROUTINE W REFLEX MICROSCOPIC -  Abnormal; Notable for the following:    APPearance CLOUDY (*)     All other components within normal limits  LACTIC ACID, PLASMA  CULTURE, BLOOD (ROUTINE X 2)  CULTURE, BLOOD (ROUTINE X 2)  URINE CULTURE   Ct Abdomen Pelvis Wo Contrast  04/28/2012  *RADIOLOGY REPORT*  Clinical Data: Fever, abdominal pain.  CT ABDOMEN AND PELVIS WITHOUT CONTRAST  Technique:  Multidetector CT imaging of the abdomen and pelvis was performed following the standard protocol without intravenous contrast.  Comparison: 04/28/2012 radiograph, 04/15/2012 CT  Findings: Fluid collection in the right azygoesophageal recess is similar to prior.  See separate chest CT report.  Organ abnormality/lesion detection is limited in the absence of intravenous contrast. Within this limitation, heterogeneous attenuation of the liver may reflect areas of fatty infiltration. Unremarkable spleen.  Fatty atrophy of the pancreas.  Unremarkable adrenal glands.  Unchanged appearance to the kidneys.  Mild perinephric fat stranding.  No hydronephrosis or hydroureter.  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 There are locules of extraluminal gas adjacent to the Hartmann pouch at its proximal end (adjacent to suture material).  A tract of gas and stranding extends to the level of the anterior abdominal wall, in communication with an open right lower quadrant anterior abdominal wall wound.  No bowel obstruction.  There is ingested contrast within small bowel loops.  Peritoneal nodular thickening is most pronounced within the left lateral abdomen. No loculated/drainable fluid collection.  Normal caliber aorta.  Further vascular evaluation not possible without intravenous contrast.  No overt lymphadenopathy.  Decompressed bladder.  Absent uterus.  No adnexal mass.  Multilevel degenerative changes of the imaged spine. No acute or aggressive appearing osseous lesion.  IMPRESSION: Status post partial colectomy with left paramidline  colostomy.  No bowel obstruction. No abscess.  Interval development of extra luminal locules of gas adjacent to the proximal end of the Hartmann pouch.  These gas locules and surrounding mesenteric fat stranding extend anteriorly, in the indication with the open right anterior abdominal wall wound.  Peritoneal nodular thickening is most pronounced within the left lateral abdomen. Presumably, this corresponds to reactive changes in the setting of recent perforated diverticulitis.  Recommend continued attention on follow-up examinations to document resolution.  Discussed via telephone with Dr. Silverio Lay at 09:50 p.m. on 04/28/2012.   Original Report Authenticated By: Waneta Martins, M.D.    Ct Chest Wo Contrast  04/28/2012  *RADIOLOGY REPORT*  Clinical Data: Fever  CT CHEST WITHOUT CONTRAST  Technique:  Multidetector CT imaging of the chest was performed following the standard protocol without IV contrast.  Comparison: 10/27/2011 chest CT  Findings: Central airways are patent.  Mild lingular opacity and middle lobe opacity.  No pleural effusion or pneumothorax.  Azygoesophageal recess fluid collection is similar to prior abdominal CT.  Heart size within normal limits.  Trace pericardial fluid.  No pleural effusions.  Normal caliber aorta.  Further vascular evaluation of possible without intravenous contrast.  No intrathoracic lymphadenopathy.  T11 sclerotic focus is unchanged from prior. Bilateral glenohumeral degenerative changes and deformity of the right humeral neck, incompletely imaged.  See separate abdominal CT report.  IMPRESSION: Mild lingular and middle lobe opacities, favor scarring or atelectasis.  Azygoesophageal recess fluid collection is similar to prior abdominal CT.   Original Report Authenticated By: Waneta Martins, M.D.    Dg Abd Acute W/chest  04/28/2012  *RADIOLOGY REPORT*  Clinical Data: Nausea, fever, abdominal pain  ACUTE ABDOMEN SERIES (ABDOMEN 2 VIEW & CHEST 1 VIEW)  Comparison:  Chest radiograph 04/07/2012  Findings: Enlargement of cardiac silhouette with pulmonary vascular congestion. Lungs clear. Bones demineralized with bilateral glenohumeral degenerative changes noted. Surgical clips right upper  quadrant question cholecystectomy. Nonobstructive bowel gas pattern. No definite bowel dilatation or bowel wall thickening. Large radiopacity is seen in the right pelvis, 14.6 x 9.9 cm, question ostomy bag. No acute osseous findings or definite urinary tract calcification. No free intraperitoneal air.  IMPRESSION: Large radiopacity in the right pelvis, question ostomy bag. Enlargement of cardiac silhouette with pulmonary vascular congestion. Otherwise negative exam.   Original Report Authenticated By: Lollie Marrow, M.D.      1. Hospital acquired PNA   2. Open abdominal wall wound       MDM  Shakeia Krus is a 56 y.o. female hx of perforated diverticulitis here with abdominal pain, fever. Will need to r/o perforation. Also consider abscesses. Will do sepsis work up plus CT ab/pel. Will reassess.   10:32 PM CT showed possible pneumonia and increased air around colostomy. Labs unremarkable. I discussed with surgery, Dr. Dwain Sarna, who recommend IV abx and he will see patient. I think she has health care associated pneumonia despite being on PO abx. Will admit for IV abx.        Richardean Canal, MD 04/28/12 2235

## 2012-04-28 NOTE — ED Notes (Signed)
Patient transported to CT 

## 2012-04-29 DIAGNOSIS — R918 Other nonspecific abnormal finding of lung field: Secondary | ICD-10-CM | POA: Diagnosis not present

## 2012-04-29 DIAGNOSIS — F411 Generalized anxiety disorder: Secondary | ICD-10-CM | POA: Diagnosis present

## 2012-04-29 DIAGNOSIS — I10 Essential (primary) hypertension: Secondary | ICD-10-CM | POA: Diagnosis not present

## 2012-04-29 DIAGNOSIS — F3289 Other specified depressive episodes: Secondary | ICD-10-CM | POA: Diagnosis not present

## 2012-04-29 DIAGNOSIS — E119 Type 2 diabetes mellitus without complications: Secondary | ICD-10-CM | POA: Diagnosis not present

## 2012-04-29 DIAGNOSIS — E785 Hyperlipidemia, unspecified: Secondary | ICD-10-CM | POA: Diagnosis present

## 2012-04-29 DIAGNOSIS — T07XXXA Unspecified multiple injuries, initial encounter: Secondary | ICD-10-CM | POA: Diagnosis not present

## 2012-04-29 DIAGNOSIS — K219 Gastro-esophageal reflux disease without esophagitis: Secondary | ICD-10-CM | POA: Diagnosis not present

## 2012-04-29 DIAGNOSIS — S31109A Unspecified open wound of abdominal wall, unspecified quadrant without penetration into peritoneal cavity, initial encounter: Secondary | ICD-10-CM | POA: Diagnosis not present

## 2012-04-29 DIAGNOSIS — J189 Pneumonia, unspecified organism: Secondary | ICD-10-CM | POA: Diagnosis not present

## 2012-04-29 DIAGNOSIS — Z9049 Acquired absence of other specified parts of digestive tract: Secondary | ICD-10-CM | POA: Diagnosis not present

## 2012-04-29 DIAGNOSIS — R509 Fever, unspecified: Secondary | ICD-10-CM | POA: Diagnosis not present

## 2012-04-29 DIAGNOSIS — B370 Candidal stomatitis: Secondary | ICD-10-CM | POA: Diagnosis not present

## 2012-04-29 DIAGNOSIS — G4700 Insomnia, unspecified: Secondary | ICD-10-CM | POA: Diagnosis present

## 2012-04-29 DIAGNOSIS — F329 Major depressive disorder, single episode, unspecified: Secondary | ICD-10-CM

## 2012-04-29 DIAGNOSIS — M129 Arthropathy, unspecified: Secondary | ICD-10-CM | POA: Diagnosis not present

## 2012-04-29 DIAGNOSIS — Z933 Colostomy status: Secondary | ICD-10-CM | POA: Diagnosis not present

## 2012-04-29 DIAGNOSIS — R0989 Other specified symptoms and signs involving the circulatory and respiratory systems: Secondary | ICD-10-CM | POA: Diagnosis not present

## 2012-04-29 DIAGNOSIS — R109 Unspecified abdominal pain: Secondary | ICD-10-CM | POA: Diagnosis not present

## 2012-04-29 DIAGNOSIS — IMO0001 Reserved for inherently not codable concepts without codable children: Secondary | ICD-10-CM | POA: Diagnosis present

## 2012-04-29 DIAGNOSIS — J9819 Other pulmonary collapse: Secondary | ICD-10-CM | POA: Diagnosis present

## 2012-04-29 DIAGNOSIS — E46 Unspecified protein-calorie malnutrition: Secondary | ICD-10-CM | POA: Diagnosis present

## 2012-04-29 DIAGNOSIS — E669 Obesity, unspecified: Secondary | ICD-10-CM | POA: Diagnosis present

## 2012-04-29 DIAGNOSIS — L0390 Cellulitis, unspecified: Secondary | ICD-10-CM | POA: Diagnosis not present

## 2012-04-29 DIAGNOSIS — E860 Dehydration: Secondary | ICD-10-CM | POA: Diagnosis not present

## 2012-04-29 DIAGNOSIS — Z6841 Body Mass Index (BMI) 40.0 and over, adult: Secondary | ICD-10-CM | POA: Diagnosis not present

## 2012-04-29 DIAGNOSIS — A419 Sepsis, unspecified organism: Secondary | ICD-10-CM | POA: Diagnosis not present

## 2012-04-29 DIAGNOSIS — Z5189 Encounter for other specified aftercare: Secondary | ICD-10-CM | POA: Diagnosis not present

## 2012-04-29 LAB — CBC
HCT: 25.7 % — ABNORMAL LOW (ref 36.0–46.0)
MCHC: 31.5 g/dL (ref 30.0–36.0)
RDW: 16.4 % — ABNORMAL HIGH (ref 11.5–15.5)

## 2012-04-29 LAB — BASIC METABOLIC PANEL
BUN: 10 mg/dL (ref 6–23)
Calcium: 7.7 mg/dL — ABNORMAL LOW (ref 8.4–10.5)
Chloride: 100 mEq/L (ref 96–112)
Creatinine, Ser: 0.87 mg/dL (ref 0.50–1.10)
GFR calc Af Amer: 85 mL/min — ABNORMAL LOW (ref >90)
GFR calc non Af Amer: 73 mL/min — ABNORMAL LOW (ref >90)

## 2012-04-29 MED ORDER — ACETAMINOPHEN 650 MG RE SUPP: 650.0000 mg | Freq: Four times a day (QID) | RECTAL | Status: DC | PRN

## 2012-04-29 MED ORDER — MORPHINE SULFATE 2 MG/ML IJ SOLN
2.0000 mg | INTRAMUSCULAR | Status: DC | PRN
  Administered 2012-04-29 – 2012-04-30 (×3): 2 mg via INTRAVENOUS
  Filled 2012-04-29 (×4): qty 1

## 2012-04-29 MED ORDER — ESCITALOPRAM OXALATE 20 MG PO TABS
30.0000 mg | ORAL_TABLET | Freq: Every day | ORAL | Status: DC
  Administered 2012-04-29 – 2012-04-30 (×2): 30 mg via ORAL
  Filled 2012-04-29 (×2): qty 1

## 2012-04-29 MED ORDER — HYDROXYCHLOROQUINE SULFATE 200 MG PO TABS
200.0000 mg | ORAL_TABLET | Freq: Two times a day (BID) | ORAL | Status: DC
  Administered 2012-04-29 – 2012-04-30 (×4): 200 mg via ORAL
  Filled 2012-04-29 (×5): qty 1

## 2012-04-29 MED ORDER — ENOXAPARIN SODIUM 80 MG/0.8ML ~~LOC~~ SOLN
70.0000 mg | SUBCUTANEOUS | Status: DC
  Administered 2012-04-29 – 2012-04-30 (×2): 70 mg via SUBCUTANEOUS
  Filled 2012-04-29 (×2): qty 0.8

## 2012-04-29 MED ORDER — VANCOMYCIN HCL 1000 MG IV SOLR
1500.0000 mg | Freq: Once | INTRAVENOUS | Status: AC
  Administered 2012-04-29: 1500 mg via INTRAVENOUS
  Filled 2012-04-29: qty 1500

## 2012-04-29 MED ORDER — POLYETHYLENE GLYCOL 3350 17 G PO PACK
17.0000 g | PACK | Freq: Every day | ORAL | Status: DC
  Administered 2012-04-29 – 2012-04-30 (×2): 17 g via ORAL
  Filled 2012-04-29 (×2): qty 1

## 2012-04-29 MED ORDER — ENSURE COMPLETE PO LIQD
237.0000 mL | Freq: Two times a day (BID) | ORAL | Status: DC
  Administered 2012-04-29 – 2012-04-30 (×3): 237 mL via ORAL

## 2012-04-29 MED ORDER — SENNOSIDES-DOCUSATE SODIUM 8.6-50 MG PO TABS
1.0000 | ORAL_TABLET | Freq: Every evening | ORAL | Status: DC | PRN
  Filled 2012-04-29: qty 1

## 2012-04-29 MED ORDER — ACETAMINOPHEN 325 MG PO TABS: 650.0000 mg | ORAL_TABLET | Freq: Four times a day (QID) | ORAL | Status: DC | PRN

## 2012-04-29 MED ORDER — ZOLPIDEM TARTRATE 5 MG PO TABS: 5.0000 mg | ORAL_TABLET | Freq: Every evening | ORAL | Status: DC | PRN

## 2012-04-29 MED ORDER — SODIUM CHLORIDE 0.9 % IJ SOLN: 3.0000 mL | INTRAMUSCULAR | Status: DC | PRN

## 2012-04-29 MED ORDER — TRAZODONE HCL 100 MG PO TABS
200.0000 mg | ORAL_TABLET | Freq: Every day | ORAL | Status: DC
  Administered 2012-04-29 (×2): 200 mg via ORAL
  Filled 2012-04-29 (×3): qty 2

## 2012-04-29 MED ORDER — ONDANSETRON HCL 4 MG PO TABS: 4.0000 mg | ORAL_TABLET | Freq: Four times a day (QID) | ORAL | Status: DC | PRN

## 2012-04-29 MED ORDER — ONDANSETRON HCL 4 MG/2ML IJ SOLN
4.0000 mg | Freq: Four times a day (QID) | INTRAMUSCULAR | Status: DC | PRN
  Administered 2012-04-29 (×2): 4 mg via INTRAVENOUS
  Filled 2012-04-29: qty 4
  Filled 2012-04-29 (×2): qty 2

## 2012-04-29 MED ORDER — MORPHINE SULFATE 2 MG/ML IJ SOLN
INTRAMUSCULAR | Status: AC
  Administered 2012-04-29: 2 mg via INTRAVENOUS
  Filled 2012-04-29: qty 1

## 2012-04-29 MED ORDER — CLONAZEPAM 0.5 MG PO TABS
0.5000 mg | ORAL_TABLET | Freq: Three times a day (TID) | ORAL | Status: DC | PRN
  Administered 2012-04-29 – 2012-04-30 (×3): 0.5 mg via ORAL
  Filled 2012-04-29 (×3): qty 1

## 2012-04-29 MED ORDER — PIPERACILLIN-TAZOBACTAM 3.375 G IVPB
3.3750 g | Freq: Three times a day (TID) | INTRAVENOUS | Status: DC
  Administered 2012-04-29 – 2012-04-30 (×5): 3.375 g via INTRAVENOUS
  Filled 2012-04-29 (×7): qty 50

## 2012-04-29 MED ORDER — ALUM & MAG HYDROXIDE-SIMETH 200-200-20 MG/5ML PO SUSP: 30.0000 mL | Freq: Four times a day (QID) | ORAL | Status: DC | PRN

## 2012-04-29 MED ORDER — VANCOMYCIN HCL 1000 MG IV SOLR
1250.0000 mg | Freq: Two times a day (BID) | INTRAVENOUS | Status: DC
  Administered 2012-04-29 – 2012-04-30 (×3): 1250 mg via INTRAVENOUS
  Filled 2012-04-29 (×3): qty 1250

## 2012-04-29 MED ORDER — HYDROCHLOROTHIAZIDE 25 MG PO TABS
25.0000 mg | ORAL_TABLET | Freq: Every day | ORAL | Status: DC
  Administered 2012-04-29 – 2012-04-30 (×2): 25 mg via ORAL
  Filled 2012-04-29 (×2): qty 1

## 2012-04-29 MED ORDER — OXYCODONE-ACETAMINOPHEN 5-325 MG PO TABS
1.0000 | ORAL_TABLET | ORAL | Status: DC | PRN
  Administered 2012-04-29 – 2012-04-30 (×9): 2 via ORAL
  Filled 2012-04-29 (×9): qty 2

## 2012-04-29 MED ORDER — PANTOPRAZOLE SODIUM 40 MG PO TBEC
40.0000 mg | DELAYED_RELEASE_TABLET | Freq: Every day | ORAL | Status: DC
  Administered 2012-04-29 – 2012-04-30 (×2): 40 mg via ORAL
  Filled 2012-04-29 (×2): qty 1

## 2012-04-29 MED ORDER — LORATADINE 10 MG PO TABS
10.0000 mg | ORAL_TABLET | Freq: Every day | ORAL | Status: DC
  Administered 2012-04-29 – 2012-04-30 (×2): 10 mg via ORAL
  Filled 2012-04-29 (×2): qty 1

## 2012-04-29 MED ORDER — ASPIRIN EC 81 MG PO TBEC
81.0000 mg | DELAYED_RELEASE_TABLET | Freq: Every day | ORAL | Status: DC
  Administered 2012-04-29 – 2012-04-30 (×2): 81 mg via ORAL
  Filled 2012-04-29 (×2): qty 1

## 2012-04-29 MED ORDER — ENOXAPARIN SODIUM 30 MG/0.3ML ~~LOC~~ SOLN: 30.0000 mg | SUBCUTANEOUS | Status: DC

## 2012-04-29 NOTE — Progress Notes (Signed)
Initial review for inpatient status is complete. 

## 2012-04-29 NOTE — Progress Notes (Signed)
Patient ID: Jasmine Huff, female   DOB: 1956-01-03, 56 y.o.   MRN: [id number]    Subjective: 56 y/o female c/o feeling hot and tired, but otherwise okay.  Pt first denied abdominal pain, but had pain with palpation.  Pt had nausea which is now well controlled.  She is tolerating carb control diet.  Pt is OOB ambulating to BR.  Pt denies any chest pain, SOB, or leg tenderness.  Pt just had her wound vac changed today.    Objective: Vital signs in last 24 hours: Temp:  [98.3 F (36.8 C)-99.5 F (37.5 C)] 98.4 F (36.9 C) (10/24 0520) Pulse Rate:  [88-100] 89  (10/24 0520) Resp:  [18-20] 20  (10/24 0520) BP: (112-167)/(50-70) 112/50 mmHg (10/24 0520) SpO2:  [91 %-95 %] 91 % (10/24 0520) Weight:  [294 lb 15.6 oz (133.8 kg)] 294 lb 15.6 oz (133.8 kg) (10/24 0700) Last BM Date: 04/29/12  Intake/Output from previous day: 10/23 0701 - 10/24 0700 In: 240 [P.O.:240] Out: 650 [Urine:150; Stool:500] Intake/Output this shift: Total I/O In: 240 [P.O.:240] Out: -   PE: Gen:  Alert, NAD Card:  RRR, no murmurs Pulm:  CTA, no WRR Abd: soft, TTP around wound vac, ND, new wound vac in place, areas of erythema around wound vac appear to be reaction from the tegaderms, ostomy patent Ext:  No erythema, edema or tenderness  Lab Results:   [facility] 04/29/12 0641 04/28/12 1820  WBC 6.5 7.8  HGB 8.1* 9.2*  HCT 25.7* 30.5*  PLT 499* 521*   BMET  Basename 04/29/12 0641 04/28/12 1820  NA 138 135  K 3.9 4.2  CL 100 96  CO2 29 29  GLUCOSE 165* 132*  BUN 10 11  CREATININE 0.87 0.90  CALCIUM 7.7* 8.3*   PT/INR No results found for this basename: LABPROT:2,INR:2 in the last 72 hours CMP     Component Value Date/Time   NA 138 04/29/2012 0641   K 3.9 04/29/2012 0641   CL 100 04/29/2012 0641   CO2 29 04/29/2012 0641   GLUCOSE 165* 04/29/2012 0641   BUN 10 04/29/2012 0641   CREATININE 0.87 04/29/2012 0641   CREATININE 0.79 10/21/2011 0925   CALCIUM 7.7* 04/29/2012 0641   PROT 6.6 04/28/2012  1820   ALBUMIN 1.9* 04/28/2012 1820   AST 26 04/28/2012 1820   ALT 25 04/28/2012 1820   ALKPHOS 470* 04/28/2012 1820   BILITOT 0.1* 04/28/2012 1820   GFRNONAA 73* 04/29/2012 0641   GFRAA 85* 04/29/2012 0641   Lipase  No results found for this basename: lipase       Studies/Results: Ct Abdomen Pelvis Wo Contrast  04/28/2012  *RADIOLOGY REPORT*  Clinical Data: Fever, abdominal pain.  CT ABDOMEN AND PELVIS WITHOUT CONTRAST  Technique:  Multidetector CT imaging of the abdomen and pelvis was performed following the standard protocol without intravenous contrast.  Comparison: 04/28/2012 radiograph, 04/15/2012 CT  Findings: Fluid collection in the right azygoesophageal recess is similar to prior.  See separate chest CT report.  Organ abnormality/lesion detection is limited in the absence of intravenous contrast. Within this limitation, heterogeneous attenuation of the liver may reflect areas of fatty infiltration. Unremarkable spleen.  Fatty atrophy of the pancreas.  Unremarkable adrenal glands.  Unchanged appearance to the kidneys.  Mild perinephric fat stranding.  No hydronephrosis or hydroureter.  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 There are locules of extraluminal gas adjacent to the Hartmann pouch at its proximal end (adjacent to suture material).  A  tract of gas and stranding extends to the level of the anterior abdominal wall, in communication with an open right lower quadrant anterior abdominal wall wound.  No bowel obstruction.  There is ingested contrast within small bowel loops.  Peritoneal nodular thickening is most pronounced within the left lateral abdomen. No loculated/drainable fluid collection.  Normal caliber aorta.  Further vascular evaluation not possible without intravenous contrast.  No overt lymphadenopathy.  Decompressed bladder.  Absent uterus.  No adnexal mass.  Multilevel degenerative changes of the imaged spine. No acute or aggressive appearing  osseous lesion.  IMPRESSION: Status post partial colectomy with left paramidline colostomy.  No bowel obstruction. No abscess.  Interval development of extra luminal locules of gas adjacent to the proximal end of the Hartmann pouch.  These gas locules and surrounding mesenteric fat stranding extend anteriorly, in the indication with the open right anterior abdominal wall wound.  Peritoneal nodular thickening is most pronounced within the left lateral abdomen. Presumably, this corresponds to reactive changes in the setting of recent perforated diverticulitis.  Recommend continued attention on follow-up examinations to document resolution.  Discussed via telephone with Dr. Silverio Lay at 09:50 p.m. on 04/28/2012.   Original Report Authenticated By: Waneta Martins, M.D.    Ct Chest Wo Contrast  04/28/2012  *RADIOLOGY REPORT*  Clinical Data: Fever  CT CHEST WITHOUT CONTRAST  Technique:  Multidetector CT imaging of the chest was performed following the standard protocol without IV contrast.  Comparison: 10/27/2011 chest CT  Findings: Central airways are patent.  Mild lingular opacity and middle lobe opacity.  No pleural effusion or pneumothorax.  Azygoesophageal recess fluid collection is similar to prior abdominal CT.  Heart size within normal limits.  Trace pericardial fluid.  No pleural effusions.  Normal caliber aorta.  Further vascular evaluation of possible without intravenous contrast.  No intrathoracic lymphadenopathy.  T11 sclerotic focus is unchanged from prior. Bilateral glenohumeral degenerative changes and deformity of the right humeral neck, incompletely imaged.  See separate abdominal CT report.  IMPRESSION: Mild lingular and middle lobe opacities, favor scarring or atelectasis.  Azygoesophageal recess fluid collection is similar to prior abdominal CT.   Original Report Authenticated By: Waneta Martins, M.D.    Dg Abd Acute W/chest  04/28/2012  *RADIOLOGY REPORT*  Clinical Data: Nausea, fever,  abdominal pain  ACUTE ABDOMEN SERIES (ABDOMEN 2 VIEW & CHEST 1 VIEW)  Comparison: Chest radiograph 04/07/2012  Findings: Enlargement of cardiac silhouette with pulmonary vascular congestion. Lungs clear. Bones demineralized with bilateral glenohumeral degenerative changes noted. Surgical clips right upper quadrant question cholecystectomy. Nonobstructive bowel gas pattern. No definite bowel dilatation or bowel wall thickening. Large radiopacity is seen in the right pelvis, 14.6 x 9.9 cm, question ostomy bag. No acute osseous findings or definite urinary tract calcification. No free intraperitoneal air.  IMPRESSION: Large radiopacity in the right pelvis, question ostomy bag. Enlargement of cardiac silhouette with pulmonary vascular congestion. Otherwise negative exam.   Original Report Authenticated By: Lollie Marrow, M.D.     Anti-infectives: Anti-infectives     Start     Dose/Rate Route Frequency Ordered Stop   04/29/12 1400   vancomycin (VANCOCIN) 1,250 mg in sodium chloride 0.9 % 250 mL IVPB        1,250 mg 166.7 mL/hr over 90 Minutes Intravenous Every 12 hours 04/29/12 0126     04/29/12 0600  piperacillin-tazobactam (ZOSYN) IVPB 3.375 g       3.375 g 12.5 mL/hr over 240 Minutes Intravenous 3 times per day  04/29/12 0047     04/29/12 0200   vancomycin (VANCOCIN) 1,500 mg in sodium chloride 0.9 % 500 mL IVPB        1,500 mg 250 mL/hr over 120 Minutes Intravenous  Once 04/29/12 0126 04/29/12 0449   04/29/12 0115   hydroxychloroquine (PLAQUENIL) tablet 200 mg        200 mg Oral 2 times daily 04/29/12 0047     04/28/12 2200   vancomycin (VANCOCIN) IVPB 1000 mg/200 mL premix        1,000 mg 200 mL/hr over 60 Minutes Intravenous  Once 04/28/12 2155 04/29/12 0016   04/28/12 2200  piperacillin-tazobactam (ZOSYN) IVPB 3.375 g       3.375 g 100 mL/hr over 30 Minutes Intravenous  Once 04/28/12 2155 04/28/12 2253           Assessment/Plan   56 y/o female Post-op on 04/04/2012 for Left  hemicolectomy with splenic flexure mobilization and end colostomy, now returned for fever, nausea and anorexia 1.  DD:  Wound infection or Pneumonia?-no clear cause, blood cultures and urine cultures pending 2.  Cont. Antibiotics 3.  Ambulate OOB 4.  Incentive spiro 5.  If improving can go home soon    LOS: 1 day    Aris Georgia 04/29/2012, 12:58 PM Pager: [phone]

## 2012-04-29 NOTE — Progress Notes (Signed)
Chaplain responded to spiritual care consult along with Rutherford Nail, second-year chaplain resident.  Provided emotional and spiritual support and prayed with pt.   Pt lying in bed.  Friend / member of church congregation at bedside.  Pt reported that church family functions as primary means of support.  Reported little support from family.  Pt had been living with brother prior to hospitalization, but brother has informed pt that she is not welcome to move back in with him, as he cannot care for her.   Pt tearful when speaking about brother,but somewhat guarded in front of visitor - continually affirming "I'm ok".  Chaplain provided support around feelings of loneliness and frustration.  Prayed with pt at pt request.    Will continue to follow for support during admission.   Belva Crome  MDiv, Chaplain

## 2012-04-29 NOTE — Consult Note (Signed)
WOC consult Note Reason for Consult:midline abdominal wound with NPWT Wound type:surgical Pressure Ulcer POA: No Measurement:19.5 x 7.5 x 10cm  Wound RUE:AVWUJ, pink, moist Drainage (amount, consistency, odor) serosanguinous Periwound:intact.  Few periwound areas of partial thickness denudement from adhesive removal. Dressing procedure/placement/frequency: NPWT/VAC therapy using black granufoam wound contact layer.  Today I used two pieces of foam.  Continuous suction at initiated without complaint.  Tolerated well. Dressing change schedule is to be Thursday and Saturday this week, then patient will return to M-W-F schedule for changes.  Floor staff to change V.A.C. Dressings and measure wound on Wednesdays. I will not follow.  Please re-consult if needed. Thanks, Ladona Mow, MSN, RN, [facility], CWOCN [phone]).  WOC ostomy consult  Stoma type/location: LUQ COlostomy Stomal assessment/size: 1 and 3/8 inch budded stoma with mucocutaneous separation from 2-4 o'clock Peristomal assessment: intact, clear.  Some remnants of ostomy paste in parastomal field; I recommend we do not use this, but instead, use a skin barrier ring. Treatment options for stomal/peristomal skin: none indicated.  I attempted to remove that residual paste from skin, but know that remainder will come off with next pouching system change.  Barrier ring used instead of paste. Output soft brown stool Ostomy pouching: 1pc., cut-to-fit pouching system, Lawson#[id number] used with a skin barrier ring, [facility] [place] [phone]  Education provided: Patient resides in a SNF and is not going to be independent at this time in her ostomy care.  She does not empty or change her ostomy pouch, relying on staff to do this for her.  Orders provided for staff to care for ostomy pouching system. I will not follow.  Please re-consult if needed. Thanks, Ladona Mow, MSN, RN, [facility], CWOCN [phone])

## 2012-04-29 NOTE — Progress Notes (Signed)
INITIAL ADULT NUTRITION ASSESSMENT Date: 04/29/2012   Time: 1:10 PM Reason for Assessment: Nutrition risk   INTERVENTION: Ordered snacks per pt preference. Will monitor intake.   ASSESSMENT: Female 56 y.o.  Dx: Fever, abdominal pain, cramping  Food/Nutrition Related Hx: Pt known to RD from previous admission, was discharged 04/16/12. Pt reports since then she has only been consuming 50% of meals r/t ongoing nausea. Pt not interested in nutritional supplements, states she wants to eat "the natural way". Noted pt consumed 100% of breakfast. Pt's weight down 8 pounds since the end of last month.   Hx:  Past Medical History  Diagnosis Date  . Arthritis   . Depression   . Allergy   . Hypertension   . Migraine   . UTI (lower urinary tract infection)   . Fibromyalgia   . Chronic pain syndrome   . GERD (gastroesophageal reflux disease) 01/21/2011  . Anxiety   . Panic attacks   . High blood pressure   . Knee pain   . Urination frequency   . Diabetes mellitus type II   . Perforation of colon 04/08/2012   Related Meds:  Scheduled Meds:   . aspirin EC  81 mg Oral Daily  . enoxaparin (LOVENOX) injection  70 mg Subcutaneous Q24H  . escitalopram  30 mg Oral Daily  . feeding supplement  237 mL Oral BID BM  . hydrochlorothiazide  25 mg Oral Daily  . hydroxychloroquine  200 mg Oral BID  . loratadine  10 mg Oral Daily  . morphine      .  morphine injection  8 mg Intravenous Once  .  morphine injection  8 mg Intravenous Once  . pantoprazole  40 mg Oral Daily  . piperacillin-tazobactam  3.375 g Intravenous Once  . piperacillin-tazobactam (ZOSYN)  IV  3.375 g Intravenous Q8H  . polyethylene glycol  17 g Oral Daily  . sodium chloride  1,000 mL Intravenous Once  . traZODone  200 mg Oral QHS  . vancomycin  1,250 mg Intravenous Q12H  . vancomycin  1,500 mg Intravenous Once  . vancomycin  1,000 mg Intravenous Once  . DISCONTD: enoxaparin (LOVENOX) injection  30 mg Subcutaneous Q24H    Continuous Infusions:  PRN Meds:.acetaminophen, acetaminophen, alum & mag hydroxide-simeth, clonazePAM, morphine injection, ondansetron (ZOFRAN) IV, ondansetron, oxyCODONE-acetaminophen, senna-docusate, sodium chloride, zolpidem  Ht: 5\' 3"  (160 cm)  Wt: 294 lb 15.6 oz (133.8 kg)  Ideal Wt: 115 lb % Ideal Wt: 255  Usual Wt: 302 lb in September 2013 % Usual Wt: 97  Body mass index is 52.25 kg/(m^2). Class III extreme obesity  Labs:  CMP     Component Value Date/Time   NA 138 04/29/2012 0641   K 3.9 04/29/2012 0641   CL 100 04/29/2012 0641   CO2 29 04/29/2012 0641   GLUCOSE 165* 04/29/2012 0641   BUN 10 04/29/2012 0641   CREATININE 0.87 04/29/2012 0641   CREATININE 0.79 10/21/2011 0925   CALCIUM 7.7* 04/29/2012 0641   PROT 6.6 04/28/2012 1820   ALBUMIN 1.9* 04/28/2012 1820   AST 26 04/28/2012 1820   ALT 25 04/28/2012 1820   ALKPHOS 470* 04/28/2012 1820   BILITOT 0.1* 04/28/2012 1820   GFRNONAA 73* 04/29/2012 0641   GFRAA 85* 04/29/2012 0641   CBG (last 3)  No results found for this basename: GLUCAP:3 in the last 72 hours   Intake/Output Summary (Last 24 hours) at 04/29/12 1315 Last data filed at 04/29/12 0748  Gross per 24 hour  Intake    480 ml  Output    650 ml  Net   -170 ml   Last BM - 10/24  Diet Order: Carb Control   IVF:    Estimated Nutritional Needs:   Kcal:1850-1950 Protein:95-105g Fluid:1.8-1.9L  NUTRITION DIAGNOSIS: -Predicted suboptimal energy intake (NI-1.6).  Status: Ongoing  RELATED TO: nausea PTA  AS EVIDENCE BY: pt statement  MONITORING/EVALUATION(Goals): 1. Resolution of nausea 2. Pt to consume >90% of meals/snacks  EDUCATION NEEDS: -No education needs identified at this time   Dietitian #: [phone]  DOCUMENTATION CODES Per approved criteria  -Morbid Obesity    Marshall Cork 04/29/2012, 1:10 PM

## 2012-04-29 NOTE — Progress Notes (Signed)
ANTIBIOTIC CONSULT NOTE - INITIAL  Pharmacy Consult for Vancomycin Indication: HAP  Allergies  Allergen Reactions  . Pregabalin Rash    Alters Mental status, flushing  . Saphris (Asenapine Maleate) Shortness Of Breath and Other (See Comments)    Lips tingle and tongue becomes numb/Akasthesia  . Iodinated Diagnostic Agents     Dye used for CT scans, severe hives  . Seroquel (Quetiapine Fumarate)     Patient Measurements:  Wt=134.9 kg   Vital Signs: Temp: 99.5 F (37.5 C) (10/24 0038) Temp src: Oral (10/24 0038) BP: 167/70 mmHg (10/24 0038) Pulse Rate: 100  (10/24 0038) Intake/Output from previous day: 10/23 0701 - 10/24 0700 In: 240 [P.O.:240] Out: -  Intake/Output from this shift: Total I/O In: 240 [P.O.:240] Out: -   Labs:  Basename 04/28/12 1820  WBC 7.8  HGB 9.2*  PLT 521*  LABCREA --  CREATININE 0.90   The CrCl is unknown because both a height and weight (above a minimum accepted value) are required for this calculation. No results found for this basename: VANCOTROUGH:2,VANCOPEAK:2,VANCORANDOM:2,GENTTROUGH:2,GENTPEAK:2,GENTRANDOM:2,TOBRATROUGH:2,TOBRAPEAK:2,TOBRARND:2,AMIKACINPEAK:2,AMIKACINTROU:2,AMIKACIN:2, in the last 72 hours   Microbiology: Recent Results (from the past 720 hour(s))  MRSA PCR SCREENING     Status: Normal   Collection Time   04/04/12 10:13 PM      Component Value Range Status Comment   MRSA by PCR NEGATIVE  NEGATIVE Final   CULTURE, BLOOD (ROUTINE X 2)     Status: Normal   Collection Time   04/05/12  1:39 AM      Component Value Range Status Comment   Specimen Description Blood   Final    Special Requests Normal   Final    Culture  Setup Time 04/05/2012 17:41   Final    Culture NO GROWTH 5 DAYS   Final    Report Status 04/11/2012 FINAL   Final   CULTURE, BLOOD (ROUTINE X 2)     Status: Normal   Collection Time   04/05/12  1:39 AM      Component Value Range Status Comment   Specimen Description Blood   Final    Special Requests  Normal   Final    Culture  Setup Time 04/05/2012 17:41   Final    Culture NO GROWTH 5 DAYS   Final    Report Status 04/11/2012 FINAL   Final   URINE CULTURE     Status: Normal   Collection Time   04/06/12  9:33 AM      Component Value Range Status Comment   Specimen Description URINE, CATHETERIZED   Final    Special Requests NONE   Final    Culture  Setup Time 04/07/2012 01:00   Final    Colony Count NO GROWTH   Final    Culture NO GROWTH   Final    Report Status 04/07/2012 FINAL   Final     Medical History: Past Medical History  Diagnosis Date  . Arthritis   . Depression   . Allergy   . Hypertension   . Migraine   . UTI (lower urinary tract infection)   . Fibromyalgia   . Chronic pain syndrome   . GERD (gastroesophageal reflux disease) 01/21/2011  . Anxiety   . Panic attacks   . High blood pressure   . Knee pain   . Urination frequency   . Diabetes mellitus type II   . Perforation of colon 04/08/2012    Medications:  Scheduled:    . aspirin EC  81 mg Oral Daily  . enoxaparin (LOVENOX) injection  70 mg Subcutaneous Q24H  . escitalopram  30 mg Oral Daily  . hydrochlorothiazide  25 mg Oral Daily  . hydroxychloroquine  200 mg Oral BID  . loratadine  10 mg Oral Daily  . morphine      .  morphine injection  8 mg Intravenous Once  .  morphine injection  8 mg Intravenous Once  . pantoprazole  40 mg Oral Daily  . piperacillin-tazobactam  3.375 g Intravenous Once  . piperacillin-tazobactam (ZOSYN)  IV  3.375 g Intravenous Q8H  . polyethylene glycol  17 g Oral Daily  . sodium chloride  1,000 mL Intravenous Once  . traZODone  200 mg Oral QHS  . vancomycin  1,000 mg Intravenous Once  . DISCONTD: enoxaparin (LOVENOX) injection  30 mg Subcutaneous Q24H   Infusions:   Assessment: 56 yo pt with recent admission for perforated colon, now c/o abdomnial cramps, fever, pt has possible HAP.  MD ordering Vancomycin per RX.  MD dosing Zosyn.  Goal of Therapy:  Vancomycin trough  level 15-20 mcg/ml  Plan:   Vancomycin 1500mg  x1 now = 2500mg  load. (1gm given in ER ~2315)  Vancomycin 1250mg  IV q12h.  CrCl~79 (N)  F/U SCr/levels as needed.  Jasmine Huff R 04/29/2012,1:15 AM

## 2012-04-29 NOTE — Progress Notes (Signed)
[facility] PROGRESS NOTE  Dominik Lauricella XBJ:[id number] DOB: 06-23-1956 DOA: 04/28/2012 PCP: [name], MD  Assessment/Plan: 1. 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 WBC's WNL as well. Will repeat CXR 2 views in am to follow on atelectasis changes and will also start patient on incentive spirometry. Meanwhile continue vanc and zosyn. 2. GERD: continue ppi 3. S/p colostomy with ?? Abdominal wound infection: per surgery team; continue antibiotics and also ostomy care; no surgical interventions anticipated at this point. 4. HTN: stable and well controlled. Continue current regimen. 5. Protein calorie malnutrition: due to poor intake and recent surgery. Decrease appetite. Will start patient on ensure. 6. Depression and insomnia: continue trazodone 7. Anxiety: continue PRN klonopin.  DVT: lovenox  Code Status: Full Family Communication: no family at bedside Disposition Plan: back to SNF at discharge when medically stable   Consultants:  CCS  Antibiotics:  vanc and zosyn (for possible HCA-PNA) 04/28/12   HPI/Subjective: Afebrile, no SOB, no CP. Patient reports mild discomfort on her abdomen; no nausea, no vomiting.  Objective: Filed Vitals:   04/28/12 2317 04/29/12 0038 04/29/12 0520 04/29/12 0700  BP: 122/52 167/70 112/50   Pulse: 88 100 89   Temp: 98.3 F (36.8 C) 99.5 F (37.5 C) 98.4 F (36.9 C)   TempSrc: Oral Oral Oral   Resp: 18 20 20    Height:    5\' 3"  (1.6 m)  Weight:    133.8 kg (294 lb 15.6 oz)  SpO2: 95% 94% 91%     Intake/Output Summary (Last 24 hours) at 04/29/12 1159 Last data filed at 04/29/12 0748  Gross per 24 hour  Intake    480 ml  Output    650 ml  Net   -170 ml   Filed Weights   04/29/12 0700  Weight: 133.8 kg (294 lb 15.6 oz)    Exam:   General: NAD; feeling better; afebrile  Cardiovascular: RRR, no rubs or gallops  Respiratory: CTA, no wheezing, no  crackles  Abdomen: soft, ND, positive BS, ostomy bag with stool inside  Extremities: no edema, no cyanosis  Neuro: non focal   Data Reviewed: Basic Metabolic Panel:  Lab 04/29/12 3086 04/28/12 1820  NA 138 135  K 3.9 4.2  CL 100 96  CO2 29 29  GLUCOSE 165* 132*  BUN 10 11  CREATININE 0.87 0.90  CALCIUM 7.7* 8.3*  MG -- --  PHOS -- --   Liver Function Tests:  Lab 04/28/12 1820  AST 26  ALT 25  ALKPHOS 470*  BILITOT 0.1*  PROT 6.6  ALBUMIN 1.9*   CBC:  Lab 04/29/12 0641 04/28/12 1820  WBC 6.5 7.8  NEUTROABS -- 5.1  HGB 8.1* 9.2*  HCT 25.7* 30.5*  MCV 82.6 83.1  PLT 499* 521*   BNP (last 3 results)  Basename 04/07/12 0350 02/23/12 1323  PROBNP 260.8* 1049.0*    Recent Results (from the past 240 hour(s))  CULTURE, BLOOD (ROUTINE X 2)     Status: Normal (Preliminary result)   Collection Time   04/28/12  6:20 PM      Component Value Range Status Comment   Specimen Description BLOOD LEFT HAND   Final    Special Requests BOTTLES DRAWN AEROBIC AND ANAEROBIC 5CC   Final    Culture  Setup Time 04/28/2012 23:20   Final    Culture     Final    Value:  BLOOD CULTURE RECEIVED NO GROWTH TO DATE CULTURE WILL BE HELD FOR 5 DAYS BEFORE ISSUING A FINAL NEGATIVE REPORT   Report Status PENDING   Incomplete   CULTURE, BLOOD (ROUTINE X 2)     Status: Normal (Preliminary result)   Collection Time   04/28/12  6:20 PM      Component Value Range Status Comment   Specimen Description BLOOD LEFT ARM   Final    Special Requests BOTTLES DRAWN AEROBIC AND ANAEROBIC 2CC   Final    Culture  Setup Time 04/28/2012 23:20   Final    Culture     Final    Value:        BLOOD CULTURE RECEIVED NO GROWTH TO DATE CULTURE WILL BE HELD FOR 5 DAYS BEFORE ISSUING A FINAL NEGATIVE REPORT   Report Status PENDING   Incomplete      Studies: Ct Abdomen Pelvis Wo Contrast  04/28/2012  *RADIOLOGY REPORT*  Clinical Data: Fever, abdominal pain.  CT ABDOMEN AND PELVIS WITHOUT CONTRAST   Technique:  Multidetector CT imaging of the abdomen and pelvis was performed following the standard protocol without intravenous contrast.  Comparison: 04/28/2012 radiograph, 04/15/2012 CT  Findings: Fluid collection in the right azygoesophageal recess is similar to prior.  See separate chest CT report.  Organ abnormality/lesion detection is limited in the absence of intravenous contrast. Within this limitation, heterogeneous attenuation of the liver may reflect areas of fatty infiltration. Unremarkable spleen.  Fatty atrophy of the pancreas.  Unremarkable adrenal glands.  Unchanged appearance to the kidneys.  Mild perinephric fat stranding.  No hydronephrosis or hydroureter.  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 There are locules of extraluminal gas adjacent to the Hartmann pouch at its proximal end (adjacent to suture material).  A tract of gas and stranding extends to the level of the anterior abdominal wall, in communication with an open right lower quadrant anterior abdominal wall wound.  No bowel obstruction.  There is ingested contrast within small bowel loops.  Peritoneal nodular thickening is most pronounced within the left lateral abdomen. No loculated/drainable fluid collection.  Normal caliber aorta.  Further vascular evaluation not possible without intravenous contrast.  No overt lymphadenopathy.  Decompressed bladder.  Absent uterus.  No adnexal mass.  Multilevel degenerative changes of the imaged spine. No acute or aggressive appearing osseous lesion.  IMPRESSION: Status post partial colectomy with left paramidline colostomy.  No bowel obstruction. No abscess.  Interval development of extra luminal locules of gas adjacent to the proximal end of the Hartmann pouch.  These gas locules and surrounding mesenteric fat stranding extend anteriorly, in the indication with the open right anterior abdominal wall wound.  Peritoneal nodular thickening is most pronounced within the left  lateral abdomen. Presumably, this corresponds to reactive changes in the setting of recent perforated diverticulitis.  Recommend continued attention on follow-up examinations to document resolution.  Discussed via telephone with Dr. Silverio Lay at 09:50 p.m. on 04/28/2012.   Original Report Authenticated By: Waneta Martins, M.D.    Ct Chest Wo Contrast  04/28/2012  *RADIOLOGY REPORT*  Clinical Data: Fever  CT CHEST WITHOUT CONTRAST  Technique:  Multidetector CT imaging of the chest was performed following the standard protocol without IV contrast.  Comparison: 10/27/2011 chest CT  Findings: Central airways are patent.  Mild lingular opacity and middle lobe opacity.  No pleural effusion or pneumothorax.  Azygoesophageal recess fluid collection is similar to prior abdominal CT.  Heart size within normal limits.  Trace pericardial fluid.  No pleural effusions.  Normal caliber aorta.  Further vascular evaluation of possible without intravenous contrast.  No intrathoracic lymphadenopathy.  T11 sclerotic focus is unchanged from prior. Bilateral glenohumeral degenerative changes and deformity of the right humeral neck, incompletely imaged.  See separate abdominal CT report.  IMPRESSION: Mild lingular and middle lobe opacities, favor scarring or atelectasis.  Azygoesophageal recess fluid collection is similar to prior abdominal CT.   Original Report Authenticated By: Waneta Martins, M.D.    Dg Abd Acute W/chest  04/28/2012  *RADIOLOGY REPORT*  Clinical Data: Nausea, fever, abdominal pain  ACUTE ABDOMEN SERIES (ABDOMEN 2 VIEW & CHEST 1 VIEW)  Comparison: Chest radiograph 04/07/2012  Findings: Enlargement of cardiac silhouette with pulmonary vascular congestion. Lungs clear. Bones demineralized with bilateral glenohumeral degenerative changes noted. Surgical clips right upper quadrant question cholecystectomy. Nonobstructive bowel gas pattern. No definite bowel dilatation or bowel wall thickening. Large radiopacity is  seen in the right pelvis, 14.6 x 9.9 cm, question ostomy bag. No acute osseous findings or definite urinary tract calcification. No free intraperitoneal air.  IMPRESSION: Large radiopacity in the right pelvis, question ostomy bag. Enlargement of cardiac silhouette with pulmonary vascular congestion. Otherwise negative exam.   Original Report Authenticated By: Lollie Marrow, M.D.     Scheduled Meds:   . aspirin EC  81 mg Oral Daily  . enoxaparin (LOVENOX) injection  70 mg Subcutaneous Q24H  . escitalopram  30 mg Oral Daily  . hydrochlorothiazide  25 mg Oral Daily  . hydroxychloroquine  200 mg Oral BID  . loratadine  10 mg Oral Daily  . morphine      .  morphine injection  8 mg Intravenous Once  .  morphine injection  8 mg Intravenous Once  . pantoprazole  40 mg Oral Daily  . piperacillin-tazobactam  3.375 g Intravenous Once  . piperacillin-tazobactam (ZOSYN)  IV  3.375 g Intravenous Q8H  . polyethylene glycol  17 g Oral Daily  . sodium chloride  1,000 mL Intravenous Once  . traZODone  200 mg Oral QHS  . vancomycin  1,250 mg Intravenous Q12H  . vancomycin  1,500 mg Intravenous Once  . vancomycin  1,000 mg Intravenous Once  . DISCONTD: enoxaparin (LOVENOX) injection  30 mg Subcutaneous Q24H    Time spent: >30 minutes    Zyree Traynham  [facility] Pager [phone]. If 8PM-8AM, please contact night-coverage at [email or web address], password [facility] 04/29/2012, 11:59 AM  LOS: 1 day

## 2012-04-29 NOTE — Clinical Social Work Psychosocial (Signed)
     Clinical Social Work Department BRIEF PSYCHOSOCIAL ASSESSMENT 04/29/2012  Patient:  Jasmine Huff, Jasmine Huff     Account Number:  [id number]     Admit date:  04/28/2012  Clinical Social Worker:  Robin Searing  Date/Time:  04/29/2012 04:00 PM  Referred by:  RN  Date Referred:  04/29/2012 Referred for  SNF Placement   Other Referral:   Interview type:  Patient Other interview type:    PSYCHOSOCIAL DATA Living Status:  FACILITY Admitted from facility:  [facility], [place] Level of care:  Skilled Nursing Facility Primary support name:  BROTHER, FRIENDS Primary support relationship to patient:  FAMILY Degree of support available:   PT. 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 SHE IS EASILY UPSET AS WE TALK ABOUT HER PLANS- SHE IS VERY HURT BY HER BROTHER'S DECISION TO NOT LET HER RETURN TO HIS HOME PRIOR TO THESE ADMISSIONS AND SNF STAY.    CURRENT CONCERNS Current Concerns  Post-Acute Placement   Other Concerns:    SOCIAL WORK ASSESSMENT / PLAN Patient wants to return to [facility], she also wants to consider completing Advance Doirective papers while she is here.   Assessment/plan status:  Other - See comment Other assessment/ plan:   Update FL2  Advance Directive   Information/referral to community resources:   Advance Directive  SNF    PATIENTS/FAMILYS RESPONSE TO PLAN OF CARE: Patient appreciative of CSW visit and support- she is hopeful to get help from the SW at the SNF with f/u related to Medicaid, longterm housing options, etc.

## 2012-04-30 ENCOUNTER — Inpatient Hospital Stay (HOSPITAL_COMMUNITY): Payer: Medicare Other

## 2012-04-30 ENCOUNTER — Encounter (INDEPENDENT_AMBULATORY_CARE_PROVIDER_SITE_OTHER): Payer: Medicare Other | Admitting: General Surgery

## 2012-04-30 DIAGNOSIS — K219 Gastro-esophageal reflux disease without esophagitis: Secondary | ICD-10-CM | POA: Diagnosis not present

## 2012-04-30 DIAGNOSIS — B370 Candidal stomatitis: Secondary | ICD-10-CM | POA: Diagnosis not present

## 2012-04-30 DIAGNOSIS — M7989 Other specified soft tissue disorders: Secondary | ICD-10-CM | POA: Diagnosis not present

## 2012-04-30 DIAGNOSIS — M129 Arthropathy, unspecified: Secondary | ICD-10-CM | POA: Diagnosis not present

## 2012-04-30 DIAGNOSIS — Z5189 Encounter for other specified aftercare: Secondary | ICD-10-CM | POA: Diagnosis not present

## 2012-04-30 DIAGNOSIS — T07XXXA Unspecified multiple injuries, initial encounter: Secondary | ICD-10-CM | POA: Diagnosis not present

## 2012-04-30 DIAGNOSIS — F329 Major depressive disorder, single episode, unspecified: Secondary | ICD-10-CM | POA: Diagnosis not present

## 2012-04-30 DIAGNOSIS — K2100 Gastro-esophageal reflux disease with esophagitis, without bleeding: Secondary | ICD-10-CM | POA: Diagnosis not present

## 2012-04-30 DIAGNOSIS — E860 Dehydration: Secondary | ICD-10-CM | POA: Diagnosis not present

## 2012-04-30 DIAGNOSIS — E119 Type 2 diabetes mellitus without complications: Secondary | ICD-10-CM

## 2012-04-30 DIAGNOSIS — E669 Obesity, unspecified: Secondary | ICD-10-CM | POA: Diagnosis not present

## 2012-04-30 DIAGNOSIS — M069 Rheumatoid arthritis, unspecified: Secondary | ICD-10-CM | POA: Diagnosis not present

## 2012-04-30 DIAGNOSIS — G8929 Other chronic pain: Secondary | ICD-10-CM | POA: Diagnosis not present

## 2012-04-30 DIAGNOSIS — F39 Unspecified mood [affective] disorder: Secondary | ICD-10-CM | POA: Diagnosis not present

## 2012-04-30 DIAGNOSIS — G890 Central pain syndrome: Secondary | ICD-10-CM | POA: Diagnosis not present

## 2012-04-30 DIAGNOSIS — D509 Iron deficiency anemia, unspecified: Secondary | ICD-10-CM | POA: Diagnosis not present

## 2012-04-30 DIAGNOSIS — G4700 Insomnia, unspecified: Secondary | ICD-10-CM | POA: Diagnosis not present

## 2012-04-30 DIAGNOSIS — L738 Other specified follicular disorders: Secondary | ICD-10-CM | POA: Diagnosis not present

## 2012-04-30 DIAGNOSIS — L0291 Cutaneous abscess, unspecified: Secondary | ICD-10-CM | POA: Diagnosis not present

## 2012-04-30 DIAGNOSIS — S3981XA Other specified injuries of abdomen, initial encounter: Secondary | ICD-10-CM | POA: Diagnosis not present

## 2012-04-30 DIAGNOSIS — R918 Other nonspecific abnormal finding of lung field: Secondary | ICD-10-CM | POA: Diagnosis not present

## 2012-04-30 DIAGNOSIS — I10 Essential (primary) hypertension: Secondary | ICD-10-CM | POA: Diagnosis not present

## 2012-04-30 DIAGNOSIS — B029 Zoster without complications: Secondary | ICD-10-CM | POA: Diagnosis not present

## 2012-04-30 DIAGNOSIS — R609 Edema, unspecified: Secondary | ICD-10-CM | POA: Diagnosis not present

## 2012-04-30 DIAGNOSIS — A419 Sepsis, unspecified organism: Secondary | ICD-10-CM | POA: Diagnosis not present

## 2012-04-30 DIAGNOSIS — E1159 Type 2 diabetes mellitus with other circulatory complications: Secondary | ICD-10-CM | POA: Diagnosis not present

## 2012-04-30 DIAGNOSIS — J189 Pneumonia, unspecified organism: Secondary | ICD-10-CM | POA: Diagnosis not present

## 2012-04-30 DIAGNOSIS — F411 Generalized anxiety disorder: Secondary | ICD-10-CM | POA: Diagnosis not present

## 2012-04-30 DIAGNOSIS — K5900 Constipation, unspecified: Secondary | ICD-10-CM | POA: Diagnosis not present

## 2012-04-30 DIAGNOSIS — J301 Allergic rhinitis due to pollen: Secondary | ICD-10-CM | POA: Diagnosis not present

## 2012-04-30 DIAGNOSIS — M1710 Unilateral primary osteoarthritis, unspecified knee: Secondary | ICD-10-CM | POA: Diagnosis not present

## 2012-04-30 DIAGNOSIS — S31109A Unspecified open wound of abdominal wall, unspecified quadrant without penetration into peritoneal cavity, initial encounter: Secondary | ICD-10-CM | POA: Diagnosis not present

## 2012-04-30 DIAGNOSIS — IMO0001 Reserved for inherently not codable concepts without codable children: Secondary | ICD-10-CM | POA: Diagnosis not present

## 2012-04-30 DIAGNOSIS — F3289 Other specified depressive episodes: Secondary | ICD-10-CM | POA: Diagnosis not present

## 2012-04-30 DIAGNOSIS — R059 Cough, unspecified: Secondary | ICD-10-CM | POA: Diagnosis not present

## 2012-04-30 DIAGNOSIS — M79609 Pain in unspecified limb: Secondary | ICD-10-CM | POA: Diagnosis not present

## 2012-04-30 LAB — BASIC METABOLIC PANEL
BUN: 12 mg/dL (ref 6–23)
CO2: 30 mEq/L (ref 19–32)
Calcium: 7.7 mg/dL — ABNORMAL LOW (ref 8.4–10.5)
Chloride: 100 mEq/L (ref 96–112)
Creatinine, Ser: 1.11 mg/dL — ABNORMAL HIGH (ref 0.50–1.10)
GFR calc Af Amer: 63 mL/min — ABNORMAL LOW (ref >90)
GFR calc non Af Amer: 54 mL/min — ABNORMAL LOW (ref >90)
Glucose, Bld: 155 mg/dL — ABNORMAL HIGH (ref 70–99)
Potassium: 3.4 mEq/L — ABNORMAL LOW (ref 3.5–5.1)
Sodium: 137 mEq/L (ref 135–145)

## 2012-04-30 LAB — CBC
HCT: 25.4 % — ABNORMAL LOW (ref 36.0–46.0)
Hemoglobin: 7.9 g/dL — ABNORMAL LOW (ref 12.0–15.0)
MCH: 26 pg (ref 26.0–34.0)
MCHC: 31.1 g/dL (ref 30.0–36.0)
MCV: 83.6 fL (ref 78.0–100.0)
Platelets: 498 10*3/uL — ABNORMAL HIGH (ref 150–400)
RBC: 3.04 MIL/uL — ABNORMAL LOW (ref 3.87–5.11)
RDW: 16.7 % — ABNORMAL HIGH (ref 11.5–15.5)
WBC: 5.9 10*3/uL (ref 4.0–10.5)

## 2012-04-30 MED ORDER — CLONAZEPAM 0.5 MG PO TABS: 0.5000 mg | ORAL_TABLET | Freq: Three times a day (TID) | ORAL | Status: DC | PRN

## 2012-04-30 MED ORDER — MORPHINE SULFATE ER 60 MG PO TBCR: 60.0000 mg | EXTENDED_RELEASE_TABLET | Freq: Two times a day (BID) | ORAL | Status: DC | PRN

## 2012-04-30 MED ORDER — AMLODIPINE BESYLATE 5 MG PO TABS: 5.0000 mg | ORAL_TABLET | Freq: Every day | ORAL | Status: DC

## 2012-04-30 MED ORDER — ARIPIPRAZOLE 2 MG PO TABS
2.0000 mg | ORAL_TABLET | Freq: Every day | ORAL | Status: DC
  Filled 2012-04-30: qty 1

## 2012-04-30 MED ORDER — ENSURE COMPLETE PO LIQD: 237.0000 mL | Freq: Two times a day (BID) | ORAL | Status: DC

## 2012-04-30 MED ORDER — OXYCODONE-ACETAMINOPHEN 5-325 MG PO TABS: 1.0000 | ORAL_TABLET | ORAL | Status: DC | PRN

## 2012-04-30 MED ORDER — FLUCONAZOLE 100 MG PO TABS: 100.0000 mg | ORAL_TABLET | Freq: Every day | ORAL | Status: AC

## 2012-04-30 NOTE — Discharge Summary (Signed)
Physician Discharge Summary  Jasmine Huff ZOX:[id number] DOB: 1955/07/15 DOA: 04/28/2012  PCP: [name], MD  Admit date: 04/28/2012 Discharge date: 04/30/2012  Time spent: >30  minutes  Recommendations for Outpatient Follow-up:  1. Follow up with [facility] service in approx. 2 weeks 2. Follow up with PCP in 2 weeks (follow BP and adjust meds as needed; also repeat BMET to evaluate renal function and electrolytes; reassess rest of medical problems and adjust medications as needed)  Discharge Diagnoses:  Active Problems:  Hypertension  Fibromyalgia  Other and unspecified hyperlipidemia  Obesity  GERD (gastroesophageal reflux disease)  Diabetes mellitus  Healthcare-associated pneumonia   Discharge Condition: stable and improved. Follow discharge instructions and medications as prescribed. Keep patient well hydrated and continue Physical therapy at the SNF.  Diet recommendation: low sodium and low carb diet  Filed Weights   04/29/12 0700  Weight: 133.8 kg (294 lb 15.6 oz)    History of present illness:  This is a 56 year old female who was recently admitted here with a perforated colon. She colostomy with subsequent wound infection and a wound VAC in place. She is at a nursing home on Augmentin. The patient states she's been getting some cramps for the past few days, she states his cramps are identical to the patient she had prior to her previous admission. Today she developed a temperature of 101.6. She reports anorexia. She denies any shortness of breath but states she's had a cough for the past few days which is productive of clear phlegm. She denies any wheezing. She denies any burning on urination. She was sent to the ER because of her temperature is without abdominal cramps. History provided by the patient  Hospital Course:  1. Fever: initially Presumed to be 2/2 HCA-PNA; subsequent CXR has r/o PNA; patient most likely experience fever due to atelectasis and mild abdominal  wound infection. Patient is now better, has not experience any further fever and is currently w/o SOB or CP. WBC's WNL as well. At this point no antibiotics will be prescribed and the patient will continue wound care and close follow up by [facility] service as an outpatient. Instructed to use spirometry to avoid atelectasis. 2. GERD: continue ppi 3. S/p colostomy with ?? Abdominal wound infection: per surgery team no further antibiotic therapy or intervention at this point. Continue wound care and follow with Dr. Maisie Fus in 2 weeks. 4. HTN: soft but stable; since patient intake is poor will stop diuretics to avoid dehydration and will start amlodipine daily. Further adjustments as needed as an outpatient. 5. Protein calorie malnutrition: due to poor intake and recent surgery. Decrease appetite. Will start patient on ensure. 6. Depression and insomnia: continue trazodone 7. Anxiety: continue PRN klonopin.  Rest of medical problems remains stable, will continue current medication regimen.  Procedures:  CXR 2 views (no PNA)  CT abdomen (see results below)  Consultations:  [facility]  Discharge Exam: Filed Vitals:   04/29/12 0700 04/29/12 1320 04/29/12 2035 04/30/12 0505  BP:  115/57 120/54 102/55  Pulse:  92 85 93  Temp:  98.2 F (36.8 C) 98.6 F (37 C) 97.7 F (36.5 C)  TempSrc:  Oral Oral Oral  Resp:  18 16 18   Height: 5\' 3"  (1.6 m)     Weight: 133.8 kg (294 lb 15.6 oz)     SpO2:  97% 95% 92%    General: afebrile, NAD,  Cardiovascular: S1 and S2; no rubs or gallops Respiratory: CTA bilaterally Abdomen:soft, ND, positive BS, ostomy bag with  stool inside Extremities: no cyanosis or edema Neuro : non focal; AAOX3  Discharge Instructions  Discharge Orders    Future Appointments: Provider: Department: Dept Phone: Center:   05/14/2012 2:00 PM Romie Levee, MD [facility] [phone] None     Future Orders Please Complete By Expires   Discharge instructions      Comments:   -Take  medications as prescribed -Keep yourself well hydrated -Follow up with [facility] in 2 weeks. -follow a low carb diet       Medication List     As of 04/30/2012  2:39 PM    STOP taking these medications         amoxicillin-clavulanate 875-125 MG per tablet   Commonly known as: AUGMENTIN      hydrochlorothiazide 25 MG tablet   Commonly known as: HYDRODIURIL      TAKE these medications         amLODipine 5 MG tablet   Commonly known as: NORVASC   Take 1 tablet (5 mg total) by mouth daily.      ARIPiprazole 2 MG tablet   Commonly known as: ABILIFY   Take 2 mg by mouth daily.      aspirin EC 81 MG tablet   Take 81 mg by mouth daily.      cetirizine 10 MG tablet   Commonly known as: ZYRTEC   Take 10 mg by mouth at bedtime.      clonazePAM 0.5 MG tablet   Commonly known as: KLONOPIN   Take 1 tablet (0.5 mg total) by mouth 3 (three) times daily as needed. For anxiety/rest      escitalopram 20 MG tablet   Commonly known as: LEXAPRO   Take 30 mg by mouth daily.      feeding supplement Liqd   Take 237 mLs by mouth 2 (two) times daily between meals.      fluconazole 100 MG tablet   Commonly known as: DIFLUCAN   Take 1 tablet (100 mg total) by mouth daily.      hydroxychloroquine 200 MG tablet   Commonly known as: PLAQUENIL   Take 200 mg by mouth 2 (two) times daily.      Iron 240 (27 FE) MG Tabs   Take 1 tablet by mouth daily.      morphine 60 MG 12 hr tablet   Commonly known as: MS CONTIN   Take 1 tablet (60 mg total) by mouth 2 (two) times daily as needed. For pain.      omeprazole 40 MG capsule   Commonly known as: PRILOSEC   Take 40 mg by mouth daily.      ondansetron 4 MG tablet   Commonly known as: ZOFRAN   Take 4 mg by mouth every 8 (eight) hours as needed. For nausea.      oxyCODONE-acetaminophen 5-325 MG per tablet   Commonly known as: PERCOCET/ROXICET   Take 1-2 tablets by mouth every 4 (four) hours as needed. For pain.      polyethylene glycol  packet   Commonly known as: MIRALAX / GLYCOLAX   Take 17 g by mouth daily.      traZODone 100 MG tablet   Commonly known as: DESYREL   Take 200 mg by mouth at bedtime.           Follow-up Information    Follow up with Vanita Panda., MD. On 05/14/2012. (Arrive 1:45 for 2pm appointment.)    Contact information:   [address] [place] [facility] [place] [address] [phone]           The results of significant diagnostics from this hospitalization (including imaging, microbiology, ancillary and laboratory) are listed below for reference.    Significant Diagnostic Studies: Ct Abdomen Pelvis Wo Contrast  04/28/2012  *RADIOLOGY REPORT*  Clinical Data: Fever, abdominal pain.  CT ABDOMEN AND PELVIS WITHOUT CONTRAST  Technique:  Multidetector CT imaging of the abdomen and pelvis was performed following the standard protocol without intravenous contrast.  Comparison: 04/28/2012 radiograph, 04/15/2012 CT  Findings: Fluid collection in the right azygoesophageal recess is similar to prior.  See separate chest CT report.  Organ abnormality/lesion detection is limited in the absence of intravenous contrast. Within this limitation, heterogeneous attenuation of the liver may reflect areas of fatty infiltration. Unremarkable spleen.  Fatty atrophy of the pancreas.  Unremarkable adrenal glands.  Unchanged appearance to the kidneys.  Mild perinephric fat stranding.  No hydronephrosis or hydroureter.  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 There are locules of extraluminal gas adjacent to the Hartmann pouch at its proximal end (adjacent to suture material).  A tract of gas and stranding extends to the level of the anterior abdominal wall, in communication with an open right lower quadrant anterior abdominal wall wound.  No bowel obstruction.  There is ingested contrast within small bowel loops.  Peritoneal nodular thickening is most pronounced within the left lateral abdomen. No  loculated/drainable fluid collection.  Normal caliber aorta.  Further vascular evaluation not possible without intravenous contrast.  No overt lymphadenopathy.  Decompressed bladder.  Absent uterus.  No adnexal mass.  Multilevel degenerative changes of the imaged spine. No acute or aggressive appearing osseous lesion.  IMPRESSION: Status post partial colectomy with left paramidline colostomy.  No bowel obstruction. No abscess.  Interval development of extra luminal locules of gas adjacent to the proximal end of the Hartmann pouch.  These gas locules and surrounding mesenteric fat stranding extend anteriorly, in the indication with the open right anterior abdominal wall wound.  Peritoneal nodular thickening is most pronounced within the left lateral abdomen. Presumably, this corresponds to reactive changes in the setting of recent perforated diverticulitis.  Recommend continued attention on follow-up examinations to document resolution.  Discussed via telephone with Dr. Silverio Lay at 09:50 p.m. on 04/28/2012.   Original Report Authenticated By: Waneta Martins, M.D.    Ct Abdomen Pelvis Wo Contrast  04/15/2012  *RADIOLOGY REPORT*  Clinical Data: 56 year old female status post Hartmann procedure for perforated colon.  CT ABDOMEN AND PELVIS WITHOUT CONTRAST  Technique:  Multidetector CT imaging of the abdomen and pelvis was performed following the standard protocol without intravenous contrast.  Comparison: 04/04/2012.  Findings: The lung bases are clear.  Stable cardiomegaly.  No pericardial effusion, but there is a new simple appearing fluid collection at the right as ago esophageal recess.  Previously herniated fat and pneumoperitoneum were present at this level.  No rim enhancement.  Negative distal thoracic esophagus.  Stable visualized osseous structures.  Large body habitus.  No pelvic free fluid.  Distal colon now appears to be blind ending at the level of the mid sigmoid. Stranding throughout the left lower  quadrant and lower small bowel mesentery.  Small bowel loops in the left abdomen from the ligament of Treitz to the left lower quadrant are very indistinct and thick-walled. These containing mostly gas, small volume of oral contrast.  Oral contrast has reached the transverse colon, but not yet exited the transverse colostomy.  A small bowel loops in the right abdomen have more normal appearance.  The ascending colon and hepatic flexure are within normal limits.  The transverse colon is mildly thick-walled.  No free air or free fluid.  No extravasated oral contrast.   Right lateral to the colostomy there is a deep ventral abdominal wall incision with mild stranding and trace subcutaneous gas. Generalized stranding along the ventral abdomen and flanks.  Stable noncontrast CT appearance of the liver, spleen, pancreas (fatty infiltrated, adrenal glands, and kidneys.  Gallbladder surgically absent.  Stomach and duodenum are decompressed.  IMPRESSION: 1.  Moderate to severe inflammation of left abdominal small bowel loops, from the region of the ligament of Treitz to the left lower quadrant. Mesenteric stranding, but no extraluminal gas or contrast. 2.  Oral contrast has reached the transverse colon without obstruction. 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 3.  Lung bases are clear.  Incidental probably trapped postoperative fluid within a previously fat containing right cardiophrenic angle hernia.   Original Report Authenticated By: Harley Hallmark, M.D.    Ct Abdomen Pelvis Wo Contrast  04/04/2012  *RADIOLOGY REPORT*  Clinical Data: Abdominal pain periumbilical.  Hysterectomy. Cholecystectomy.  CT ABDOMEN AND PELVIS WITHOUT CONTRAST  Technique:  Multidetector CT imaging of the abdomen and pelvis was performed following the standard protocol without intravenous contrast.  Comparison: Renal ultrasound 02/23/2012.  Findings: Clear lung bases.  Minimal bronchiectasis at the  lung bases which is likely post infectious or inflammatory.  Mild cardiomegaly.  Small hiatal hernia with a contrast filled mildly dilated lower thoracic esophagus.  Small volume free intraperitoneal air.  Mild hepatic steatosis. Normal spleen, stomach.  Fatty replaced pancreas. Cholecystectomy without biliary ductal dilatation.  Normal adrenal glands and kidneys.  Small retroperitoneal nodes, without adenopathy.  There is extensive colonic diverticulosis.  There is a moderate amount of stool within the proximal sigmoid with apparent mild wall thickening of the distal sigmoid.  Example images 67 and 71 respectively.  There is inflammation surround the proximal sigmoid and distal descending colon.  No well-defined fluid collection to suggest abscess.  The remainder of the colon is normal in caliber. Normal terminal ileum and appendix.  Normal caliber of small bowel loops.  Small volume ascites within the anterior pelvis and left lower quadrant.  Small pelvic sidewall nodes bilaterally. Normal urinary bladder. Hysterectomy.  No adnexal mass.   No acute osseous abnormality.  IMPRESSION:  1.  Free intraperitoneal air, secondary to inflammation centered around the distal descending and proximal sigmoid colon.  Most likely diverticulitis. Critical test results telephoned to Dr. Denton Lank at the time of interpretation at 3:05 p.m. on 04/04/2012. 2.  Small hiatal hernia. Esophageal air fluid level suggests dysmotility or gastroesophageal reflux. 3.  Suspicion of constipation, including within the region of the inflamed sigmoid.   Original Report Authenticated By: Consuello Bossier, M.D.    Dg Chest 2 View  04/30/2012  *RADIOLOGY REPORT*  Clinical Data: Follow up possible pneumonia versus atelectasis.  CHEST - 2 VIEW  Comparison: CT chest abdomen pelvis 04/28/2012 and chest radiograph 04/07/2012  Findings: Heart size is upper normal.  Thoracic aorta and hilar contours are normal.  Convex mediastinal contour adjacent to the  right heart likely reflects the azygo-esophageal recess fluid collections seen on recent chest CT.  Pulmonary vascularity is within normal limits.  Tiny opacity in the lingula likely reflects atelectasis or scarring.  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  Negative for pleural effusion or  pneumothorax.  IMPRESSION: 1. Tiny lingular opacity is favored to be due to atelectasis or scarring.  2. Small convex mediastinal contour adjacent to the right heart border likely reflects the known fluid collection in the azygo- esophageal recess as described on CT chest 04/28/2012.   Original Report Authenticated By: Britta Mccreedy, M.D.    Ct Chest Wo Contrast  04/28/2012  *RADIOLOGY REPORT*  Clinical Data: Fever  CT CHEST WITHOUT CONTRAST  Technique:  Multidetector CT imaging of the chest was performed following the standard protocol without IV contrast.  Comparison: 10/27/2011 chest CT  Findings: Central airways are patent.  Mild lingular opacity and middle lobe opacity.  No pleural effusion or pneumothorax.  Azygoesophageal recess fluid collection is similar to prior abdominal CT.  Heart size within normal limits.  Trace pericardial fluid.  No pleural effusions.  Normal caliber aorta.  Further vascular evaluation of possible without intravenous contrast.  No intrathoracic lymphadenopathy.  T11 sclerotic focus is unchanged from prior. Bilateral glenohumeral degenerative changes and deformity of the right humeral neck, incompletely imaged.  See separate abdominal CT report.  IMPRESSION: Mild lingular and middle lobe opacities, favor scarring or atelectasis.  Azygoesophageal recess fluid collection is similar to prior abdominal CT.   Original Report Authenticated By: Waneta Martins, M.D.    Dg Chest Port 1 View  04/07/2012  *RADIOLOGY REPORT*  Clinical Data: Evaluate endotracheal tube.  PORTABLE CHEST - 1 VIEW  Comparison: 04/06/2012  Findings: No endotracheal tube identified.  An NG tube descends into the abdomen,  tip not visualized.  A right IJ catheter tip projects over the cavoatrial junction.  Prominent cardiomediastinal contours with central vascular congestion.  Mild interstitial and perihilar airspace opacity. Mild retrocardiac opacity. Small effusions not excluded.  No pneumothorax.  No acute osseous finding.  IMPRESSION: Mild pulmonary edema pattern.  Mild retrocardiac opacity; atelectasis versus infiltrate.   Original Report Authenticated By: Waneta Martins, M.D.    Dg Chest Port 1 View  04/06/2012  *RADIOLOGY REPORT*  Clinical Data: Fever  PORTABLE CHEST - 1 VIEW  Comparison: April 04, 2012  Findings: There is focal infiltrate in the left base.  The lungs are otherwise clear.  Heart is upper normal in size with normal pulmonary vascularity.  No adenopathy.  Half there is arthropathy in both shoulders.  Central catheter tip is near the junction of the superior vena cava and right atrium.  Nasogastric tube extends into the stomach.  No pneumothorax.  IMPRESSION: Left base infiltrate.  No pneumothorax.  No change in cardiac silhouette.  Endotracheal tube no longer present.   Original Report Authenticated By: Arvin Collard. Gaspar Garbe, M.D.    Dg Chest Port 1 View  04/05/2012  *RADIOLOGY REPORT*  Clinical Data: Central line placement  PORTABLE CHEST - 1 VIEW  Comparison: Earlier the same day  Findings: 2353 hours.  Endotracheal tube tip is 2.9 cm above the base of the carina. The NG tube passes into the stomach although the distal tip position is not included on the film.  Right IJ central line tip projects at the distal SVC level.  No evidence for right-sided pneumothorax.  The lung volumes remain low. The cardiopericardial silhouette is enlarged.  Vascular congestion persists and there appears to be slight increase in interstitial pulmonary edema.  IMPRESSION: Right IJ central line tip projects at the distal SVC level.  No evidence for pneumothorax.  Lung volumes with vascular congestion and probable  progressing interstitial pulmonary edema.   Original Report Authenticated By:  ERIC A. MANSELL, M.D.    Dg Chest Port 1 View  04/04/2012  *RADIOLOGY REPORT*  Clinical Data: Endotracheal tube placement  PORTABLE CHEST - 1 VIEW  Comparison: 02/23/2012  Findings: Endotracheal tube tip is 2.4 cm above the base of the carina. The NG tube passes into the stomach although the distal tip position is not included on the film.  Lung volumes are low. Vascular congestion noted. The cardiopericardial silhouette is enlarged.  IMPRESSION: Low volume film with cardiomegaly and pulmonary vascular congestion.  Endotracheal tube tip is 2.4 cm above the base of the carina.   Original Report Authenticated By: ERIC A. MANSELL, M.D.    Dg Abd Acute W/chest  04/28/2012  *RADIOLOGY REPORT*  Clinical Data: Nausea, fever, abdominal pain  ACUTE ABDOMEN SERIES (ABDOMEN 2 VIEW & CHEST 1 VIEW)  Comparison: Chest radiograph 04/07/2012  Findings: Enlargement of cardiac silhouette with pulmonary vascular congestion. Lungs clear. Bones demineralized with bilateral glenohumeral degenerative changes noted. Surgical clips right upper quadrant question cholecystectomy. Nonobstructive bowel gas pattern. No definite bowel dilatation or bowel wall thickening. Large radiopacity is seen in the right pelvis, 14.6 x 9.9 cm, question ostomy bag. No acute osseous findings or definite urinary tract calcification. No free intraperitoneal air.  IMPRESSION: Large radiopacity in the right pelvis, question ostomy bag. Enlargement of cardiac silhouette with pulmonary vascular congestion. Otherwise negative exam.   Original Report Authenticated By: Lollie Marrow, M.D.     Microbiology: Recent Results (from the past 240 hour(s))  CULTURE, BLOOD (ROUTINE X 2)     Status: Normal (Preliminary result)   Collection Time   04/28/12  6:20 PM      Component Value Range Status Comment   Specimen Description BLOOD LEFT HAND   Final    Special Requests BOTTLES DRAWN  AEROBIC AND ANAEROBIC 5CC   Final    Culture  Setup Time 04/28/2012 23:20   Final    Culture     Final    Value:        BLOOD CULTURE RECEIVED NO GROWTH TO DATE CULTURE WILL BE HELD FOR 5 DAYS BEFORE ISSUING A FINAL NEGATIVE REPORT   Report Status PENDING   Incomplete   CULTURE, BLOOD (ROUTINE X 2)     Status: Normal (Preliminary result)   Collection Time   04/28/12  6:20 PM      Component Value Range Status Comment   Specimen Description BLOOD LEFT ARM   Final    Special Requests BOTTLES DRAWN AEROBIC AND ANAEROBIC 2CC   Final    Culture  Setup Time 04/28/2012 23:20   Final    Culture     Final    Value:        BLOOD CULTURE RECEIVED NO GROWTH TO DATE CULTURE WILL BE HELD FOR 5 DAYS BEFORE ISSUING A FINAL NEGATIVE REPORT   Report Status PENDING   Incomplete   URINE CULTURE     Status: Normal (Preliminary result)   Collection Time   04/28/12  7:58 PM      Component Value Range Status Comment   Specimen Description URINE, CLEAN CATCH   Final    Special Requests NONE   Final    Culture  Setup Time 04/29/2012 02:39   Final    Colony Count PENDING   Incomplete    Culture Culture reincubated for better growth   Final    Report Status PENDING   Incomplete      Labs: Basic Metabolic Panel:  Lab 04/30/12  8657 04/29/12 0641 04/28/12 1820  NA 137 138 135  K 3.4* 3.9 4.2  CL 100 100 96  CO2 30 29 29   GLUCOSE 155* 165* 132*  BUN 12 10 11   CREATININE 1.11* 0.87 0.90  CALCIUM 7.7* 7.7* 8.3*  MG -- -- --  PHOS -- -- --   Liver Function Tests:  Lab 04/28/12 1820  AST 26  ALT 25  ALKPHOS 470*  BILITOT 0.1*  PROT 6.6  ALBUMIN 1.9*   CBC:  Lab 04/30/12 0355 04/29/12 0641 04/28/12 1820  WBC 5.9 6.5 7.8  NEUTROABS -- -- 5.1  HGB 7.9* 8.1* 9.2*  HCT 25.4* 25.7* 30.5*  MCV 83.6 82.6 83.1  PLT 498* 499* 521*   BNP: BNP (last 3 results)  Basename 04/07/12 0350 02/23/12 1323  PROBNP 260.8* 1049.0*    Signed:  Jakyron Fabro  [facility] 04/30/2012, 2:39  PM

## 2012-04-30 NOTE — Progress Notes (Signed)
Patient for d/c today to SNF bed at [facility] as prior to this admission. Patient agreeable to this plan- plan transfer via EMS. Reece Levy, MSW, LCSWA [phone]/for Jacklynn Lewis, [place]

## 2012-04-30 NOTE — Progress Notes (Signed)
PT Cancellation Note  Patient Details Name: Jasmine Huff MRN: [id number] DOB: 05/06/1956   Cancelled Treatment:    Reason Eval/Treat Not Completed: Other (comment) (pt fatigued from just having OT. states she is doing fine ) Pt is getting up on in her room on her own. Will see for acute PT eval if pt. Is still here on Monday   Donnetta Hail 04/30/2012, 11:03 AM

## 2012-04-30 NOTE — Evaluation (Signed)
Occupational Therapy Evaluation Patient Details Name: Jasmine Huff MRN: [id number] DOB: 1955/09/13 Today's Date: 04/30/2012 Time: 0865-7846 OT Time Calculation (min): 20 min  OT Assessment / Plan / Recommendation Clinical Impression  Pt is a 56 yo female who is currently residing in a nursing facility for rehab. Plan is to return at d/c. Pt admitted for HCAP. Upon last admission, pt admitted for left hemicolectomy with splenic flexure mobilization and end colostomy for severe constipation. Skilled OT indicated to maximize independence with BADLs  in prep for d/c to next venue.    OT Assessment  Patient needs continued OT Services    Follow Up Recommendations  Skilled nursing facility    Barriers to Discharge      Equipment Recommendations  3 in 1 bedside comode;Rolling walker with 5" wheels    Recommendations for Other Services    Frequency  Min 1X/week    Precautions / Restrictions Precautions Precautions: Fall Precaution Comments: colostomy with drain   Pertinent Vitals/Pain Reported 6-7/10 pain in abdomen. Repositioned and RN made aware.    ADL  Grooming: Performed;Brushing hair;Wash/dry face;Set up Where Assessed - Grooming: Unsupported sitting Upper Body Bathing: Simulated;Set up Where Assessed - Upper Body Bathing: Unsupported sitting Lower Body Bathing: Simulated;Minimal assistance Where Assessed - Lower Body Bathing: Unsupported sit to stand Upper Body Dressing: Simulated;Set up Where Assessed - Upper Body Dressing: Unsupported sitting Lower Body Dressing: Simulated;Moderate assistance Where Assessed - Lower Body Dressing: Unsupported sit to stand Toilet Transfer: Performed;Supervision/safety Toilet Transfer Method: Stand pivot Acupuncturist: Extra wide bedside commode Toileting - Clothing Manipulation and Hygiene: Performed;Supervision/safety Where Assessed - Toileting Clothing Manipulation and Hygiene: Sit to stand from 3-in-1 or  toilet Transfers/Ambulation Related to ADLs: Pt transferred to [facility] to chair. Refused a full spongebath.  ADL Comments: Pt requires max encouragement for participation and states, "they are pushing her too hard at the rehab...." Pt states she wears hospital gowns and socks even at rehab.     OT Diagnosis: Generalized weakness  OT Problem List: Decreased activity tolerance;Decreased knowledge of use of DME or AE;Pain;Obesity OT Treatment Interventions: Self-care/ADL training;Therapeutic activities;DME and/or AE instruction;Patient/family education   OT Goals Acute Rehab OT Goals OT Goal Formulation: With patient Time For Goal Achievement: 05/14/12 Potential to Achieve Goals: Good ADL Goals Pt Will Perform Grooming: with modified independence;Standing at sink ADL Goal: Grooming - Progress: Goal set today Pt Will Transfer to Toilet: with modified independence;Ambulation;Extra wide 3-in-1 ADL Goal: Toilet Transfer - Progress: Goal set today Pt Will Perform Toileting - Clothing Manipulation: with modified independence;Sitting on 3-in-1 or toilet;Standing ADL Goal: Toileting - Clothing Manipulation - Progress: Goal set today Pt Will Perform Toileting - Hygiene: with modified independence;Sit to stand from 3-in-1/toilet ADL Goal: Toileting - Hygiene - Progress: Goal set today  Visit Information  Last OT Received On: 04/30/12 Assistance Needed: +1    Subjective Data  Subjective: They push me too hard in rehab. Patient Stated Goal: To leave tomorrow.   Prior Functioning     Home Living Lives With:  (arrived from snf.) Available Help at Discharge: Skilled Nursing Facility Additional Comments: Planning to return to snf at d/c. Prior Function Level of Independence: Needs assistance Needs Assistance: Bathing;Dressing;Meal Prep Bath: Supervision/set-up Dressing: Supervision/set-up Meal Prep: Total Light Housekeeping: Total Driving: No Vocation: On  disability Communication Communication: No difficulties Dominant Hand: Right         Vision/Perception     Cognition  Overall Cognitive Status: Appears within functional limits for tasks assessed/performed Arousal/Alertness: Awake/alert  Orientation Level: Appears intact for tasks assessed Behavior During Session: Other (comment) (emotional, tearful)    Extremity/Trunk Assessment Right Upper Extremity Assessment RUE ROM/Strength/Tone: [facility] for tasks assessed Left Upper Extremity Assessment LUE ROM/Strength/Tone: WFL for tasks assessed     Mobility Transfers Sit to Stand: 5: Supervision;With upper extremity assist;From chair/3-in-1;With armrests Stand to Sit: 5: Supervision;With upper extremity assist;With armrests;To chair/3-in-1 Details for Transfer Assistance: Pt demo'd good technique.     Shoulder Instructions     Exercise     Balance Static Sitting Balance Static Sitting - Balance Support: No upper extremity supported;Feet supported Static Sitting - Level of Assistance: 6: Modified independent (Device/Increase time)   End of Session OT - End of Session Activity Tolerance: Patient tolerated treatment well Patient left: in chair;with call bell/phone within reach Nurse Communication: Patient requests pain meds  GO     Jourdan Maldonado A OTR/L [phone] 04/30/2012, 10:55 AM

## 2012-05-01 LAB — URINE CULTURE

## 2012-05-04 LAB — CULTURE, BLOOD (ROUTINE X 2)
Culture: NO GROWTH
Culture: NO GROWTH

## 2012-05-07 DIAGNOSIS — G890 Central pain syndrome: Secondary | ICD-10-CM | POA: Diagnosis not present

## 2012-05-07 DIAGNOSIS — J301 Allergic rhinitis due to pollen: Secondary | ICD-10-CM | POA: Diagnosis not present

## 2012-05-07 DIAGNOSIS — I10 Essential (primary) hypertension: Secondary | ICD-10-CM | POA: Diagnosis not present

## 2012-05-07 DIAGNOSIS — M069 Rheumatoid arthritis, unspecified: Secondary | ICD-10-CM | POA: Diagnosis not present

## 2012-05-07 DIAGNOSIS — K5900 Constipation, unspecified: Secondary | ICD-10-CM | POA: Diagnosis not present

## 2012-05-11 DIAGNOSIS — G890 Central pain syndrome: Secondary | ICD-10-CM | POA: Diagnosis not present

## 2012-05-11 DIAGNOSIS — F3289 Other specified depressive episodes: Secondary | ICD-10-CM | POA: Diagnosis not present

## 2012-05-11 DIAGNOSIS — E669 Obesity, unspecified: Secondary | ICD-10-CM | POA: Diagnosis not present

## 2012-05-11 DIAGNOSIS — J189 Pneumonia, unspecified organism: Secondary | ICD-10-CM | POA: Diagnosis not present

## 2012-05-11 DIAGNOSIS — A419 Sepsis, unspecified organism: Secondary | ICD-10-CM | POA: Diagnosis not present

## 2012-05-11 DIAGNOSIS — F329 Major depressive disorder, single episode, unspecified: Secondary | ICD-10-CM | POA: Diagnosis not present

## 2012-05-14 ENCOUNTER — Encounter (INDEPENDENT_AMBULATORY_CARE_PROVIDER_SITE_OTHER): Payer: Self-pay | Admitting: General Surgery

## 2012-05-14 ENCOUNTER — Ambulatory Visit (INDEPENDENT_AMBULATORY_CARE_PROVIDER_SITE_OTHER): Payer: Medicare Other | Admitting: General Surgery

## 2012-05-14 VITALS — BP 152/60 | HR 84 | Temp 97.8°F | Resp 16 | Ht 63.0 in | Wt 290.0 lb

## 2012-05-14 DIAGNOSIS — K631 Perforation of intestine (nontraumatic): Secondary | ICD-10-CM

## 2012-05-14 NOTE — Progress Notes (Signed)
Jasmine Huff is a 56 y.o. female who is here for a follow up visit regarding her sigmoidectomy and end colostomy.  This was performed on 9/29 due to sigmoid colon perforation.  Her wound was left open and a wound vac was placed.  She was recently hospitalized for pneumonia.  She is residing at an ECF and doing daily PT.  She still has a cough but denies fevers or SOB.  She c/o persistent nausea.  Her last CT was rather unremarkable.    Objective: Filed Vitals:   05/14/12 1330  BP: 152/60  Pulse: 84  Temp: 97.8 F (36.6 C)  Resp: 16    General appearance: alert and cooperative GI: soft, non-tender; bowel sounds normal; no masses,  no organomegaly wound- deep but granulating well   Assessment and Plan: Cont wound vac and PT.  Return to clinic in 4 weeks.     Vanita Panda, MD [facility] Surgery, [place] [phone]

## 2012-05-14 NOTE — Patient Instructions (Signed)
Continue PT and wound care.  Return to office in 4 weeks.

## 2012-05-19 DIAGNOSIS — G4700 Insomnia, unspecified: Secondary | ICD-10-CM | POA: Diagnosis not present

## 2012-05-19 DIAGNOSIS — F39 Unspecified mood [affective] disorder: Secondary | ICD-10-CM | POA: Diagnosis not present

## 2012-05-19 DIAGNOSIS — F411 Generalized anxiety disorder: Secondary | ICD-10-CM | POA: Diagnosis not present

## 2012-05-19 DIAGNOSIS — F329 Major depressive disorder, single episode, unspecified: Secondary | ICD-10-CM | POA: Diagnosis not present

## 2012-05-21 DIAGNOSIS — R609 Edema, unspecified: Secondary | ICD-10-CM | POA: Diagnosis not present

## 2012-05-22 DIAGNOSIS — M79609 Pain in unspecified limb: Secondary | ICD-10-CM | POA: Diagnosis not present

## 2012-05-22 DIAGNOSIS — M7989 Other specified soft tissue disorders: Secondary | ICD-10-CM | POA: Diagnosis not present

## 2012-05-26 DIAGNOSIS — M069 Rheumatoid arthritis, unspecified: Secondary | ICD-10-CM | POA: Diagnosis not present

## 2012-05-26 DIAGNOSIS — R05 Cough: Secondary | ICD-10-CM | POA: Diagnosis not present

## 2012-05-26 DIAGNOSIS — R609 Edema, unspecified: Secondary | ICD-10-CM | POA: Diagnosis not present

## 2012-06-07 ENCOUNTER — Telehealth (INDEPENDENT_AMBULATORY_CARE_PROVIDER_SITE_OTHER): Payer: Self-pay

## 2012-06-07 NOTE — Telephone Encounter (Signed)
I rescheduled the pt's appointment and left her a message with the date and time.

## 2012-06-07 NOTE — Telephone Encounter (Signed)
Message copied by Ivory Broad on Mon Jun 07, 2012 12:40 PM ------      Message from: Zacarias Pontes      Created: Wed Jun 02, 2012  3:09 PM        [place] Dec, I had to cancel this pts Dec 3rd apt,please reschedule her po  for the next week of Dec if you can, her mother just passed away..Thanks

## 2012-06-08 ENCOUNTER — Encounter (INDEPENDENT_AMBULATORY_CARE_PROVIDER_SITE_OTHER): Payer: Medicare Other | Admitting: General Surgery

## 2012-06-09 DIAGNOSIS — I10 Essential (primary) hypertension: Secondary | ICD-10-CM | POA: Diagnosis not present

## 2012-06-09 DIAGNOSIS — R609 Edema, unspecified: Secondary | ICD-10-CM | POA: Diagnosis not present

## 2012-06-09 DIAGNOSIS — R05 Cough: Secondary | ICD-10-CM | POA: Diagnosis not present

## 2012-06-09 DIAGNOSIS — B029 Zoster without complications: Secondary | ICD-10-CM | POA: Diagnosis not present

## 2012-06-15 DIAGNOSIS — F39 Unspecified mood [affective] disorder: Secondary | ICD-10-CM | POA: Diagnosis not present

## 2012-06-15 DIAGNOSIS — F329 Major depressive disorder, single episode, unspecified: Secondary | ICD-10-CM | POA: Diagnosis not present

## 2012-06-15 DIAGNOSIS — F411 Generalized anxiety disorder: Secondary | ICD-10-CM | POA: Diagnosis not present

## 2012-06-15 DIAGNOSIS — G4700 Insomnia, unspecified: Secondary | ICD-10-CM | POA: Diagnosis not present

## 2012-06-16 ENCOUNTER — Ambulatory Visit (INDEPENDENT_AMBULATORY_CARE_PROVIDER_SITE_OTHER): Payer: Medicare Other | Admitting: General Surgery

## 2012-06-16 ENCOUNTER — Encounter (INDEPENDENT_AMBULATORY_CARE_PROVIDER_SITE_OTHER): Payer: Self-pay | Admitting: General Surgery

## 2012-06-16 VITALS — BP 136/82 | HR 90 | Temp 98.0°F | Resp 18 | Ht 63.0 in | Wt 290.0 lb

## 2012-06-16 DIAGNOSIS — Z9889 Other specified postprocedural states: Secondary | ICD-10-CM

## 2012-06-16 NOTE — Patient Instructions (Signed)
Pack wound daily  We will schedule you an apt to see Dr Paulino Rily   Return to office in 4 weeks

## 2012-06-16 NOTE — Progress Notes (Signed)
Jasmine Huff is a 56 y.o. female who is here for a follow up visit regarding her sigmoidectomy and end colostomy. This was performed on 9/29 due to sigmoid colon perforation. Her wound was left open and a wound vac was placed. She is residing at an ECF and doing daily PT.  She is having good ostomy output and getting an appetite back.    Objective:  Filed Vitals:   06/16/12 1621  BP: 136/82  Pulse: 90  Temp: 98 F (36.7 C)  Resp: 18    General appearance: alert and cooperative  GI: soft, non-tender; bowel sounds normal; no masses, no organomegaly  wound- deep but granulating well, silver nitrate applied to edges Ostomy pink and viable  Assessment and Plan:  D/C wound vac and start packing wound and continue PT. Return to clinic in 4 weeks.

## 2012-06-18 DIAGNOSIS — R609 Edema, unspecified: Secondary | ICD-10-CM | POA: Diagnosis not present

## 2012-06-18 DIAGNOSIS — F411 Generalized anxiety disorder: Secondary | ICD-10-CM | POA: Diagnosis not present

## 2012-06-25 DIAGNOSIS — G890 Central pain syndrome: Secondary | ICD-10-CM | POA: Diagnosis not present

## 2012-06-25 DIAGNOSIS — D509 Iron deficiency anemia, unspecified: Secondary | ICD-10-CM | POA: Diagnosis not present

## 2012-06-25 DIAGNOSIS — E1159 Type 2 diabetes mellitus with other circulatory complications: Secondary | ICD-10-CM | POA: Diagnosis not present

## 2012-06-25 DIAGNOSIS — R609 Edema, unspecified: Secondary | ICD-10-CM | POA: Diagnosis not present

## 2012-06-25 DIAGNOSIS — L738 Other specified follicular disorders: Secondary | ICD-10-CM | POA: Diagnosis not present

## 2012-06-25 DIAGNOSIS — M069 Rheumatoid arthritis, unspecified: Secondary | ICD-10-CM | POA: Diagnosis not present

## 2012-06-28 ENCOUNTER — Telehealth (INDEPENDENT_AMBULATORY_CARE_PROVIDER_SITE_OTHER): Payer: Self-pay | Admitting: General Surgery

## 2012-06-28 NOTE — Telephone Encounter (Signed)
Pt called to ask about feeling of need to defecate while having a colostomy.  Reassured pt this is a normal feeling; will usually on pass mucous.  Pt stated she has done this and was quite worried.

## 2012-07-02 DIAGNOSIS — D509 Iron deficiency anemia, unspecified: Secondary | ICD-10-CM | POA: Diagnosis not present

## 2012-07-02 DIAGNOSIS — R609 Edema, unspecified: Secondary | ICD-10-CM | POA: Diagnosis not present

## 2012-07-02 DIAGNOSIS — S3981XA Other specified injuries of abdomen, initial encounter: Secondary | ICD-10-CM | POA: Diagnosis not present

## 2012-07-02 DIAGNOSIS — F411 Generalized anxiety disorder: Secondary | ICD-10-CM | POA: Diagnosis not present

## 2012-07-02 DIAGNOSIS — E1159 Type 2 diabetes mellitus with other circulatory complications: Secondary | ICD-10-CM | POA: Diagnosis not present

## 2012-07-07 DIAGNOSIS — F329 Major depressive disorder, single episode, unspecified: Secondary | ICD-10-CM | POA: Diagnosis not present

## 2012-07-07 DIAGNOSIS — E860 Dehydration: Secondary | ICD-10-CM | POA: Diagnosis not present

## 2012-07-07 DIAGNOSIS — M129 Arthropathy, unspecified: Secondary | ICD-10-CM | POA: Diagnosis not present

## 2012-07-07 DIAGNOSIS — A419 Sepsis, unspecified organism: Secondary | ICD-10-CM | POA: Diagnosis not present

## 2012-07-07 DIAGNOSIS — I10 Essential (primary) hypertension: Secondary | ICD-10-CM | POA: Diagnosis not present

## 2012-07-07 DIAGNOSIS — B370 Candidal stomatitis: Secondary | ICD-10-CM | POA: Diagnosis not present

## 2012-07-07 DIAGNOSIS — G4700 Insomnia, unspecified: Secondary | ICD-10-CM | POA: Diagnosis not present

## 2012-07-07 DIAGNOSIS — L0291 Cutaneous abscess, unspecified: Secondary | ICD-10-CM | POA: Diagnosis not present

## 2012-07-07 DIAGNOSIS — K219 Gastro-esophageal reflux disease without esophagitis: Secondary | ICD-10-CM | POA: Diagnosis not present

## 2012-07-07 DIAGNOSIS — E119 Type 2 diabetes mellitus without complications: Secondary | ICD-10-CM | POA: Diagnosis not present

## 2012-07-07 DIAGNOSIS — F39 Unspecified mood [affective] disorder: Secondary | ICD-10-CM | POA: Diagnosis not present

## 2012-07-07 DIAGNOSIS — F411 Generalized anxiety disorder: Secondary | ICD-10-CM | POA: Diagnosis not present

## 2012-07-07 DIAGNOSIS — S31109A Unspecified open wound of abdominal wall, unspecified quadrant without penetration into peritoneal cavity, initial encounter: Secondary | ICD-10-CM | POA: Diagnosis not present

## 2012-07-16 ENCOUNTER — Encounter (INDEPENDENT_AMBULATORY_CARE_PROVIDER_SITE_OTHER): Payer: Medicare Other | Admitting: General Surgery

## 2012-07-20 ENCOUNTER — Encounter (INDEPENDENT_AMBULATORY_CARE_PROVIDER_SITE_OTHER): Payer: Self-pay | Admitting: General Surgery

## 2012-07-20 ENCOUNTER — Ambulatory Visit (INDEPENDENT_AMBULATORY_CARE_PROVIDER_SITE_OTHER): Payer: Medicare Other | Admitting: General Surgery

## 2012-07-20 VITALS — BP 118/70 | HR 66 | Temp 98.1°F | Resp 18 | Ht 63.0 in | Wt 305.0 lb

## 2012-07-20 DIAGNOSIS — S31109A Unspecified open wound of abdominal wall, unspecified quadrant without penetration into peritoneal cavity, initial encounter: Secondary | ICD-10-CM

## 2012-07-20 NOTE — Progress Notes (Signed)
Jasmine Huff is a 57 y.o. female who is here for a follow up visit regarding her abdominal wound.  She is s/p sigmoidectomy in Aug.  She has a slowly healing abd wound.  She is changing the packing once a week.    Objective: Filed Vitals:   07/20/12 1204  BP: 118/70  Pulse: 66  Temp: 98.1 F (36.7 C)  Resp: 18    General appearance: alert, cooperative and no distress GI: normal findings: soft, non-tender wound- smaller in diameter, ~5-6cm deep , good granulation tissue   Assessment and Plan: Will wait for wound to heal before ostomy closure Return to office in 4 weeks    .Vanita Panda, MD [facility] Surgery, [place] [phone]

## 2012-07-20 NOTE — Patient Instructions (Signed)
Continue dressing changes- switch to daily dressing changes  Return to the office in 4 weeks

## 2012-07-29 DIAGNOSIS — E669 Obesity, unspecified: Secondary | ICD-10-CM | POA: Diagnosis not present

## 2012-07-29 DIAGNOSIS — M6281 Muscle weakness (generalized): Secondary | ICD-10-CM | POA: Diagnosis not present

## 2012-07-29 DIAGNOSIS — Z8719 Personal history of other diseases of the digestive system: Secondary | ICD-10-CM | POA: Diagnosis not present

## 2012-07-29 DIAGNOSIS — F329 Major depressive disorder, single episode, unspecified: Secondary | ICD-10-CM | POA: Diagnosis not present

## 2012-07-29 DIAGNOSIS — G894 Chronic pain syndrome: Secondary | ICD-10-CM | POA: Diagnosis not present

## 2012-07-29 DIAGNOSIS — L0291 Cutaneous abscess, unspecified: Secondary | ICD-10-CM | POA: Diagnosis not present

## 2012-07-29 DIAGNOSIS — Z6841 Body Mass Index (BMI) 40.0 and over, adult: Secondary | ICD-10-CM | POA: Diagnosis not present

## 2012-07-29 DIAGNOSIS — F411 Generalized anxiety disorder: Secondary | ICD-10-CM | POA: Diagnosis not present

## 2012-07-29 DIAGNOSIS — R262 Difficulty in walking, not elsewhere classified: Secondary | ICD-10-CM | POA: Diagnosis not present

## 2012-07-29 DIAGNOSIS — Z433 Encounter for attention to colostomy: Secondary | ICD-10-CM | POA: Diagnosis not present

## 2012-07-29 DIAGNOSIS — T8189XA Other complications of procedures, not elsewhere classified, initial encounter: Secondary | ICD-10-CM | POA: Diagnosis not present

## 2012-07-30 DIAGNOSIS — L0390 Cellulitis, unspecified: Secondary | ICD-10-CM | POA: Diagnosis not present

## 2012-07-30 DIAGNOSIS — T8189XA Other complications of procedures, not elsewhere classified, initial encounter: Secondary | ICD-10-CM | POA: Diagnosis not present

## 2012-07-30 DIAGNOSIS — F329 Major depressive disorder, single episode, unspecified: Secondary | ICD-10-CM | POA: Diagnosis not present

## 2012-07-30 DIAGNOSIS — L0291 Cutaneous abscess, unspecified: Secondary | ICD-10-CM | POA: Diagnosis not present

## 2012-07-30 DIAGNOSIS — Z8719 Personal history of other diseases of the digestive system: Secondary | ICD-10-CM | POA: Diagnosis not present

## 2012-07-30 DIAGNOSIS — M6281 Muscle weakness (generalized): Secondary | ICD-10-CM | POA: Diagnosis not present

## 2012-07-30 DIAGNOSIS — G894 Chronic pain syndrome: Secondary | ICD-10-CM | POA: Diagnosis not present

## 2012-07-31 DIAGNOSIS — Z8719 Personal history of other diseases of the digestive system: Secondary | ICD-10-CM | POA: Diagnosis not present

## 2012-07-31 DIAGNOSIS — T8189XA Other complications of procedures, not elsewhere classified, initial encounter: Secondary | ICD-10-CM | POA: Diagnosis not present

## 2012-07-31 DIAGNOSIS — F329 Major depressive disorder, single episode, unspecified: Secondary | ICD-10-CM | POA: Diagnosis not present

## 2012-07-31 DIAGNOSIS — G894 Chronic pain syndrome: Secondary | ICD-10-CM | POA: Diagnosis not present

## 2012-07-31 DIAGNOSIS — M6281 Muscle weakness (generalized): Secondary | ICD-10-CM | POA: Diagnosis not present

## 2012-07-31 DIAGNOSIS — L0291 Cutaneous abscess, unspecified: Secondary | ICD-10-CM | POA: Diagnosis not present

## 2012-08-01 DIAGNOSIS — F329 Major depressive disorder, single episode, unspecified: Secondary | ICD-10-CM | POA: Diagnosis not present

## 2012-08-01 DIAGNOSIS — M6281 Muscle weakness (generalized): Secondary | ICD-10-CM | POA: Diagnosis not present

## 2012-08-01 DIAGNOSIS — G894 Chronic pain syndrome: Secondary | ICD-10-CM | POA: Diagnosis not present

## 2012-08-01 DIAGNOSIS — T8189XA Other complications of procedures, not elsewhere classified, initial encounter: Secondary | ICD-10-CM | POA: Diagnosis not present

## 2012-08-01 DIAGNOSIS — Z8719 Personal history of other diseases of the digestive system: Secondary | ICD-10-CM | POA: Diagnosis not present

## 2012-08-01 DIAGNOSIS — L0390 Cellulitis, unspecified: Secondary | ICD-10-CM | POA: Diagnosis not present

## 2012-08-02 ENCOUNTER — Telehealth (INDEPENDENT_AMBULATORY_CARE_PROVIDER_SITE_OTHER): Payer: Self-pay | Admitting: General Surgery

## 2012-08-02 DIAGNOSIS — F329 Major depressive disorder, single episode, unspecified: Secondary | ICD-10-CM | POA: Diagnosis not present

## 2012-08-02 DIAGNOSIS — T8189XA Other complications of procedures, not elsewhere classified, initial encounter: Secondary | ICD-10-CM | POA: Diagnosis not present

## 2012-08-02 DIAGNOSIS — G894 Chronic pain syndrome: Secondary | ICD-10-CM | POA: Diagnosis not present

## 2012-08-02 DIAGNOSIS — M6281 Muscle weakness (generalized): Secondary | ICD-10-CM | POA: Diagnosis not present

## 2012-08-02 DIAGNOSIS — L0390 Cellulitis, unspecified: Secondary | ICD-10-CM | POA: Diagnosis not present

## 2012-08-02 DIAGNOSIS — Z8719 Personal history of other diseases of the digestive system: Secondary | ICD-10-CM | POA: Diagnosis not present

## 2012-08-02 NOTE — Telephone Encounter (Signed)
She should switch to packing BID.  If she is having trouble with drainage, then she can pack the guaze in dry.  We already tried a wound vac, but the patient did not tolerate it.

## 2012-08-02 NOTE — Telephone Encounter (Signed)
I called and left a message for Jasmine Huff to call me back so I can give her the message.

## 2012-08-02 NOTE — Telephone Encounter (Signed)
Jasmine Huff called back, we gave her the message but she says the pt wants to try the wound vac.  I will let Dr Maisie Fus know.

## 2012-08-02 NOTE — Telephone Encounter (Signed)
Vernona Rieger called in stating that the patient has a wound site that is still about 5 cm deep and 2 cm wide with moderate drainage that is starting to affect the healthy skin around it. She wanted to know if Dr. Maisie Fus would approve a wound vac for the patient. I advised that Dr. Maisie Fus is in surgery today, but her request would be forwarded to her and to [facility].  This patient had exploratory lap and colostomy on 04/04/12. Vernona Rieger requested call back on [phone].

## 2012-08-02 NOTE — Telephone Encounter (Signed)
Jasmine Huff with [facility] called back to let us know the reason the patient did not want a wound vac before was the weight but the [employer] vac is lighter and she thinks she can use it. I told her I would pass along the message.

## 2012-08-03 DIAGNOSIS — G894 Chronic pain syndrome: Secondary | ICD-10-CM | POA: Diagnosis not present

## 2012-08-03 DIAGNOSIS — M6281 Muscle weakness (generalized): Secondary | ICD-10-CM | POA: Diagnosis not present

## 2012-08-03 DIAGNOSIS — T8189XA Other complications of procedures, not elsewhere classified, initial encounter: Secondary | ICD-10-CM | POA: Diagnosis not present

## 2012-08-03 DIAGNOSIS — L0291 Cutaneous abscess, unspecified: Secondary | ICD-10-CM | POA: Diagnosis not present

## 2012-08-03 DIAGNOSIS — Z8719 Personal history of other diseases of the digestive system: Secondary | ICD-10-CM | POA: Diagnosis not present

## 2012-08-03 DIAGNOSIS — F329 Major depressive disorder, single episode, unspecified: Secondary | ICD-10-CM | POA: Diagnosis not present

## 2012-08-03 NOTE — Telephone Encounter (Signed)
Vernona Rieger called back and she was notified the vac is ok with Dr Maisie Fus.

## 2012-08-03 NOTE — Telephone Encounter (Signed)
I called and left a message for Vernona Rieger to call me.  I want to let her know the wound vac is fine with Dr Maisie Fus.

## 2012-08-03 NOTE — Telephone Encounter (Signed)
The wound vac is fine with me, if the patient would like to try it

## 2012-08-04 DIAGNOSIS — G894 Chronic pain syndrome: Secondary | ICD-10-CM | POA: Diagnosis not present

## 2012-08-04 DIAGNOSIS — Z8719 Personal history of other diseases of the digestive system: Secondary | ICD-10-CM | POA: Diagnosis not present

## 2012-08-04 DIAGNOSIS — M6281 Muscle weakness (generalized): Secondary | ICD-10-CM | POA: Diagnosis not present

## 2012-08-04 DIAGNOSIS — T8189XA Other complications of procedures, not elsewhere classified, initial encounter: Secondary | ICD-10-CM | POA: Diagnosis not present

## 2012-08-04 DIAGNOSIS — L0291 Cutaneous abscess, unspecified: Secondary | ICD-10-CM | POA: Diagnosis not present

## 2012-08-04 DIAGNOSIS — F329 Major depressive disorder, single episode, unspecified: Secondary | ICD-10-CM | POA: Diagnosis not present

## 2012-08-05 DIAGNOSIS — M6281 Muscle weakness (generalized): Secondary | ICD-10-CM | POA: Diagnosis not present

## 2012-08-05 DIAGNOSIS — F329 Major depressive disorder, single episode, unspecified: Secondary | ICD-10-CM | POA: Diagnosis not present

## 2012-08-05 DIAGNOSIS — G894 Chronic pain syndrome: Secondary | ICD-10-CM | POA: Diagnosis not present

## 2012-08-05 DIAGNOSIS — Z8719 Personal history of other diseases of the digestive system: Secondary | ICD-10-CM | POA: Diagnosis not present

## 2012-08-05 DIAGNOSIS — L0291 Cutaneous abscess, unspecified: Secondary | ICD-10-CM | POA: Diagnosis not present

## 2012-08-05 DIAGNOSIS — T8189XA Other complications of procedures, not elsewhere classified, initial encounter: Secondary | ICD-10-CM | POA: Diagnosis not present

## 2012-08-06 DIAGNOSIS — R5381 Other malaise: Secondary | ICD-10-CM | POA: Diagnosis not present

## 2012-08-06 DIAGNOSIS — N760 Acute vaginitis: Secondary | ICD-10-CM | POA: Diagnosis not present

## 2012-08-06 DIAGNOSIS — L659 Nonscarring hair loss, unspecified: Secondary | ICD-10-CM | POA: Diagnosis not present

## 2012-08-06 DIAGNOSIS — Z79899 Other long term (current) drug therapy: Secondary | ICD-10-CM | POA: Diagnosis not present

## 2012-08-06 DIAGNOSIS — M6281 Muscle weakness (generalized): Secondary | ICD-10-CM | POA: Diagnosis not present

## 2012-08-06 DIAGNOSIS — L0291 Cutaneous abscess, unspecified: Secondary | ICD-10-CM | POA: Diagnosis not present

## 2012-08-06 DIAGNOSIS — F329 Major depressive disorder, single episode, unspecified: Secondary | ICD-10-CM | POA: Diagnosis not present

## 2012-08-06 DIAGNOSIS — K219 Gastro-esophageal reflux disease without esophagitis: Secondary | ICD-10-CM | POA: Diagnosis not present

## 2012-08-06 DIAGNOSIS — I10 Essential (primary) hypertension: Secondary | ICD-10-CM | POA: Diagnosis not present

## 2012-08-06 DIAGNOSIS — E119 Type 2 diabetes mellitus without complications: Secondary | ICD-10-CM | POA: Diagnosis not present

## 2012-08-06 DIAGNOSIS — T8189XA Other complications of procedures, not elsewhere classified, initial encounter: Secondary | ICD-10-CM | POA: Diagnosis not present

## 2012-08-06 DIAGNOSIS — G894 Chronic pain syndrome: Secondary | ICD-10-CM | POA: Diagnosis not present

## 2012-08-06 DIAGNOSIS — Z8719 Personal history of other diseases of the digestive system: Secondary | ICD-10-CM | POA: Diagnosis not present

## 2012-08-07 DIAGNOSIS — L0390 Cellulitis, unspecified: Secondary | ICD-10-CM | POA: Diagnosis not present

## 2012-08-07 DIAGNOSIS — G894 Chronic pain syndrome: Secondary | ICD-10-CM | POA: Diagnosis not present

## 2012-08-07 DIAGNOSIS — Z8719 Personal history of other diseases of the digestive system: Secondary | ICD-10-CM | POA: Diagnosis not present

## 2012-08-07 DIAGNOSIS — F329 Major depressive disorder, single episode, unspecified: Secondary | ICD-10-CM | POA: Diagnosis not present

## 2012-08-07 DIAGNOSIS — T8189XA Other complications of procedures, not elsewhere classified, initial encounter: Secondary | ICD-10-CM | POA: Diagnosis not present

## 2012-08-07 DIAGNOSIS — M6281 Muscle weakness (generalized): Secondary | ICD-10-CM | POA: Diagnosis not present

## 2012-08-08 DIAGNOSIS — T8189XA Other complications of procedures, not elsewhere classified, initial encounter: Secondary | ICD-10-CM | POA: Diagnosis not present

## 2012-08-08 DIAGNOSIS — M6281 Muscle weakness (generalized): Secondary | ICD-10-CM | POA: Diagnosis not present

## 2012-08-08 DIAGNOSIS — G894 Chronic pain syndrome: Secondary | ICD-10-CM | POA: Diagnosis not present

## 2012-08-08 DIAGNOSIS — Z8719 Personal history of other diseases of the digestive system: Secondary | ICD-10-CM | POA: Diagnosis not present

## 2012-08-08 DIAGNOSIS — L0390 Cellulitis, unspecified: Secondary | ICD-10-CM | POA: Diagnosis not present

## 2012-08-08 DIAGNOSIS — F329 Major depressive disorder, single episode, unspecified: Secondary | ICD-10-CM | POA: Diagnosis not present

## 2012-08-09 DIAGNOSIS — F329 Major depressive disorder, single episode, unspecified: Secondary | ICD-10-CM | POA: Diagnosis not present

## 2012-08-09 DIAGNOSIS — T8189XA Other complications of procedures, not elsewhere classified, initial encounter: Secondary | ICD-10-CM | POA: Diagnosis not present

## 2012-08-09 DIAGNOSIS — M6281 Muscle weakness (generalized): Secondary | ICD-10-CM | POA: Diagnosis not present

## 2012-08-09 DIAGNOSIS — Z8719 Personal history of other diseases of the digestive system: Secondary | ICD-10-CM | POA: Diagnosis not present

## 2012-08-09 DIAGNOSIS — G894 Chronic pain syndrome: Secondary | ICD-10-CM | POA: Diagnosis not present

## 2012-08-09 DIAGNOSIS — L0291 Cutaneous abscess, unspecified: Secondary | ICD-10-CM | POA: Diagnosis not present

## 2012-08-11 DIAGNOSIS — T8189XA Other complications of procedures, not elsewhere classified, initial encounter: Secondary | ICD-10-CM | POA: Diagnosis not present

## 2012-08-11 DIAGNOSIS — F329 Major depressive disorder, single episode, unspecified: Secondary | ICD-10-CM | POA: Diagnosis not present

## 2012-08-11 DIAGNOSIS — Z8719 Personal history of other diseases of the digestive system: Secondary | ICD-10-CM | POA: Diagnosis not present

## 2012-08-11 DIAGNOSIS — L0390 Cellulitis, unspecified: Secondary | ICD-10-CM | POA: Diagnosis not present

## 2012-08-11 DIAGNOSIS — G894 Chronic pain syndrome: Secondary | ICD-10-CM | POA: Diagnosis not present

## 2012-08-11 DIAGNOSIS — M6281 Muscle weakness (generalized): Secondary | ICD-10-CM | POA: Diagnosis not present

## 2012-08-12 DIAGNOSIS — T8189XA Other complications of procedures, not elsewhere classified, initial encounter: Secondary | ICD-10-CM | POA: Diagnosis not present

## 2012-08-12 DIAGNOSIS — L0291 Cutaneous abscess, unspecified: Secondary | ICD-10-CM | POA: Diagnosis not present

## 2012-08-12 DIAGNOSIS — F329 Major depressive disorder, single episode, unspecified: Secondary | ICD-10-CM | POA: Diagnosis not present

## 2012-08-12 DIAGNOSIS — M6281 Muscle weakness (generalized): Secondary | ICD-10-CM | POA: Diagnosis not present

## 2012-08-12 DIAGNOSIS — Z8719 Personal history of other diseases of the digestive system: Secondary | ICD-10-CM | POA: Diagnosis not present

## 2012-08-12 DIAGNOSIS — G894 Chronic pain syndrome: Secondary | ICD-10-CM | POA: Diagnosis not present

## 2012-08-14 ENCOUNTER — Emergency Department (HOSPITAL_COMMUNITY)
Admission: EM | Admit: 2012-08-14 | Discharge: 2012-08-14 | Disposition: A | Discharge status: Home / Self Care | Payer: Medicare Other | Attending: Emergency Medicine | Admitting: Emergency Medicine

## 2012-08-14 DIAGNOSIS — F411 Generalized anxiety disorder: Secondary | ICD-10-CM | POA: Diagnosis not present

## 2012-08-14 DIAGNOSIS — M129 Arthropathy, unspecified: Secondary | ICD-10-CM | POA: Diagnosis not present

## 2012-08-14 DIAGNOSIS — N3289 Other specified disorders of bladder: Secondary | ICD-10-CM | POA: Diagnosis not present

## 2012-08-14 DIAGNOSIS — IMO0001 Reserved for inherently not codable concepts without codable children: Secondary | ICD-10-CM | POA: Diagnosis not present

## 2012-08-14 DIAGNOSIS — F3289 Other specified depressive episodes: Secondary | ICD-10-CM | POA: Insufficient documentation

## 2012-08-14 DIAGNOSIS — T148XXA Other injury of unspecified body region, initial encounter: Secondary | ICD-10-CM

## 2012-08-14 DIAGNOSIS — Y833 Surgical operation with formation of external stoma as the cause of abnormal reaction of the patient, or of later complication, without mention of misadventure at the time of the procedure: Secondary | ICD-10-CM | POA: Insufficient documentation

## 2012-08-14 DIAGNOSIS — Z8742 Personal history of other diseases of the female genital tract: Secondary | ICD-10-CM | POA: Insufficient documentation

## 2012-08-14 DIAGNOSIS — Z87828 Personal history of other (healed) physical injury and trauma: Secondary | ICD-10-CM | POA: Diagnosis not present

## 2012-08-14 DIAGNOSIS — Z8669 Personal history of other diseases of the nervous system and sense organs: Secondary | ICD-10-CM | POA: Diagnosis not present

## 2012-08-14 DIAGNOSIS — I10 Essential (primary) hypertension: Secondary | ICD-10-CM | POA: Diagnosis not present

## 2012-08-14 DIAGNOSIS — Z7982 Long term (current) use of aspirin: Secondary | ICD-10-CM | POA: Insufficient documentation

## 2012-08-14 DIAGNOSIS — R10819 Abdominal tenderness, unspecified site: Secondary | ICD-10-CM | POA: Insufficient documentation

## 2012-08-14 DIAGNOSIS — Z9889 Other specified postprocedural states: Secondary | ICD-10-CM | POA: Insufficient documentation

## 2012-08-14 DIAGNOSIS — Z87440 Personal history of urinary (tract) infections: Secondary | ICD-10-CM | POA: Diagnosis not present

## 2012-08-14 DIAGNOSIS — K219 Gastro-esophageal reflux disease without esophagitis: Secondary | ICD-10-CM | POA: Insufficient documentation

## 2012-08-14 DIAGNOSIS — G894 Chronic pain syndrome: Secondary | ICD-10-CM | POA: Insufficient documentation

## 2012-08-14 DIAGNOSIS — E119 Type 2 diabetes mellitus without complications: Secondary | ICD-10-CM | POA: Diagnosis not present

## 2012-08-14 DIAGNOSIS — F329 Major depressive disorder, single episode, unspecified: Secondary | ICD-10-CM | POA: Insufficient documentation

## 2012-08-14 DIAGNOSIS — T8140XA Infection following a procedure, unspecified, initial encounter: Secondary | ICD-10-CM | POA: Diagnosis not present

## 2012-08-14 DIAGNOSIS — T819XXA Unspecified complication of procedure, initial encounter: Secondary | ICD-10-CM | POA: Diagnosis not present

## 2012-08-14 LAB — BASIC METABOLIC PANEL
BUN: 12 mg/dL (ref 6–23)
CO2: 34 mEq/L — ABNORMAL HIGH (ref 19–32)
Calcium: 8.9 mg/dL (ref 8.4–10.5)
Chloride: 97 mEq/L (ref 96–112)
Creatinine, Ser: 0.91 mg/dL (ref 0.50–1.10)

## 2012-08-14 LAB — CBC WITH DIFFERENTIAL/PLATELET
Basophils Absolute: 0 10*3/uL (ref 0.0–0.1)
Eosinophils Relative: 2 % (ref 0–5)
HCT: 34.3 % — ABNORMAL LOW (ref 36.0–46.0)
Hemoglobin: 9.8 g/dL — ABNORMAL LOW (ref 12.0–15.0)
Lymphocytes Relative: 19 % (ref 12–46)
MCHC: 28.6 g/dL — ABNORMAL LOW (ref 30.0–36.0)
MCV: 75.9 fL — ABNORMAL LOW (ref 78.0–100.0)
Monocytes Absolute: 0.8 10*3/uL (ref 0.1–1.0)
Monocytes Relative: 9 % (ref 3–12)
RDW: 17.5 % — ABNORMAL HIGH (ref 11.5–15.5)
WBC: 9.5 10*3/uL (ref 4.0–10.5)

## 2012-08-14 NOTE — ED Notes (Addendum)
Reports up this am and noted to open  wound area purple color  with pus noted, odor, & nausea. However, I do not see this.  It is noted skin rash or yeast type odor to left axillary. Denies nausea for now. Unable to explaine pain due to chronic fibromyalgia. Skin is pale is pale & dry. Pulse ox 89% room ai (per patient home health nurse stated pulse ox is normally low 93%). Patient had colostomy surgery done due to constipation. Patient has open wound with packing noted (tan drainage). Area is pink healing granular noted.

## 2012-08-14 NOTE — ED Notes (Signed)
For now patient respiration are even non-laborous, pulse ox 88%-89% on room air @ rest. HR is 76 cardiac monitor showing NSR without ectopics.  Patient does not makes any inquires about her breathing until asked. Conversing in complete sentences, however patient state she feels like she has to stop before completing her sentence due to SOB.

## 2012-08-14 NOTE — ED Provider Notes (Signed)
Case discussed with Dr. Daphine Deutscher plan she is to keep her scheduled appointment with Dr. Maisie Fus. No further ED care needed.  Doug Sou, MD 08/14/12 [phone]

## 2012-08-14 NOTE — ED Provider Notes (Signed)
Medical screening examination/treatment/procedure(s) were conducted as a shared visit with non-physician practitioner(s) and myself.  I personally evaluated the patient during the encounter  Doug Sou, MD 08/14/12 [phone]

## 2012-08-14 NOTE — ED Provider Notes (Signed)
Patient noted drainage from her abdominal wound this morning accompanied by nausea. Denies shortness of breath. On exam alert nontoxic abdomen morbidly obese, open surgical wound with slight amount of yellow drainage at base of wound. Mild tenderness around wound edges Colostomy in place with brown stool in colostomy bag.  Doug Sou, MD 08/14/12 [phone]

## 2012-08-14 NOTE — ED Notes (Signed)
After ox 2 L [place] applied, Pulse ox is 96%.

## 2012-08-14 NOTE — Consult Note (Signed)
Asked to see patient because of tan drainage from midline wound.   The patient was seen by me in [facility] ED 18.  Her midline wound appears to be healing well and there is no surrounding erythema.  Slightly mucoid material in the base of the wound but no enteric contents noted.  Repacked with gauze and redressed.    No indication for antibiotics.  Reassured and asked to return to see Dr. Maisie Fus on her regularly scheduled appointment.  Has home health care coming daily to repack wound.    Impression:  Recent colostomy for colonic perforation.  Wound healing secondarily.  Followup with Dr. Maisie Fus

## 2012-08-14 NOTE — ED Notes (Signed)
MD at bedside. 

## 2012-08-14 NOTE — ED Provider Notes (Signed)
History     CSN: [id number]  Arrival date & time 08/14/12  1247   First MD Initiated Contact with Patient 08/14/12 1416      Chief Complaint  Patient presents with  . Wound Infection    (Consider location/radiation/quality/duration/timing/severity/associated sxs/prior treatment) HPI Comments: Patient with history of perforated colon status post colostomy -- presents with drainage from chronic wound in her lower abdomen. Patient states that she has had an increase in discomfort in that area and that she has noted pain and drainage from the area this morning. The area is also dark purple in color. Area is 3cm in depth per patient. She denies fever. She has had nausea but no vomiting. Normal throughput in colostomy. She is seen by Dr. Maisie Fus of the [facility] surgery. The onset of this condition was acute. The course is constant. Aggravating factors: palpation. Alleviating factors: none.    The history is provided by the patient and medical records.    Past Medical History  Diagnosis Date  . Arthritis   . Depression   . Allergy   . Hypertension   . Migraine   . UTI (lower urinary tract infection)   . Fibromyalgia   . Chronic pain syndrome   . GERD (gastroesophageal reflux disease) 01/21/2011  . Anxiety   . Panic attacks   . High blood pressure   . Knee pain   . Urination frequency   . Diabetes mellitus type II   . Perforation of colon 04/08/2012    Past Surgical History  Procedure Laterality Date  . Cholecystectomy    . Abdominal hysterectomy    . Right ovary removal    . Left hemicolectomy with splenic flexure mobilization and end colostomy  04/04/2012  . Laparotomy  04/04/2012    Procedure: EXPLORATORY LAPAROTOMY;  Surgeon: Romie Levee, MD;  Location: [facility] ORS;  Service: General;  Laterality: N/A;  left colectomy  . Colostomy  04/04/2012    Procedure: COLOSTOMY;  Surgeon: Romie Levee, MD;  Location: [facility] ORS;  Service: General;  Laterality: N/A;    Family History   Problem Relation Age of Onset  . Depression Mother   . Depression Father   . Alcohol abuse Brother   . Depression Brother   . Depression Maternal Grandfather   . Depression Other     History  Substance Use Topics  . Smoking status: Never Smoker   . Smokeless tobacco: Never Used  . Alcohol Use: No    OB History   Grav Para Term Preterm Abortions TAB SAB Ect Mult Living                  Review of Systems  Constitutional: Negative for fever.  HENT: Negative for sore throat and rhinorrhea.   Eyes: Negative for redness.  Respiratory: Negative for cough.   Cardiovascular: Negative for chest pain.  Gastrointestinal: Positive for nausea. Negative for vomiting, abdominal pain and diarrhea.  Genitourinary: Negative for dysuria.  Musculoskeletal: Negative for myalgias.  Skin: Positive for color change and wound. Negative for rash.  Neurological: Negative for headaches.    Allergies  Pregabalin; Saphris; Iodinated diagnostic agents; and Seroquel  Home Medications   Current Outpatient Rx  Name  Route  Sig  Dispense  Refill  . aspirin EC 81 MG tablet   Oral   Take 81 mg by mouth daily.         . cetirizine (ZYRTEC) 10 MG tablet   Oral   Take 10 mg by mouth  daily.          . clonazePAM (KLONOPIN) 0.5 MG tablet   Oral   Take 1 tablet (0.5 mg total) by mouth 3 (three) times daily as needed. For anxiety/rest   30 tablet   0   . escitalopram (LEXAPRO) 20 MG tablet   Oral   Take 30 mg by mouth daily before breakfast. Takes 1 and 1/2 tablet         . Ferrous Gluconate (IRON) 240 (27 FE) MG TABS   Oral   Take 1 tablet by mouth 2 (two) times daily.          . hydroxychloroquine (PLAQUENIL) 200 MG tablet   Oral   Take 200 mg by mouth 2 (two) times daily.          Marland Kitchen morphine (MS CONTIN) 60 MG 12 hr tablet   Oral   Take 60 mg by mouth at bedtime. For pain.         . Multiple Vitamin (MULTIVITAMIN WITH MINERALS) TABS   Oral   Take 1 tablet by mouth  daily.         Marland Kitchen omeprazole (PRILOSEC) 40 MG capsule   Oral   Take 40 mg by mouth daily.         . ondansetron (ZOFRAN) 4 MG tablet   Oral   Take 4 mg by mouth every 8 (eight) hours as needed. For nausea.         Marland Kitchen oxyCODONE-acetaminophen (PERCOCET/ROXICET) 5-325 MG per tablet   Oral   Take 1-2 tablets by mouth every 4 (four) hours as needed. For pain.   30 tablet   0   . polyethylene glycol (MIRALAX / GLYCOLAX) packet   Oral   Take 17 g by mouth daily.         . traZODone (DESYREL) 100 MG tablet   Oral   Take 200 mg by mouth at bedtime.           BP 147/75  Pulse 71  Temp(Src) 98.4 F (36.9 C) (Oral)  Resp 20  SpO2 95%  Physical Exam  Nursing note and vitals reviewed. Constitutional: She appears well-developed and well-nourished.  HENT:  Head: Normocephalic and atraumatic.  Eyes: Conjunctivae are normal. Right eye exhibits no discharge. Left eye exhibits no discharge.  Neck: Normal range of motion. Neck supple.  Cardiovascular: Normal rate, regular rhythm and normal heart sounds.   Pulmonary/Chest: Effort normal and breath sounds normal.  Abdominal: Soft. Bowel sounds are normal. She exhibits no distension. There is tenderness. There is no rebound and no guarding.  3cm deep wound along midline surgical scar. There is tan drainage noted in the wound. Patient is tender to palpation. Normal appearing stool in colostomy.   Musculoskeletal: She exhibits no edema and no tenderness.  Neurological: She is alert.  Skin: Skin is warm and dry.  Psychiatric: She has a normal mood and affect.    ED Course  Procedures (including critical care time)  Labs Reviewed  CBC WITH DIFFERENTIAL - Abnormal; Notable for the following:    Hemoglobin 9.8 (*)    HCT 34.3 (*)    MCV 75.9 (*)    MCH 21.7 (*)    MCHC 28.6 (*)    RDW 17.5 (*)    All other components within normal limits  BASIC METABOLIC PANEL - Abnormal; Notable for the following:    CO2 34 (*)    GFR calc  non Af Amer 69 (*)  GFR calc Af Amer 80 (*)    All other components within normal limits  GLUCOSE, CAPILLARY   No results found.   1. Wound drainage     2:30 PM Patient seen and examined. Patient seen by and discussed with Dr. Ethelda Chick. Will get basic labs, call surgery for reccs.   Vital signs reviewed and are as follows: Filed Vitals:   08/14/12 1256  BP: 147/75  Pulse: 71  Temp: 98.4 F (36.9 C)  Resp: 20   2:56 PM Dr. Daphine Deutscher is in surgery. I spoke with one of the surgical nurses who will relay info to Dr. Daphine Deutscher. I have asked for someone to come look at the wound.   3:47 PM I spoke with Dr. Daphine Deutscher who will come to ED and see shortly.   3:59 PM Dr. Daphine Deutscher has seen. OK for d/c to home. Patient remains well-appearing, no SOB, pain controlled. Only recc is follow-up with Dr. Maisie Fus.    MDM  Patient with chronic abdominal wound with drainage today. Patient has tenderness, but otherwise appears well. Awaiting surgical consult.           Renne Crigler, [place] 08/14/12 1600

## 2012-08-14 NOTE — ED Notes (Signed)
ZOX:[id number]<AV> Expected date:08/14/12<BR> Expected time:12:38 PM<BR> Means of arrival:Ambulance<BR> Comments:<BR> Eval infection around colostomy

## 2012-08-16 DIAGNOSIS — M6281 Muscle weakness (generalized): Secondary | ICD-10-CM | POA: Diagnosis not present

## 2012-08-16 DIAGNOSIS — T8189XA Other complications of procedures, not elsewhere classified, initial encounter: Secondary | ICD-10-CM | POA: Diagnosis not present

## 2012-08-16 DIAGNOSIS — Z8719 Personal history of other diseases of the digestive system: Secondary | ICD-10-CM | POA: Diagnosis not present

## 2012-08-16 DIAGNOSIS — G894 Chronic pain syndrome: Secondary | ICD-10-CM | POA: Diagnosis not present

## 2012-08-16 DIAGNOSIS — L0390 Cellulitis, unspecified: Secondary | ICD-10-CM | POA: Diagnosis not present

## 2012-08-16 DIAGNOSIS — F329 Major depressive disorder, single episode, unspecified: Secondary | ICD-10-CM | POA: Diagnosis not present

## 2012-08-17 ENCOUNTER — Encounter (INDEPENDENT_AMBULATORY_CARE_PROVIDER_SITE_OTHER): Payer: Medicare Other | Admitting: General Surgery

## 2012-08-17 DIAGNOSIS — M6281 Muscle weakness (generalized): Secondary | ICD-10-CM | POA: Diagnosis not present

## 2012-08-17 DIAGNOSIS — Z8719 Personal history of other diseases of the digestive system: Secondary | ICD-10-CM | POA: Diagnosis not present

## 2012-08-17 DIAGNOSIS — L0390 Cellulitis, unspecified: Secondary | ICD-10-CM | POA: Diagnosis not present

## 2012-08-17 DIAGNOSIS — T8189XA Other complications of procedures, not elsewhere classified, initial encounter: Secondary | ICD-10-CM | POA: Diagnosis not present

## 2012-08-17 DIAGNOSIS — F329 Major depressive disorder, single episode, unspecified: Secondary | ICD-10-CM | POA: Diagnosis not present

## 2012-08-17 DIAGNOSIS — G894 Chronic pain syndrome: Secondary | ICD-10-CM | POA: Diagnosis not present

## 2012-08-20 DIAGNOSIS — G894 Chronic pain syndrome: Secondary | ICD-10-CM | POA: Diagnosis not present

## 2012-08-20 DIAGNOSIS — F329 Major depressive disorder, single episode, unspecified: Secondary | ICD-10-CM | POA: Diagnosis not present

## 2012-08-20 DIAGNOSIS — T8189XA Other complications of procedures, not elsewhere classified, initial encounter: Secondary | ICD-10-CM | POA: Diagnosis not present

## 2012-08-20 DIAGNOSIS — L0291 Cutaneous abscess, unspecified: Secondary | ICD-10-CM | POA: Diagnosis not present

## 2012-08-20 DIAGNOSIS — M6281 Muscle weakness (generalized): Secondary | ICD-10-CM | POA: Diagnosis not present

## 2012-08-20 DIAGNOSIS — L0390 Cellulitis, unspecified: Secondary | ICD-10-CM | POA: Diagnosis not present

## 2012-08-20 DIAGNOSIS — Z8719 Personal history of other diseases of the digestive system: Secondary | ICD-10-CM | POA: Diagnosis not present

## 2012-08-24 DIAGNOSIS — L0291 Cutaneous abscess, unspecified: Secondary | ICD-10-CM | POA: Diagnosis not present

## 2012-08-24 DIAGNOSIS — M6281 Muscle weakness (generalized): Secondary | ICD-10-CM | POA: Diagnosis not present

## 2012-08-24 DIAGNOSIS — L0390 Cellulitis, unspecified: Secondary | ICD-10-CM | POA: Diagnosis not present

## 2012-08-24 DIAGNOSIS — T8189XA Other complications of procedures, not elsewhere classified, initial encounter: Secondary | ICD-10-CM | POA: Diagnosis not present

## 2012-08-24 DIAGNOSIS — Z8719 Personal history of other diseases of the digestive system: Secondary | ICD-10-CM | POA: Diagnosis not present

## 2012-08-24 DIAGNOSIS — G894 Chronic pain syndrome: Secondary | ICD-10-CM | POA: Diagnosis not present

## 2012-08-24 DIAGNOSIS — F329 Major depressive disorder, single episode, unspecified: Secondary | ICD-10-CM | POA: Diagnosis not present

## 2012-08-26 DIAGNOSIS — L0291 Cutaneous abscess, unspecified: Secondary | ICD-10-CM | POA: Diagnosis not present

## 2012-08-26 DIAGNOSIS — M6281 Muscle weakness (generalized): Secondary | ICD-10-CM | POA: Diagnosis not present

## 2012-08-26 DIAGNOSIS — F329 Major depressive disorder, single episode, unspecified: Secondary | ICD-10-CM | POA: Diagnosis not present

## 2012-08-26 DIAGNOSIS — T8189XA Other complications of procedures, not elsewhere classified, initial encounter: Secondary | ICD-10-CM | POA: Diagnosis not present

## 2012-08-30 DIAGNOSIS — M6281 Muscle weakness (generalized): Secondary | ICD-10-CM | POA: Diagnosis not present

## 2012-08-30 DIAGNOSIS — F329 Major depressive disorder, single episode, unspecified: Secondary | ICD-10-CM | POA: Diagnosis not present

## 2012-08-30 DIAGNOSIS — T8189XA Other complications of procedures, not elsewhere classified, initial encounter: Secondary | ICD-10-CM | POA: Diagnosis not present

## 2012-08-30 DIAGNOSIS — L0291 Cutaneous abscess, unspecified: Secondary | ICD-10-CM | POA: Diagnosis not present

## 2012-08-30 DIAGNOSIS — G894 Chronic pain syndrome: Secondary | ICD-10-CM | POA: Diagnosis not present

## 2012-08-30 DIAGNOSIS — Z8719 Personal history of other diseases of the digestive system: Secondary | ICD-10-CM | POA: Diagnosis not present

## 2012-08-31 DIAGNOSIS — G894 Chronic pain syndrome: Secondary | ICD-10-CM | POA: Diagnosis not present

## 2012-08-31 DIAGNOSIS — Z8719 Personal history of other diseases of the digestive system: Secondary | ICD-10-CM | POA: Diagnosis not present

## 2012-08-31 DIAGNOSIS — M6281 Muscle weakness (generalized): Secondary | ICD-10-CM | POA: Diagnosis not present

## 2012-08-31 DIAGNOSIS — F329 Major depressive disorder, single episode, unspecified: Secondary | ICD-10-CM | POA: Diagnosis not present

## 2012-08-31 DIAGNOSIS — L0390 Cellulitis, unspecified: Secondary | ICD-10-CM | POA: Diagnosis not present

## 2012-08-31 DIAGNOSIS — T8189XA Other complications of procedures, not elsewhere classified, initial encounter: Secondary | ICD-10-CM | POA: Diagnosis not present

## 2012-09-02 ENCOUNTER — Encounter (INDEPENDENT_AMBULATORY_CARE_PROVIDER_SITE_OTHER): Payer: Medicare Other | Admitting: General Surgery

## 2012-09-02 ENCOUNTER — Encounter (INDEPENDENT_AMBULATORY_CARE_PROVIDER_SITE_OTHER): Payer: Self-pay | Admitting: General Surgery

## 2012-09-02 ENCOUNTER — Ambulatory Visit (INDEPENDENT_AMBULATORY_CARE_PROVIDER_SITE_OTHER): Payer: Medicare Other | Admitting: General Surgery

## 2012-09-02 VITALS — BP 138/80 | HR 72 | Temp 98.9°F | Resp 24 | Ht 63.0 in | Wt 295.0 lb

## 2012-09-02 DIAGNOSIS — T8130XA Disruption of wound, unspecified, initial encounter: Secondary | ICD-10-CM

## 2012-09-02 NOTE — Progress Notes (Signed)
Jasmine Huff is a 57 y.o. female who is here for a follow up visit regarding her ostomy.  She is ~6 mo s/p Hartman's procedure for sigmoid perforation.  She has a midline abd wound that is being packed.  She is having increased drainage.  Objective: Filed Vitals:   09/02/12 1544  BP: 138/80  Pulse: 72  Temp: 98.9 F (37.2 C)  Resp: 24    General appearance: alert, cooperative and no distress GI: soft, obese, ostomy with thickened stool wound: there is a nonhealing tract that angles off a midline for 5-6cm   Assessment and Plan: Cont wound packing, including the deep tract.  I have included instructions for her [facility] RN.  I would like her to start a fiber supplement.  I will see her back in 2 weeks.    Vanita Panda, MD [facility] Surgery, [place] [phone]

## 2012-09-02 NOTE — Patient Instructions (Signed)
There is a pocket of infection that starts at the bottom of the wound and goes towards her lateral sidewall at a 45 degree angle.  This needs to be packed as well.  There is currently iodoform gauze in the tract.    I will see you back in 2 weeks  I would like you to start a fiber supplement.  Below are some suggestions.     GETTING TO GOOD BOWEL HEALTH. Irregular bowel habits such as constipation can lead to many problems over time.  Having one soft bowel movement a day is the most important way to prevent further problems.  The anorectal canal is designed to handle stretching and feces to safely manage our ability to get rid of solid waste (feces, poop, stool) out of our body.  BUT, hard constipated stools can act like ripping concrete bricks causing inflamed hemorrhoids, anal fissures, abdominal pain and bloating.     The goal: ONE SOFT BOWEL MOVEMENT A DAY!  To have soft, regular bowel movements:    Drink at least 8 tall glasses of water a day.     Take plenty of fiber.  Fiber is the undigested part of plant food that passes into the colon, acting s "natures broom" to encourage bowel motility and movement.  Fiber can absorb and hold large amounts of water. This results in a larger, bulkier stool, which is soft and easier to pass. Work gradually over several weeks up to 6 servings a day of fiber (25g a day even more if needed) in the form of: o Vegetables -- Root (potatoes, carrots, turnips), leafy green (lettuce, salad greens, celery, spinach), or cooked high residue (cabbage, broccoli, etc) o Fruit -- Fresh (unpeeled skin & pulp), Dried (prunes, apricots, cherries, etc ),  or stewed ( applesauce)  o Whole grain breads, pasta, etc (whole wheat)  o Bran cereals    Bulking Agents -- This type of water-retaining fiber generally is easily obtained each day by one of the following:  o Psyllium bran -- The psyllium plant is remarkable because its ground seeds can retain so much water. This product is  available as Metamucil, Konsyl, Effersyllium, Per Diem Fiber, or the less expensive generic preparation in drug and health food stores. Although labeled a laxative, it really is not a laxative.  o Methylcellulose -- This is another fiber derived from wood which also retains water. It is available as Citrucel. o Polyethylene Glycol - and "artificial" fiber commonly called Miralax or Glycolax.  It is helpful for people with gassy or bloated feelings with regular fiber o Flax Seed - a less gassy fiber than psyllium   No reading or other relaxing activity while on the toilet. If bowel movements take longer than 5 minutes, you are too constipated   AVOID CONSTIPATION.  High fiber and water intake usually takes care of this.  Sometimes a laxative is needed to stimulate more frequent bowel movements, but    Laxatives are not a good long-term solution as it can wear the colon out. o Osmotics (Milk of Magnesia, Fleets phosphosoda, Magnesium citrate, MiraLax, GoLytely) are safer than  o Stimulants (Senokot, Castor Oil, Dulcolax, Ex Lax)    o Do not take laxatives for more than 7days in a row.    IF SEVERELY CONSTIPATED, try a Bowel Retraining Program: o Do not use laxatives.  o Eat a diet high in roughage, such as bran cereals and leafy vegetables.  o Drink six (6) ounces of prune or apricot  juice each morning.  o Eat two (2) large servings of stewed fruit each day.  o Take one (1) heaping tablespoon of a psyllium-based bulking agent twice a day. Use sugar-free sweetener when possible to avoid excessive calories.  o Eat a normal breakfast.  o Set aside 15 minutes after breakfast to sit on the toilet, but do not strain to have a bowel movement.  o If you do not have a bowel movement by the third day, use an enema and repeat the above steps.

## 2012-09-03 ENCOUNTER — Telehealth (INDEPENDENT_AMBULATORY_CARE_PROVIDER_SITE_OTHER): Payer: Self-pay

## 2012-09-03 NOTE — Telephone Encounter (Signed)
I called and left a vm for Jasmine Huff with wound instructions.

## 2012-09-03 NOTE — Telephone Encounter (Signed)
Message copied by Ivory Broad on Fri Sep 03, 2012  1:06 PM ------      Message from: Larry Sierras      Created: Fri Sep 03, 2012 12:40 PM      Regarding: ORDERS      Contact: [phone]       LAURA WITH [facility]/NEEDS CLARIFICATION ON ORDERS/GM ------

## 2012-09-03 NOTE — Telephone Encounter (Signed)
The pt called to see if her nurse needs to order Iodoform to repack her wound.  I told her yes because Dr Maisie Fus wants the nurse to remove the packing Monday and see how deep it was packed so she can redo it.  So she will need the iodoform to pack it.

## 2012-09-06 DIAGNOSIS — Z8719 Personal history of other diseases of the digestive system: Secondary | ICD-10-CM | POA: Diagnosis not present

## 2012-09-06 DIAGNOSIS — F329 Major depressive disorder, single episode, unspecified: Secondary | ICD-10-CM | POA: Diagnosis not present

## 2012-09-06 DIAGNOSIS — T8189XA Other complications of procedures, not elsewhere classified, initial encounter: Secondary | ICD-10-CM | POA: Diagnosis not present

## 2012-09-06 DIAGNOSIS — M6281 Muscle weakness (generalized): Secondary | ICD-10-CM | POA: Diagnosis not present

## 2012-09-06 DIAGNOSIS — G894 Chronic pain syndrome: Secondary | ICD-10-CM | POA: Diagnosis not present

## 2012-09-06 DIAGNOSIS — L0291 Cutaneous abscess, unspecified: Secondary | ICD-10-CM | POA: Diagnosis not present

## 2012-09-08 ENCOUNTER — Telehealth (INDEPENDENT_AMBULATORY_CARE_PROVIDER_SITE_OTHER): Payer: Self-pay

## 2012-09-08 ENCOUNTER — Telehealth (INDEPENDENT_AMBULATORY_CARE_PROVIDER_SITE_OTHER): Payer: Self-pay | Admitting: General Surgery

## 2012-09-08 DIAGNOSIS — F329 Major depressive disorder, single episode, unspecified: Secondary | ICD-10-CM | POA: Diagnosis not present

## 2012-09-08 DIAGNOSIS — L0291 Cutaneous abscess, unspecified: Secondary | ICD-10-CM | POA: Diagnosis not present

## 2012-09-08 DIAGNOSIS — Z8719 Personal history of other diseases of the digestive system: Secondary | ICD-10-CM | POA: Diagnosis not present

## 2012-09-08 NOTE — Telephone Encounter (Signed)
I called Vernona Rieger and told her to change the dressing daily.  She said the pt is not able to do it herself so they were hoping to do it every other day or so.  She will check with [facility] to see if they can go daily.  I told her that since she had increased drainage and that deep pocket it needs to be changed daily until we see her on 3/14.

## 2012-09-08 NOTE — Telephone Encounter (Signed)
Please call laura from [facility] [phone] she wants to know about the dressing changes if they need to do every day or can do only 3 time a week. Please call back and speak to Vernona Rieger

## 2012-09-08 NOTE — Telephone Encounter (Signed)
The nurse called from the patient's house and reports worsened wound conditions with brown drainage.  There is no fever and no odor.  I informed Dr Maisie Fus and she said to keep packing the tunneled area daily or it will harbor bacteria.   I notified Vernona Rieger.

## 2012-09-09 DIAGNOSIS — G894 Chronic pain syndrome: Secondary | ICD-10-CM | POA: Diagnosis not present

## 2012-09-09 DIAGNOSIS — M6281 Muscle weakness (generalized): Secondary | ICD-10-CM | POA: Diagnosis not present

## 2012-09-09 DIAGNOSIS — B356 Tinea cruris: Secondary | ICD-10-CM | POA: Diagnosis not present

## 2012-09-09 DIAGNOSIS — Z8719 Personal history of other diseases of the digestive system: Secondary | ICD-10-CM | POA: Diagnosis not present

## 2012-09-09 DIAGNOSIS — F329 Major depressive disorder, single episode, unspecified: Secondary | ICD-10-CM | POA: Diagnosis not present

## 2012-09-09 DIAGNOSIS — T8189XA Other complications of procedures, not elsewhere classified, initial encounter: Secondary | ICD-10-CM | POA: Diagnosis not present

## 2012-09-09 DIAGNOSIS — R110 Nausea: Secondary | ICD-10-CM | POA: Diagnosis not present

## 2012-09-09 DIAGNOSIS — L0291 Cutaneous abscess, unspecified: Secondary | ICD-10-CM | POA: Diagnosis not present

## 2012-09-09 DIAGNOSIS — D649 Anemia, unspecified: Secondary | ICD-10-CM | POA: Diagnosis not present

## 2012-09-10 DIAGNOSIS — T8189XA Other complications of procedures, not elsewhere classified, initial encounter: Secondary | ICD-10-CM | POA: Diagnosis not present

## 2012-09-10 DIAGNOSIS — F329 Major depressive disorder, single episode, unspecified: Secondary | ICD-10-CM | POA: Diagnosis not present

## 2012-09-11 DIAGNOSIS — Z8719 Personal history of other diseases of the digestive system: Secondary | ICD-10-CM | POA: Diagnosis not present

## 2012-09-11 DIAGNOSIS — G894 Chronic pain syndrome: Secondary | ICD-10-CM | POA: Diagnosis not present

## 2012-09-11 DIAGNOSIS — F329 Major depressive disorder, single episode, unspecified: Secondary | ICD-10-CM | POA: Diagnosis not present

## 2012-09-11 DIAGNOSIS — T8189XA Other complications of procedures, not elsewhere classified, initial encounter: Secondary | ICD-10-CM | POA: Diagnosis not present

## 2012-09-11 DIAGNOSIS — L0390 Cellulitis, unspecified: Secondary | ICD-10-CM | POA: Diagnosis not present

## 2012-09-11 DIAGNOSIS — M6281 Muscle weakness (generalized): Secondary | ICD-10-CM | POA: Diagnosis not present

## 2012-09-12 DIAGNOSIS — T8189XA Other complications of procedures, not elsewhere classified, initial encounter: Secondary | ICD-10-CM | POA: Diagnosis not present

## 2012-09-12 DIAGNOSIS — Z8719 Personal history of other diseases of the digestive system: Secondary | ICD-10-CM | POA: Diagnosis not present

## 2012-09-12 DIAGNOSIS — M6281 Muscle weakness (generalized): Secondary | ICD-10-CM | POA: Diagnosis not present

## 2012-09-12 DIAGNOSIS — L0291 Cutaneous abscess, unspecified: Secondary | ICD-10-CM | POA: Diagnosis not present

## 2012-09-12 DIAGNOSIS — G894 Chronic pain syndrome: Secondary | ICD-10-CM | POA: Diagnosis not present

## 2012-09-12 DIAGNOSIS — F329 Major depressive disorder, single episode, unspecified: Secondary | ICD-10-CM | POA: Diagnosis not present

## 2012-09-13 ENCOUNTER — Encounter (HOSPITAL_COMMUNITY): Payer: Self-pay | Admitting: *Deleted

## 2012-09-13 ENCOUNTER — Emergency Department (HOSPITAL_COMMUNITY): Payer: Medicare Other

## 2012-09-13 ENCOUNTER — Inpatient Hospital Stay (HOSPITAL_COMMUNITY)
Admission: EM | Admit: 2012-09-13 | Discharge: 2012-09-15 | DRG: 292 | Disposition: A | Discharge status: Home / Self Care | Payer: Medicare Other | Attending: Internal Medicine | Admitting: Internal Medicine

## 2012-09-13 DIAGNOSIS — J189 Pneumonia, unspecified organism: Secondary | ICD-10-CM

## 2012-09-13 DIAGNOSIS — E860 Dehydration: Secondary | ICD-10-CM

## 2012-09-13 DIAGNOSIS — I5031 Acute diastolic (congestive) heart failure: Secondary | ICD-10-CM | POA: Diagnosis present

## 2012-09-13 DIAGNOSIS — E662 Morbid (severe) obesity with alveolar hypoventilation: Secondary | ICD-10-CM | POA: Diagnosis present

## 2012-09-13 DIAGNOSIS — B370 Candidal stomatitis: Secondary | ICD-10-CM

## 2012-09-13 DIAGNOSIS — Z7982 Long term (current) use of aspirin: Secondary | ICD-10-CM | POA: Diagnosis not present

## 2012-09-13 DIAGNOSIS — M129 Arthropathy, unspecified: Secondary | ICD-10-CM | POA: Diagnosis present

## 2012-09-13 DIAGNOSIS — Z6841 Body Mass Index (BMI) 40.0 and over, adult: Secondary | ICD-10-CM | POA: Diagnosis not present

## 2012-09-13 DIAGNOSIS — R0602 Shortness of breath: Secondary | ICD-10-CM | POA: Diagnosis not present

## 2012-09-13 DIAGNOSIS — I10 Essential (primary) hypertension: Secondary | ICD-10-CM

## 2012-09-13 DIAGNOSIS — F411 Generalized anxiety disorder: Secondary | ICD-10-CM | POA: Diagnosis present

## 2012-09-13 DIAGNOSIS — L0390 Cellulitis, unspecified: Secondary | ICD-10-CM

## 2012-09-13 DIAGNOSIS — G8929 Other chronic pain: Secondary | ICD-10-CM | POA: Diagnosis not present

## 2012-09-13 DIAGNOSIS — G4733 Obstructive sleep apnea (adult) (pediatric): Secondary | ICD-10-CM

## 2012-09-13 DIAGNOSIS — I509 Heart failure, unspecified: Secondary | ICD-10-CM | POA: Diagnosis present

## 2012-09-13 DIAGNOSIS — Z933 Colostomy status: Secondary | ICD-10-CM

## 2012-09-13 DIAGNOSIS — Z8719 Personal history of other diseases of the digestive system: Secondary | ICD-10-CM | POA: Diagnosis not present

## 2012-09-13 DIAGNOSIS — D649 Anemia, unspecified: Secondary | ICD-10-CM

## 2012-09-13 DIAGNOSIS — R7989 Other specified abnormal findings of blood chemistry: Secondary | ICD-10-CM

## 2012-09-13 DIAGNOSIS — R5381 Other malaise: Secondary | ICD-10-CM | POA: Diagnosis not present

## 2012-09-13 DIAGNOSIS — F329 Major depressive disorder, single episode, unspecified: Secondary | ICD-10-CM | POA: Diagnosis not present

## 2012-09-13 DIAGNOSIS — K219 Gastro-esophageal reflux disease without esophagitis: Secondary | ICD-10-CM

## 2012-09-13 DIAGNOSIS — F32A Depression, unspecified: Secondary | ICD-10-CM

## 2012-09-13 DIAGNOSIS — T8189XA Other complications of procedures, not elsewhere classified, initial encounter: Secondary | ICD-10-CM | POA: Diagnosis present

## 2012-09-13 DIAGNOSIS — E785 Hyperlipidemia, unspecified: Secondary | ICD-10-CM

## 2012-09-13 DIAGNOSIS — G4700 Insomnia, unspecified: Secondary | ICD-10-CM

## 2012-09-13 DIAGNOSIS — N179 Acute kidney failure, unspecified: Secondary | ICD-10-CM

## 2012-09-13 DIAGNOSIS — A419 Sepsis, unspecified organism: Secondary | ICD-10-CM

## 2012-09-13 DIAGNOSIS — R0902 Hypoxemia: Secondary | ICD-10-CM

## 2012-09-13 DIAGNOSIS — R0989 Other specified symptoms and signs involving the circulatory and respiratory systems: Secondary | ICD-10-CM | POA: Diagnosis not present

## 2012-09-13 DIAGNOSIS — K631 Perforation of intestine (nontraumatic): Secondary | ICD-10-CM

## 2012-09-13 DIAGNOSIS — R0609 Other forms of dyspnea: Secondary | ICD-10-CM | POA: Diagnosis not present

## 2012-09-13 DIAGNOSIS — G894 Chronic pain syndrome: Secondary | ICD-10-CM | POA: Diagnosis present

## 2012-09-13 DIAGNOSIS — R739 Hyperglycemia, unspecified: Secondary | ICD-10-CM

## 2012-09-13 DIAGNOSIS — R748 Abnormal levels of other serum enzymes: Secondary | ICD-10-CM | POA: Diagnosis present

## 2012-09-13 DIAGNOSIS — E669 Obesity, unspecified: Secondary | ICD-10-CM

## 2012-09-13 DIAGNOSIS — E119 Type 2 diabetes mellitus without complications: Secondary | ICD-10-CM

## 2012-09-13 DIAGNOSIS — F3289 Other specified depressive episodes: Secondary | ICD-10-CM | POA: Diagnosis present

## 2012-09-13 DIAGNOSIS — L0291 Cutaneous abscess, unspecified: Secondary | ICD-10-CM | POA: Diagnosis not present

## 2012-09-13 DIAGNOSIS — M797 Fibromyalgia: Secondary | ICD-10-CM

## 2012-09-13 DIAGNOSIS — IMO0001 Reserved for inherently not codable concepts without codable children: Secondary | ICD-10-CM | POA: Diagnosis present

## 2012-09-13 DIAGNOSIS — Y838 Other surgical procedures as the cause of abnormal reaction of the patient, or of later complication, without mention of misadventure at the time of the procedure: Secondary | ICD-10-CM | POA: Diagnosis present

## 2012-09-13 DIAGNOSIS — I517 Cardiomegaly: Secondary | ICD-10-CM | POA: Diagnosis not present

## 2012-09-13 DIAGNOSIS — F332 Major depressive disorder, recurrent severe without psychotic features: Secondary | ICD-10-CM

## 2012-09-13 DIAGNOSIS — R350 Frequency of micturition: Secondary | ICD-10-CM

## 2012-09-13 DIAGNOSIS — M6281 Muscle weakness (generalized): Secondary | ICD-10-CM | POA: Diagnosis not present

## 2012-09-13 DIAGNOSIS — Z79899 Other long term (current) drug therapy: Secondary | ICD-10-CM

## 2012-09-13 DIAGNOSIS — J95821 Acute postprocedural respiratory failure: Secondary | ICD-10-CM

## 2012-09-13 DIAGNOSIS — R6520 Severe sepsis without septic shock: Secondary | ICD-10-CM

## 2012-09-13 DIAGNOSIS — R5383 Other fatigue: Secondary | ICD-10-CM | POA: Diagnosis not present

## 2012-09-13 LAB — COMPREHENSIVE METABOLIC PANEL
ALT: 13 U/L (ref 0–35)
Alkaline Phosphatase: 96 U/L (ref 39–117)
BUN: 16 mg/dL (ref 6–23)
CO2: 28 mEq/L (ref 19–32)
Chloride: 103 mEq/L (ref 96–112)
GFR calc Af Amer: 86 mL/min — ABNORMAL LOW (ref >90)
Glucose, Bld: 164 mg/dL — ABNORMAL HIGH (ref 70–99)
Potassium: 4.9 mEq/L (ref 3.5–5.1)
Sodium: 141 mEq/L (ref 135–145)
Total Bilirubin: 0.1 mg/dL — ABNORMAL LOW (ref 0.3–1.2)

## 2012-09-13 LAB — CBC WITH DIFFERENTIAL/PLATELET
Hemoglobin: 10.6 g/dL — ABNORMAL LOW (ref 12.0–15.0)
Lymphocytes Relative: 7 % — ABNORMAL LOW (ref 12–46)
Lymphs Abs: 0.8 10*3/uL (ref 0.7–4.0)
MCH: 21.9 pg — ABNORMAL LOW (ref 26.0–34.0)
Monocytes Relative: 4 % (ref 3–12)
Neutro Abs: 10.1 10*3/uL — ABNORMAL HIGH (ref 1.7–7.7)
Neutrophils Relative %: 90 % — ABNORMAL HIGH (ref 43–77)
RBC: 4.83 MIL/uL (ref 3.87–5.11)
WBC: 11.3 10*3/uL — ABNORMAL HIGH (ref 4.0–10.5)

## 2012-09-13 LAB — URINALYSIS, ROUTINE W REFLEX MICROSCOPIC
Glucose, UA: NEGATIVE mg/dL
Hgb urine dipstick: NEGATIVE
Ketones, ur: NEGATIVE mg/dL
Leukocytes, UA: NEGATIVE
Protein, ur: 30 mg/dL — AB
pH: 5 (ref 5.0–8.0)

## 2012-09-13 LAB — URINE MICROSCOPIC-ADD ON

## 2012-09-13 LAB — CG4 I-STAT (LACTIC ACID): Lactic Acid, Venous: 1.39 mmol/L (ref 0.5–2.2)

## 2012-09-13 LAB — LIPASE, BLOOD: Lipase: 11 U/L (ref 11–59)

## 2012-09-13 LAB — PRO B NATRIURETIC PEPTIDE: Pro B Natriuretic peptide (BNP): 1620 pg/mL — ABNORMAL HIGH (ref 0–125)

## 2012-09-13 LAB — TROPONIN I: Troponin I: 0.42 ng/mL (ref <0.30)

## 2012-09-13 MED ORDER — SODIUM CHLORIDE 0.9 % IJ SOLN: 3.0000 mL | Freq: Two times a day (BID) | INTRAMUSCULAR | Status: DC

## 2012-09-13 MED ORDER — FUROSEMIDE 10 MG/ML IJ SOLN
20.0000 mg | Freq: Once | INTRAMUSCULAR | Status: AC
  Administered 2012-09-14: 20 mg via INTRAVENOUS
  Filled 2012-09-13: qty 2

## 2012-09-13 MED ORDER — MORPHINE SULFATE ER 30 MG PO TBCR
60.0000 mg | EXTENDED_RELEASE_TABLET | Freq: Every day | ORAL | Status: DC | PRN
  Administered 2012-09-15: 60 mg via ORAL
  Filled 2012-09-13: qty 2

## 2012-09-13 MED ORDER — FUROSEMIDE 10 MG/ML IJ SOLN
40.0000 mg | Freq: Once | INTRAMUSCULAR | Status: AC
  Administered 2012-09-13: 40 mg via INTRAVENOUS
  Filled 2012-09-13: qty 4

## 2012-09-13 MED ORDER — LORATADINE 10 MG PO TABS
10.0000 mg | ORAL_TABLET | Freq: Every day | ORAL | Status: DC
  Administered 2012-09-14 – 2012-09-15 (×2): 10 mg via ORAL
  Filled 2012-09-13 (×2): qty 1

## 2012-09-13 MED ORDER — ONDANSETRON HCL 4 MG/2ML IJ SOLN
4.0000 mg | Freq: Four times a day (QID) | INTRAMUSCULAR | Status: DC | PRN
  Administered 2012-09-15: 4 mg via INTRAVENOUS
  Filled 2012-09-13: qty 2

## 2012-09-13 MED ORDER — ACETAMINOPHEN 325 MG PO TABS: 650.0000 mg | ORAL_TABLET | Freq: Four times a day (QID) | ORAL | Status: DC | PRN

## 2012-09-13 MED ORDER — POLYETHYLENE GLYCOL 3350 17 G PO PACK
17.0000 g | PACK | Freq: Every day | ORAL | Status: DC
  Administered 2012-09-14 – 2012-09-15 (×2): 17 g via ORAL
  Filled 2012-09-13 (×2): qty 1

## 2012-09-13 MED ORDER — ACETAMINOPHEN 650 MG RE SUPP: 650.0000 mg | Freq: Four times a day (QID) | RECTAL | Status: DC | PRN

## 2012-09-13 MED ORDER — ASPIRIN EC 81 MG PO TBEC
81.0000 mg | DELAYED_RELEASE_TABLET | Freq: Every day | ORAL | Status: DC
  Filled 2012-09-13: qty 1

## 2012-09-13 MED ORDER — ONDANSETRON HCL 4 MG PO TABS
4.0000 mg | ORAL_TABLET | Freq: Three times a day (TID) | ORAL | Status: DC | PRN
  Administered 2012-09-14 – 2012-09-15 (×2): 4 mg via ORAL
  Filled 2012-09-13: qty 1

## 2012-09-13 MED ORDER — TRAZODONE HCL 100 MG PO TABS
200.0000 mg | ORAL_TABLET | Freq: Every day | ORAL | Status: DC
  Administered 2012-09-14 (×2): 200 mg via ORAL
  Filled 2012-09-13 (×3): qty 2

## 2012-09-13 MED ORDER — ENOXAPARIN SODIUM 40 MG/0.4ML ~~LOC~~ SOLN
40.0000 mg | Freq: Every day | SUBCUTANEOUS | Status: DC
  Administered 2012-09-14: 40 mg via SUBCUTANEOUS
  Filled 2012-09-13 (×2): qty 0.4

## 2012-09-13 MED ORDER — HYDROXYCHLOROQUINE SULFATE 200 MG PO TABS
200.0000 mg | ORAL_TABLET | Freq: Two times a day (BID) | ORAL | Status: DC
  Administered 2012-09-14 – 2012-09-15 (×4): 200 mg via ORAL
  Filled 2012-09-13 (×6): qty 1

## 2012-09-13 MED ORDER — ONDANSETRON HCL 4 MG PO TABS
4.0000 mg | ORAL_TABLET | Freq: Four times a day (QID) | ORAL | Status: DC | PRN
  Filled 2012-09-13: qty 1

## 2012-09-13 MED ORDER — TEMAZEPAM 15 MG PO CAPS
45.0000 mg | ORAL_CAPSULE | Freq: Every day | ORAL | Status: DC
  Administered 2012-09-14 (×2): 45 mg via ORAL
  Filled 2012-09-13 (×2): qty 3

## 2012-09-13 MED ORDER — FERROUS SULFATE 325 (65 FE) MG PO TABS
325.0000 mg | ORAL_TABLET | Freq: Two times a day (BID) | ORAL | Status: DC
  Administered 2012-09-14 – 2012-09-15 (×4): 325 mg via ORAL
  Filled 2012-09-13 (×6): qty 1

## 2012-09-13 MED ORDER — SODIUM CHLORIDE 0.9 % IJ SOLN
3.0000 mL | Freq: Two times a day (BID) | INTRAMUSCULAR | Status: DC
  Administered 2012-09-14 – 2012-09-15 (×2): 3 mL via INTRAVENOUS

## 2012-09-13 MED ORDER — ESCITALOPRAM OXALATE 20 MG PO TABS
20.0000 mg | ORAL_TABLET | Freq: Two times a day (BID) | ORAL | Status: DC
  Administered 2012-09-14 – 2012-09-15 (×4): 20 mg via ORAL
  Filled 2012-09-13 (×5): qty 1

## 2012-09-13 MED ORDER — OXYCODONE-ACETAMINOPHEN 5-325 MG PO TABS
1.0000 | ORAL_TABLET | ORAL | Status: DC | PRN
  Administered 2012-09-14: 1 via ORAL
  Administered 2012-09-14 (×2): 2 via ORAL
  Administered 2012-09-14 – 2012-09-15 (×3): 1 via ORAL
  Filled 2012-09-13: qty 1
  Filled 2012-09-13: qty 2
  Filled 2012-09-13 (×3): qty 1
  Filled 2012-09-13: qty 2

## 2012-09-13 NOTE — ED Notes (Signed)
Registration at bedside to register pt.

## 2012-09-13 NOTE — Progress Notes (Signed)
CRITICAL VALUE ALERT  Critical value received:  Troponin 0.42  Date of notification:  09/13/12  Time of notification:  2300  Critical value read back: yes  Nurse who received alert:  Orson Ape  MD notified (1st page):  Schorr  Time of first page:  2315  MD notified (2nd page):  Time of second page:  Responding MD: Schorr  Time MD responded:  [phone]

## 2012-09-13 NOTE — ED Notes (Signed)
[id number]<AV> Expected date:<BR> Expected time:<BR> Means of arrival:<BR> Comments:<BR> 57yo-f/ n/v-?took wrong meds

## 2012-09-13 NOTE — ED Provider Notes (Signed)
History     CSN: [id number]  Arrival date & time 09/13/12  1601   First MD Initiated Contact with Patient 09/13/12 1645      Chief Complaint  Patient presents with  . Weakness    (Consider location/radiation/quality/duration/timing/severity/associated sxs/prior treatment) HPI  The patient presents with concern of generalized weakness.  She is a notable history of recent perforated colon with colostomy placement and open abdominal wound.  She is unable to say when her most recent symptoms began to become more severe.  She now complains of generalized weakness with no new focal pain, but persistent sore abdomen.  No new report of fever or chills.  No new cough.  The patient states that she has baseline dyspnea. She does not use oxygen at baseline.  She has no known intrinsic pulmonary disease. Today, after calling EMS, there an initial evaluation demonstrated that the patient was hypoxic with a saturation of less than 80% on room air.  This improved with supplemental oxygen.  The patient specifically denies chest pain, near syncope.  Past Medical History  Diagnosis Date  . Arthritis   . Depression   . Allergy   . Hypertension   . Migraine   . UTI (lower urinary tract infection)   . Fibromyalgia   . Chronic pain syndrome   . GERD (gastroesophageal reflux disease) 01/21/2011  . Anxiety   . Panic attacks   . High blood pressure   . Knee pain   . Urination frequency   . Diabetes mellitus type II   . Perforation of colon 04/08/2012    Past Surgical History  Procedure Laterality Date  . Cholecystectomy    . Abdominal hysterectomy    . Right ovary removal    . Left hemicolectomy with splenic flexure mobilization and end colostomy  04/04/2012  . Laparotomy  04/04/2012    Procedure: EXPLORATORY LAPAROTOMY;  Surgeon: Romie Levee, MD;  Location: [facility];  Service: General;  Laterality: N/A;  left colectomy  . Colostomy  04/04/2012    Procedure: COLOSTOMY;  Surgeon: Romie Levee, MD;   Location: [facility];  Service: General;  Laterality: N/A;    Family History  Problem Relation Age of Onset  . Depression Mother   . Depression Father   . Alcohol abuse Brother   . Depression Brother   . Depression Maternal Grandfather   . Depression Other     History  Substance Use Topics  . Smoking status: Never Smoker   . Smokeless tobacco: Never Used  . Alcohol Use: No    OB History   Grav Para Term Preterm Abortions TAB SAB Ect Mult Living                  Review of Systems  Constitutional:       Per HPI, otherwise negative  HENT:       Per HPI, otherwise negative  Respiratory:       Per HPI, otherwise negative  Cardiovascular:       Per HPI, otherwise negative  Gastrointestinal: Negative for vomiting.  Endocrine:       Negative aside from HPI  Genitourinary:       Neg aside from HPI   Musculoskeletal:       Per HPI, otherwise negative  Skin: Negative.   Neurological: Negative for syncope.    Allergies  Pregabalin; Saphris; Iodinated diagnostic agents; and Seroquel  Home Medications   Current Outpatient Rx  Name  Route  Sig  Dispense  Refill  . aspirin EC 81 MG tablet   Oral   Take 81 mg by mouth daily.         . cetirizine (ZYRTEC) 10 MG tablet   Oral   Take 10 mg by mouth daily.          Marland Kitchen escitalopram (LEXAPRO) 20 MG tablet   Oral   Take 20 mg by mouth 2 (two) times daily. Take 2 tablets (40mg ) daily.         . Ferrous Gluconate (IRON) 240 (27 FE) MG TABS   Oral   Take 1 tablet by mouth 2 (two) times daily.          . hydroxychloroquine (PLAQUENIL) 200 MG tablet   Oral   Take 200 mg by mouth 2 (two) times daily.          Marland Kitchen morphine (MS CONTIN) 60 MG 12 hr tablet   Oral   Take 60 mg by mouth daily as needed. For pain.         . Multiple Vitamin (MULTIVITAMIN WITH MINERALS) TABS   Oral   Take 1 tablet by mouth daily.         . ondansetron (ZOFRAN) 4 MG tablet   Oral   Take 4 mg by mouth every 8 (eight) hours as needed.  For nausea.         Marland Kitchen oxyCODONE-acetaminophen (PERCOCET/ROXICET) 5-325 MG per tablet   Oral   Take 1-2 tablets by mouth every 4 (four) hours as needed. For pain.   30 tablet   0   . polyethylene glycol (MIRALAX / GLYCOLAX) packet   Oral   Take 17 g by mouth daily.         . temazepam (RESTORIL) 15 MG capsule   Oral   Take 45 mg by mouth at bedtime.         . traZODone (DESYREL) 100 MG tablet   Oral   Take 200 mg by mouth at bedtime.           BP 102/59  Pulse 73  Temp(Src) 98.3 F (36.8 C) (Oral)  Resp 16  SpO2 96%  Physical Exam  Nursing note and vitals reviewed. Constitutional: She appears well-developed and well-nourished.  HENT:  Head: Normocephalic and atraumatic.  Eyes: Conjunctivae are normal. Right eye exhibits no discharge. Left eye exhibits no discharge.  Neck: Normal range of motion. Neck supple.  Cardiovascular: Normal rate, regular rhythm and normal heart sounds.   Pulmonary/Chest: Effort normal and breath sounds normal.  Abdominal: Soft. Bowel sounds are normal. She exhibits no distension. There is tenderness. There is no rebound and no guarding.  3cm deep wound along midline surgical scar. There is tan drainage noted in the wound. Patient is tender to palpation. Normal appearing stool in colostomy.   Musculoskeletal: She exhibits no edema and no tenderness.  Neurological: She is alert.  Skin: Skin is warm and dry.  Psychiatric: She has a normal mood and affect.    ED Course  Procedures (including critical care time)  Labs Reviewed  CBC WITH DIFFERENTIAL - Abnormal; Notable for the following:    WBC 11.3 (*)    Hemoglobin 10.6 (*)    MCH 21.9 (*)    MCHC 28.1 (*)    RDW 19.0 (*)    Neutrophils Relative 90 (*)    Neutro Abs 10.1 (*)    Lymphocytes Relative 7 (*)    All other components within normal limits  COMPREHENSIVE METABOLIC PANEL -  Abnormal; Notable for the following:    Glucose, Bld 164 (*)    Albumin 3.0 (*)    Total  Bilirubin 0.1 (*)    GFR calc non Af Amer 74 (*)    GFR calc Af Amer 86 (*)    All other components within normal limits  LIPASE, BLOOD  PROTIME-INR  URINALYSIS, ROUTINE W REFLEX MICROSCOPIC  CG4 I-STAT (LACTIC ACID)   No results found.   No diagnosis found.  With supple Lanoxin the patient has a saturation of 96%.  This drops 79% on room air.  Abnormal. Cardiac 70 sinus rhythm normal    Date: 09/13/2012  Rate: 70  Rhythm: normal sinus rhythm  QRS Axis: normal  Intervals: normal  ST/T Wave abnormalities: normal  Conduction Disutrbances:none  Narrative Interpretation:   Old EKG Reviewed: none available  Unremarkable.   MDM  The patient presents with fatigue, mild dyspnea.  On exam the patient is hypoxic on room air, which is new.  The patient has no formal diagnosis of congestive heart failure, but she has an elevated ENT, as well as pulmonary congestion on chest x-ray.  Given these findings, she was provided Lasix, admitted for further evaluation and management.  No overt evidence of concurrent infection.      Gerhard Munch, MD 09/13/12 2128

## 2012-09-13 NOTE — ED Notes (Signed)
Floor Unit RN unavailable to take report on pt at this time. 

## 2012-09-13 NOTE — H&P (Addendum)
[facility] History and Physical  Jasmine Huff ZOX:[id number] DOB: [birth year] DOA: 09/13/2012  Referring physician: Dr.Lockwood. PCP: [name], MD  Specialists: None.  Chief Complaint: Hypoxia.  HPI: Jasmine Huff is a 57 y.o. female history of chronic abdominal wound and fibromyalgia and previous history of hypertension and diabetes mellitus type 2 off medications for last few months was referred to the ER by patient's health care nurse after patient was found to be hypoxic. Patient otherwise denies any shortness of breath chest pain or any fever chills or productive cough. Patient has chronic abdominal wound with chronic drainage which patient states has not changed. Denies any nausea vomiting abdominal pain or diarrhea. In the ER patient was found to easily desaturate and was placed on oxygen. Chest x-ray was showing congestion with elevated BNP patient was given Lasix 40 mg IV one dose after which patient had good diuresis and has been admitted for further management.  Review of Systems: As presented in the history of presenting illness nothing else significant.  Past Medical History  Diagnosis Date  . Arthritis   . Depression   . Allergy   . Hypertension   . Migraine   . UTI (lower urinary tract infection)   . Fibromyalgia   . Chronic pain syndrome   . GERD (gastroesophageal reflux disease) 01/21/2011  . Anxiety   . Panic attacks   . High blood pressure   . Knee pain   . Urination frequency   . Diabetes mellitus type II   . Perforation of colon 04/08/2012   Past Surgical History  Procedure Laterality Date  . Cholecystectomy    . Abdominal hysterectomy    . Right ovary removal    . Left hemicolectomy with splenic flexure mobilization and end colostomy  04/04/2012  . Laparotomy  04/04/2012    Procedure: EXPLORATORY LAPAROTOMY;  Surgeon: Romie Levee, MD;  Location: [facility];  Service: General;  Laterality: N/A;  left colectomy  . Colostomy  04/04/2012   Procedure: COLOSTOMY;  Surgeon: Romie Levee, MD;  Location: [facility];  Service: General;  Laterality: N/A;   Social History:  reports that she has never smoked. She has never used smokeless tobacco. She reports that she does not drink alcohol or use illicit drugs. Lives at home. where does patient live--home, ALF, SNF? and with whom if at home? Can do ADLs. Can patient participate in ADLs?  Allergies  Allergen Reactions  . Pregabalin Rash    Alters Mental status, flushing  . Saphris (Asenapine Maleate) Shortness Of Breath and Other (See Comments)    Lips tingle and tongue becomes numb/Akasthesia  . Iodinated Diagnostic Agents     Dye used for CT scans, severe hives  . Seroquel (Quetiapine Fumarate)     Family History  Problem Relation Age of Onset  . Depression Mother   . Depression Father   . Alcohol abuse Brother   . Depression Brother   . Depression Maternal Grandfather   . Depression Other      Prior to Admission medications   Medication Sig Start Date End Date Taking? Authorizing Provider  aspirin EC 81 MG tablet Take 81 mg by mouth daily.   Yes Historical Provider, MD  cetirizine (ZYRTEC) 10 MG tablet Take 10 mg by mouth daily.    Yes Historical Provider, MD  escitalopram (LEXAPRO) 20 MG tablet Take 20 mg by mouth 2 (two) times daily. Take 2 tablets (40mg ) daily.   Yes Historical Provider, MD  Ferrous Gluconate (IRON) 240 (27 FE)  MG TABS Take 1 tablet by mouth 2 (two) times daily.    Yes Historical Provider, MD  hydroxychloroquine (PLAQUENIL) 200 MG tablet Take 200 mg by mouth 2 (two) times daily.  08/04/11  Yes Historical Provider, MD  morphine (MS CONTIN) 60 MG 12 hr tablet Take 60 mg by mouth daily as needed. For pain. 04/30/12  Yes Vassie Loll, MD  Multiple Vitamin (MULTIVITAMIN WITH MINERALS) TABS Take 1 tablet by mouth daily.   Yes Historical Provider, MD  ondansetron (ZOFRAN) 4 MG tablet Take 4 mg by mouth every 8 (eight) hours as needed. For nausea. 03/15/12  Yes  Historical Provider, MD  oxyCODONE-acetaminophen (PERCOCET/ROXICET) 5-325 MG per tablet Take 1-2 tablets by mouth every 4 (four) hours as needed. For pain. 04/30/12  Yes Vassie Loll, MD  polyethylene glycol [facility] / GLYCOLAX) packet Take 17 g by mouth daily.   Yes Historical Provider, MD  temazepam (RESTORIL) 15 MG capsule Take 45 mg by mouth at bedtime.   Yes Historical Provider, MD  traZODone (DESYREL) 100 MG tablet Take 200 mg by mouth at bedtime.   Yes Historical Provider, MD   Physical Exam: Filed Vitals:   09/13/12 1714 09/13/12 1714 09/13/12 2123 09/13/12 2134  BP:   186/119 114/63  Pulse:   63 64  Temp:   98.9 F (37.2 C)   TempSrc:   Oral   Resp:   18 17  SpO2: 74% 96% 99% 98%     General:  Well-built and nourished. Not in acute distress.  Eyes: Anicteric no pallor.  ENT: No discharge from the ears eyes nose and mouth.  Neck: No mass felt. No JVD appreciated.  Cardiovascular: S1-S2 heard regular.  Respiratory: No rhonchi or crepitations.  Abdomen: Soft. Abdominal wound colostomy bag. The dressing appears wet.  Skin: Skin as explained in the abdomen.  Musculoskeletal: No edema or joint effusions.  Psychiatric: Appears normal.  Neurologic: Alert awake oriented to time place and person. Moves all extremities.  Labs on Admission:  Basic Metabolic Panel:  Recent Labs Lab 09/13/12 1719  NA 141  K 4.9  CL 103  CO2 28  GLUCOSE 164*  BUN 16  CREATININE 0.86  CALCIUM 8.6   Liver Function Tests:  Recent Labs Lab 09/13/12 1719  AST 13  ALT 13  ALKPHOS 96  BILITOT 0.1*  PROT 7.5  ALBUMIN 3.0*    Recent Labs Lab 09/13/12 1719  LIPASE 11   No results found for this basename: AMMONIA,  in the last 168 hours CBC:  Recent Labs Lab 09/13/12 1719  WBC 11.3*  NEUTROABS 10.1*  HGB 10.6*  HCT 37.7  MCV 78.1  PLT 291   Cardiac Enzymes: No results found for this basename: CKTOTAL, CKMB, CKMBINDEX, TROPONINI,  in the last 168 hours  BNP  (last 3 results)  Recent Labs  02/23/12 1323 04/07/12 0350 09/13/12 1735  PROBNP 1049.0* 260.8* 1620.0*   CBG: No results found for this basename: GLUCAP,  in the last 168 hours  Radiological Exams on Admission: Dg Chest 2 View  09/13/2012  *RADIOLOGY REPORT*  Clinical Data: Discomfort, dizziness, weakness, history hypertension, fibromyalgia, diabetes  CHEST - 2 VIEW  Comparison: 04/30/2012  Findings: Enlargement of cardiac silhouette with pulmonary vascular congestion. Mediastinal contours stable with mild elongation of thoracic aorta noted. Lungs clear. No pleural effusion or pneumothorax. No acute osseous findings.  IMPRESSION: Enlargement of cardiac silhouette with pulmonary vascular congestion. No acute abnormalities.   Original Report Authenticated By: Ulyses Southward, M.D.  EKG: Independently reviewed. Sinus rhythm.  Assessment/Plan Principal Problem:   Hypoxia Active Problems:   Fibromyalgia   Chronic pain   Anemia   1. Hypoxia - most likely cause could be CHF. At this time I have also ordered cardiac enzymes along with d-dimer. It d-dimer is positive then with CT angiogram of the chest. Patient has had good diuresis with Lasix. I have ordered one more dose in a.m. and further doses to be determined based on patient's clinical condition and labs. Check 2-D echo. 2. Chronic abdominal wound - patient does have discharge and patient states that it has not increased. At this time I have requested wound team consult. Further recommendations according to them. 3. History of diabetes mellitus type 2 and hypertension - presently off medications. 4. Chronic pain and fibromyalgia - continue present medications. 5. Anemia - patient is on iron supplements. Continue iron supplements and closely follow CBC.  Code Status: Full code. Family Communication: None.  Disposition Plan: Admit when patient.   KAKRAKANDY,ARSHAD N. [facility] Pager [phone].  If 7PM-7AM, please contact  night-coverage [email or web address] Password [email or web address] 09/13/2012, 10:45 PM Addendum - patient's troponins and d-dimer has come positive. Patient presently has no chest pain and is not short of breath. I havee placed patient on aspirin full dose and start heparin infusion. Cycle cardiac markers and check CT angiogram of the chest to rule out PE.  Midge Minium.

## 2012-09-13 NOTE — ED Notes (Addendum)
Pt arrives from home where home health nurse called 911 because pt was not feeling well. [facility] reports pts sats on arrival was 70%. NRB applied and sats increased to 90%. Also reports that pt has ostomy due to perforated colon/abd surgery in October. 20G to LAC.

## 2012-09-14 ENCOUNTER — Inpatient Hospital Stay (HOSPITAL_COMMUNITY): Payer: Medicare Other

## 2012-09-14 DIAGNOSIS — I509 Heart failure, unspecified: Secondary | ICD-10-CM

## 2012-09-14 DIAGNOSIS — R7989 Other specified abnormal findings of blood chemistry: Secondary | ICD-10-CM

## 2012-09-14 DIAGNOSIS — I517 Cardiomegaly: Secondary | ICD-10-CM

## 2012-09-14 DIAGNOSIS — R0609 Other forms of dyspnea: Secondary | ICD-10-CM | POA: Diagnosis not present

## 2012-09-14 DIAGNOSIS — R0902 Hypoxemia: Secondary | ICD-10-CM | POA: Diagnosis not present

## 2012-09-14 DIAGNOSIS — D649 Anemia, unspecified: Secondary | ICD-10-CM | POA: Diagnosis not present

## 2012-09-14 DIAGNOSIS — G8929 Other chronic pain: Secondary | ICD-10-CM | POA: Diagnosis not present

## 2012-09-14 DIAGNOSIS — IMO0001 Reserved for inherently not codable concepts without codable children: Secondary | ICD-10-CM | POA: Diagnosis not present

## 2012-09-14 LAB — COMPREHENSIVE METABOLIC PANEL
ALT: 11 U/L (ref 0–35)
Alkaline Phosphatase: 85 U/L (ref 39–117)
BUN: 18 mg/dL (ref 6–23)
Chloride: 100 mEq/L (ref 96–112)
GFR calc Af Amer: 79 mL/min — ABNORMAL LOW (ref >90)
Glucose, Bld: 150 mg/dL — ABNORMAL HIGH (ref 70–99)
Potassium: 4 mEq/L (ref 3.5–5.1)
Sodium: 141 mEq/L (ref 135–145)
Total Bilirubin: 0.1 mg/dL — ABNORMAL LOW (ref 0.3–1.2)
Total Protein: 6.9 g/dL (ref 6.0–8.3)

## 2012-09-14 LAB — CBC WITH DIFFERENTIAL/PLATELET
Basophils Absolute: 0 10*3/uL (ref 0.0–0.1)
Basophils Relative: 0 % (ref 0–1)
Eosinophils Relative: 0 % (ref 0–5)
HCT: 33.7 % — ABNORMAL LOW (ref 36.0–46.0)
Lymphocytes Relative: 13 % (ref 12–46)
MCHC: 29.1 g/dL — ABNORMAL LOW (ref 30.0–36.0)
MCV: 76.8 fL — ABNORMAL LOW (ref 78.0–100.0)
Monocytes Absolute: 0.9 10*3/uL (ref 0.1–1.0)
Neutro Abs: 7.6 10*3/uL (ref 1.7–7.7)
Platelets: 282 10*3/uL (ref 150–400)
RDW: 19 % — ABNORMAL HIGH (ref 11.5–15.5)
WBC: 9.8 10*3/uL (ref 4.0–10.5)

## 2012-09-14 LAB — HEPARIN LEVEL (UNFRACTIONATED)
Heparin Unfractionated: 0.18 IU/mL — ABNORMAL LOW (ref 0.30–0.70)
Heparin Unfractionated: 0.32 IU/mL (ref 0.30–0.70)

## 2012-09-14 LAB — TSH: TSH: 2.488 u[IU]/mL (ref 0.350–4.500)

## 2012-09-14 LAB — TROPONIN I: Troponin I: 0.69 ng/mL (ref <0.30)

## 2012-09-14 MED ORDER — HEPARIN (PORCINE) IN NACL 100-0.45 UNIT/ML-% IJ SOLN
1400.0000 [IU]/h | INTRAMUSCULAR | Status: DC
  Administered 2012-09-15: 1400 [IU]/h via INTRAVENOUS
  Filled 2012-09-14 (×2): qty 250

## 2012-09-14 MED ORDER — HEPARIN BOLUS VIA INFUSION
2000.0000 [IU] | Freq: Once | INTRAVENOUS | Status: AC
  Administered 2012-09-14: 2000 [IU] via INTRAVENOUS
  Filled 2012-09-14: qty 2000

## 2012-09-14 MED ORDER — HEPARIN (PORCINE) IN NACL 100-0.45 UNIT/ML-% IJ SOLN
14.0000 [IU]/kg/h | INTRAMUSCULAR | Status: DC
  Administered 2012-09-14: 14 [IU]/kg/h via INTRAVENOUS
  Filled 2012-09-14: qty 250

## 2012-09-14 MED ORDER — ASPIRIN EC 325 MG PO TBEC
325.0000 mg | DELAYED_RELEASE_TABLET | Freq: Every day | ORAL | Status: DC
  Administered 2012-09-14 – 2012-09-15 (×2): 325 mg via ORAL
  Filled 2012-09-14 (×4): qty 1

## 2012-09-14 MED ORDER — TECHNETIUM TO 99M ALBUMIN AGGREGATED
6.0000 | Freq: Once | INTRAVENOUS | Status: AC | PRN
  Administered 2012-09-14: 6 via INTRAVENOUS

## 2012-09-14 MED ORDER — TECHNETIUM TC 99M DIETHYLENETRIAME-PENTAACETIC ACID: 44.0000 | Freq: Once | INTRAVENOUS | Status: AC | PRN

## 2012-09-14 NOTE — Progress Notes (Signed)
ANTICOAGULATION CONSULT NOTE - Initial Consult  Pharmacy Consult for Heparin Indication: chest pain/ACS  Allergies  Allergen Reactions  . Pregabalin Rash    Alters Mental status, flushing  . Saphris (Asenapine Maleate) Shortness Of Breath and Other (See Comments)    Lips tingle and tongue becomes numb/Akasthesia  . Iodinated Diagnostic Agents     Dye used for CT scans, severe hives  . Seroquel (Quetiapine Fumarate)     Patient Measurements: Height: 5\' 3"  (160 cm) Weight: 279 lb (126.554 kg) IBW/kg (Calculated) : 52.4 Heparin Dosing Weight:   Vital Signs: Temp: 99 F (37.2 C) (03/10 2316) Temp src: Oral (03/10 2316) BP: 145/78 mmHg (03/10 2316) Pulse Rate: 100 (03/10 2316)  Labs:  Recent Labs  09/13/12 1719 09/13/12 2225 09/14/12 0230  HGB 10.6*  --  9.8*  HCT 37.7  --  33.7*  PLT 291  --  282  LABPROT 13.2  --   --   INR 1.01  --   --   CREATININE 0.86  --  0.92  TROPONINI  --  0.42* 0.74*    Estimated Creatinine Clearance: 88.5 ml/min (by C-G formula based on Cr of 0.92).   Medical History: Past Medical History  Diagnosis Date  . Arthritis   . Depression   . Allergy   . Hypertension   . Migraine   . UTI (lower urinary tract infection)   . Fibromyalgia   . Chronic pain syndrome   . GERD (gastroesophageal reflux disease) 01/21/2011  . Anxiety   . Panic attacks   . High blood pressure   . Knee pain   . Urination frequency   . Diabetes mellitus type II   . Perforation of colon 04/08/2012    Medications:  Prescriptions prior to admission  Medication Sig Dispense Refill  . aspirin EC 81 MG tablet Take 81 mg by mouth daily.      . cetirizine (ZYRTEC) 10 MG tablet Take 10 mg by mouth daily.       Marland Kitchen escitalopram (LEXAPRO) 20 MG tablet Take 20 mg by mouth 2 (two) times daily. Take 2 tablets (40mg ) daily.      . Ferrous Gluconate (IRON) 240 (27 FE) MG TABS Take 1 tablet by mouth 2 (two) times daily.       . hydroxychloroquine (PLAQUENIL) 200 MG tablet  Take 200 mg by mouth 2 (two) times daily.       Marland Kitchen morphine (MS CONTIN) 60 MG 12 hr tablet Take 60 mg by mouth daily as needed. For pain.      . Multiple Vitamin (MULTIVITAMIN WITH MINERALS) TABS Take 1 tablet by mouth daily.      . ondansetron (ZOFRAN) 4 MG tablet Take 4 mg by mouth every 8 (eight) hours as needed. For nausea.      Marland Kitchen oxyCODONE-acetaminophen (PERCOCET/ROXICET) 5-325 MG per tablet Take 1-2 tablets by mouth every 4 (four) hours as needed. For pain.  30 tablet  0  . polyethylene glycol (MIRALAX / GLYCOLAX) packet Take 17 g by mouth daily.      . temazepam (RESTORIL) 15 MG capsule Take 45 mg by mouth at bedtime.      . traZODone (DESYREL) 100 MG tablet Take 200 mg by mouth at bedtime.       Infusions:  . heparin      Assessment: Patient with increase CE.  Lovenox 40mg  sq given ~0030 3/11.  Will use adjusted body weight for patient.  Goal of Therapy:  Heparin level 0.3-0.7  units/ml Monitor platelets by anticoagulation protocol: Yes   Plan:  No bolus due to prior lovenox. Heparin ~12 units/kg/hr, heparin level 6hr after drip starts Daily CBC/Heparin level daily   Aleene Davidson Crowford 09/14/2012,5:16 AM

## 2012-09-14 NOTE — Consult Note (Signed)
CARDIOLOGY CONSULT NOTE    Patient ID: Jasmine Huff MRN: [id number] DOB/AGE: Oct 09, 1955 57 y.o.  Admit date: 09/13/2012 Referring Physician:  Benjamine Mola Primary Physician: [name], MD Primary Cardiologist:  Sueanne Margarita before Reason for Consultation: Positive troponin  Principal Problem:   Hypoxia Active Problems:   Fibromyalgia   Chronic pain   Anemia   HPI:  57 yo obese diabetic.  Came to hospital at urging of [facility] who was caring for her abdominal wound.  Thought patient was hypoxic.  Patient indicates " I didn't know I was sick.  Has DM.  Had a perforated colon in October and has had chronic abdominal wound since. Fairly sedentary but she does walk some.  She does not notice much dyspnea. On admission BNP mildly elevated with CXR showing cephalization.  Given diuretics.  V/Q low prob for PE despite clinical likelyhood and elevated d dimer. No history of cardiac disease. Says she had an echo with [facility] within the last 6 months that was ok.  Denies chest pain.  ECG on admission normal.  Echo is pending She says she has seen two doctors and one told her she had sleep apnea and the other not Does not where CPAP.  Denies chronic lung disease or smoking  Severe allergy to contrast dye  @ROS @ All other systems reviewed and negative except as noted above  Past Medical History  Diagnosis Date  . Arthritis   . Depression   . Allergy   . Hypertension   . Migraine   . UTI (lower urinary tract infection)   . Fibromyalgia   . Chronic pain syndrome   . GERD (gastroesophageal reflux disease) 01/21/2011  . Anxiety   . Panic attacks   . High blood pressure   . Knee pain   . Urination frequency   . Diabetes mellitus type II   . Perforation of colon 04/08/2012    Family History  Problem Relation Age of Onset  . Depression Mother   . Depression Father   . Alcohol abuse Brother   . Depression Brother   . Depression Maternal Grandfather   . Depression Other     History   Social  History  . Marital Status: Divorced    Spouse Name: N/A    Number of Children: 0  . Years of Education: N/A   Occupational History  . disabled     Social History Main Topics  . Smoking status: Never Smoker   . Smokeless tobacco: Never Used  . Alcohol Use: No  . Drug Use: No  . Sexually Active: No   Other Topics Concern  . Not on file   Social History Narrative  . No narrative on file    Past Surgical History  Procedure Laterality Date  . Cholecystectomy    . Abdominal hysterectomy    . Right ovary removal    . Left hemicolectomy with splenic flexure mobilization and end colostomy  04/04/2012  . Laparotomy  04/04/2012    Procedure: EXPLORATORY LAPAROTOMY;  Surgeon: Romie Levee, MD;  Location: [facility];  Service: General;  Laterality: N/A;  left colectomy  . Colostomy  04/04/2012    Procedure: COLOSTOMY;  Surgeon: Romie Levee, MD;  Location: [facility];  Service: General;  Laterality: N/A;     . aspirin EC  325 mg Oral Daily  . escitalopram  20 mg Oral BID  . ferrous sulfate  325 mg Oral BID  . hydroxychloroquine  200 mg Oral BID  . loratadine  10  mg Oral Daily  . polyethylene glycol  17 g Oral Daily  . sodium chloride  3 mL Intravenous Q12H  . sodium chloride  3 mL Intravenous Q12H  . temazepam  45 mg Oral QHS  . traZODone  200 mg Oral QHS   . heparin      Physical Exam: Blood pressure 132/82, pulse 67, temperature 98.6 F (37 C), temperature source Oral, resp. rate 16, height 5\' 3"  (1.6 m), weight 278 lb 14.1 oz (126.5 kg), SpO2 100.00%.  Affect appropriate Obese white female in no distress  HEENT: normal Neck supple with no adenopathy JVP normal no bruits no thyromegaly Lungs clear anteriorly with basilar atelectasis Heart:  S1/S2 no murmur, no rub, gallop or click PMI normal Abdomen: S/P colocetomy with wound dressings in place no bruit.  No HSM or HJR Distal pulses intact with no bruits No edema Neuro non-focal Skin warm and dry No muscular  weakness'  Labs:   Lab Results  Component Value Date   WBC 9.8 09/14/2012   HGB 9.8* 09/14/2012   HCT 33.7* 09/14/2012   MCV 76.8* 09/14/2012   PLT 282 09/14/2012    Recent Labs Lab 09/14/12 0230  NA 141  K 4.0  CL 100  CO2 35*  BUN 18  CREATININE 0.92  CALCIUM 8.7  PROT 6.9  BILITOT 0.1*  ALKPHOS 85  ALT 11  AST 11  GLUCOSE 150*   Lab Results  Component Value Date   TROPONINI 0.58* 09/14/2012       Radiology: Dg Chest 2 View  09/13/2012  *RADIOLOGY REPORT*  Clinical Data: Discomfort, dizziness, weakness, history hypertension, fibromyalgia, diabetes  CHEST - 2 VIEW  Comparison: 04/30/2012  Findings: Enlargement of cardiac silhouette with pulmonary vascular congestion. Mediastinal contours stable with mild elongation of thoracic aorta noted. Lungs clear. No pleural effusion or pneumothorax. No acute osseous findings.  IMPRESSION: Enlargement of cardiac silhouette with pulmonary vascular congestion. No acute abnormalities.   Original Report Authenticated By: Ulyses Southward, M.D.    Nm Pulmonary Perf And Vent  09/14/2012  *RADIOLOGY REPORT*  Clinical Data: Difficulty breathing  NM PULMONARY VENTILATION AND PERFUSION SCAN  Views:  Anterior, posterior, right lateral, left lateral, RPO, LPO, RAO, LAO - ventilation and perfusion  Radiopharmaceutical: Technetium 59m DTPA - ventilation; technetium 33m macroaggregated albumin - perfusion  Dose:  44.0 mCi - ventilation; 6.0 mCi - perfusion  Route of administration:  Inhalation - ventilation; intravenous - perfusion  Comparison:  Chest radiograph September 13, 2012  Findings: The ventilation study shows homogeneous and symmetric uptake of radiotracer bilaterally.  The perfusion study shows homogeneous and symmetric uptake of radiotracer bilaterally.  There is no appreciable ventilation / perfusion mismatch.  IMPRESSION: Essentially normal ventilation and perfusion lung scans.  Very low probability of pulmonary embolus.   Original Report Authenticated  By: Bretta Bang, M.D.     EKG: Admission NSR normal ECG   ASSESSMENT AND PLAN:  CHF:  Clinical scenario more consistant with diastolic dysfunction. Echo pending but she indicates it was ok in last 6 months.  ECG normal and no clinical symptoms to suggest DCM.  Would discharge on low dose diuretic and f/u labs with Dr Paulino Rily Consider in hospital nightly oximetry I suspect she would benefit from CPAP.   Elevated Troponin:  Do not appear to be significant.  No ECG changes, no chest pain. Given body habitus and dye allergy dont think myovue indicated as false positive likely Would not pursue ischemic w/u if EF is normal  on echo DM::  Appear to have poor insight into diet A1c 10/21/11 only 6.0 Anemia:  Stable continue iron replacement  Signed: Charlton Haws 09/14/2012, 3:45 PM

## 2012-09-14 NOTE — Progress Notes (Signed)
ANTICOAGULATION CONSULT NOTE - Follow Up Consult  Pharmacy Consult for IV heparin Indication: r/o ACS  Allergies  Allergen Reactions  . Pregabalin Rash    Alters Mental status, flushing  . Saphris (Asenapine Maleate) Shortness Of Breath and Other (See Comments)    Lips tingle and tongue becomes numb/Akasthesia  . Iodinated Diagnostic Agents     Dye used for CT scans, severe hives  . Seroquel (Quetiapine Fumarate)     Patient Measurements: Height: 5\' 3"  (160 cm) Weight: 278 lb 14.1 oz (126.5 kg) IBW/kg (Calculated) : 52.4 Heparin Dosing Weight: 84 kg  Vital Signs: Temp: 98.6 F (37 C) (03/11 2133) Temp src: Oral (03/11 2133) BP: 136/82 mmHg (03/11 2133) Pulse Rate: 69 (03/11 2133)  Labs:  Recent Labs  09/13/12 1719  09/14/12 0230 09/14/12 0805 09/14/12 1315 09/14/12 2045  HGB 10.6*  --  9.8*  --   --   --   HCT 37.7  --  33.7*  --   --   --   PLT 291  --  282  --   --   --   LABPROT 13.2  --   --   --   --   --   INR 1.01  --   --   --   --   --   HEPARINUNFRC  --   --   --   --  0.18* 0.32  CREATININE 0.86  --  0.92  --   --   --   TROPONINI  --   < > 0.74* 0.69* 0.58*  --   < > = values in this interval not displayed.  Estimated Creatinine Clearance: 88.4 ml/min (by C-G formula based on Cr of 0.92).   Assessment: 26 yof with recent perforated colon with colostomy placement and open abd wound (Sept 2013) presented 09/13/12 via EMS with weakness, hypoxia. CXR with congestion and BNP elevated, admit for management (CHF vs PE). Troponin elevated at 0.42, then 0.74. MD ordered IV heparin to r/o ACS, r/o PE   VQ scan with low probability of PE.  Troponin peaked at 0.76.  Hgb 10.6, plts wnl.  No bleeding documented.   IV heparin currently running at 1400 units/hr and heparin level returns therapeutic at 0.32.  Cardiology consulted - noted symptoms likely diastolic dysfunction  ECHO performed tonight   Goal of Therapy:  Heparin level 0.3-0.7 units/ml Monitor  platelets by anticoagulation protocol: Yes   Plan:   Continue IV heparin @ 1400 units/hr (14 ml/hr)  Daily heparin level and CBC  F/u with anticoagulation plans  Terrilee Files, PharmD 09/14/12  9:44 PM

## 2012-09-14 NOTE — Progress Notes (Addendum)
CRITICAL VALUE ALERT  Critical value received:  Troponin 0.74  Date of notification:  09-14-12  Time of notification: 0350  Critical value read back: no (labs did not contact with information, viewed in the chart)  Nurse who received alert:  Orson Ape  MD notified (1st page):  Schorr  Time of first page:  [phone]  New orders written

## 2012-09-14 NOTE — Progress Notes (Signed)
ANTICOAGULATION CONSULT NOTE - Follow Up Consult  Pharmacy Consult for IV heparin Indication: r/o ACS  Allergies  Allergen Reactions  . Pregabalin Rash    Alters Mental status, flushing  . Saphris (Asenapine Maleate) Shortness Of Breath and Other (See Comments)    Lips tingle and tongue becomes numb/Akasthesia  . Iodinated Diagnostic Agents     Dye used for CT scans, severe hives  . Seroquel (Quetiapine Fumarate)     Patient Measurements: Height: 5\' 3"  (160 cm) Weight: 278 lb 14.1 oz (126.5 kg) IBW/kg (Calculated) : 52.4 Heparin Dosing Weight: 84 kg  Vital Signs: Temp: 97.7 F (36.5 C) (03/11 0607) Temp src: Oral (03/11 0607) BP: 100/67 mmHg (03/11 0607) Pulse Rate: 96 (03/11 0607)  Labs:  Recent Labs  09/13/12 1719 09/13/12 2225 09/14/12 0230 09/14/12 0805 09/14/12 1315  HGB 10.6*  --  9.8*  --   --   HCT 37.7  --  33.7*  --   --   PLT 291  --  282  --   --   LABPROT 13.2  --   --   --   --   INR 1.01  --   --   --   --   HEPARINUNFRC  --   --   --   --  0.18*  CREATININE 0.86  --  0.92  --   --   TROPONINI  --  0.42* 0.74* 0.69*  --     Estimated Creatinine Clearance: 88.4 ml/min (by C-G formula based on Cr of 0.92).   Assessment: 75 yof with recent perforated colon with colostomy placement and open abd wound (Sept 2013) presented 09/13/12 via EMS with weakness, hypoxia. CXR with congestion and BNP elevated, admit for management (CHF vs PE). Troponin elevated at 0.42, then 0.74. MD ordered IV heparin to r/o ACS, r/o PE   VQ scan with low probability of PE.  Troponin peaked at 0.76.  Hgb 9.8, plts wnl.  No bleeding documented.   IV heparin currently running at 1150 units/hr and heparin level returns low at 0.18.   Goal of Therapy:  Heparin level 0.3-0.7 units/ml Monitor platelets by anticoagulation protocol: Yes   Plan:   IV heparin 2000 units bolus then increase IV heparin drip to 1400 units/hr (14 ml/hr)  Heparin level at 2100 tonight  Daily  heparin level and CBC  F/u with anticoagulation plans  Geoffry Paradise, PharmD, BCPS Pager: [phone] 2:05 PM Pharmacy #: [id number]

## 2012-09-14 NOTE — Progress Notes (Addendum)
Pt admitted from the ED. Pt's vital signs stable. Pt orientated to the unit and ambulated to the bathroom. Pt on 4L of oxygen by nasal cannula. Pt complains of generalized pain. Pt has no questions or concerns at this time. Pt safety video viewed. MD orders initiated

## 2012-09-14 NOTE — Progress Notes (Signed)
UR completed 

## 2012-09-14 NOTE — Progress Notes (Addendum)
[facility] PROGRESS NOTE  Jasmine Huff WJX:[id number] DOB: 10-27-1955 DOA: 09/13/2012 PCP: [name], MD  Assessment/Plan: Hypoxia - ?CHF vs PE vs combo- BNP elevated, d- dimer elevated, V/Q scan negative. Patient has had good diuresis with Lasix. Check 2-D echo.  Heparin gtt, troponins peaked and are decreasing- no chest pain-- cardiology consulted as V/Q scan negative  Chronic abdominal wound - patient does have discharge and patient states that it has not increased. At this time I have requested wound team consult. Further recommendations according to them.   History of diabetes mellitus type 2 and hypertension - presently off medications.   Chronic pain and fibromyalgia - continue present medications.   Anemia - patient is on iron supplements. Continue iron supplements and closely follow CB  Morbid obesity   Code Status: full Family Communication: patient at bedside Disposition Plan:    Consultants:  none  Procedures:  Await V/Q scan  Antibiotics:    HPI/Subjective: Feeling better, no new c/o, not short of breath  Objective: Filed Vitals:   09/13/12 2134 09/13/12 2316 09/14/12 0500 09/14/12 0607  BP: 114/63 145/78  100/67  Pulse: 64 100  96  Temp:  99 F (37.2 C)  97.7 F (36.5 C)  TempSrc:  Oral  Oral  Resp: 17 18  18   Height:  5\' 3"  (1.6 m)    Weight:  126.554 kg (279 lb) 126.5 kg (278 lb 14.1 oz)   SpO2: 98% 99%  96%    Intake/Output Summary (Last 24 hours) at 09/14/12 1023 Last data filed at 09/14/12 0900  Gross per 24 hour  Intake    820 ml  Output   1750 ml  Net   -930 ml   Filed Weights   09/13/12 2316 09/14/12 0500  Weight: 126.554 kg (279 lb) 126.5 kg (278 lb 14.1 oz)    Exam:   General:  A+Ox3, NAD- O2 was not on face and no increased work of breathing  Cardiovascular: rrr  Respiratory: dec BS due to body habitus  Abdomen: +BS, soft, NT  Musculoskeletal: CN 2-12 intact   Data Reviewed: Basic Metabolic  Panel:  Recent Labs Lab 09/13/12 1719 09/14/12 0230  NA 141 141  K 4.9 4.0  CL 103 100  CO2 28 35*  GLUCOSE 164* 150*  BUN 16 18  CREATININE 0.86 0.92  CALCIUM 8.6 8.7   Liver Function Tests:  Recent Labs Lab 09/13/12 1719 09/14/12 0230  AST 13 11  ALT 13 11  ALKPHOS 96 85  BILITOT 0.1* 0.1*  PROT 7.5 6.9  ALBUMIN 3.0* 2.9*    Recent Labs Lab 09/13/12 1719  LIPASE 11   No results found for this basename: AMMONIA,  in the last 168 hours CBC:  Recent Labs Lab 09/13/12 1719 09/14/12 0230  WBC 11.3* 9.8  NEUTROABS 10.1* 7.6  HGB 10.6* 9.8*  HCT 37.7 33.7*  MCV 78.1 76.8*  PLT 291 282   Cardiac Enzymes:  Recent Labs Lab 09/13/12 2225 09/14/12 0230 09/14/12 0805  TROPONINI 0.42* 0.74* 0.69*   BNP (last 3 results)  Recent Labs  04/07/12 0350 09/13/12 1735 09/14/12 0230  PROBNP 260.8* 1620.0* 1906.0*   CBG:  Recent Labs Lab 09/13/12 2243  GLUCAP 145*    No results found for this or any previous visit (from the past 240 hour(s)).   Studies: Dg Chest 2 View  09/13/2012  *RADIOLOGY REPORT*  Clinical Data: Discomfort, dizziness, weakness, history hypertension, fibromyalgia, diabetes  CHEST - 2 VIEW  Comparison: 04/30/2012  Findings: Enlargement of cardiac silhouette with pulmonary vascular congestion. Mediastinal contours stable with mild elongation of thoracic aorta noted. Lungs clear. No pleural effusion or pneumothorax. No acute osseous findings.  IMPRESSION: Enlargement of cardiac silhouette with pulmonary vascular congestion. No acute abnormalities.   Original Report Authenticated By: Ulyses Southward, M.D.     Scheduled Meds: . aspirin EC  325 mg Oral Daily  . escitalopram  20 mg Oral BID  . ferrous sulfate  325 mg Oral BID  . hydroxychloroquine  200 mg Oral BID  . loratadine  10 mg Oral Daily  . polyethylene glycol  17 g Oral Daily  . sodium chloride  3 mL Intravenous Q12H  . sodium chloride  3 mL Intravenous Q12H  . temazepam  45 mg Oral  QHS  . traZODone  200 mg Oral QHS   Continuous Infusions: . heparin 14 Units/kg/hr (09/14/12 0636)    Principal Problem:   Hypoxia Active Problems:   Fibromyalgia   Chronic pain   Anemia    Time spent: 35    [facility], JESSICA  [facility] Pager [phone]. If 7PM-7AM, please contact night-coverage at [email or web address], password [facility] 09/14/2012, 10:23 AM  LOS: 1 day

## 2012-09-14 NOTE — Progress Notes (Signed)
Spoke with [facility] dept about VQ scan and when the pt. can come.   The Dept states that they are full and will get to her asap.  Dr Benjamine Mola informed.

## 2012-09-14 NOTE — Progress Notes (Signed)
*  PRELIMINARY RESULTS* Echocardiogram 2D Echocardiogram has been performed.  Jasmine Huff 09/14/2012, 4:27 PM

## 2012-09-14 NOTE — Consult Note (Signed)
WOC consult Note Reason for Consult: Consult  requested for colostomy, which was placed 9/13.  Pt states she is independent with ostomy care at home for pouch application and emptying. Mod amt semi-formed stool in pouch.  Current pouch was leaking behind barrier when removed, pt states it has been on several days.  Stoma red and viable, slightly above skin level, 11/4 inches.   Skin denuded with partial thickness skin loss around edges of pouch, appearance consistent with skin stripping from using tape to pouch edges. Applied one piece flexible pouch with barrier ring to maintain seal.  Tegaderm applied to pouch edges to secure.   Discussed pouching routines and ordering supplies with patient. She states she has home health assistance and denies further questions at this time or need for assistance. Supplies left at bedside for patient or staff use. Please re-consult if further assistance is needed.  Thank-you,  Cammie Mcgee MSN, RN, CWOCN, [place], CNS [phone]

## 2012-09-14 NOTE — Progress Notes (Signed)
o2 sat 88% on ra o2 applied o2 sat 96% on2l iters.

## 2012-09-15 ENCOUNTER — Encounter (HOSPITAL_COMMUNITY): Payer: Self-pay | Admitting: Internal Medicine

## 2012-09-15 DIAGNOSIS — E669 Obesity, unspecified: Secondary | ICD-10-CM

## 2012-09-15 DIAGNOSIS — G4733 Obstructive sleep apnea (adult) (pediatric): Secondary | ICD-10-CM | POA: Diagnosis not present

## 2012-09-15 DIAGNOSIS — R7989 Other specified abnormal findings of blood chemistry: Secondary | ICD-10-CM | POA: Diagnosis not present

## 2012-09-15 DIAGNOSIS — R0902 Hypoxemia: Secondary | ICD-10-CM | POA: Diagnosis not present

## 2012-09-15 LAB — BASIC METABOLIC PANEL
Calcium: 8.5 mg/dL (ref 8.4–10.5)
GFR calc non Af Amer: 63 mL/min — ABNORMAL LOW (ref >90)
Glucose, Bld: 109 mg/dL — ABNORMAL HIGH (ref 70–99)
Potassium: 3.8 mEq/L (ref 3.5–5.1)
Sodium: 138 mEq/L (ref 135–145)

## 2012-09-15 LAB — LIPID PANEL
Cholesterol: 120 mg/dL (ref 0–200)
HDL: 46 mg/dL (ref >39)
LDL Cholesterol: 55 mg/dL (ref 0–99)
Triglycerides: 95 mg/dL (ref <150)

## 2012-09-15 LAB — HEPARIN LEVEL (UNFRACTIONATED): Heparin Unfractionated: 0.27 IU/mL — ABNORMAL LOW (ref 0.30–0.70)

## 2012-09-15 LAB — CBC
Hemoglobin: 10.5 g/dL — ABNORMAL LOW (ref 12.0–15.0)
MCH: 22 pg — ABNORMAL LOW (ref 26.0–34.0)
MCHC: 28.8 g/dL — ABNORMAL LOW (ref 30.0–36.0)
Platelets: 294 10*3/uL (ref 150–400)
RBC: 4.78 MIL/uL (ref 3.87–5.11)

## 2012-09-15 MED ORDER — HEPARIN (PORCINE) IN NACL 100-0.45 UNIT/ML-% IJ SOLN
1600.0000 [IU]/h | INTRAMUSCULAR | Status: DC
  Filled 2012-09-15: qty 250

## 2012-09-15 NOTE — Progress Notes (Signed)
ANTICOAGULATION CONSULT NOTE - Follow Up Consult  Pharmacy Consult for heparin Indication: chest pain/ACS  Allergies  Allergen Reactions  . Pregabalin Rash    Alters Mental status, flushing  . Saphris (Asenapine Maleate) Shortness Of Breath and Other (See Comments)    Lips tingle and tongue becomes numb/Akasthesia  . Iodinated Diagnostic Agents     Dye used for CT scans, severe hives  . Seroquel (Quetiapine Fumarate)     Patient Measurements: Height: 5\' 3"  (160 cm) Weight: 298 lb 11.6 oz (135.5 kg) IBW/kg (Calculated) : 52.4 Heparin Dosing Weight:   Vital Signs: Temp: 97 F (36.1 C) (03/12 0500) Temp src: Oral (03/12 0500) BP: 159/83 mmHg (03/12 0500) Pulse Rate: 87 (03/12 0500)  Labs:  Recent Labs  09/13/12 1719  09/14/12 0230 09/14/12 0805 09/14/12 1315 09/14/12 2045 09/15/12 0530  HGB 10.6*  --  9.8*  --   --   --  10.5*  HCT 37.7  --  33.7*  --   --   --  36.4  PLT 291  --  282  --   --   --  294  LABPROT 13.2  --   --   --   --   --   --   INR 1.01  --   --   --   --   --   --   HEPARINUNFRC  --   --   --   --  0.18* 0.32 0.27*  CREATININE 0.86  --  0.92  --   --   --  0.98  TROPONINI  --   < > 0.74* 0.69* 0.58*  --   --   < > = values in this interval not displayed.  Estimated Creatinine Clearance: 86.6 ml/min (by C-G formula based on Cr of 0.98).   Medications:  Infusions:  . heparin 1,600 Units/hr (09/15/12 0616)  . [DISCONTINUED] heparin 14 Units/kg/hr (09/14/12 0636)  . [DISCONTINUED] heparin 1,400 Units/hr (09/15/12 0045)    Assessment: Patient with low heparin level.  No issues with drip per RN.  Goal of Therapy:  Heparin level 0.3-0.7 units/ml Monitor platelets by anticoagulation protocol: Yes   Plan:  Increase to 1600 units/hr, recheck level at [address] [place] Crow[place] 09/15/2012,6:17 AM

## 2012-09-15 NOTE — Discharge Summary (Signed)
Physician Discharge Summary  Jasmine Huff WUJ:[id number] DOB: 05/19/1956 DOA: 09/13/2012  PCP: [name], MD  Admit date: 09/13/2012 Discharge date: 09/15/2012  Recommendations for Outpatient Follow-up:  1. Continue home health services, aid and RN 2. Follow up with cardiology w/i one month due to elevated troponins 3. Follow up with RN for blood pressure checks and if elevated, pt to see her PCP for restarting BP medications 4. Pt to reschedule sleep study  Discharge Diagnoses:  Principal Problem:   Hypoxia Active Problems:   Fibromyalgia   Chronic pain   Anemia   Elevated troponin   Discharge Condition: stable improved  Diet recommendation: healthy heart  Wt Readings from Last 3 Encounters:  09/15/12 135.5 kg (298 lb 11.6 oz)  09/02/12 133.811 kg (295 lb)  07/20/12 138.347 kg (305 lb)    History of present illness:   Jasmine Huff is a 57 y.o. female history of chronic abdominal wound and fibromyalgia and previous history of hypertension and diabetes mellitus type 2 off medications for last few months was referred to the ER by patient's health care nurse after patient was found to be hypoxic. Patient otherwise denies any shortness of breath chest pain or any fever chills or productive cough. Patient has chronic abdominal wound with chronic drainage which patient states has not changed. Denies any nausea vomiting abdominal pain or diarrhea. In the ER patient was found to easily desaturate and was placed on oxygen. Chest x-ray was showing congestion with elevated BNP patient was given Lasix 40 mg IV one dose after which patient had good diuresis and has been admitted for further management.    Hospital Course:   Hypoxia, most likely related to acute diastolic CHF and possibly some OSA/OHS.  V/Q was low risk.  Troponins were elevated to 0.74, and cardiology was consulted who felt that her elevation of troponin was mild.  Given her body habitus and dye allergy, they elected  to monitor medically at this time and not pursue stress test or catheterization and optimize risk factors.  Patient was diuresed with IV lasix 40mg  BID and is ~2L negative and was quickly weaned to room air.  She was able to ambulate with her normal level of dyspnea.  Her ECHO demonstrated no systolic HF but she has some evidence of diastolic dysfunction.  She has mild LVH. Pro-BNP was 1906, higher than her previous.  She should continue to eat a low salt diet.  Her blood pressure was initially normal and then rose somewhat during her stay.  She should continue blood pressure checks at home and call her PCP if her BP remains > 130 SBP or > 80 DBP.  A1c was 6.2.  Lipid panel is pending at the time of discharge and patient is not on a statin.    Procedures:  V/Q  CXR  ECHO  Consultations:  Cardiology  Discharge Exam: Filed Vitals:   09/15/12 0500  BP: 159/83  Pulse: 87  Temp: 97 F (36.1 C)  Resp: 20   Filed Vitals:   09/14/12 1300 09/14/12 1500 09/14/12 2133 09/15/12 0500  BP: 112/78 132/82 136/82 159/83  Pulse: 89 67 69 87  Temp: 98.6 F (37 C)  98.6 F (37 C) 97 F (36.1 C)  TempSrc: Oral  Oral Oral  Resp: 16  18 20   Height:      Weight:    135.5 kg (298 lb 11.6 oz)  SpO2: 100% 100% 98% 95%    General: Obese CF, no acute distress, sitting  on edge of bed without nasal canula HEENT:  MMM, NCAT Cardiovascular: RRR, no mrg, 2+ pulses, warm extremities Respiratory: distant lung sounds, CTAB, no increased WOB ABD:  Oozing from wound on right lower quadrant serosang on dressing.  Soft stool in colostomy bag MSK:  No LEE  Discharge Instructions      Discharge Orders   Future Orders Complete By Expires     (HEART FAILURE PATIENTS) Call MD:  Anytime you have any of the following symptoms: 1) 3 pound weight gain in 24 hours or 5 pounds in 1 week 2) shortness of breath, with or without a dry hacking cough 3) swelling in the hands, feet or stomach 4) if you have to sleep on  extra pillows at night in order to breathe.  As directed     Call MD for:  difficulty breathing, headache or visual disturbances  As directed     Call MD for:  extreme fatigue  As directed     Call MD for:  hives  As directed     Call MD for:  persistant dizziness or light-headedness  As directed     Call MD for:  persistant nausea and vomiting  As directed     Call MD for:  severe uncontrolled pain  As directed     Call MD for:  temperature >100.4  As directed     Diet - low sodium heart healthy  As directed     Discharge instructions  As directed     Comments:      You were hospitalized with low oxygen levels.  You did not have a pulmonary embolism.  You did not have evidence of systolic heart failure but likely DO have some diastolic heart failure which is related to heart stiffness.  This can be the result of diabetes, high blood pressure (even if these are better now).  You were given lasix to help get rid of extra fluid.   Please eat a less than 2gm sodium diet to prevent further fluid retention.  Call your doctor if you have > 3lb weight gain in 1 day or 5lb weight gain in 1 week.  You had some mild strain on the heart with elevated troponin blood tests.  The cardiologists recommended no further evaluation for heart disease at this time, but please follow up with them within the month for further evaluation.  You have some cholesterol tests which are pending and you should have your blood pressure rechecked by your home nurse.  Please contact your doctor if your blood pressure is > 130/80 (either number).  Finally, please reschedule your sleep study.    Discharge wound care:  As directed     Comments:      As before    Increase activity slowly  As directed         Medication List    TAKE these medications       aspirin EC 81 MG tablet  Take 81 mg by mouth daily.     cetirizine 10 MG tablet  Commonly known as:  ZYRTEC  Take 10 mg by mouth daily.     escitalopram 20 MG tablet   Commonly known as:  LEXAPRO  Take 20 mg by mouth 2 (two) times daily. Take 2 tablets (40mg ) daily.     hydroxychloroquine 200 MG tablet  Commonly known as:  PLAQUENIL  Take 200 mg by mouth 2 (two) times daily.     Iron 240 (27 FE) MG  Tabs  Take 1 tablet by mouth 2 (two) times daily.     morphine 60 MG 12 hr tablet  Commonly known as:  MS CONTIN  Take 60 mg by mouth daily as needed. For pain.     multivitamin with minerals Tabs  Take 1 tablet by mouth daily.     ondansetron 4 MG tablet  Commonly known as:  ZOFRAN  Take 4 mg by mouth every 8 (eight) hours as needed. For nausea.     oxyCODONE-acetaminophen 5-325 MG per tablet  Commonly known as:  PERCOCET/ROXICET  Take 1-2 tablets by mouth every 4 (four) hours as needed. For pain.     polyethylene glycol packet  Commonly known as:  MIRALAX / GLYCOLAX  Take 17 g by mouth daily.     temazepam 15 MG capsule  Commonly known as:  RESTORIL  Take 45 mg by mouth at bedtime.     traZODone 100 MG tablet  Commonly known as:  DESYREL  Take 200 mg by mouth at bedtime.       Follow-up Information   Follow up with [name], MD In 1 week. (BP check, rereferral for sleep study if needed)    Contact information:   [address] [place] [address] [phone]       Follow up with Charlton Haws, MD. Schedule an appointment as soon as possible for a visit in 1 month.   Contact information:   [address] [place] [address] [phone]        The results of significant diagnostics from this hospitalization (including imaging, microbiology, ancillary and laboratory) are listed below for reference.    Significant Diagnostic Studies: Dg Chest 2 View  09/13/2012  *RADIOLOGY REPORT*  Clinical Data: Discomfort, dizziness, weakness, history hypertension, fibromyalgia, diabetes  CHEST - 2 VIEW  Comparison: 04/30/2012  Findings: Enlargement of cardiac silhouette with  pulmonary vascular congestion. Mediastinal contours stable with mild elongation of thoracic aorta noted. Lungs clear. No pleural effusion or pneumothorax. No acute osseous findings.  IMPRESSION: Enlargement of cardiac silhouette with pulmonary vascular congestion. No acute abnormalities.   Original Report Authenticated By: Ulyses Southward, M.D.    Nm Pulmonary Perf And Vent  09/14/2012  *RADIOLOGY REPORT*  Clinical Data: Difficulty breathing  NM PULMONARY VENTILATION AND PERFUSION SCAN  Views:  Anterior, posterior, right lateral, left lateral, RPO, LPO, RAO, LAO - ventilation and perfusion  Radiopharmaceutical: Technetium 24m DTPA - ventilation; technetium 18m macroaggregated albumin - perfusion  Dose:  44.0 mCi - ventilation; 6.0 mCi - perfusion  Route of administration:  Inhalation - ventilation; intravenous - perfusion  Comparison:  Chest radiograph September 13, 2012  Findings: The ventilation study shows homogeneous and symmetric uptake of radiotracer bilaterally.  The perfusion study shows homogeneous and symmetric uptake of radiotracer bilaterally.  There is no appreciable ventilation / perfusion mismatch.  IMPRESSION: Essentially normal ventilation and perfusion lung scans.  Very low probability of pulmonary embolus.   Original Report Authenticated By: Bretta Bang, M.D.     Microbiology: No results found for this or any previous visit (from the past 240 hour(s)).   Labs: Basic Metabolic Panel:  Recent Labs Lab 09/13/12 1719 09/14/12 0230 09/15/12 0530  NA 141 141 138  K 4.9 4.0 3.8  CL 103 100 97  CO2 28 35* 36*  GLUCOSE 164* 150* 109*  BUN 16 18 20   CREATININE 0.86 0.92 0.98  CALCIUM 8.6 8.7 8.5   Liver Function Tests:  Recent Labs Lab 09/13/12 1719 09/14/12 0230  AST 13 11  ALT 13 11  ALKPHOS 96 85  BILITOT 0.1* 0.1*  PROT 7.5 6.9  ALBUMIN 3.0* 2.9*    Recent Labs Lab 09/13/12 1719  LIPASE 11   No results found for this basename: AMMONIA,  in the last 168  hours CBC:  Recent Labs Lab 09/13/12 1719 09/14/12 0230 09/15/12 0530  WBC 11.3* 9.8 8.8  NEUTROABS 10.1* 7.6  --   HGB 10.6* 9.8* 10.5*  HCT 37.7 33.7* 36.4  MCV 78.1 76.8* 76.2*  PLT 291 282 294   Cardiac Enzymes:  Recent Labs Lab 09/13/12 2225 09/14/12 0230 09/14/12 0805 09/14/12 1315  TROPONINI 0.42* 0.74* 0.69* 0.58*   BNP: BNP (last 3 results)  Recent Labs  04/07/12 0350 09/13/12 1735 09/14/12 0230  PROBNP 260.8* 1620.0* 1906.0*   CBG:  Recent Labs Lab 09/13/12 2243  GLUCAP 145*    Transthoracic Echocardiography  Patient: Jasmine Huff, Jasmine Huff MR #: [id number] Study Date: 09/14/2012 Gender: F Age: 82 Height: 160cm Weight: 126.4kg BSA: 2.40m^2 Pt. Status: Room: 1520  PERFORMING [place], [facility] [place], [place], Georgetta Haber SONOGRAPHER [place], [place] ATTENDING Marlin Canary cc:  ------------------------------------------------------------ LV EF: 60%  ------------------------------------------------------------ Indications: CHF - 428.0.  ------------------------------------------------------------ History: PMH: Anemia, Fibromyalgia, Hypoxia, GERD, Dehydration, Acute RF Risk factors: Hypertension. Diabetes mellitus.  ------------------------------------------------------------ Study Conclusions  - Left ventricle: The cavity size was normal. Wall thickness was increased in a pattern of mild LVH. The estimated ejection fraction was 60%. Wall motion was normal; there were no regional wall motion abnormalities. Findings consistent with left ventricular diastolic dysfunction. - Right ventricle: The cavity size was normal. Systolic function was normal. Transthoracic echocardiography. M-mode, complete 2D, spectral Doppler, and color Doppler. Height: Height: 160cm. Height: 63in. Weight: Weight: 126.4kg. Weight: 278lb. Body mass index: BMI: 49.3kg/m^2. Body surface area: BSA: 2.88m^2. Blood pressure: 100/67.  Patient status: Inpatient. Location: Bedside.  ------------------------------------------------------------  ------------------------------------------------------------ Left ventricle: The cavity size was normal. Wall thickness was increased in a pattern of mild LVH. The estimated ejection fraction was 60%. Wall motion was normal; there were no regional wall motion abnormalities. Findings consistent with left ventricular diastolic dysfunction.  ------------------------------------------------------------ Aortic valve: Structurally normal valve. Cusp separation was normal. Doppler: Transvalvular velocity was within the normal range. There was no stenosis. No regurgitation.  ------------------------------------------------------------ Aorta: Aortic root: The aortic root was normal in size.  ------------------------------------------------------------ Mitral valve: Structurally normal valve. Leaflet separation was normal. Doppler: Transvalvular velocity was within the normal range. There was no evidence for stenosis. Trivial regurgitation. Peak gradient: 4mm Hg (D).  ------------------------------------------------------------ Left atrium: The atrium was at the upper limits of normal in size.  ------------------------------------------------------------ Right ventricle: The cavity size was normal. Systolic function was normal.  ------------------------------------------------------------ Pulmonic valve: The valve appears to be grossly normal. Doppler: No significant regurgitation.  ------------------------------------------------------------ Tricuspid valve: Structurally normal valve. Leaflet separation was normal. Doppler: Transvalvular velocity was within the normal range. Trivial regurgitation.  ------------------------------------------------------------ Right atrium: The atrium was at the upper limits of normal in  size.  ------------------------------------------------------------ Pericardium: There was no pericardial effusion.  ------------------------------------------------------------ Systemic veins: Inferior vena cava: The vessel was normal in size; the respirophasic diameter changes were in the normal range (= 50%); findings are consistent with normal central venous pressure.  ------------------------------------------------------------  2D measurements Normal Doppler measurements Normal Left ventricle Mitral valve LVID ED, 50 mm 43-52 Peak E vel 102 cm/s ------ chord, Peak A vel 53. cm/s ------ PLAX 7 LVID ES, 36 mm 23-38  Deceleration 254 ms 150-23 chord, time 0 PLAX Peak 4 mm ------ FS, chord, 28 % >29 gradient, D Hg PLAX Peak E/A 1.9 ------ LVPW, ED 15 mm ------ ratio IVS/LVPW 0.8 <1.3 ratio, ED Ventricular septum IVS, ED 12 mm ------ Aorta Root diam, 32 mm ------ ED Left atrium AP dim 40 mm ------ AP dim 1.63 cm/m^2 <2.2 index    Time coordinating discharge: 45 minutes  Signed:  SHORT, MACKENZIE  [facility] 09/15/2012, 11:52 AM

## 2012-09-16 DIAGNOSIS — M6281 Muscle weakness (generalized): Secondary | ICD-10-CM | POA: Diagnosis not present

## 2012-09-16 DIAGNOSIS — T8189XA Other complications of procedures, not elsewhere classified, initial encounter: Secondary | ICD-10-CM | POA: Diagnosis not present

## 2012-09-16 DIAGNOSIS — F329 Major depressive disorder, single episode, unspecified: Secondary | ICD-10-CM | POA: Diagnosis not present

## 2012-09-16 DIAGNOSIS — L0291 Cutaneous abscess, unspecified: Secondary | ICD-10-CM | POA: Diagnosis not present

## 2012-09-16 NOTE — Progress Notes (Signed)
Patient discharged home, alert and oriented, discharge instructions given, patient verbalize understanding of discharge orders given, patient refuse MyChart at this time, patient in stable condition at this time

## 2012-09-17 ENCOUNTER — Ambulatory Visit (INDEPENDENT_AMBULATORY_CARE_PROVIDER_SITE_OTHER): Payer: Medicare Other | Admitting: General Surgery

## 2012-09-17 ENCOUNTER — Encounter (INDEPENDENT_AMBULATORY_CARE_PROVIDER_SITE_OTHER): Payer: Self-pay | Admitting: General Surgery

## 2012-09-17 ENCOUNTER — Encounter (INDEPENDENT_AMBULATORY_CARE_PROVIDER_SITE_OTHER): Payer: Medicare Other | Admitting: General Surgery

## 2012-09-17 VITALS — BP 132/88 | HR 70 | Temp 97.0°F | Resp 18 | Ht 63.0 in | Wt 298.4 lb

## 2012-09-17 DIAGNOSIS — Z5189 Encounter for other specified aftercare: Secondary | ICD-10-CM

## 2012-09-17 DIAGNOSIS — T8189XD Other complications of procedures, not elsewhere classified, subsequent encounter: Secondary | ICD-10-CM

## 2012-09-17 NOTE — Patient Instructions (Signed)
Pack wound daily with 1/4 inch iodoform guaze.  Make sure to pack all the way to the base of the wound.  Cover with a dry dressing.

## 2012-09-17 NOTE — Progress Notes (Signed)
Jasmine Huff is a 57 y.o. female who is here for a follow up visit regarding her abd wall wound.  She was recently hospitalized for hypoxia.  Her BNP was initially elevated but her echo showed normal EF.  She is feeling better now.  She is scheduled for an OSA workup.    Objective: Filed Vitals:   09/17/12 1045  BP: 132/88  Pulse: 70  Temp: 97 F (36.1 C)  Resp: 18    General appearance: alert and cooperative Wound: healing slowly, tract at bottom less deep than 2 weeks ago  Assessment and Plan: Continue wound packing.  Will switch to 1/4 in iodoform.  RTO in 2 wks.    Vanita Panda, MD [facility], [place] [phone]

## 2012-09-18 DIAGNOSIS — L0390 Cellulitis, unspecified: Secondary | ICD-10-CM | POA: Diagnosis not present

## 2012-09-18 DIAGNOSIS — G894 Chronic pain syndrome: Secondary | ICD-10-CM | POA: Diagnosis not present

## 2012-09-18 DIAGNOSIS — T8189XA Other complications of procedures, not elsewhere classified, initial encounter: Secondary | ICD-10-CM | POA: Diagnosis not present

## 2012-09-18 DIAGNOSIS — Z8719 Personal history of other diseases of the digestive system: Secondary | ICD-10-CM | POA: Diagnosis not present

## 2012-09-18 DIAGNOSIS — F329 Major depressive disorder, single episode, unspecified: Secondary | ICD-10-CM | POA: Diagnosis not present

## 2012-09-18 DIAGNOSIS — M6281 Muscle weakness (generalized): Secondary | ICD-10-CM | POA: Diagnosis not present

## 2012-09-19 DIAGNOSIS — T8189XA Other complications of procedures, not elsewhere classified, initial encounter: Secondary | ICD-10-CM | POA: Diagnosis not present

## 2012-09-19 DIAGNOSIS — F329 Major depressive disorder, single episode, unspecified: Secondary | ICD-10-CM | POA: Diagnosis not present

## 2012-09-19 DIAGNOSIS — Z8719 Personal history of other diseases of the digestive system: Secondary | ICD-10-CM | POA: Diagnosis not present

## 2012-09-19 DIAGNOSIS — L0390 Cellulitis, unspecified: Secondary | ICD-10-CM | POA: Diagnosis not present

## 2012-09-19 DIAGNOSIS — M6281 Muscle weakness (generalized): Secondary | ICD-10-CM | POA: Diagnosis not present

## 2012-09-19 DIAGNOSIS — G894 Chronic pain syndrome: Secondary | ICD-10-CM | POA: Diagnosis not present

## 2012-09-20 DIAGNOSIS — I509 Heart failure, unspecified: Secondary | ICD-10-CM | POA: Diagnosis not present

## 2012-09-20 DIAGNOSIS — I519 Heart disease, unspecified: Secondary | ICD-10-CM | POA: Diagnosis not present

## 2012-09-20 DIAGNOSIS — N951 Menopausal and female climacteric states: Secondary | ICD-10-CM | POA: Diagnosis not present

## 2012-09-20 DIAGNOSIS — D649 Anemia, unspecified: Secondary | ICD-10-CM | POA: Diagnosis not present

## 2012-09-20 DIAGNOSIS — E662 Morbid (severe) obesity with alveolar hypoventilation: Secondary | ICD-10-CM | POA: Diagnosis not present

## 2012-09-21 DIAGNOSIS — Z8719 Personal history of other diseases of the digestive system: Secondary | ICD-10-CM | POA: Diagnosis not present

## 2012-09-21 DIAGNOSIS — G894 Chronic pain syndrome: Secondary | ICD-10-CM | POA: Diagnosis not present

## 2012-09-21 DIAGNOSIS — M6281 Muscle weakness (generalized): Secondary | ICD-10-CM | POA: Diagnosis not present

## 2012-09-21 DIAGNOSIS — L0291 Cutaneous abscess, unspecified: Secondary | ICD-10-CM | POA: Diagnosis not present

## 2012-09-21 DIAGNOSIS — T8189XA Other complications of procedures, not elsewhere classified, initial encounter: Secondary | ICD-10-CM | POA: Diagnosis not present

## 2012-09-21 DIAGNOSIS — F329 Major depressive disorder, single episode, unspecified: Secondary | ICD-10-CM | POA: Diagnosis not present

## 2012-09-22 ENCOUNTER — Telehealth (INDEPENDENT_AMBULATORY_CARE_PROVIDER_SITE_OTHER): Payer: Self-pay

## 2012-09-22 DIAGNOSIS — M545 Low back pain: Secondary | ICD-10-CM | POA: Diagnosis not present

## 2012-09-22 DIAGNOSIS — G8929 Other chronic pain: Secondary | ICD-10-CM | POA: Diagnosis not present

## 2012-09-22 DIAGNOSIS — F329 Major depressive disorder, single episode, unspecified: Secondary | ICD-10-CM | POA: Diagnosis not present

## 2012-09-22 DIAGNOSIS — M069 Rheumatoid arthritis, unspecified: Secondary | ICD-10-CM | POA: Diagnosis not present

## 2012-09-22 DIAGNOSIS — L0291 Cutaneous abscess, unspecified: Secondary | ICD-10-CM | POA: Diagnosis not present

## 2012-09-22 DIAGNOSIS — Z8719 Personal history of other diseases of the digestive system: Secondary | ICD-10-CM | POA: Diagnosis not present

## 2012-09-22 NOTE — Telephone Encounter (Signed)
Message copied by Ivory Broad on Wed Sep 22, 2012  2:09 PM ------      Message from: Larry Sierras      Created: Wed Sep 22, 2012  1:02 PM      Contact: [phone]       LAURA WITH [facility]/RE: TX PLAN FOR PT/DECREASE VISITS TO 2 TIMES PER WEEK?/GM ------

## 2012-09-22 NOTE — Telephone Encounter (Signed)
I called Jasmine Huff back and told her Dr Maisie Fus wants the wound packed daily with 1/4 in Iodoform packed down into the deep tunnel.  We see her back in 2 weeks.  Jasmine Huff states the pt is comfortable packing this herself and they are going out just twice a week because the insurance won't cover daily.  I said that is fine as long as the pt can do it and get the packing down in there.  She said she can and they have been working on it.

## 2012-09-24 ENCOUNTER — Telehealth (INDEPENDENT_AMBULATORY_CARE_PROVIDER_SITE_OTHER): Payer: Self-pay

## 2012-09-24 NOTE — Telephone Encounter (Signed)
Jasmine Huff at [facility] called wanting to be sure they had order to continue care twice a week. I reviewed the 09-22-12 note from [place] with Jasmine Huff.

## 2012-09-24 NOTE — Telephone Encounter (Signed)
Patient called in tearful because she can't arrange a ride for when Dr Maisie Fus is here next week.  I offered for her to see another md.  She agreed.  I scheduled her to see Dr Derrell Lolling on 3/27 at 10:25

## 2012-09-25 DIAGNOSIS — L0291 Cutaneous abscess, unspecified: Secondary | ICD-10-CM | POA: Diagnosis not present

## 2012-09-25 DIAGNOSIS — Z8719 Personal history of other diseases of the digestive system: Secondary | ICD-10-CM | POA: Diagnosis not present

## 2012-09-25 DIAGNOSIS — F329 Major depressive disorder, single episode, unspecified: Secondary | ICD-10-CM | POA: Diagnosis not present

## 2012-09-25 DIAGNOSIS — T8189XA Other complications of procedures, not elsewhere classified, initial encounter: Secondary | ICD-10-CM | POA: Diagnosis not present

## 2012-09-25 DIAGNOSIS — G894 Chronic pain syndrome: Secondary | ICD-10-CM | POA: Diagnosis not present

## 2012-09-25 DIAGNOSIS — M6281 Muscle weakness (generalized): Secondary | ICD-10-CM | POA: Diagnosis not present

## 2012-09-27 DIAGNOSIS — G894 Chronic pain syndrome: Secondary | ICD-10-CM | POA: Diagnosis not present

## 2012-09-27 DIAGNOSIS — Z933 Colostomy status: Secondary | ICD-10-CM | POA: Diagnosis not present

## 2012-09-27 DIAGNOSIS — F411 Generalized anxiety disorder: Secondary | ICD-10-CM | POA: Diagnosis not present

## 2012-09-27 DIAGNOSIS — Z6841 Body Mass Index (BMI) 40.0 and over, adult: Secondary | ICD-10-CM | POA: Diagnosis not present

## 2012-09-27 DIAGNOSIS — I10 Essential (primary) hypertension: Secondary | ICD-10-CM | POA: Diagnosis not present

## 2012-09-27 DIAGNOSIS — T8189XA Other complications of procedures, not elsewhere classified, initial encounter: Secondary | ICD-10-CM | POA: Diagnosis not present

## 2012-09-27 DIAGNOSIS — Z8719 Personal history of other diseases of the digestive system: Secondary | ICD-10-CM | POA: Diagnosis not present

## 2012-09-27 DIAGNOSIS — E669 Obesity, unspecified: Secondary | ICD-10-CM | POA: Diagnosis not present

## 2012-09-27 DIAGNOSIS — F329 Major depressive disorder, single episode, unspecified: Secondary | ICD-10-CM | POA: Diagnosis not present

## 2012-09-28 ENCOUNTER — Encounter (INDEPENDENT_AMBULATORY_CARE_PROVIDER_SITE_OTHER): Payer: Medicare Other | Admitting: General Surgery

## 2012-09-28 DIAGNOSIS — T8189XA Other complications of procedures, not elsewhere classified, initial encounter: Secondary | ICD-10-CM | POA: Diagnosis not present

## 2012-09-28 DIAGNOSIS — I10 Essential (primary) hypertension: Secondary | ICD-10-CM | POA: Diagnosis not present

## 2012-09-28 DIAGNOSIS — F411 Generalized anxiety disorder: Secondary | ICD-10-CM | POA: Diagnosis not present

## 2012-09-28 DIAGNOSIS — E669 Obesity, unspecified: Secondary | ICD-10-CM | POA: Diagnosis not present

## 2012-09-28 DIAGNOSIS — F329 Major depressive disorder, single episode, unspecified: Secondary | ICD-10-CM | POA: Diagnosis not present

## 2012-09-28 DIAGNOSIS — G894 Chronic pain syndrome: Secondary | ICD-10-CM | POA: Diagnosis not present

## 2012-09-30 ENCOUNTER — Encounter (INDEPENDENT_AMBULATORY_CARE_PROVIDER_SITE_OTHER): Payer: Self-pay | Admitting: General Surgery

## 2012-09-30 ENCOUNTER — Ambulatory Visit (INDEPENDENT_AMBULATORY_CARE_PROVIDER_SITE_OTHER): Payer: Medicare Other | Admitting: General Surgery

## 2012-09-30 VITALS — BP 134/74 | HR 76 | Temp 97.6°F | Ht 63.0 in | Wt 292.4 lb

## 2012-09-30 DIAGNOSIS — T8130XA Disruption of wound, unspecified, initial encounter: Secondary | ICD-10-CM

## 2012-09-30 NOTE — Progress Notes (Signed)
Patient ID: Jasmine Huff, female   DOB: Dec 03, 1955, 57 y.o.   MRN: [id number] The patient is a 57 year old female status post colostomy with midline wound of Dr. Maisie Fus'.  Patient has been seen in clinic in followup secondary to her midline wound. The patient's tissues continue to dress the wound daily with gauze. No fevers at home. She states her ostomy is working fine.   On exam: Her midline wound is clean dry and intact there is some bloody drainage, there is no purulent drainage, the edges of the WOUND ARE BEEFY RED  Assessment and plan: Patient to follow up in 2 weeks Continue with dressing changes

## 2012-10-01 ENCOUNTER — Encounter (INDEPENDENT_AMBULATORY_CARE_PROVIDER_SITE_OTHER): Payer: Medicare Other | Admitting: General Surgery

## 2012-10-01 DIAGNOSIS — G894 Chronic pain syndrome: Secondary | ICD-10-CM | POA: Diagnosis not present

## 2012-10-01 DIAGNOSIS — E669 Obesity, unspecified: Secondary | ICD-10-CM | POA: Diagnosis not present

## 2012-10-01 DIAGNOSIS — G4733 Obstructive sleep apnea (adult) (pediatric): Secondary | ICD-10-CM | POA: Diagnosis not present

## 2012-10-01 DIAGNOSIS — F411 Generalized anxiety disorder: Secondary | ICD-10-CM | POA: Diagnosis not present

## 2012-10-01 DIAGNOSIS — I10 Essential (primary) hypertension: Secondary | ICD-10-CM | POA: Diagnosis not present

## 2012-10-01 DIAGNOSIS — T8189XA Other complications of procedures, not elsewhere classified, initial encounter: Secondary | ICD-10-CM | POA: Diagnosis not present

## 2012-10-01 DIAGNOSIS — F329 Major depressive disorder, single episode, unspecified: Secondary | ICD-10-CM | POA: Diagnosis not present

## 2012-10-05 DIAGNOSIS — I10 Essential (primary) hypertension: Secondary | ICD-10-CM | POA: Diagnosis not present

## 2012-10-05 DIAGNOSIS — T8189XA Other complications of procedures, not elsewhere classified, initial encounter: Secondary | ICD-10-CM | POA: Diagnosis not present

## 2012-10-05 DIAGNOSIS — E669 Obesity, unspecified: Secondary | ICD-10-CM | POA: Diagnosis not present

## 2012-10-05 DIAGNOSIS — G894 Chronic pain syndrome: Secondary | ICD-10-CM | POA: Diagnosis not present

## 2012-10-05 DIAGNOSIS — F329 Major depressive disorder, single episode, unspecified: Secondary | ICD-10-CM | POA: Diagnosis not present

## 2012-10-05 DIAGNOSIS — F411 Generalized anxiety disorder: Secondary | ICD-10-CM | POA: Diagnosis not present

## 2012-10-07 DIAGNOSIS — I10 Essential (primary) hypertension: Secondary | ICD-10-CM | POA: Diagnosis not present

## 2012-10-07 DIAGNOSIS — E669 Obesity, unspecified: Secondary | ICD-10-CM | POA: Diagnosis not present

## 2012-10-07 DIAGNOSIS — T8189XA Other complications of procedures, not elsewhere classified, initial encounter: Secondary | ICD-10-CM | POA: Diagnosis not present

## 2012-10-07 DIAGNOSIS — G894 Chronic pain syndrome: Secondary | ICD-10-CM | POA: Diagnosis not present

## 2012-10-07 DIAGNOSIS — F411 Generalized anxiety disorder: Secondary | ICD-10-CM | POA: Diagnosis not present

## 2012-10-07 DIAGNOSIS — F329 Major depressive disorder, single episode, unspecified: Secondary | ICD-10-CM | POA: Diagnosis not present

## 2012-10-12 ENCOUNTER — Telehealth (INDEPENDENT_AMBULATORY_CARE_PROVIDER_SITE_OTHER): Payer: Self-pay

## 2012-10-12 DIAGNOSIS — T8189XA Other complications of procedures, not elsewhere classified, initial encounter: Secondary | ICD-10-CM | POA: Diagnosis not present

## 2012-10-12 DIAGNOSIS — E669 Obesity, unspecified: Secondary | ICD-10-CM | POA: Diagnosis not present

## 2012-10-12 DIAGNOSIS — G894 Chronic pain syndrome: Secondary | ICD-10-CM | POA: Diagnosis not present

## 2012-10-12 DIAGNOSIS — I10 Essential (primary) hypertension: Secondary | ICD-10-CM | POA: Diagnosis not present

## 2012-10-12 DIAGNOSIS — F411 Generalized anxiety disorder: Secondary | ICD-10-CM | POA: Diagnosis not present

## 2012-10-12 DIAGNOSIS — F329 Major depressive disorder, single episode, unspecified: Secondary | ICD-10-CM | POA: Diagnosis not present

## 2012-10-12 NOTE — Telephone Encounter (Signed)
I left a message for the pt letting her know what times I have available.  I await her call back

## 2012-10-12 NOTE — Telephone Encounter (Signed)
Message copied by Ivory Broad on Tue Oct 12, 2012  4:23 PM ------      Message from: [facility]      Created: Mon Oct 11, 2012  4:10 PM       SHE NEEDS TO BE SEEN BEFORE THE 30TH AND THAT'S THE FIRST I SEE PLEASE CALL HER ------

## 2012-10-13 ENCOUNTER — Encounter (INDEPENDENT_AMBULATORY_CARE_PROVIDER_SITE_OTHER): Payer: Medicare Other | Admitting: General Surgery

## 2012-10-14 ENCOUNTER — Encounter (INDEPENDENT_AMBULATORY_CARE_PROVIDER_SITE_OTHER): Payer: Self-pay | Admitting: General Surgery

## 2012-10-14 ENCOUNTER — Telehealth (INDEPENDENT_AMBULATORY_CARE_PROVIDER_SITE_OTHER): Payer: Self-pay | Admitting: *Deleted

## 2012-10-14 ENCOUNTER — Ambulatory Visit (INDEPENDENT_AMBULATORY_CARE_PROVIDER_SITE_OTHER): Payer: Medicare Other | Admitting: General Surgery

## 2012-10-14 VITALS — BP 138/84 | HR 68 | Temp 98.0°F | Resp 20 | Ht 63.0 in | Wt 295.0 lb

## 2012-10-14 DIAGNOSIS — G894 Chronic pain syndrome: Secondary | ICD-10-CM | POA: Diagnosis not present

## 2012-10-14 DIAGNOSIS — T8189XD Other complications of procedures, not elsewhere classified, subsequent encounter: Secondary | ICD-10-CM

## 2012-10-14 DIAGNOSIS — I10 Essential (primary) hypertension: Secondary | ICD-10-CM | POA: Diagnosis not present

## 2012-10-14 DIAGNOSIS — Z5189 Encounter for other specified aftercare: Secondary | ICD-10-CM | POA: Diagnosis not present

## 2012-10-14 DIAGNOSIS — E669 Obesity, unspecified: Secondary | ICD-10-CM | POA: Diagnosis not present

## 2012-10-14 DIAGNOSIS — F329 Major depressive disorder, single episode, unspecified: Secondary | ICD-10-CM | POA: Diagnosis not present

## 2012-10-14 DIAGNOSIS — F411 Generalized anxiety disorder: Secondary | ICD-10-CM | POA: Diagnosis not present

## 2012-10-14 DIAGNOSIS — T8189XA Other complications of procedures, not elsewhere classified, initial encounter: Secondary | ICD-10-CM | POA: Diagnosis not present

## 2012-10-14 NOTE — Telephone Encounter (Signed)
I called [name] back.  I told her Dr Maisie Fus thinks the wound looks better.  It is measuring a couple cm deep.  She wants to continue packing daily with nugauze or iodoform.  I told her about the patient's follow up.

## 2012-10-14 NOTE — Progress Notes (Signed)
Jasmine Huff is a 57 y.o. female who is here for a follow up visit regarding her chronic post surgical wound.  She is still having quite a bit of drainage.    Objective: Filed Vitals:   10/14/12 1635  BP: 138/84  Pulse: 68  Temp: 98 F (36.7 C)  Resp: 20    General appearance: alert and cooperative GI: normal findings: soft, non-tender Wound: depth of ~2cm, good granulation tissue Ostomy: pink, viable, hard stool at opening  Assessment and Plan: Continue wound packing.  Will schedule a colonoscopy for next month.  I have asked her to work on increasing her fiber and water intake to help with her hard stools.    Vanita Panda, MD [facility], [place] [phone]

## 2012-10-14 NOTE — Patient Instructions (Signed)
Continue packing daily.  We will schedule you for a colonoscopy in 4 weeks.  Work on Designer, multimedia and water intake over the next few weeks.    [facility]  ONE-DAY (1) PRE-OP HOME COLON PREP INSTRUCTIONS: ** MIRALAX / GATORADE PREP **  You must follow the instructions below carefully.  If you have questions or problems, please call and speak to someone in the clinic department at our office:   [phone].     INSTRUCTIONS: 1. Five days prior to your procedure do not eat nuts, popcorn, or fruit with seeds.  Stop all fiber supplements such as Metamucil, Citrucel, etc. 2. Two days before surgery fill the prescription at a pharmacy of your choice and purchase the additional supplies below.         MIRALAX - GATORADE -- DULCOLAX TABS:   Purchase a bottle of MIRALAX  (255 gm bottle)    In addition, purchase four (4) DULCOLAX TABLETS (no prescription required- ask the pharmacist if you can't find them)    Purchase one 64 oz GATORADE.  (Do NOT purchase red Gatorade; any other flavor is acceptable) and place in refrigerator to get cold.  3.   Day Before Surgery:   6 am: take the 4 Dulcolax tablets   You may only have clear liquids (tea, coffee, juice, broth, jello, soft drinks, gummy bears).  You cannot have solid foods, cream, milk or milk products.  Drink at lease 8 ounces of liquids every hour while awake.   Mix the entire bottle of MiraLax and the Gatorade in a large container.    10:00am: Begin drinking the Gatorade mixture until gone (8 oz every 15-30 minutes).      You may suck on a lime wedge or hard candy to "freshen your palate" in between glasses   If you are a diabetic, take your blood sugar reading several time throughout the prep.  Have some juice available to take if your sugar level gets too low   You may feel chilled while taking the prep.  Have some warm tea or broth to help warm up.   Continue clear liquids until midnight or bedtime  3. The day of your  procedure:   Do not eat or drink ANYTHING after midnight before your surgery.     If you take Heart or Blood Pressure medicine, ask the pre-op nurses about these during your preop appointment.   Further pre-operative instructions will be given to you from the hospital.   Expect to be contacted 5-7 days before your surgery.

## 2012-10-14 NOTE — Telephone Encounter (Signed)
Jasmine Huff with Home Health called to state that she has been following this wound but it continues to measure the exact same.  Toni Amend is asking for a different dressing possibly using hydrogel to help get the wound healing.  Patient is coming in to see Dr. Maisie Fus today to have wound assessed.

## 2012-10-19 DIAGNOSIS — E669 Obesity, unspecified: Secondary | ICD-10-CM | POA: Diagnosis not present

## 2012-10-19 DIAGNOSIS — F329 Major depressive disorder, single episode, unspecified: Secondary | ICD-10-CM | POA: Diagnosis not present

## 2012-10-19 DIAGNOSIS — F411 Generalized anxiety disorder: Secondary | ICD-10-CM | POA: Diagnosis not present

## 2012-10-19 DIAGNOSIS — T8189XA Other complications of procedures, not elsewhere classified, initial encounter: Secondary | ICD-10-CM | POA: Diagnosis not present

## 2012-10-19 DIAGNOSIS — I10 Essential (primary) hypertension: Secondary | ICD-10-CM | POA: Diagnosis not present

## 2012-10-19 DIAGNOSIS — G894 Chronic pain syndrome: Secondary | ICD-10-CM | POA: Diagnosis not present

## 2012-10-22 DIAGNOSIS — F411 Generalized anxiety disorder: Secondary | ICD-10-CM | POA: Diagnosis not present

## 2012-10-22 DIAGNOSIS — G894 Chronic pain syndrome: Secondary | ICD-10-CM | POA: Diagnosis not present

## 2012-10-22 DIAGNOSIS — T8189XA Other complications of procedures, not elsewhere classified, initial encounter: Secondary | ICD-10-CM | POA: Diagnosis not present

## 2012-10-22 DIAGNOSIS — I10 Essential (primary) hypertension: Secondary | ICD-10-CM | POA: Diagnosis not present

## 2012-10-22 DIAGNOSIS — F329 Major depressive disorder, single episode, unspecified: Secondary | ICD-10-CM | POA: Diagnosis not present

## 2012-10-22 DIAGNOSIS — E669 Obesity, unspecified: Secondary | ICD-10-CM | POA: Diagnosis not present

## 2012-10-25 ENCOUNTER — Encounter (HOSPITAL_COMMUNITY): Payer: Self-pay

## 2012-10-26 ENCOUNTER — Ambulatory Visit (INDEPENDENT_AMBULATORY_CARE_PROVIDER_SITE_OTHER): Payer: Medicare Other | Admitting: Licensed Clinical Social Worker

## 2012-10-26 ENCOUNTER — Encounter (HOSPITAL_COMMUNITY): Payer: Self-pay | Admitting: Licensed Clinical Social Worker

## 2012-10-26 DIAGNOSIS — F332 Major depressive disorder, recurrent severe without psychotic features: Secondary | ICD-10-CM
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
## 2012-10-27 ENCOUNTER — Telehealth (INDEPENDENT_AMBULATORY_CARE_PROVIDER_SITE_OTHER): Payer: Self-pay | Admitting: *Deleted

## 2012-10-27 NOTE — Telephone Encounter (Signed)
Patient called to state she is noticing blood x 2 episodes. Patient denies any other symptoms at this time.  Thomas MD made aware and instructed that patient needs to just continue to watch the bleeding and report if it continues.  Patient made aware and agreeable at this time.

## 2012-10-28 ENCOUNTER — Telehealth (INDEPENDENT_AMBULATORY_CARE_PROVIDER_SITE_OTHER): Payer: Self-pay

## 2012-10-28 NOTE — Telephone Encounter (Signed)
Message copied by Ivory Broad on Thu Oct 28, 2012  9:58 AM ------      Message from: Isaias Sakai K      Created: Thu Oct 28, 2012  9:33 AM      Regarding: Dr Maisie Fus      Contact: [phone]       Want to know if she should have a rx for sx. Says her instruction state she is suppose to take rx with gatarade prior to sx ------

## 2012-10-28 NOTE — Telephone Encounter (Signed)
I called the pt because she left a message that she is unable to pack her wound now.  There is some bleeding.  She asked what to do.  I said keep it clean and just cover it with dry gauze.  I told her to call with increased pain and or pus drainage that may alert [place] to infection down in the wound.

## 2012-10-28 NOTE — Telephone Encounter (Signed)
I called and left her a message that there is no rx she needs.  She only has to get Miralax, Gatorade and Dulcolax

## 2012-10-31 ENCOUNTER — Encounter (HOSPITAL_COMMUNITY): Payer: Self-pay | Admitting: Emergency Medicine

## 2012-10-31 ENCOUNTER — Emergency Department (HOSPITAL_COMMUNITY)
Admission: EM | Admit: 2012-10-31 | Discharge: 2012-10-31 | Disposition: A | Discharge status: Home / Self Care | Payer: Medicare Other | Attending: Emergency Medicine | Admitting: Emergency Medicine

## 2012-10-31 DIAGNOSIS — F329 Major depressive disorder, single episode, unspecified: Secondary | ICD-10-CM | POA: Diagnosis not present

## 2012-10-31 DIAGNOSIS — E119 Type 2 diabetes mellitus without complications: Secondary | ICD-10-CM | POA: Diagnosis not present

## 2012-10-31 DIAGNOSIS — F410 Panic disorder [episodic paroxysmal anxiety] without agoraphobia: Secondary | ICD-10-CM | POA: Insufficient documentation

## 2012-10-31 DIAGNOSIS — K625 Hemorrhage of anus and rectum: Secondary | ICD-10-CM | POA: Insufficient documentation

## 2012-10-31 DIAGNOSIS — I10 Essential (primary) hypertension: Secondary | ICD-10-CM | POA: Insufficient documentation

## 2012-10-31 DIAGNOSIS — Z8669 Personal history of other diseases of the nervous system and sense organs: Secondary | ICD-10-CM | POA: Diagnosis not present

## 2012-10-31 DIAGNOSIS — Z79899 Other long term (current) drug therapy: Secondary | ICD-10-CM | POA: Insufficient documentation

## 2012-10-31 DIAGNOSIS — M129 Arthropathy, unspecified: Secondary | ICD-10-CM | POA: Insufficient documentation

## 2012-10-31 DIAGNOSIS — Z8739 Personal history of other diseases of the musculoskeletal system and connective tissue: Secondary | ICD-10-CM | POA: Diagnosis not present

## 2012-10-31 DIAGNOSIS — Z8719 Personal history of other diseases of the digestive system: Secondary | ICD-10-CM | POA: Diagnosis not present

## 2012-10-31 DIAGNOSIS — Z87440 Personal history of urinary (tract) infections: Secondary | ICD-10-CM | POA: Diagnosis not present

## 2012-10-31 DIAGNOSIS — IMO0001 Reserved for inherently not codable concepts without codable children: Secondary | ICD-10-CM | POA: Insufficient documentation

## 2012-10-31 DIAGNOSIS — F3289 Other specified depressive episodes: Secondary | ICD-10-CM | POA: Insufficient documentation

## 2012-10-31 DIAGNOSIS — K219 Gastro-esophageal reflux disease without esophagitis: Secondary | ICD-10-CM | POA: Insufficient documentation

## 2012-10-31 DIAGNOSIS — E669 Obesity, unspecified: Secondary | ICD-10-CM | POA: Insufficient documentation

## 2012-10-31 DIAGNOSIS — K921 Melena: Secondary | ICD-10-CM | POA: Diagnosis not present

## 2012-10-31 DIAGNOSIS — G43909 Migraine, unspecified, not intractable, without status migrainosus: Secondary | ICD-10-CM | POA: Diagnosis not present

## 2012-10-31 DIAGNOSIS — F411 Generalized anxiety disorder: Secondary | ICD-10-CM | POA: Diagnosis not present

## 2012-10-31 DIAGNOSIS — Z7982 Long term (current) use of aspirin: Secondary | ICD-10-CM | POA: Insufficient documentation

## 2012-10-31 NOTE — ED Notes (Signed)
Pt states she is having bright red bleeding from rectum for several days. Spoke w/ surgeon who instructed pt to come to ED if this continued. Pt states has seen small amts of blood in toilet each time she urinates. Crying and begging not to be put in the lobby because this has "turned me into a hot mess"

## 2012-10-31 NOTE — ED Notes (Signed)
Pt. States she has been having bright red blood passing rectally.  She finds this disturbing because she has had a colostomy since 06/08/2023. Of 12/08/2022; and has passed only "grey" mucous from her blind pouch until today.  When I spoke to her she had already been examined by Dr. Effie Shy and he entered her room to inform her he felt that she was ok to go home and proceed with plan for colonoscopy by Dr. Maisie Fus May 8.

## 2012-10-31 NOTE — ED Notes (Addendum)
Per EMS - Pt called d/t small amt of bloody drainage reported to have empties into the toilet. Pt crying and visibly upset upon EMS arrival, states this has happened before. Pt is most upset d/t she is scheduled for colostomy take-down in a couple of weeks. BP - 134/72, HR - 82, O2 SAT 99% RA,

## 2012-10-31 NOTE — ED Notes (Signed)
[id number]<JY> Expected date:<BR> Expected time:<BR> Means of arrival:<BR> Comments:<BR> Triage

## 2012-11-01 DIAGNOSIS — Z5181 Encounter for therapeutic drug level monitoring: Secondary | ICD-10-CM | POA: Diagnosis not present

## 2012-11-01 DIAGNOSIS — M069 Rheumatoid arthritis, unspecified: Secondary | ICD-10-CM | POA: Diagnosis not present

## 2012-11-01 DIAGNOSIS — M1710 Unilateral primary osteoarthritis, unspecified knee: Secondary | ICD-10-CM | POA: Diagnosis not present

## 2012-11-01 DIAGNOSIS — Z79899 Other long term (current) drug therapy: Secondary | ICD-10-CM | POA: Diagnosis not present

## 2012-11-01 DIAGNOSIS — M545 Low back pain: Secondary | ICD-10-CM | POA: Diagnosis not present

## 2012-11-01 DIAGNOSIS — G8929 Other chronic pain: Secondary | ICD-10-CM | POA: Diagnosis not present

## 2012-11-03 ENCOUNTER — Ambulatory Visit (INDEPENDENT_AMBULATORY_CARE_PROVIDER_SITE_OTHER): Payer: Medicare Other | Admitting: Licensed Clinical Social Worker

## 2012-11-03 DIAGNOSIS — F332 Major depressive disorder, recurrent severe without psychotic features: Secondary | ICD-10-CM | POA: Diagnosis not present

## 2012-11-03 NOTE — ED Provider Notes (Signed)
History     CSN: [id number]  Arrival date & time 10/31/12  1557   First MD Initiated Contact with Patient 10/31/12 1645      Chief Complaint  Patient presents with  . Rectal Bleeding    pt w/ colostomy who reports bright red blood from rectum.    (Consider location/radiation/quality/duration/timing/severity/associated sxs/prior treatment) HPI Comments: Jasmine Huff is a 58 y.o. female who has noticed several episodes of rectal bleeding in the last few days. She usually only passed a small amount of grayish mucous from the, since her colostomy was placed months  ago. She is due to have a colonoscopy, soon to followup on her recent surgery. She denies weakness, dizziness, nausea, or vomiting. She has not had fever, cough, shortness of breath, or chest pain. She is not on anticoagulants. There are no none modifying factors.  Patient is a 57 y.o. female presenting with hematochezia. The history is provided by the patient.  Rectal Bleeding     Past Medical History  Diagnosis Date  . Arthritis   . Depression   . Allergy   . Hypertension   . Migraine   . UTI (lower urinary tract infection)   . Fibromyalgia   . Chronic pain syndrome   . GERD (gastroesophageal reflux disease) 01/21/2011  . Anxiety   . Panic attacks   . High blood pressure     per pt, resolved and no longer on BP meds  . Knee pain   . Urination frequency   . Diabetes mellitus type II     per pt, resolved, last several A1c OFF of meds have been < 6.5.    . Perforation of colon 04/08/2012    Past Surgical History  Procedure Laterality Date  . Cholecystectomy    . Abdominal hysterectomy    . Right ovary removal    . Left hemicolectomy with splenic flexure mobilization and end colostomy  04/04/2012  . Laparotomy  04/04/2012    Procedure: EXPLORATORY LAPAROTOMY;  Surgeon: Romie Levee, MD;  Location: [facility];  Service: General;  Laterality: N/A;  left colectomy  . Colostomy  04/04/2012    Procedure: COLOSTOMY;   Surgeon: Romie Levee, MD;  Location: [facility];  Service: General;  Laterality: N/A;  . Colon surgery  Oct 2013    perf bowel w/ colostomy    Family History  Problem Relation Age of Onset  . Depression Mother   . Depression Father   . Alcohol abuse Brother   . Depression Brother   . Depression Maternal Grandfather   . Depression Other     History  Substance Use Topics  . Smoking status: Never Smoker   . Smokeless tobacco: Never Used  . Alcohol Use: No    OB History   Grav Para Term Preterm Abortions TAB SAB Ect Mult Living                  Review of Systems  Gastrointestinal: Positive for hematochezia.  All other systems reviewed and are negative.    Allergies  Pregabalin; Saphris; Iodinated diagnostic agents; and Seroquel  Home Medications   Current Outpatient Rx  Name  Route  Sig  Dispense  Refill  . aspirin EC 81 MG tablet   Oral   Take 81 mg by mouth daily.         . cetirizine (ZYRTEC) 10 MG tablet   Oral   Take 10 mg by mouth daily.          Marland Kitchen  Ferrous Gluconate (IRON) 240 (27 FE) MG TABS   Oral   Take 1 tablet by mouth 2 (two) times daily.          . Flaxseed, Linseed, (FLAX SEED OIL) 1000 MG CAPS   Oral   Take 1 capsule by mouth daily.         . hydroxychloroquine (PLAQUENIL) 200 MG tablet   Oral   Take 200 mg by mouth 2 (two) times daily.          . Multiple Vitamin (MULTIVITAMIN WITH MINERALS) TABS   Oral   Take 1 tablet by mouth daily.         . ondansetron (ZOFRAN) 4 MG tablet   Oral   Take 4 mg by mouth every 8 (eight) hours as needed. For nausea.         . OPANA ER, CRUSH RESISTANT, 10 MG T12A 12 hr tablet               . oxyCODONE-acetaminophen (PERCOCET/ROXICET) 5-325 MG per tablet   Oral   Take 1-2 tablets by mouth every 4 (four) hours as needed. For pain.   30 tablet   0   . polyethylene glycol (MIRALAX / GLYCOLAX) packet   Oral   Take 17 g by mouth daily.         . temazepam (RESTORIL) 15 MG  capsule   Oral   Take 45 mg by mouth at bedtime.         . traZODone (DESYREL) 100 MG tablet   Oral   Take 200 mg by mouth at bedtime.           BP 174/69  Pulse 74  Temp(Src) 98.3 F (36.8 C) (Oral)  Resp 18  SpO2 94%  Physical Exam  Nursing note and vitals reviewed. Constitutional: She is oriented to person, place, and time. She appears well-developed.  Obese  HENT:  Head: Normocephalic and atraumatic.  Eyes: Conjunctivae and EOM are normal. Pupils are equal, round, and reactive to light.  Neck: Normal range of motion and phonation normal. Neck supple.  Cardiovascular: Normal rate, regular rhythm and intact distal pulses.   Pulmonary/Chest: Effort normal and breath sounds normal. She exhibits no tenderness.  Abdominal: Soft. She exhibits no distension. There is no tenderness. There is no guarding.  Genitourinary:  Rectal examination- normal anal tone. Small amount of grayish mucous in the right. No blood in rectum. Hemocult testing negative. No external hemorrhoids or anal fissure.  Musculoskeletal: Normal range of motion.  Neurological: She is alert and oriented to person, place, and time. She has normal strength. She exhibits normal muscle tone.  Skin: Skin is warm and dry.  Psychiatric: She has a normal mood and affect. Her behavior is normal. Judgment and thought content normal.    ED Course  Procedures (including critical care time)  Labs Reviewed  OCCULT BLOOD, POC DEVICE      1. Rectal bleeding       MDM  Suspected rectal bleeding, not found on evaluation. She is hemodynamically stable. She has followup scheduled with her surgeon, for colonoscopy. There is no indication for additional evaluation or treatment at this time.  Nursing Notes Reviewed/ Care Coordinated, and agree without changes. Applicable Imaging Reviewed.  Interpretation of Laboratory Data incorporated into ED treatment   Plan: Home Medications- usual; Home Treatments- rest;  Recommended follow up- and scheduled          Flint Melter, MD 11/03/12 0116

## 2012-11-03 NOTE — Progress Notes (Signed)
   THERAPIST PROGRESS NOTE  Session Time: 1:00pm-1:50pm  Participation Level: Active  Behavioral Response: Well GroomedAlertAnxious and Depressed  Type of Therapy: Individual Therapy  Treatment Goals addressed: Anxiety and Coping  Interventions: CBT, Motivational Interviewing, Solution Focused, Strength-based, Assertiveness Training, Supportive and Reframing  Summary: Jasmine Huff is a 57 y.o. female who presents with anxious mood and affect. She reports increased frustration with her anxiety which she is unable to control. She does not know why she is anxious but feels as though her insides are turning around inside her. She reports that her coping strategies don't work and that she remains anxious. She thinks that her anxiety is related to an increase in Lexapro and she will see Dr. Evelene Croon this Saturday and will discuss this with her then. She did follow up on a goal set last session and set firm boundaries with her brother. She is pleased with her ability to do this and plans to continue these boundaries. She is trying to increase her socialization. She reports a dramatic decrease in her appetite and doesn't know why. She is trying to incorporate more protein in her diet.    Suicidal/Homicidal: Nowithout intent/plan  Therapist Response: Assessed patients current functioning and reviewed progress. Reviewed coping strategies. Assessed patients safety and assisted in identifying protective factors.  Reviewed crisis plan with patient. Assisted patient with the expression of anxiety. Reviewed patients self care plan. Assessed progress related to self care. Patients self care is fair and improving. Recommend daily exercise, increased socialization and recreation. Used CBT to assist patient with the identification of negative distortions and irrational thoughts. Encouraged patient to verbalize alternative and factual responses which challenge thought distortions. Used DBT to practice mindfulness,  review distraction list and improve distress tolerance skills. Reviewed healthy boundaries and assertive communication. Used motivational interviewing to assist and encourage patient through the change process. Explored patients barriers to change.   Plan: Return again in two weeks.  Diagnosis: Axis I: Major Depression, Recurrent severe    Axis II: No diagnosis    Jasmine Delauder, LCSW 11/03/2012

## 2012-11-09 DIAGNOSIS — M25569 Pain in unspecified knee: Secondary | ICD-10-CM | POA: Diagnosis not present

## 2012-11-09 DIAGNOSIS — M25549 Pain in joints of unspecified hand: Secondary | ICD-10-CM | POA: Diagnosis not present

## 2012-11-09 DIAGNOSIS — M069 Rheumatoid arthritis, unspecified: Secondary | ICD-10-CM | POA: Diagnosis not present

## 2012-11-09 DIAGNOSIS — Z79899 Other long term (current) drug therapy: Secondary | ICD-10-CM | POA: Diagnosis not present

## 2012-11-09 DIAGNOSIS — M25519 Pain in unspecified shoulder: Secondary | ICD-10-CM | POA: Diagnosis not present

## 2012-11-11 ENCOUNTER — Telehealth (INDEPENDENT_AMBULATORY_CARE_PROVIDER_SITE_OTHER): Payer: Self-pay

## 2012-11-11 NOTE — Telephone Encounter (Signed)
The pt called me and clarified if she can take her routine medicines today.  She is doing her bowel prep for her colonoscopy.  I told her she can.  I made sure she isn't on her Aspirin and she isn't.

## 2012-11-12 ENCOUNTER — Encounter (HOSPITAL_COMMUNITY): Payer: Self-pay | Admitting: *Deleted

## 2012-11-12 ENCOUNTER — Ambulatory Visit (HOSPITAL_COMMUNITY)
Admission: RE | Admit: 2012-11-12 | Discharge: 2012-11-12 | Disposition: A | Discharge status: Home / Self Care | Payer: Medicare Other | Source: Ambulatory Visit | Attending: General Surgery | Admitting: General Surgery

## 2012-11-12 ENCOUNTER — Encounter (HOSPITAL_COMMUNITY)
Admission: RE | Disposition: A | Discharge status: Home / Self Care | Payer: Self-pay | Source: Ambulatory Visit | Attending: General Surgery

## 2012-11-12 DIAGNOSIS — K5730 Diverticulosis of large intestine without perforation or abscess without bleeding: Secondary | ICD-10-CM | POA: Insufficient documentation

## 2012-11-12 DIAGNOSIS — I10 Essential (primary) hypertension: Secondary | ICD-10-CM | POA: Diagnosis not present

## 2012-11-12 DIAGNOSIS — K219 Gastro-esophageal reflux disease without esophagitis: Secondary | ICD-10-CM | POA: Diagnosis not present

## 2012-11-12 DIAGNOSIS — E119 Type 2 diabetes mellitus without complications: Secondary | ICD-10-CM | POA: Insufficient documentation

## 2012-11-12 DIAGNOSIS — Z79899 Other long term (current) drug therapy: Secondary | ICD-10-CM | POA: Insufficient documentation

## 2012-11-12 DIAGNOSIS — Z1211 Encounter for screening for malignant neoplasm of colon: Secondary | ICD-10-CM | POA: Diagnosis not present

## 2012-11-12 DIAGNOSIS — G4730 Sleep apnea, unspecified: Secondary | ICD-10-CM | POA: Insufficient documentation

## 2012-11-12 DIAGNOSIS — D126 Benign neoplasm of colon, unspecified: Secondary | ICD-10-CM | POA: Insufficient documentation

## 2012-11-12 HISTORY — PX: COLONOSCOPY: SHX5424

## 2012-11-12 HISTORY — DX: Anemia, unspecified: D64.9

## 2012-11-12 HISTORY — DX: Sleep apnea, unspecified: G47.30

## 2012-11-12 SURGERY — COLONOSCOPY
Anesthesia: Moderate Sedation

## 2012-11-12 MED ORDER — DIPHENHYDRAMINE HCL 50 MG/ML IJ SOLN
INTRAMUSCULAR | Status: DC | PRN
  Administered 2012-11-12: 25 mg via INTRAVENOUS

## 2012-11-12 MED ORDER — FENTANYL CITRATE 0.05 MG/ML IJ SOLN
INTRAMUSCULAR | Status: AC
  Filled 2012-11-12: qty 4

## 2012-11-12 MED ORDER — FENTANYL CITRATE 0.05 MG/ML IJ SOLN
INTRAMUSCULAR | Status: DC | PRN
  Administered 2012-11-12 (×4): 25 ug via INTRAVENOUS

## 2012-11-12 MED ORDER — DIPHENHYDRAMINE HCL 50 MG/ML IJ SOLN
INTRAMUSCULAR | Status: AC
  Filled 2012-11-12: qty 1

## 2012-11-12 MED ORDER — MIDAZOLAM HCL 10 MG/2ML IJ SOLN
INTRAMUSCULAR | Status: AC
  Filled 2012-11-12: qty 6

## 2012-11-12 MED ORDER — MIDAZOLAM HCL 10 MG/2ML IJ SOLN
INTRAMUSCULAR | Status: DC | PRN
  Administered 2012-11-12 (×4): 2.5 mg via INTRAVENOUS

## 2012-11-12 MED ORDER — SODIUM CHLORIDE 0.9 % IV SOLN
INTRAVENOUS | Status: DC
  Administered 2012-11-12: 500 mL via INTRAVENOUS

## 2012-11-12 NOTE — H&P (Signed)
Jasmine Huff is an 57 y.o. female.   HPI: She is here today for evaluation of her colon after an emergent sigmoidectomy last year due to perforation.    Past Medical History  Diagnosis Date  . Arthritis   . Depression   . Allergy   . Hypertension   . Migraine   . UTI (lower urinary tract infection)   . Fibromyalgia   . Chronic pain syndrome   . GERD (gastroesophageal reflux disease) 01/21/2011  . Anxiety   . Panic attacks   . High blood pressure     per pt, resolved and no longer on BP meds  . Knee pain   . Urination frequency   . Diabetes mellitus type II     per pt, resolved, last several A1c OFF of meds have been < 6.5.    . Perforation of colon 04/08/2012  . Sleep apnea     CPAP  . Anemia     Past Surgical History  Procedure Laterality Date  . Cholecystectomy    . Abdominal hysterectomy    . Right ovary removal    . Left hemicolectomy with splenic flexure mobilization and end colostomy  04/04/2012  . Laparotomy  04/04/2012    Procedure: EXPLORATORY LAPAROTOMY;  Surgeon: Romie Levee, MD;  Location: [facility];  Service: General;  Laterality: N/A;  left colectomy  . Colostomy  04/04/2012    Procedure: COLOSTOMY;  Surgeon: Romie Levee, MD;  Location: [facility];  Service: General;  Laterality: N/A;  . Colon surgery  Oct 2013    perf bowel w/ colostomy    Family History  Problem Relation Age of Onset  . Depression Mother   . Depression Father   . Alcohol abuse Brother   . Depression Brother   . Depression Maternal Grandfather   . Depression Other    Social History:  reports that she has never smoked. She has never used smokeless tobacco. She reports that she does not drink alcohol or use illicit drugs.  Allergies:  Allergies  Allergen Reactions  . Pregabalin Rash    Alters Mental status, flushing  . Saphris (Asenapine Maleate) Shortness Of Breath and Other (See Comments)    Lips tingle and tongue becomes numb/Akasthesia  . Iodinated Diagnostic Agents     Dye used  for CT scans, severe hives  . Seroquel (Quetiapine Fumarate)     Medications Prior to Admission  Medication Sig Dispense Refill  . aspirin EC 81 MG tablet Take 81 mg by mouth daily.      . cetirizine (ZYRTEC) 10 MG tablet Take 10 mg by mouth daily.       . Ferrous Gluconate (IRON) 240 (27 FE) MG TABS Take 1 tablet by mouth 2 (two) times daily.       . Flaxseed, Linseed, (FLAX SEED OIL) 1000 MG CAPS Take 1 capsule by mouth daily.      . hydroxychloroquine (PLAQUENIL) 200 MG tablet Take 200 mg by mouth 2 (two) times daily.       . Multiple Vitamin (MULTIVITAMIN WITH MINERALS) TABS Take 1 tablet by mouth daily.      . ondansetron (ZOFRAN) 4 MG tablet Take 4 mg by mouth every 8 (eight) hours as needed. For nausea.      . OPANA ER, CRUSH RESISTANT, 10 MG T12A 12 hr tablet       . oxyCODONE-acetaminophen (PERCOCET/ROXICET) 5-325 MG per tablet Take 1-2 tablets by mouth every 4 (four) hours as needed. For pain.  30  tablet  0  . polyethylene glycol (MIRALAX / GLYCOLAX) packet Take 17 g by mouth daily.      . temazepam (RESTORIL) 15 MG capsule Take 45 mg by mouth at bedtime.      . traZODone (DESYREL) 100 MG tablet Take 200 mg by mouth at bedtime.        No results found for this or any previous visit (from the past 48 hour(s)). No results found.  Review of Systems  Constitutional: Negative for fever and chills.  Respiratory: Negative for cough, sputum production and shortness of breath.   Cardiovascular: Negative for chest pain and palpitations.  Gastrointestinal: Negative for heartburn, nausea, vomiting, abdominal pain and diarrhea.  Genitourinary: Negative for dysuria.  Skin: Negative for rash.    Blood pressure 167/101, temperature 98.2 F (36.8 C), temperature source Oral, resp. rate 23, height 5\' 3"  (1.6 m), weight 290 lb (131.543 kg), SpO2 97.00%. Physical Exam  Constitutional: She is oriented to person, place, and time. She appears well-developed and well-nourished. No distress.   HENT:  Head: Normocephalic and atraumatic.  Eyes: Conjunctivae are normal. Pupils are equal, round, and reactive to light.  Neck: Normal range of motion.  Cardiovascular: Normal rate and regular rhythm.   Respiratory: Effort normal and breath sounds normal. No respiratory distress.  GI: Soft. Bowel sounds are normal.  Neurological: She is alert and oriented to person, place, and time.  Skin: Skin is warm and dry.     Assessment/Plan We will proceed with colonoscopy.  Risks and benefits were explained and consent obtained.   Staci Carver C. 11/12/2012, 7:13 AM

## 2012-11-12 NOTE — Op Note (Signed)
[facility] [address] [place], [address]   COLONOSCOPY PROCEDURE REPORT  PATIENT: Jasmine, Huff  MR#: [id number] BIRTHDATE: 18-Nov-1955 , 56  yrs. old GENDER: Female ENDOSCOPIST: Vanita Panda, MD REFERRED BY: PROCEDURE DATE:  11/12/2012 PROCEDURE:   Colonoscopy, screening ASA CLASS:   Class III INDICATIONS:Colorectal cancer screening and Average risk patient for colon cancer. MEDICATIONS: Fentanyl 125 mcg IV, Benadryl 25, Versed, and Fentanyl-Detailed 12.5  DESCRIPTION OF PROCEDURE:   After the risks benefits and alternatives of the procedure were thoroughly explained, informed consent was obtained.  A digital rectal exam revealed no abnormalities of the rectum.   The [employer] Slim Colonicsope [id number])  endoscope was introduced through the anus and advanced to the cecum, which was identified by both the appendix and ileocecal valve. No adverse events experienced.   The quality of the prep was good, using MiraLax  The instrument was then slowly withdrawn as the colon was fully examined.    Findings:   COLON FINDINGS: A smooth flat polyp was found 3cm from the anal verge and in the transverse colon.  A polypectomy was performed with cold forceps.  The resection was complete and the polyp tissue was completely retrieved.  Retroflexion was not performed. Withdrawal time was 16 minutes.  The scope was withdrawn and the procedure completed.  ENDOSCOPIC IMPRESSION:     1.   Flat polyp was found 3cm from the anal verge and in the transverse colon; polypectomy was performed with cold forceps 2.   Diverticulosis   RECOMMENDATIONS:     Repeat colonoscopy in 5 years if polyp adenomatous; otherwise 10 years   eSigned:  Vanita Panda, MD 11/12/2012 10:55 AM   cc: Mila Palmer, MD

## 2012-11-15 ENCOUNTER — Telehealth (INDEPENDENT_AMBULATORY_CARE_PROVIDER_SITE_OTHER): Payer: Self-pay | Admitting: General Surgery

## 2012-11-15 ENCOUNTER — Telehealth (INDEPENDENT_AMBULATORY_CARE_PROVIDER_SITE_OTHER): Payer: Self-pay | Admitting: *Deleted

## 2012-11-15 ENCOUNTER — Encounter (HOSPITAL_COMMUNITY): Payer: Self-pay | Admitting: General Surgery

## 2012-11-15 NOTE — Telephone Encounter (Signed)
Patient called again to state [facility] will be calling for prescription for ostomy products.

## 2012-11-15 NOTE — Telephone Encounter (Signed)
Patient calling for prescription for ostomy bags. She uses [facility]. Please advise. [phone].

## 2012-11-16 NOTE — Telephone Encounter (Signed)
rec'd fax today from ostomy supply company and we will get it signed and faxed.

## 2012-11-19 ENCOUNTER — Ambulatory Visit (HOSPITAL_COMMUNITY): Payer: Self-pay | Admitting: Licensed Clinical Social Worker

## 2012-11-25 ENCOUNTER — Ambulatory Visit (INDEPENDENT_AMBULATORY_CARE_PROVIDER_SITE_OTHER): Payer: Medicare Other | Admitting: General Surgery

## 2012-11-25 ENCOUNTER — Encounter (INDEPENDENT_AMBULATORY_CARE_PROVIDER_SITE_OTHER): Payer: Self-pay | Admitting: General Surgery

## 2012-11-25 VITALS — BP 140/84 | HR 88 | Temp 98.3°F | Resp 18 | Ht 63.0 in | Wt 295.0 lb

## 2012-11-25 DIAGNOSIS — K9419 Other complications of enterostomy: Secondary | ICD-10-CM

## 2012-11-25 DIAGNOSIS — IMO0002 Reserved for concepts with insufficient information to code with codable children: Secondary | ICD-10-CM

## 2012-11-25 NOTE — Patient Instructions (Signed)
[facility]  ONE-DAY (1) PRE-OP HOME COLON PREP INSTRUCTIONS: ** MIRALAX / GATORADE PREP / FLAGYL**  You must follow the instructions below carefully.  If you have questions or problems, please call and speak to someone in the clinic department at our office:   [phone].     INSTRUCTIONS: 1. Five days prior to your procedure do not eat nuts, popcorn, or fruit with seeds.  Stop all fiber supplements such as Metamucil, Citrucel, etc. 2. Two days before surgery fill the prescription at a pharmacy of your choice and purchase the additional supplies below.         MIRALAX - GATORADE -- DULCOLAX TABS -- FLEET ENEMA:   Purchase a bottle of MIRALAX  (255 gm bottle)    In addition, purchase four (4) DULCOLAX TABLETS (no prescription required- ask the pharmacist if you can't find them)   Purchase one Fleet Enema (Green and white box)    Purchase one 64 oz GATORADE.  (Do NOT purchase red Gatorade; any other flavor is acceptable) and place in refrigerator to get cold.  3.   Day Before Surgery:   6 am: Wash you abdomen with soap and repeat this on the morning of surgery and take 4 Dulcolax tablets   You may only have clear liquids (tea, coffee, juice, broth, jello, soft drinks, gummy bears).  You cannot have solid foods, cream, milk or milk products.  Drink at lease 8 ounces of liquids every hour while awake.   Take the Flagyl prescription as directed at 8 am, 2 pm and 8 pm.  It is helpful to take this with some jello instead of on an empty stomach.  Any flavor is ok, except red jello, which will cause red stools.   Mix the entire bottle of MiraLax and the Gatorade in a large container.    10:00am: Begin drinking the Gatorade mixture until gone (8 oz every 15-30 minutes).      You may suck on a lime wedge or hard candy to "freshen your palate" in between glasses   If you are a diabetic, take your blood sugar reading several time throughout the prep.  Have some juice available to take if your  sugar level gets too low   You may feel chilled while taking the prep.  Have some warm tea or broth to help warm up.   Continue clear liquids until midnight or bedtime  3. The day of your procedure:   Do not eat or drink ANYTHING after midnight before your surgery.     If you take Heart or Blood Pressure medicine, ask the pre-op nurses about these during your preop appointment.   Take a regular Fleet Enema one hour before leaving the come to the hospital.  Try to hold it in 5-10 mins before expelling.   Further pre-operative instructions will be given to you from the hospital.   Expect to be contacted 5-7 days before your surgery.     

## 2012-11-25 NOTE — Progress Notes (Signed)
Chief Complaint  Patient presents with  . Routine Post Op    f/u colonoscopy    HISTORY:  Jasmine Huff is a 57 y.o. female who presents to clinic with a diverting ostomy after sigmoid perforation.  She is here today to discuss to discuss reversal of her ostomy.    Past Medical History  Diagnosis Date  . Arthritis   . Depression   . Allergy   . Hypertension   . Migraine   . UTI (lower urinary tract infection)   . Fibromyalgia   . Chronic pain syndrome   . GERD (gastroesophageal reflux disease) 01/21/2011  . Anxiety   . Panic attacks   . High blood pressure     per pt, resolved and no longer on BP meds  . Knee pain   . Urination frequency   . Diabetes mellitus type II     per pt, resolved, last several A1c OFF of meds have been < 6.5.    . Perforation of colon 04/08/2012  . Sleep apnea     CPAP  . Anemia        Past Surgical History  Procedure Laterality Date  . Cholecystectomy    . Abdominal hysterectomy    . Right ovary removal    . Left hemicolectomy with splenic flexure mobilization and end colostomy  04/04/2012  . Laparotomy  04/04/2012    Procedure: EXPLORATORY LAPAROTOMY;  Surgeon: Romie Levee, MD;  Location: [facility];  Service: General;  Laterality: N/A;  left colectomy  . Colostomy  04/04/2012    Procedure: COLOSTOMY;  Surgeon: Romie Levee, MD;  Location: [facility];  Service: General;  Laterality: N/A;  . Colon surgery  Oct 2013    perf bowel w/ colostomy  . Colonoscopy N/A 11/12/2012    Procedure: COLONOSCOPY;  Surgeon: Romie Levee, MD;  Location: [facility];  Service: Endoscopy;  Laterality: N/A;      Current Outpatient Prescriptions  Medication Sig Dispense Refill  . ALPRAZolam (XANAX) 1 MG tablet       . aspirin EC 81 MG tablet Take 81 mg by mouth daily.      . cetirizine (ZYRTEC) 10 MG tablet Take 10 mg by mouth daily.       Marland Kitchen escitalopram (LEXAPRO) 20 MG tablet       . Ferrous Gluconate (IRON) 240 (27 FE) MG TABS Take 1 tablet by mouth 2 (two) times  daily.       . Flaxseed, Linseed, (FLAX SEED OIL) 1000 MG CAPS Take 1 capsule by mouth daily.      . hydroxychloroquine (PLAQUENIL) 200 MG tablet Take 200 mg by mouth 2 (two) times daily.       . Multiple Vitamin (MULTIVITAMIN WITH MINERALS) TABS Take 1 tablet by mouth daily.      Marland Kitchen omeprazole (PRILOSEC) 40 MG capsule       . ondansetron (ZOFRAN) 4 MG tablet Take 4 mg by mouth every 8 (eight) hours as needed. For nausea.      . OPANA ER, CRUSH RESISTANT, 10 MG T12A 12 hr tablet       . oxyCODONE-acetaminophen (PERCOCET/ROXICET) 5-325 MG per tablet Take 1-2 tablets by mouth every 4 (four) hours as needed. For pain.  30 tablet  0  . polyethylene glycol (MIRALAX / GLYCOLAX) packet Take 17 g by mouth daily.      . temazepam (RESTORIL) 15 MG capsule Take 45 mg by mouth at bedtime.      . traZODone (DESYREL) 100  MG tablet Take 200 mg by mouth at bedtime.      . [DISCONTINUED] solifenacin (VESICARE) 10 MG tablet Take 0.5 tablets (5 mg total) by mouth daily.  30 tablet  1  . [DISCONTINUED] tolterodine (DETROL LA) 4 MG 24 hr capsule Take 1 capsule (4 mg total) by mouth daily.  30 capsule  2   No current facility-administered medications for this visit.     Allergies  Allergen Reactions  . Pregabalin Rash    Alters Mental status, flushing  . Saphris (Asenapine Maleate) Shortness Of Breath and Other (See Comments)    Lips tingle and tongue becomes numb/Akasthesia  . Iodinated Diagnostic Agents     Dye used for CT scans, severe hives  . Seroquel (Quetiapine Fumarate)       Family History  Problem Relation Age of Onset  . Depression Mother   . Depression Father   . Alcohol abuse Brother   . Depression Brother   . Depression Maternal Grandfather   . Depression Other       History   Social History  . Marital Status: Divorced    Spouse Name: N/A    Number of Children: 0  . Years of Education: N/A   Occupational History  . disabled     Social History Main Topics  . Smoking status:  Never Smoker   . Smokeless tobacco: Never Used  . Alcohol Use: No  . Drug Use: No  . Sexually Active: No   Other Topics Concern  . None   Social History Narrative  . None       REVIEW OF SYSTEMS - PERTINENT POSITIVES ONLY: Review of Systems - General ROS: negative for - chills or fever Hematological and Lymphatic ROS: negative for - bleeding problems, blood clots or bruising Respiratory ROS: no cough, shortness of breath, or wheezing Cardiovascular ROS: no chest pain or dyspnea on exertion Gastrointestinal ROS: no abdominal pain, change in bowel habits, or black or bloody stools  EXAM: Filed Vitals:   11/25/12 1423  BP: 140/84  Pulse: 88  Temp: 98.3 F (36.8 C)  Resp: 18    General appearance: alert and cooperative Resp: clear to auscultation bilaterally Cardio: regular rate and rhythm GI: normal findings: soft, non-tender and abnormal findings:  infra-umbilical scar, healing surgical wound   LABORATORY RESULTS: Available labs are reviewed  Lab Results  Component Value Date   WBC 8.8 09/15/2012   HGB 10.5* 09/15/2012   HCT 36.4 09/15/2012   MCV 76.2* 09/15/2012   PLT 294 09/15/2012   Lab Results  Component Value Date   HGBA1C 6.2* 04/07/2012    ASSESSMENT AND PLAN: Jasmine Huff is doing well.  She is about 9 months out from sigmoid resection due to perforation.  She is moving better now, and her diabetes is controlled.  We discussed the risks of surgical ostomy reversal.  The surgery and anatomy were described to the patient as well as the risks of surgery and the possible complications.  These include: Bleeding, infection and possible wound complications such as hernia, damage to adjacent structures, leak of surgical connections, which can lead to other surgeries and possibly an ostomy (5-7%), possible need for other procedures, such as abscess drains in radiology, possible prolonged hospital stay, possible diarrhea from removal of part of the colon, possible  constipation from narcotics, prolonged fatigue/weakness or appetite loss, possible complications of their medical problems such as heart disease or arrhythmias or lung problems, death (less than 1%).  I believe  the patient understands and wishes to proceed with the surgery.      Vanita Panda, MD Colon and Rectal Surgery / General Surgery [facility]      Visit Diagnoses: 1. 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

## 2012-12-10 DIAGNOSIS — Z01818 Encounter for other preprocedural examination: Secondary | ICD-10-CM | POA: Diagnosis not present

## 2012-12-10 DIAGNOSIS — E119 Type 2 diabetes mellitus without complications: Secondary | ICD-10-CM | POA: Diagnosis not present

## 2012-12-10 DIAGNOSIS — Z79899 Other long term (current) drug therapy: Secondary | ICD-10-CM | POA: Diagnosis not present

## 2012-12-10 DIAGNOSIS — D509 Iron deficiency anemia, unspecified: Secondary | ICD-10-CM | POA: Diagnosis not present

## 2012-12-14 ENCOUNTER — Encounter (INDEPENDENT_AMBULATORY_CARE_PROVIDER_SITE_OTHER): Payer: Medicare Other | Admitting: General Surgery

## 2012-12-16 ENCOUNTER — Telehealth (INDEPENDENT_AMBULATORY_CARE_PROVIDER_SITE_OTHER): Payer: Self-pay | Admitting: General Surgery

## 2012-12-16 NOTE — Telephone Encounter (Signed)
I notified the patient per Dr Maisie Fus, increase her fluid intake and double the Miralax.  She states she just drank 32 oz of water and feels a little better.  I also told her we got her medical clearance and the orders will be sent to the schedulers.  She wants surgery asap.

## 2012-12-16 NOTE — Telephone Encounter (Signed)
Patient called status post diverting ostomy after sigmoid perforation with complaints of constipation. She states she has felt constipated since she started a new anti-depressant medication. She states she is having abdominal cramping and feeling pressure in her rectum. She has been taking miralax and flax seed oil daily. She states she is emptying her bag every 2-3 days, but this has not changed and her out put from her ostomy has not decreased. She wants Dr Maurine Minister suggestion of what else to try. Please advise. You can reach patient back at [phone].

## 2012-12-17 ENCOUNTER — Encounter (INDEPENDENT_AMBULATORY_CARE_PROVIDER_SITE_OTHER): Payer: Self-pay

## 2012-12-22 ENCOUNTER — Encounter (INDEPENDENT_AMBULATORY_CARE_PROVIDER_SITE_OTHER): Payer: Self-pay

## 2012-12-30 ENCOUNTER — Encounter (INDEPENDENT_AMBULATORY_CARE_PROVIDER_SITE_OTHER): Payer: Self-pay

## 2013-01-05 ENCOUNTER — Telehealth (INDEPENDENT_AMBULATORY_CARE_PROVIDER_SITE_OTHER): Payer: Self-pay | Admitting: General Surgery

## 2013-01-05 NOTE — Telephone Encounter (Signed)
Arlys John from [facility] called to get prescription for ostomy supplies. He states the patient only has enough til the end of this month and is wanting to renew the prescription. I asked him to fax the request to [phone]. He was very adamant that he wanted to speak with Haig Prophet because last time they went over this verbally. I made him aware that the best way to handle this is by fax. Patient is not running out of supplies and has enough to last for July. I again told him to fax the request. Please look out for request and have Dr Maisie Fus sign.

## 2013-01-11 NOTE — Telephone Encounter (Signed)
Arlys John from [facility] called back again asking for order to continue ostomy supplies for patient through the end of July.  This RN spoke to Dr. Maisie Fus who approved any supplies patient needs for ostomy through 02/17/13 which is when patient is scheduled for reversal.  Arlys John to fax over order at this time to have Dr. Maisie Fus sign.

## 2013-01-24 DIAGNOSIS — G4733 Obstructive sleep apnea (adult) (pediatric): Secondary | ICD-10-CM | POA: Diagnosis not present

## 2013-01-25 ENCOUNTER — Other Ambulatory Visit (INDEPENDENT_AMBULATORY_CARE_PROVIDER_SITE_OTHER): Payer: Self-pay | Admitting: General Surgery

## 2013-01-25 ENCOUNTER — Telehealth (INDEPENDENT_AMBULATORY_CARE_PROVIDER_SITE_OTHER): Payer: Self-pay

## 2013-01-25 DIAGNOSIS — Z01818 Encounter for other preprocedural examination: Secondary | ICD-10-CM

## 2013-01-25 MED ORDER — METRONIDAZOLE 500 MG PO TABS: 500.0000 mg | ORAL_TABLET | ORAL | Status: AC

## 2013-01-25 NOTE — Telephone Encounter (Signed)
I called the pt and let her know I am mailing the prep to her.  She will also have to pick up the Flagyl from her pharmacy.

## 2013-01-25 NOTE — Telephone Encounter (Signed)
Pt called requesting a new colon prep instruction and rx if indicated be sent to her. Pt states she has misplaced the form. Pt has colostomy reversal on 02-17-13. Pt states home address will be fine. I advised her since I do not see type of prep in system I will send request to Dr Maisie Fus and her nurse so correct prep can be mailed to pt.

## 2013-02-07 ENCOUNTER — Encounter (HOSPITAL_COMMUNITY): Payer: Self-pay | Admitting: Pharmacy Technician

## 2013-02-09 DIAGNOSIS — M1710 Unilateral primary osteoarthritis, unspecified knee: Secondary | ICD-10-CM | POA: Diagnosis not present

## 2013-02-09 DIAGNOSIS — M069 Rheumatoid arthritis, unspecified: Secondary | ICD-10-CM | POA: Diagnosis not present

## 2013-02-09 DIAGNOSIS — M545 Low back pain: Secondary | ICD-10-CM | POA: Diagnosis not present

## 2013-02-10 ENCOUNTER — Other Ambulatory Visit (HOSPITAL_COMMUNITY): Payer: Self-pay | Admitting: General Surgery

## 2013-02-10 NOTE — Patient Instructions (Addendum)
20 Jasmine Huff  02/10/2013   Your procedure is scheduled on: 02-17-2013  Report to [facility] at 630 AM.  Call this number if you have problems the morning of surgery [phone]   Remember: check with dr Maisie Fus about stopping plaquenil before surgery. Phone number [phone] dr Maisie Fus   Do not eat food or drink liquids :After Midnight.     Take these medicines the morning of surgery with A SIP OF WATER: xanax, wellbutrin, lexapro                                SEE [name] PREPARING FOR SURGERY SHEET   Do not wear jewelry, make-up or nail polish.  Do not wear lotions, powders, or perfumes. You may wear deodorant.   Men may shave face and neck.  Do not bring valuables to the hospital. [name] IS NOT RESPONSIBLE FOR VALUEABLES.  Contacts, dentures or bridgework may not be worn into surgery.  Leave suitcase in the car. After surgery it may be brought to your room.  For patients admitted to the hospital, checkout time is 11:00 AM the day of discharge.   Patients discharged the day of surgery will not be allowed to drive home.  Name and phone number of your driver:  Special Instructions: N/A   Please read over the following fact sheets that you were given: MRSA Information, blood fact sheet  Call Cain Sieve RN pre op nurse if needed [phone]    FAILURE TO FOLLOW THESE INSTRUCTIONS MAY RESULT IN THE CANCELLATION OF YOUR SURGERY.  PATIENT SIGNATURE___________________________________________  NURSE SIGNATURE_____________________________________________

## 2013-02-11 ENCOUNTER — Encounter (HOSPITAL_COMMUNITY): Payer: Self-pay

## 2013-02-11 ENCOUNTER — Encounter (HOSPITAL_COMMUNITY)
Admission: RE | Admit: 2013-02-11 | Discharge: 2013-02-11 | Disposition: A | Discharge status: Home / Self Care | Payer: Medicare Other | Source: Ambulatory Visit | Attending: General Surgery | Admitting: General Surgery

## 2013-02-11 ENCOUNTER — Telehealth (INDEPENDENT_AMBULATORY_CARE_PROVIDER_SITE_OTHER): Payer: Self-pay

## 2013-02-11 DIAGNOSIS — Z01812 Encounter for preprocedural laboratory examination: Secondary | ICD-10-CM | POA: Insufficient documentation

## 2013-02-11 HISTORY — DX: Personal history of other diseases of the digestive system: Z87.19

## 2013-02-11 LAB — BASIC METABOLIC PANEL
Chloride: 100 mEq/L (ref 96–112)
GFR calc Af Amer: 73 mL/min — ABNORMAL LOW (ref >90)
Potassium: 4.8 mEq/L (ref 3.5–5.1)
Sodium: 139 mEq/L (ref 135–145)

## 2013-02-11 LAB — CBC
HCT: 44.6 % (ref 36.0–46.0)
Hemoglobin: 13.8 g/dL (ref 12.0–15.0)
RBC: 5.24 MIL/uL — ABNORMAL HIGH (ref 3.87–5.11)
RDW: 16 % — ABNORMAL HIGH (ref 11.5–15.5)
WBC: 11.3 10*3/uL — ABNORMAL HIGH (ref 4.0–10.5)

## 2013-02-11 NOTE — Telephone Encounter (Signed)
Patient called to report she didn't get her bowel prep instructions in the mail.  She also asked if she needs to stop her Plaquenil.  I mailed her another copy of the prep and per Dr Maisie Fus I told her to stay on the Plaquenil.

## 2013-02-16 ENCOUNTER — Telehealth (INDEPENDENT_AMBULATORY_CARE_PROVIDER_SITE_OTHER): Payer: Self-pay

## 2013-02-16 NOTE — Telephone Encounter (Signed)
Patient calling into office to ask about her pre-op prep.  Patient reports that she had to buy 2 (two) 32oz bottles of Gatorade.  Patient drank 1 bottle but, unable to open the second bottle.  Patient asked if she could substitute the gatorade with water.  Spoke with Huntley Dec, RN for Dr. Maisie Fus and was given the okay for the patient to drink water in place of the gatorade.  Patient aware that she need's to drink 32oz of water.  Patient aware and verbalized understanding.

## 2013-02-17 ENCOUNTER — Inpatient Hospital Stay (HOSPITAL_COMMUNITY): Payer: Medicare Other | Admitting: Anesthesiology

## 2013-02-17 ENCOUNTER — Inpatient Hospital Stay (HOSPITAL_COMMUNITY)
Admission: RE | Admit: 2013-02-17 | Discharge: 2013-02-23 | DRG: 330 | Disposition: A | Discharge status: Home Health | Payer: Medicare Other | Source: Ambulatory Visit | Attending: General Surgery | Admitting: General Surgery

## 2013-02-17 ENCOUNTER — Encounter (HOSPITAL_COMMUNITY)
Admission: RE | Disposition: A | Discharge status: Home Health | Payer: Self-pay | Source: Ambulatory Visit | Attending: General Surgery

## 2013-02-17 ENCOUNTER — Encounter (HOSPITAL_COMMUNITY): Payer: Self-pay | Admitting: *Deleted

## 2013-02-17 ENCOUNTER — Encounter (HOSPITAL_COMMUNITY): Payer: Self-pay | Admitting: Anesthesiology

## 2013-02-17 DIAGNOSIS — E119 Type 2 diabetes mellitus without complications: Secondary | ICD-10-CM | POA: Diagnosis present

## 2013-02-17 DIAGNOSIS — G4730 Sleep apnea, unspecified: Secondary | ICD-10-CM | POA: Diagnosis present

## 2013-02-17 DIAGNOSIS — K660 Peritoneal adhesions (postprocedural) (postinfection): Secondary | ICD-10-CM | POA: Diagnosis not present

## 2013-02-17 DIAGNOSIS — G8929 Other chronic pain: Secondary | ICD-10-CM | POA: Diagnosis not present

## 2013-02-17 DIAGNOSIS — Z01812 Encounter for preprocedural laboratory examination: Secondary | ICD-10-CM

## 2013-02-17 DIAGNOSIS — Z433 Encounter for attention to colostomy: Secondary | ICD-10-CM

## 2013-02-17 DIAGNOSIS — IMO0002 Reserved for concepts with insufficient information to code with codable children: Secondary | ICD-10-CM | POA: Diagnosis not present

## 2013-02-17 DIAGNOSIS — K9403 Colostomy malfunction: Secondary | ICD-10-CM | POA: Diagnosis not present

## 2013-02-17 DIAGNOSIS — D649 Anemia, unspecified: Secondary | ICD-10-CM | POA: Diagnosis not present

## 2013-02-17 DIAGNOSIS — K219 Gastro-esophageal reflux disease without esophagitis: Secondary | ICD-10-CM | POA: Diagnosis not present

## 2013-02-17 DIAGNOSIS — Z6841 Body Mass Index (BMI) 40.0 and over, adult: Secondary | ICD-10-CM

## 2013-02-17 DIAGNOSIS — I10 Essential (primary) hypertension: Secondary | ICD-10-CM | POA: Diagnosis present

## 2013-02-17 DIAGNOSIS — R339 Retention of urine, unspecified: Secondary | ICD-10-CM | POA: Diagnosis not present

## 2013-02-17 HISTORY — PX: COLOSTOMY REVERSAL: SHX5782

## 2013-02-17 LAB — TYPE AND SCREEN
ABO/RH(D): B POS
Antibody Screen: NEGATIVE

## 2013-02-17 LAB — GLUCOSE, CAPILLARY
Glucose-Capillary: 138 mg/dL — ABNORMAL HIGH (ref 70–99)
Glucose-Capillary: 171 mg/dL — ABNORMAL HIGH (ref 70–99)

## 2013-02-17 SURGERY — COLOSTOMY REVERSAL
Anesthesia: General | Site: Abdomen | Wound class: Contaminated

## 2013-02-17 MED ORDER — ROCURONIUM BROMIDE 100 MG/10ML IV SOLN
INTRAVENOUS | Status: DC | PRN
  Administered 2013-02-17: 50 mg via INTRAVENOUS
  Administered 2013-02-17 (×2): 20 mg via INTRAVENOUS
  Administered 2013-02-17: 10 mg via INTRAVENOUS
  Administered 2013-02-17: 20 mg via INTRAVENOUS
  Administered 2013-02-17: 30 mg via INTRAVENOUS
  Administered 2013-02-17: 10 mg via INTRAVENOUS

## 2013-02-17 MED ORDER — ALPRAZOLAM 1 MG PO TABS
1.0000 mg | ORAL_TABLET | Freq: Three times a day (TID) | ORAL | Status: DC | PRN
  Administered 2013-02-18 – 2013-02-23 (×7): 1 mg via ORAL
  Filled 2013-02-17 (×7): qty 1

## 2013-02-17 MED ORDER — LACTATED RINGERS IV SOLN
INTRAVENOUS | Status: DC
  Administered 2013-02-17: via INTRAVENOUS

## 2013-02-17 MED ORDER — 0.9 % SODIUM CHLORIDE (POUR BTL) OPTIME
TOPICAL | Status: DC | PRN
  Administered 2013-02-17: 2000 mL

## 2013-02-17 MED ORDER — NEOSTIGMINE METHYLSULFATE 1 MG/ML IJ SOLN
INTRAMUSCULAR | Status: DC | PRN
  Administered 2013-02-17: 4 mg via INTRAVENOUS

## 2013-02-17 MED ORDER — MIDAZOLAM HCL 5 MG/5ML IJ SOLN
INTRAMUSCULAR | Status: DC | PRN
  Administered 2013-02-17: 2 mg via INTRAVENOUS

## 2013-02-17 MED ORDER — HEPARIN SODIUM (PORCINE) 5000 UNIT/ML IJ SOLN
INTRAMUSCULAR | Status: DC | PRN
  Administered 2013-02-17: 5000 [IU] via SUBCUTANEOUS

## 2013-02-17 MED ORDER — GLYCOPYRROLATE 0.2 MG/ML IJ SOLN
INTRAMUSCULAR | Status: DC | PRN
  Administered 2013-02-17: .6 mg via INTRAVENOUS

## 2013-02-17 MED ORDER — INSULIN ASPART 100 UNIT/ML ~~LOC~~ SOLN
0.0000 [IU] | SUBCUTANEOUS | Status: DC
  Administered 2013-02-17 (×2): 3 [IU] via SUBCUTANEOUS
  Administered 2013-02-18 – 2013-02-19 (×5): 2 [IU] via SUBCUTANEOUS
  Administered 2013-02-19: 3 [IU] via SUBCUTANEOUS
  Administered 2013-02-19 – 2013-02-20 (×6): 2 [IU] via SUBCUTANEOUS

## 2013-02-17 MED ORDER — HYDROXYCHLOROQUINE SULFATE 200 MG PO TABS
200.0000 mg | ORAL_TABLET | Freq: Two times a day (BID) | ORAL | Status: DC
  Administered 2013-02-17 – 2013-02-23 (×12): 200 mg via ORAL
  Filled 2013-02-17 (×13): qty 1

## 2013-02-17 MED ORDER — FENTANYL CITRATE 0.05 MG/ML IJ SOLN
INTRAMUSCULAR | Status: DC | PRN
  Administered 2013-02-17 (×4): 50 ug via INTRAVENOUS
  Administered 2013-02-17: 100 ug via INTRAVENOUS
  Administered 2013-02-17 (×4): 50 ug via INTRAVENOUS

## 2013-02-17 MED ORDER — SODIUM CHLORIDE 0.9 % IJ SOLN: 9.0000 mL | INTRAMUSCULAR | Status: DC | PRN

## 2013-02-17 MED ORDER — TRAZODONE HCL 150 MG PO TABS
300.0000 mg | ORAL_TABLET | Freq: Every day | ORAL | Status: DC
  Administered 2013-02-17 – 2013-02-22 (×6): 300 mg via ORAL
  Filled 2013-02-17 (×7): qty 2

## 2013-02-17 MED ORDER — DIPHENHYDRAMINE HCL 50 MG/ML IJ SOLN: 12.5000 mg | Freq: Four times a day (QID) | INTRAMUSCULAR | Status: DC | PRN

## 2013-02-17 MED ORDER — ALVIMOPAN 12 MG PO CAPS
12.0000 mg | ORAL_CAPSULE | Freq: Two times a day (BID) | ORAL | Status: DC
  Administered 2013-02-18 – 2013-02-20 (×5): 12 mg via ORAL
  Filled 2013-02-17 (×8): qty 1

## 2013-02-17 MED ORDER — MORPHINE SULFATE (PF) 1 MG/ML IV SOLN
INTRAVENOUS | Status: DC
  Administered 2013-02-17: 3 mg via INTRAVENOUS
  Administered 2013-02-17: 16:00:00 via INTRAVENOUS
  Administered 2013-02-18: 18.43 mg via INTRAVENOUS
  Administered 2013-02-18: 4.9 mg via INTRAVENOUS
  Administered 2013-02-18: 9 mg via INTRAVENOUS
  Administered 2013-02-18: 6 mg via INTRAVENOUS
  Administered 2013-02-18: 09:00:00 via INTRAVENOUS
  Administered 2013-02-18: 1.02 mg via INTRAVENOUS
  Administered 2013-02-18: 6 mg via INTRAVENOUS
  Administered 2013-02-19: 1.5 mg via INTRAVENOUS
  Administered 2013-02-19: 7.5 mg via INTRAVENOUS
  Administered 2013-02-19: 4.5 mg via INTRAVENOUS
  Administered 2013-02-19 (×2): 3 mg via INTRAVENOUS
  Administered 2013-02-19: 2.85 mg via INTRAVENOUS
  Administered 2013-02-19: 3 mg via INTRAVENOUS
  Administered 2013-02-20: 1.5 mg via INTRAVENOUS
  Administered 2013-02-20: 0.943 mg via INTRAVENOUS
  Filled 2013-02-17 (×3): qty 25

## 2013-02-17 MED ORDER — LACTATED RINGERS IR SOLN
Status: DC | PRN
  Administered 2013-02-17: 5000 mL

## 2013-02-17 MED ORDER — DEXTROSE 5 % IV SOLN
2.0000 g | INTRAVENOUS | Status: AC
  Administered 2013-02-17: 2 g via INTRAVENOUS
  Filled 2013-02-17: qty 2

## 2013-02-17 MED ORDER — DEXTROSE 5 % IV SOLN
2.0000 g | Freq: Two times a day (BID) | INTRAVENOUS | Status: AC
  Administered 2013-02-17: 2 g via INTRAVENOUS
  Filled 2013-02-17: qty 2

## 2013-02-17 MED ORDER — PANTOPRAZOLE SODIUM 40 MG PO TBEC
80.0000 mg | DELAYED_RELEASE_TABLET | Freq: Every day | ORAL | Status: DC
  Administered 2013-02-17 – 2013-02-23 (×7): 80 mg via ORAL
  Filled 2013-02-17 (×2): qty 2
  Filled 2013-02-17: qty 1
  Filled 2013-02-17 (×3): qty 2
  Filled 2013-02-17: qty 1
  Filled 2013-02-17: qty 2

## 2013-02-17 MED ORDER — DIPHENHYDRAMINE HCL 12.5 MG/5ML PO ELIX: 12.5000 mg | ORAL_SOLUTION | Freq: Four times a day (QID) | ORAL | Status: DC | PRN

## 2013-02-17 MED ORDER — BUPROPION HCL ER (XL) 300 MG PO TB24
450.0000 mg | ORAL_TABLET | Freq: Every morning | ORAL | Status: DC
  Administered 2013-02-18 – 2013-02-23 (×6): 450 mg via ORAL
  Filled 2013-02-17 (×6): qty 1

## 2013-02-17 MED ORDER — PROMETHAZINE HCL 25 MG/ML IJ SOLN: 6.2500 mg | INTRAMUSCULAR | Status: DC | PRN

## 2013-02-17 MED ORDER — HYDROMORPHONE HCL PF 1 MG/ML IJ SOLN
0.2500 mg | INTRAMUSCULAR | Status: DC | PRN
  Administered 2013-02-17 (×2): 0.5 mg via INTRAVENOUS

## 2013-02-17 MED ORDER — BUPIVACAINE-EPINEPHRINE PF 0.5-1:200000 % IJ SOLN
INTRAMUSCULAR | Status: DC | PRN
  Administered 2013-02-17: 30 mL

## 2013-02-17 MED ORDER — ARIPIPRAZOLE 5 MG PO TABS
5.0000 mg | ORAL_TABLET | Freq: Every day | ORAL | Status: DC
  Administered 2013-02-17 – 2013-02-22 (×6): 5 mg via ORAL
  Filled 2013-02-17 (×7): qty 1

## 2013-02-17 MED ORDER — PROPOFOL 10 MG/ML IV BOLUS
INTRAVENOUS | Status: DC | PRN
  Administered 2013-02-17: 200 mg via INTRAVENOUS

## 2013-02-17 MED ORDER — ONDANSETRON HCL 4 MG/2ML IJ SOLN
4.0000 mg | Freq: Four times a day (QID) | INTRAMUSCULAR | Status: DC | PRN
  Administered 2013-02-18 – 2013-02-19 (×3): 4 mg via INTRAVENOUS
  Filled 2013-02-17 (×3): qty 2

## 2013-02-17 MED ORDER — KETOROLAC TROMETHAMINE 30 MG/ML IJ SOLN: 15.0000 mg | Freq: Once | INTRAMUSCULAR | Status: DC | PRN

## 2013-02-17 MED ORDER — SUCCINYLCHOLINE CHLORIDE 20 MG/ML IJ SOLN
INTRAMUSCULAR | Status: DC | PRN
  Administered 2013-02-17: 100 mg via INTRAVENOUS

## 2013-02-17 MED ORDER — LACTATED RINGERS IV SOLN
INTRAVENOUS | Status: DC
  Administered 2013-02-17 (×2): via INTRAVENOUS
  Administered 2013-02-17: 1000 mL via INTRAVENOUS
  Administered 2013-02-17 (×2): via INTRAVENOUS

## 2013-02-17 MED ORDER — ENOXAPARIN SODIUM 40 MG/0.4ML ~~LOC~~ SOLN
40.0000 mg | SUBCUTANEOUS | Status: DC
  Administered 2013-02-18 – 2013-02-23 (×6): 40 mg via SUBCUTANEOUS
  Filled 2013-02-17 (×7): qty 0.4

## 2013-02-17 MED ORDER — NALOXONE HCL 0.4 MG/ML IJ SOLN: 0.4000 mg | INTRAMUSCULAR | Status: DC | PRN

## 2013-02-17 MED ORDER — ALVIMOPAN 12 MG PO CAPS
12.0000 mg | ORAL_CAPSULE | Freq: Once | ORAL | Status: AC
  Administered 2013-02-17: 12 mg via ORAL
  Filled 2013-02-17: qty 1

## 2013-02-17 MED ORDER — LORATADINE 10 MG PO TABS
10.0000 mg | ORAL_TABLET | Freq: Every day | ORAL | Status: DC
  Administered 2013-02-17 – 2013-02-23 (×7): 10 mg via ORAL
  Filled 2013-02-17 (×7): qty 1

## 2013-02-17 MED ORDER — MEPERIDINE HCL 50 MG/ML IJ SOLN
6.2500 mg | INTRAMUSCULAR | Status: DC | PRN
  Administered 2013-02-17 (×2): 12.5 mg via INTRAVENOUS

## 2013-02-17 MED ORDER — ONDANSETRON HCL 4 MG/2ML IJ SOLN
INTRAMUSCULAR | Status: DC | PRN
  Administered 2013-02-17: 4 mg via INTRAVENOUS

## 2013-02-17 MED ORDER — ESCITALOPRAM OXALATE 20 MG PO TABS
20.0000 mg | ORAL_TABLET | Freq: Every morning | ORAL | Status: DC
  Administered 2013-02-18 – 2013-02-23 (×6): 20 mg via ORAL
  Filled 2013-02-17 (×6): qty 1

## 2013-02-17 MED ORDER — EPHEDRINE SULFATE 50 MG/ML IJ SOLN
INTRAMUSCULAR | Status: DC | PRN
  Administered 2013-02-17 (×2): 10 mg via INTRAVENOUS

## 2013-02-17 SURGICAL SUPPLY — 57 items
APPLICATOR COTTON TIP 6IN STRL (MISCELLANEOUS) ×2 IMPLANT
BANDAGE GAUZE ELAST BULKY 4 IN (GAUZE/BANDAGES/DRESSINGS) ×2 IMPLANT
BLADE EXTENDED COATED 6.5IN (ELECTRODE) IMPLANT
BLADE HEX COATED 2.75 (ELECTRODE) IMPLANT
CANISTER SUCTION 2500CC (MISCELLANEOUS) ×2 IMPLANT
CELLS DAT CNTRL 66122 CELL SVR (MISCELLANEOUS) ×1 IMPLANT
CHLORAPREP W/TINT 26ML (MISCELLANEOUS) ×4 IMPLANT
CLOTH BEACON ORANGE TIMEOUT ST (SAFETY) ×2 IMPLANT
COVER MAYO STAND STRL (DRAPES) ×4 IMPLANT
DRAPE LAPAROSCOPIC ABDOMINAL (DRAPES) ×2 IMPLANT
DRAPE LG THREE QUARTER DISP (DRAPES) IMPLANT
DRAPE WARM FLUID 44X44 (DRAPE) ×2 IMPLANT
ELECT REM PT RETURN 9FT ADLT (ELECTROSURGICAL) ×2
ELECTRODE REM PT RTRN 9FT ADLT (ELECTROSURGICAL) ×1 IMPLANT
GLOVE BIOGEL PI IND STRL 7.0 (GLOVE) ×4 IMPLANT
GLOVE BIOGEL PI INDICATOR 7.0 (GLOVE) ×4
GOWN STRL REIN XL XLG (GOWN DISPOSABLE) ×26 IMPLANT
KIT BASIN OR (CUSTOM PROCEDURE TRAY) ×2 IMPLANT
LEGGING LITHOTOMY PAIR STRL (DRAPES) ×2 IMPLANT
LIGASURE IMPACT 36 18CM CVD LR (INSTRUMENTS) IMPLANT
NEEDLE SPNL 22GX3.5 QUINCKE BK (NEEDLE) ×2 IMPLANT
NS IRRIG 1000ML POUR BTL (IV SOLUTION) ×4 IMPLANT
PACK GENERAL/GYN (CUSTOM PROCEDURE TRAY) ×2 IMPLANT
PAD ABD 7.5X8 STRL (GAUZE/BANDAGES/DRESSINGS) ×2 IMPLANT
RELOAD BLUE (STAPLE) ×8 IMPLANT
RTRCTR WOUND ALEXIS 18CM MED (MISCELLANEOUS) ×2
SCISSORS LAP 5X45 EPIX DISP (ENDOMECHANICALS) ×2 IMPLANT
SEALER TISSUE G2 CVD JAW 35 (ENDOMECHANICALS) ×1 IMPLANT
SEALER TISSUE G2 CVD JAW 45CM (ENDOMECHANICALS) ×1
SLEEVE XCEL OPT CAN 5 100 (ENDOMECHANICALS) ×4 IMPLANT
SPONGE GAUZE 4X4 12PLY (GAUZE/BANDAGES/DRESSINGS) ×2 IMPLANT
SPONGE LAP 18X18 X RAY DECT (DISPOSABLE) IMPLANT
STAPLE ECHEON FLEX 60 POW ENDO (STAPLE) ×2 IMPLANT
STAPLER CIRC ILS CVD 33MM 37CM (STAPLE) ×2 IMPLANT
STAPLER VISISTAT 35W (STAPLE) ×2 IMPLANT
SUCTION POOLE TIP (SUCTIONS) ×2 IMPLANT
SUT PDS AB 1 CTX 36 (SUTURE) IMPLANT
SUT PDS AB 1 TP1 96 (SUTURE) IMPLANT
SUT PROLENE 2 0 BLUE (SUTURE) IMPLANT
SUT PROLENE 2 0 KS (SUTURE) ×2 IMPLANT
SUT SILK 2 0 (SUTURE) ×1
SUT SILK 2 0 SH CR/8 (SUTURE) ×2 IMPLANT
SUT SILK 2 0SH CR/8 30 (SUTURE) IMPLANT
SUT SILK 2-0 18XBRD TIE 12 (SUTURE) ×1 IMPLANT
SUT SILK 2-0 30XBRD TIE 12 (SUTURE) IMPLANT
SUT SILK 3 0 (SUTURE) ×1
SUT SILK 3 0 SH CR/8 (SUTURE) ×6 IMPLANT
SUT SILK 3-0 18XBRD TIE 12 (SUTURE) ×1 IMPLANT
SUT VIC AB 2-0 CT2 27 (SUTURE) ×2 IMPLANT
SUT VIC AB 4-0 PS2 18 (SUTURE) ×4 IMPLANT
SYS LAPSCP GELPORT 120MM (MISCELLANEOUS) ×2
SYSTEM LAPSCP GELPORT 120MM (MISCELLANEOUS) ×1 IMPLANT
TAPE CLOTH SURG 4X10 WHT LF (GAUZE/BANDAGES/DRESSINGS) ×2 IMPLANT
TOWEL OR 17X26 10 PK STRL BLUE (TOWEL DISPOSABLE) ×4 IMPLANT
TRAY FOLEY CATH 14FRSI W/METER (CATHETERS) ×2 IMPLANT
TROCAR BLADELESS OPT 5 150 (ENDOMECHANICALS) ×8 IMPLANT
TROCAR XCEL NON-BLD 5MMX100MML (ENDOMECHANICALS) ×2 IMPLANT

## 2013-02-17 NOTE — Anesthesia Postprocedure Evaluation (Signed)
  Anesthesia Post-op Note  Patient: Jasmine Huff  Procedure(s) Performed: Procedure(s) (LRB):  LAPAROSCOPIC COLOSTOMY REVERSAL; SMALL BOWEL RESECTION; LYSIS OF ADHESIONS X 3 HOURS (N/A)  Patient Location: PACU  Anesthesia Type: General  Level of Consciousness: awake and alert   Airway and Oxygen Therapy: Patient Spontanous Breathing  Post-op Pain: mild  Post-op Assessment: Post-op Vital signs reviewed, Patient's Cardiovascular Status Stable, Respiratory Function Stable, Patent Airway and No signs of Nausea or vomiting  Last Vitals:  Filed Vitals:   02/17/13 1645  BP: 134/64  Pulse: 66  Temp:   Resp: 19    Post-op Vital Signs: stable   Complications: No apparent anesthesia complications

## 2013-02-17 NOTE — Transfer of Care (Signed)
Immediate Anesthesia Transfer of Care Note  Patient: Jasmine Huff  Procedure(s) Performed: Procedure(s):  LAPAROSCOPIC COLOSTOMY REVERSAL; SMALL BOWEL RESECTION; LYSIS OF ADHESIONS X 3 HOURS (N/A)  Patient Location: PACU  Anesthesia Type:General  Level of Consciousness: awake, alert , oriented and patient cooperative  Airway & Oxygen Therapy: Patient Spontanous Breathing and Patient connected to face mask oxygen  Post-op Assessment: Report given to PACU RN, Post -op Vital signs reviewed and stable and Patient moving all extremities  Post vital signs: Reviewed and stable  Complications: No apparent anesthesia complications

## 2013-02-17 NOTE — Op Note (Signed)
02/17/2013  3:27 PM  PATIENT:  Jasmine Huff  57 y.o. female  Patient Care Team: [name], MD as PCP - General (Family Medicine)  PRE-OPERATIVE DIAGNOSIS:  ostomy dysfunction   POST-OPERATIVE DIAGNOSIS:  OSTOMY DYSFUNCTION   PROCEDURE:  LAPAROSCOPIC COLOSTOMY REVERSAL; SMALL BOWEL RESECTION; LYSIS OF ADHESIONS X 3 HOURS  SURGEON:  Surgeon(s): Romie Levee, MD Ardeth Sportsman, MD  ASSISTANT: Michaell Cowing   ANESTHESIA:   general  EBL:  Total I/O In: 3000 [I.V.:3000] Out: 300 [Urine:100; Blood:200]  Delay start of Pharmacological VTE agent (>24hrs) due to surgical blood loss or risk of bleeding:  no  DRAINS: none   SPECIMEN:  Source of Specimen:  small bowel, rectal stump  DISPOSITION OF SPECIMEN:  PATHOLOGY  COUNTS:  YES  PLAN OF CARE: Admit to inpatient   PATIENT DISPOSITION:  PACU - hemodynamically stable.  INDICATION: is a 57 year old female who presented to the hospital last September with a perforated sigmoid colon. She underwent a Hartman's procedure. She spent the last 9 months recovering from this procedure. She is here today for ostomy reversal.  OR FINDINGS: significant intra-abdominal adhesions  DESCRIPTION: The patient was identified in the preoperative holding area and taken to the OR where they were laid supine on the operating room table in lithotomy position. General anesthesia was smoothly induced.  The patient was then prepped and draped in the usual sterile fashion. A surgical timeout was performed indicating the correct patient, procedure, positioning and preoperative antibiotics. SCDs were noted to be in place and functioning prior to the operation.  I began the procedure by performing an incision in left upper quadrant just below the level of the last rib. An Optiview entry trocar was placed into this site and entry was gained into the abdomen without difficulty.  A 5 mm angled scope was placed into the patient's abdomen the abdomen was  insufflated to approximately 15 mm mercury. There are no injuries upon entry.  We began by taking down the abdominal wall adhesions using blunt and sharp dissection. After this was completed a separate port was placed in the left lower quadrant under direct visualization. 3 ports were placed in the right side of the abdomen also under direct visualization. After this was completed we identified the rectal stump adherent to the wall with the previously placed suture. This was taken down. The small bowel that was adherent to this was carefully dissected free from the rectal stump using laparoscopic scissors. I was able to mobilize all the small bowel except for one loop which was very adherent to the rectal stump. There were dense adhesions which did not allow me to identify the plane between the 2. I decided to perform a resection of this portion of small bowel. This will be done later in the case. I transected the area using scissors. The rectal stump was then mobilized. This was done using the Enseal device.  After this was completed the ostomy was taken down using Bovie electrocautery and blunt dissection. The hernia sac was removed. Once the entire ostomy was mobilized. A Coker clamp was placed proximal to the area that was resected. The bowel was then transected using scissors. There was good bleeding noted. EEA sizers are brought onto the field. It was determined the colon could easily accompany a 33 mm stapler. An EEA bluest stapler was brought onto the field. A pursestring device was used to place a 2-0 Prolene pursestring around the anastomotic site. This was then closed around the  anvil after being secured into position.  This was then brought back into the abdomen. I then placed a GelPort onto the ostomy site.  The abdomen was then reinsufflated.  The proximal portion of the rectal stump, which most likely was the remaining sigmoid colon, was mobilized. The mesentery was transected using the Enseal device.   The rectal stump was then divided using a laparoscopic 60 mm blue load stapler. This was removed through the ostomy site.  After this was completed I was able to get the stapler all the way up to the staple line. The previous ostomy was then brought down and our anastomosis was completed without tension. The 2 doughnuts were noted to be completely intact. The rectum was insufflated using the proctoscope. The staple line appeared to be intact. There were no air leaks. There was some small staple line bleeding which appeared to be mostly stopped. The rectum was then evacuated.   At this point I terminated into the small bowel. This was brought out through the ostomy site. The area that was adherent to the rectal stump was transected using a GIA blue load stapler.   We transected approximately 6-8 cm in total.  The mesentery was transected using the Enseal device. This was sent to pathology for further examination. The common enterotomy was made in both portions of the small bowel and the upper scrotum stapler was used to create an anastomosis. An anti-tension suture was placed in the distal portion of the anastomosis. The common enterotomy channel was closed using interrupted 3-0 silk sutures. This was then oversewn using interrupted 2-0 silk sutures. The mesenteric defect was also closed using interrupted 2-0 silk sutures. The anastomosis was patent and hemostatic. This was placed back into the abdomen. We then changed to clean instruments and clean gown and gloves. I insufflated the abdomen once again. The abdomen is copiously irrigated with multiple liters of warm saline. Hemostasis was good. After this was completed the fascia of the ostomy site was closed in a transverse manner using interrupted #1 Novafil sutures.  The subcutaneous tissue was then brought together with a 2-0 Vicryl pursestring suture. The port site skin was closed using interrupted 4-0 Vicryl sutures. Dermabond was used to close the sites as  well. The ostomy site was packed with Kerlix gauze and cover with a dry sterile dressing. The patient was then awakened from anesthesia and sent to the postanesthesia care unit in stable condition. All counts were correct per operating room staff.

## 2013-02-17 NOTE — H&P (Signed)
Chief Complaint   Patient presents with   .  Routine Post Op     f/u colonoscopy   HISTORY: Jasmine Huff is a 57 y.o. female who presents to clinic with a diverting ostomy after sigmoid perforation.  She is here today for ostomy reversal.   Past Medical History   Diagnosis  Date   .  Arthritis    .  Depression    .  Allergy    .  Hypertension    .  Migraine    .  UTI (lower urinary tract infection)    .  Fibromyalgia    .  Chronic pain syndrome    .  GERD (gastroesophageal reflux disease)  01/21/2011   .  Anxiety    .  Panic attacks    .  High blood pressure      per pt, resolved and no longer on BP meds   .  Knee pain    .  Urination frequency    .  Diabetes mellitus type II      per pt, resolved, last several A1c OFF of meds have been < 6.5.   .  Perforation of colon  04/08/2012   .  Sleep apnea      CPAP   .  Anemia     Past Surgical History   Procedure  Laterality  Date   .  Cholecystectomy     .  Abdominal hysterectomy     .  Right ovary removal     .  Left hemicolectomy with splenic flexure mobilization and end colostomy   04/04/2012   .  Laparotomy   04/04/2012     Procedure: EXPLORATORY LAPAROTOMY; Surgeon: Romie Levee, MD; Location: [facility]; Service: General; Laterality: N/A; left colectomy   .  Colostomy   04/04/2012     Procedure: COLOSTOMY; Surgeon: Romie Levee, MD; Location: [facility]; Service: General; Laterality: N/A;   .  Colon surgery   Oct 2013     perf bowel w/ colostomy   .  Colonoscopy  N/A  11/12/2012     Procedure: COLONOSCOPY; Surgeon: Romie Levee, MD; Location: [facility]; Service: Endoscopy; Laterality: N/A;    Current Outpatient Prescriptions   Medication  Sig  Dispense  Refill   .  ALPRAZolam (XANAX) 1 MG tablet      .  aspirin EC 81 MG tablet  Take 81 mg by mouth daily.     .  cetirizine (ZYRTEC) 10 MG tablet  Take 10 mg by mouth daily.     Marland Kitchen  escitalopram (LEXAPRO) 20 MG tablet      .  Ferrous Gluconate (IRON) 240 (27 FE) MG TABS  Take 1  tablet by mouth 2 (two) times daily.     .  Flaxseed, Linseed, (FLAX SEED OIL) 1000 MG CAPS  Take 1 capsule by mouth daily.     .  hydroxychloroquine (PLAQUENIL) 200 MG tablet  Take 200 mg by mouth 2 (two) times daily.     .  Multiple Vitamin (MULTIVITAMIN WITH MINERALS) TABS  Take 1 tablet by mouth daily.     Marland Kitchen  omeprazole (PRILOSEC) 40 MG capsule      .  ondansetron (ZOFRAN) 4 MG tablet  Take 4 mg by mouth every 8 (eight) hours as needed. For nausea.     .  OPANA ER, CRUSH RESISTANT, 10 MG T12A 12 hr tablet      .  oxyCODONE-acetaminophen (PERCOCET/ROXICET) 5-325 MG per tablet  Take 1-2 tablets by mouth every 4 (four) hours as needed. For pain.  30 tablet  0   .  polyethylene glycol (MIRALAX / GLYCOLAX) packet  Take 17 g by mouth daily.     .  temazepam (RESTORIL) 15 MG capsule  Take 45 mg by mouth at bedtime.     .  traZODone (DESYREL) 100 MG tablet  Take 200 mg by mouth at bedtime.     .  [DISCONTINUED] solifenacin (VESICARE) 10 MG tablet  Take 0.5 tablets (5 mg total) by mouth daily.  30 tablet  1   .  [DISCONTINUED] tolterodine (DETROL LA) 4 MG 24 hr capsule  Take 1 capsule (4 mg total) by mouth daily.  30 capsule  2    No current facility-administered medications for this visit.    Allergies   Allergen  Reactions   .  Pregabalin  Rash     Alters Mental status, flushing   .  Saphris (Asenapine Maleate)  Shortness Of Breath and Other (See Comments)     Lips tingle and tongue becomes numb/Akasthesia   .  Iodinated Diagnostic Agents      Dye used for CT scans, severe hives   .  Seroquel (Quetiapine Fumarate)     Family History   Problem  Relation  Age of Onset   .  Depression  Mother    .  Depression  Father    .  Alcohol abuse  Brother    .  Depression  Brother    .  Depression  Maternal Grandfather    .  Depression  Other     History    Social History   .  Marital Status:  Divorced     Spouse Name:  N/A     Number of Children:  0   .  Years of Education:  N/A     Occupational History   .  disabled     Social History Main Topics   .  Smoking status:  Never Smoker   .  Smokeless tobacco:  Never Used   .  Alcohol Use:  No   .  Drug Use:  No   .  Sexually Active:  No    Other Topics  Concern   .  None    Social History Narrative   .  None   REVIEW OF SYSTEMS - PERTINENT POSITIVES ONLY:  Review of Systems - General ROS: negative for - chills or fever  Hematological and Lymphatic ROS: negative for - bleeding problems, blood clots or bruising  Respiratory ROS: no cough, shortness of breath, or wheezing  Cardiovascular ROS: no chest pain or dyspnea on exertion  Gastrointestinal ROS: no abdominal pain, change in bowel habits, or black or bloody stools  EXAM:  Filed Vitals:   02/17/13 0653  BP: 136/88  Pulse: 76  Temp: 98.9 F (37.2 C)  Resp: 16   General appearance: alert and cooperative  Resp: clear to auscultation bilaterally  Cardio: regular rate and rhythm  GI: normal findings: soft, non-tender and abnormal findings: infra-umbilical scar, healing surgical wound  LABORATORY RESULTS:  Available labs are reviewed  Lab Results   Component  Value  Date    WBC  8.8  09/15/2012    HGB  10.5*  09/15/2012    HCT  36.4  09/15/2012    MCV  76.2*  09/15/2012    PLT  294  09/15/2012    Lab Results   Component  Value  Date    HGBA1C  6.2*  04/07/2012   ASSESSMENT AND PLAN:  Jasmine Huff is doing well. She is about 11 months out from sigmoid resection due to perforation. She is moving better now, and her diabetes is controlled. We discussed the risks of surgical ostomy reversal. The surgery and anatomy were described to the patient as well as the risks of surgery and the possible complications.  These include: Bleeding, infection and possible wound complications such as hernia, damage to adjacent structures, leak of surgical connections, which can lead to other surgeries and possibly an ostomy (5-7%), possible need for other procedures, such as  abscess drains in radiology, possible prolonged hospital stay, possible diarrhea from removal of part of the colon, possible constipation from narcotics, prolonged fatigue/weakness or appetite loss, possible complications of their medical problems such as heart disease or arrhythmias or lung problems, death (less than 1%). I believe the patient understands and wishes to proceed with the surgery.   Vanita Panda, MD  Colon and Rectal Surgery / General Surgery  [facility]

## 2013-02-17 NOTE — Anesthesia Preprocedure Evaluation (Signed)
Anesthesia Evaluation  Patient identified by MRN, date of birth, ID band Patient awake    Reviewed: Allergy & Precautions, H&P , NPO status , Patient's Chart, lab work & pertinent test results  Airway Mallampati: II TM Distance: <3 FB Neck ROM: Full    Dental no notable dental hx.    Pulmonary sleep apnea ,  breath sounds clear to auscultation  Pulmonary exam normal       Cardiovascular hypertension, Pt. on medications Rhythm:Regular Rate:Normal     Neuro/Psych negative neurological ROS  negative psych ROS   GI/Hepatic negative GI ROS, Neg liver ROS,   Endo/Other  diabetes, Type obesity  Renal/GU negative Renal ROS  negative genitourinary   Musculoskeletal negative musculoskeletal ROS (+)   Abdominal   Peds negative pediatric ROS (+)  Hematology negative hematology ROS (+)   Anesthesia Other Findings   Reproductive/Obstetrics negative OB ROS                           Anesthesia Physical Anesthesia Plan  ASA: III  Anesthesia Plan: General   Post-op Pain Management:    Induction: Intravenous  Airway Management Planned: Oral ETT  Additional Equipment:   Intra-op Plan:   Post-operative Plan: Extubation in OR  Informed Consent: I have reviewed the patients History and Physical, chart, labs and discussed the procedure including the risks, benefits and alternatives for the proposed anesthesia with the patient or authorized representative who has indicated his/her understanding and acceptance.   Dental advisory given  Plan Discussed with: CRNA and Surgeon  Anesthesia Plan Comments:         Anesthesia Quick Evaluation

## 2013-02-17 NOTE — Preoperative (Signed)
Beta Blockers   Reason not to administer Beta Blockers:Not Applicable 

## 2013-02-18 ENCOUNTER — Encounter (HOSPITAL_COMMUNITY): Payer: Self-pay | Admitting: General Surgery

## 2013-02-18 LAB — CBC
MCH: 26.5 pg (ref 26.0–34.0)
MCHC: 30.9 g/dL (ref 30.0–36.0)
Platelets: 303 10*3/uL (ref 150–400)
RBC: 4.72 MIL/uL (ref 3.87–5.11)

## 2013-02-18 LAB — BASIC METABOLIC PANEL
Calcium: 8.5 mg/dL (ref 8.4–10.5)
GFR calc Af Amer: 81 mL/min — ABNORMAL LOW (ref >90)
GFR calc non Af Amer: 70 mL/min — ABNORMAL LOW (ref >90)
Glucose, Bld: 138 mg/dL — ABNORMAL HIGH (ref 70–99)
Sodium: 135 mEq/L (ref 135–145)

## 2013-02-18 LAB — GLUCOSE, CAPILLARY
Glucose-Capillary: 130 mg/dL — ABNORMAL HIGH (ref 70–99)
Glucose-Capillary: 142 mg/dL — ABNORMAL HIGH (ref 70–99)

## 2013-02-18 MED ORDER — KCL IN DEXTROSE-NACL 20-5-0.45 MEQ/L-%-% IV SOLN
INTRAVENOUS | Status: DC
  Administered 2013-02-18 – 2013-02-20 (×5): via INTRAVENOUS
  Administered 2013-02-20: 50 mL/h via INTRAVENOUS
  Administered 2013-02-21: 22:00:00 via INTRAVENOUS
  Filled 2013-02-18 (×9): qty 1000

## 2013-02-18 NOTE — Evaluation (Signed)
Occupational Therapy Evaluation Patient Details Name: Jasmine Huff MRN: [id number] DOB: 1955/07/22 Today's Date: 02/18/2013 Time: 4098-1191 OT Time Calculation (min): 16 min  OT Assessment / Plan / Recommendation History of present illness admitted 02/17/13 for laprascopic reversal of colostomy and small bowel rescetion.   Clinical Impression   Pt was admitted for the above procedure.  Prior to admission, pt was mod I with all adls.  She is appropriate for skilled OT to increase safety and independence with adls with supervision to min A level goals in acute.  Pt is limited by pain and bil UE's have limited ROM.      OT Assessment  Patient needs continued OT Services    Follow Up Recommendations  Home health OT;Supervision - Intermittent    Barriers to Discharge      Equipment Recommendations  None recommended by OT    Recommendations for Other Services    Frequency  Min 2X/week    Precautions / Restrictions Precautions Precaution Comments: abd. wound. Restrictions Weight Bearing Restrictions: No   Pertinent Vitals/Pain Pain throughout, used PCA.  Pain not rated. Repositioned    ADL  Eating/Feeding: NPO (sips only and ice) Grooming: Minimal assistance (for hair) Where Assessed - Grooming: Unsupported sitting Upper Body Bathing: Set up Where Assessed - Upper Body Bathing: Unsupported sitting Lower Body Bathing: Maximal assistance Where Assessed - Lower Body Bathing: Supported sit to stand Upper Body Dressing: Minimal assistance Where Assessed - Upper Body Dressing: Supported sitting Lower Body Dressing: Maximal assistance Where Assessed - Lower Body Dressing: Supported sit to Statistician and Hygiene: Minimal assistance Where Assessed - Engineer, mining and Hygiene: Sit to stand from 3-in-1 or toilet Equipment Used: Rolling walker Transfers/Ambulation Related to ADLs: stood and side stepped up bed.  Assistance for legs and  positioning pillows behind her back ADL Comments: pt has chronic pain but has been able to perform adls without AE by bringing legs up on bed; not able to do this today due to surgery.    OT Diagnosis: Generalized weakness  OT Problem List: Decreased strength;Decreased activity tolerance;Decreased knowledge of use of DME or AE;Pain OT Treatment Interventions: Self-care/ADL training;DME and/or AE instruction;Patient/family education   OT Goals(Current goals can be found in the care plan section) Acute Rehab OT Goals Patient Stated Goal: I want to get up.I want to go home. OT Goal Formulation: With patient Time For Goal Achievement: 03/04/13 Potential to Achieve Goals: Good ADL Goals Pt Will Perform Lower Body Bathing: with min assist (prop foot up on bed, AE as needed) Pt Will Perform Lower Body Dressing: with min assist (prop foot on bed; ae as needed) Pt Will Transfer to Toilet: with supervision;ambulating;bedside commode Pt Will Perform Toileting - Clothing Manipulation and hygiene: with supervision;sit to/from stand  Visit Information  Last OT Received On: 02/18/13 Assistance Needed: +2 (walk)        Prior Functioning     Home Living Family/patient expects to be discharged to:: Private residence Living Arrangements: Alone/ pt states she can have assistance as needed Type of Home: Apartment Home Access: Level entry Home Layout: One level Home Equipment: Walker - 2 wheels;Tub bench Additional Comments: planning to Dc home Prior Function Level of Independence: Independent with assistive device(s) Communication Communication: No difficulties         Vision/Perception     Cognition  Cognition Arousal/Alertness: Awake/alert Behavior During Therapy: WFL for tasks assessed/performed Overall Cognitive Status: Within Functional Limits for tasks assessed    Extremity/Trunk  Assessment Upper Extremity Assessment Upper Extremity Assessment: RUE deficits/detail;LUE  deficits/detail RUE Deficits / Details: bil shoulders need TSA per pt.  able to reach to head bil but could not reach up to part today LUE Deficits / Details: see LUE Lower Extremity Assessment Lower Extremity Assessment: Generalized weakness Cervical / Trunk Assessment Cervical / Trunk Assessment: Normal     Mobility Bed Mobility Bed Mobility: Right Sidelying to Sit;Sitting - Scoot to Edge of Bed Right Sidelying to Sit: HOB elevated;With rails;4: Min assist Sitting - Scoot to Edge of Bed: 4: Min guard;With rail Sit to Sidelying Right: 4: Min assist;With rail Details for Bed Mobility Assistance: assistance for legs back up on bed Transfers Sit to Stand: 4: Min assist;From bed;From elevated surface  Stand to Sit: 4: Min assist;With upper extremity assist Details for Transfer Assistance: stood and side stepped up hob with min guard     Exercise     Balance Balance Balance Assessed: Yes Static Standing Balance Static Standing - Balance Support: Bilateral upper extremity supported Static Standing - Level of Assistance: 5: Stand by assistance   End of Session OT - End of Session Activity Tolerance: Patient tolerated treatment well Patient left: in bed;with call bell/phone within reach Nurse Communication:  (pt had coke in room--clarify if OK and ?straw)  GO     [name] 02/18/2013, 1:54 PM Marica Otter, OTR/L [phone] 02/18/2013

## 2013-02-18 NOTE — Evaluation (Signed)
Physical Therapy Evaluation Patient Details Name: Jasmine Huff MRN: [id number] DOB: Jan 27, 1956 Today's Date: 02/18/2013 Time: 4098-1191 PT Time Calculation (min): 24 min  PT Assessment / Plan / Recommendation History of Present Illness  admitted 02/17/13 for labrascopic reversal of colostomy and small bowel rescetion.  Clinical Impression  Pt mobilized well today. Pt initially was very aprehensive  And was very pleased with how she did. Pt plans to DC to home. Pt will benefit from PT while in acute care. Recommend HHPT  PT Assessment  Patient needs continued PT services    Follow Up Recommendations  Home health PT    Does the patient have the potential to tolerate intense rehabilitation      Barriers to Discharge        Equipment Recommendations  None recommended by PT    Recommendations for Other Services     Frequency Min 3X/week    Precautions / Restrictions Precautions Precaution Comments: abd. wound. Restrictions Weight Bearing Restrictions: No   Pertinent Vitals/Pain Has PCA- pain 4-5 abd.      Mobility  Bed Mobility Bed Mobility: Right Sidelying to Sit;Sitting - Scoot to Edge of Bed Right Sidelying to Sit: HOB elevated;With rails;4: Min assist Sitting - Scoot to Edge of Bed: 4: Min guard Details for Bed Mobility Assistance: Pt was on her R side in bed. pt was able to push self up to elbow, min  assistance to get trunk upright.Pt placed legs over edge w/o assistance. Transfers Transfers: Sit to Stand;Stand to Sit;Stand Pivot Transfers Sit to Stand: 1: +2 Total assist;From elevated surface;With upper extremity assist;From bed Sit to Stand: Patient Percentage: 70% Stand to Sit: 4: Min assist;With upper extremity assist Stand Pivot Transfers: 1: +2 Total assist Stand Pivot Transfers: Patient Percentage: 70% Details for Transfer Assistance: Pt stood much better than she anticipated.  Ambulation/Gait Ambulation/Gait Assistance: Not tested (comment) Assistive  device: Rolling walker    Exercises     PT Diagnosis: Generalized weakness;Acute pain  PT Problem List: Decreased strength;Decreased activity tolerance;Decreased mobility;Cardiopulmonary status limiting activity;Decreased safety awareness;Decreased knowledge of use of DME;Decreased knowledge of precautions;Pain PT Treatment Interventions: DME instruction;Gait training;Functional mobility training;Therapeutic activities;Therapeutic exercise;Patient/family education     PT Goals(Current goals can be found in the care plan section) Acute Rehab PT Goals Patient Stated Goal: I want to get up.I want to go home. PT Goal Formulation: With patient Time For Goal Achievement: 03/04/13 Potential to Achieve Goals: Good  Visit Information  Last PT Received On: 02/18/13 Assistance Needed: +2 (to walk) History of Present Illness: admitted 02/17/13 for labrascopic reversal of colostomy and small bowel rescetion.       Prior Functioning  Home Living Family/patient expects to be discharged to:: Private residence Living Arrangements: Alone Type of Home: Apartment Home Access: Level entry Home Layout: One level Home Equipment: Walker - 2 wheels;Tub bench Additional Comments: planning to Dc home Prior Function Level of Independence: Independent with assistive device(s) Communication Communication: No difficulties    Cognition  Cognition Arousal/Alertness: Awake/alert Behavior During Therapy: WFL for tasks assessed/performed Overall Cognitive Status: Within Functional Limits for tasks assessed    Extremity/Trunk Assessment Upper Extremity Assessment Upper Extremity Assessment: Defer to OT evaluation Lower Extremity Assessment Lower Extremity Assessment: Generalized weakness Cervical / Trunk Assessment Cervical / Trunk Assessment: Normal   Balance Balance Balance Assessed: Yes Static Standing Balance Static Standing - Balance Support: Bilateral upper extremity supported Static Standing -  Level of Assistance: 5: Stand by assistance  End of Session PT - End  of Session Activity Tolerance: Patient tolerated treatment well Patient left: in chair;with call bell/phone within reach Nurse Communication: Mobility status  GP     Kambre, Messner 02/18/2013, 10:14 AM  Blanchard Kelch PT [phone]

## 2013-02-18 NOTE — Progress Notes (Addendum)
1 Day Post-Op Lap colostomy reversal and SBR Subjective: Pt doing well.  Min abd pain.  Mainly c/o back pain.  UOP marginal but adequate.  Vitals stable.  Objective: Vital signs in last 24 hours: Temp:  [98 F (36.7 C)-99.4 F (37.4 C)] 98.2 F (36.8 C) (08/15 0605) Pulse Rate:  [66-90] 88 (08/15 0605) Resp:  [11-23] 11 (08/15 0605) BP: (124-163)/(53-136) 136/72 mmHg (08/15 0605) SpO2:  [94 %-100 %] 95 % (08/15 0605) Weight:  [296 lb 8.3 oz (134.5 kg)] 296 lb 8.3 oz (134.5 kg) (08/14 1713)   Intake/Output from previous day: 08/14 0701 - 08/15 0700 In: 5010 [I.V.:5010] Out: 650 [Urine:450; Blood:200] Intake/Output this shift:     General appearance: alert and cooperative Resp: clear to auscultation bilaterally GI: normal findings: soft, non-tender and obese  Incision: healing well, no significant drainage  Lab Results:   Recent Labs  02/18/13 0411  WBC 16.1*  HGB 12.5  HCT 40.4  PLT 303   BMET  Recent Labs  02/18/13 0411  NA 135  K 4.6  CL 100  CO2 26  GLUCOSE 138*  BUN 16  CREATININE 0.90  CALCIUM 8.5   PT/INR No results found for this basename: LABPROT, INR,  in the last 72 hours ABG No results found for this basename: PHART, PCO2, PO2, HCO3,  in the last 72 hours  MEDS, Scheduled . alvimopan  12 mg Oral BID  . ARIPiprazole  5 mg Oral QHS  . buPROPion  450 mg Oral q morning - 10a  . enoxaparin (LOVENOX) injection  40 mg Subcutaneous Q24H  . escitalopram  20 mg Oral q morning - 10a  . hydroxychloroquine  200 mg Oral BID  . insulin aspart  0-15 Units Subcutaneous Q4H  . loratadine  10 mg Oral Daily  . morphine   Intravenous Q4H  . pantoprazole  80 mg Oral Daily  . traZODone  300 mg Oral QHS    Studies/Results: No results found.  Assessment: s/p Procedure(s):  LAPAROSCOPIC COLOSTOMY REVERSAL; SMALL BOWEL RESECTION; LYSIS OF ADHESIONS X 3 HOURS Patient Active Problem List   Diagnosis Date Noted  . Elevated troponin 09/14/2012  . Anemia  09/13/2012  . Healthcare-associated pneumonia 04/28/2012  . Perforation of colon 04/08/2012  . Respiratory failure, post-operative 04/05/2012  . Severe sepsis(995.92) 04/05/2012  . OSA (obstructive sleep apnea) 03/24/2012  . Dehydration 02/23/2012  . ARF (acute renal failure) 02/23/2012  . Oral thrush 02/23/2012  . Hypoxia 02/23/2012  . Diabetes mellitus 02/23/2012  . Major depressive disorder, recurrent episode, severe, without mention of psychotic behavior 09/25/2011  . Hyperglycemia 01/21/2011  . GERD (gastroesophageal reflux disease) 01/21/2011  . Cellulitis 10/23/2010  . Hypertension 10/23/2010  . Fibromyalgia 10/23/2010  . Chronic pain 10/23/2010  . Urinary frequency 10/23/2010  . Other and unspecified hyperlipidemia 10/23/2010  . Obesity 10/23/2010  . Insomnia 10/23/2010  . Depression 10/23/2010    Doing well.  Seems to still be a little dry.  Difficult operation.    Plan: Ambulate TID Cont NPO until at least flatus.  Ok for sips of clears. Cont PCA until tolerating PO Cont foley until tomorrow Pack LLQ wound BID  LOS: 1 day     .Vanita Panda, MD [facility], [place] [phone]   02/18/2013 8:41 AM

## 2013-02-19 LAB — GLUCOSE, CAPILLARY: Glucose-Capillary: 149 mg/dL — ABNORMAL HIGH (ref 70–99)

## 2013-02-19 LAB — CBC
MCH: 26.7 pg (ref 26.0–34.0)
MCHC: 30.9 g/dL (ref 30.0–36.0)
Platelets: 239 10*3/uL (ref 150–400)
RDW: 16.1 % — ABNORMAL HIGH (ref 11.5–15.5)

## 2013-02-19 LAB — BASIC METABOLIC PANEL
BUN: 9 mg/dL (ref 6–23)
Calcium: 8.5 mg/dL (ref 8.4–10.5)
GFR calc non Af Amer: >90 mL/min (ref >90)
Glucose, Bld: 151 mg/dL — ABNORMAL HIGH (ref 70–99)

## 2013-02-19 NOTE — Progress Notes (Signed)
Patient ID: Jasmine Huff, female   DOB: 1955-12-29, 57 y.o.   MRN: [id number]  General Surgery - [facility] - Progress Note  POD# 2  Subjective: Patient up in chair.  Foley out this AM.  Small mucous BM with blood this AM.  Mild nausea.  Taking clear liquid diet.  Objective: Vital signs in last 24 hours: Temp:  [97.9 F (36.6 C)-98.8 F (37.1 C)] 98.7 F (37.1 C) (08/16 0612) Pulse Rate:  [62-92] 62 (08/16 0612) Resp:  [14-16] 16 (08/16 0749) BP: (115-141)/(59-84) 115/59 mmHg (08/16 0612) SpO2:  [92 %-97 %] 96 % (08/16 0749) Last BM Date: 02/17/13  Intake/Output from previous day: 08/15 0701 - 08/16 0700 In: 2871.7 [P.O.:480; I.V.:2391.7] Out: 1725 [Urine:1725]  Exam: HEENT - clear, not icteric Neck - soft Chest - clear bilaterally Cor - RRR, no murmur Abd - soft, obese; few BS present; dressing dry and intact Ext - no significant edema Neuro - grossly intact, no focal deficits  Lab Results:   Recent Labs  02/18/13 0411 02/19/13 0450  WBC 16.1* 11.2*  HGB 12.5 10.9*  HCT 40.4 35.3*  PLT 303 239     Recent Labs  02/18/13 0411 02/19/13 0450  NA 135 135  K 4.6 3.9  CL 100 100  CO2 26 28  GLUCOSE 138* 151*  BUN 16 9  CREATININE 0.90 0.76  CALCIUM 8.5 8.5    Studies/Results: No results found.  Assessment / Plan: 1.  Status post ostomy closure with small bowel resection  Clear liquid diet  Ambulate  IS use  Velora Heckler, MD, [facility] Office: [phone]  02/19/2013

## 2013-02-20 LAB — BASIC METABOLIC PANEL
CO2: 29 mEq/L (ref 19–32)
GFR calc non Af Amer: 80 mL/min — ABNORMAL LOW (ref >90)
Glucose, Bld: 140 mg/dL — ABNORMAL HIGH (ref 70–99)
Potassium: 4 mEq/L (ref 3.5–5.1)
Sodium: 136 mEq/L (ref 135–145)

## 2013-02-20 LAB — CBC
Hemoglobin: 10.8 g/dL — ABNORMAL LOW (ref 12.0–15.0)
MCH: 27 pg (ref 26.0–34.0)
MCV: 87.5 fL (ref 78.0–100.0)
RBC: 4 MIL/uL (ref 3.87–5.11)

## 2013-02-20 LAB — GLUCOSE, CAPILLARY: Glucose-Capillary: 126 mg/dL — ABNORMAL HIGH (ref 70–99)

## 2013-02-20 MED ORDER — INSULIN ASPART 100 UNIT/ML ~~LOC~~ SOLN
0.0000 [IU] | Freq: Three times a day (TID) | SUBCUTANEOUS | Status: DC
Start: 2013-02-21 — End: 2013-02-23
  Administered 2013-02-21 – 2013-02-22 (×3): 2 [IU] via SUBCUTANEOUS
  Administered 2013-02-22: 17:00:00 via SUBCUTANEOUS

## 2013-02-20 MED ORDER — OXYCODONE-ACETAMINOPHEN 5-325 MG PO TABS
1.0000 | ORAL_TABLET | ORAL | Status: DC | PRN
  Administered 2013-02-20 – 2013-02-23 (×14): 2 via ORAL
  Filled 2013-02-20 (×14): qty 2

## 2013-02-20 NOTE — Progress Notes (Signed)
Physical Therapy Treatment Patient Details Name: Jasmine Huff MRN: [id number] DOB: Aug 24, 1955 Today's Date: 02/20/2013 Time: 1005-1017 PT Time Calculation (min): 12 min  PT Assessment / Plan / Recommendation  History of Present Illness admitted 02/17/13 for labrascopic reversal of colostomy and small bowel rescetion.   PT Comments     Follow Up Recommendations  Home health PT     Does the patient have the potential to tolerate intense rehabilitation     Barriers to Discharge        Equipment Recommendations  None recommended by PT    Recommendations for Other Services OT consult  Frequency Min 3X/week   Progress towards PT Goals Progress towards PT goals: Progressing toward goals  Plan Current plan remains appropriate    Precautions / Restrictions Precautions Precautions: Fall Precaution Comments: abd. wound. Restrictions Weight Bearing Restrictions: No   Pertinent Vitals/Pain Pt reports increased low back discomfort unassociated with surgery is limiting mobility    Mobility  Bed Mobility Bed Mobility: Not assessed (OOB with nursing and prefers return to chair after walking) Transfers Transfers: Sit to Stand;Stand to Sit Sit to Stand: 4: Min guard;From chair/3-in-1;With armrests Stand to Sit: 4: Min guard;To chair/3-in-1;With armrests Details for Transfer Assistance: cues for use of UEs on armrests Ambulation/Gait Ambulation/Gait Assistance: 4: Min assist;4: Min guard Ambulation Distance (Feet): 147 Feet Assistive device: Rolling walker Ambulation/Gait Assistance Details: cues to slow pace, for posture and for position from RW Gait Pattern: Step-through pattern;Trunk flexed Gait velocity: cues to slow pace General Gait Details: ltd by back discomfort - pt reports this issue as premorbid Stairs: No    Exercises     PT Diagnosis:    PT Problem List:   PT Treatment Interventions:     PT Goals (current goals can now be found in the care plan section) Acute  Rehab PT Goals Patient Stated Goal: I want to get up.I want to go home. PT Goal Formulation: With patient Time For Goal Achievement: 03/04/13 Potential to Achieve Goals: Good  Visit Information  Last PT Received On: 02/20/13 Assistance Needed: +1 History of Present Illness: admitted 02/17/13 for labrascopic reversal of colostomy and small bowel rescetion.    Subjective Data  Subjective: I've walked a little but hanging onto the IV pole Patient Stated Goal: I want to get up.I want to go home.   Cognition  Cognition Arousal/Alertness: Awake/alert Behavior During Therapy: WFL for tasks assessed/performed Overall Cognitive Status: Within Functional Limits for tasks assessed    Balance     End of Session     GP     Marsela Kuan 02/20/2013, 10:40 AM

## 2013-02-20 NOTE — Progress Notes (Signed)
Patient ID: Jasmine Huff, female   DOB: 12/15/1955, 57 y.o.   MRN: [id number]  General Surgery - [facility] - Progress Note  POD# 3  Subjective: Patient in good spirits.  No nausea or emesis.  I&O cath once, now voiding without difficulty.  Ambulatory.  Tolerating clear liquids - wants "pudding".  Notes small mucous BM with blood.  Objective: Vital signs in last 24 hours: Temp:  [97.5 F (36.4 C)-98.5 F (36.9 C)] 97.6 F (36.4 C) (08/17 0510) Pulse Rate:  [72-86] 77 (08/17 0510) Resp:  [13-21] 13 (08/17 0510) BP: (107-128)/(52-74) 128/68 mmHg (08/17 0510) SpO2:  [90 %-95 %] 92 % (08/17 0510) Last BM Date: 02/17/13  Intake/Output from previous day: 08/16 0701 - 08/17 0700 In: 2520 [P.O.:120; I.V.:2400] Out: 1701 [Urine:1700; Stool:1]  Exam: HEENT - clear, not icteric Neck - soft Chest - clear bilaterally Cor - RRR, no murmur Abd - soft, BS present; dressing dry and intact Ext - no significant edema Neuro - grossly intact, no focal deficits  Lab Results:   Recent Labs  02/19/13 0450 02/20/13 0408  WBC 11.2* 9.1  HGB 10.9* 10.8*  HCT 35.3* 35.0*  PLT 239 268     Recent Labs  02/19/13 0450 02/20/13 0408  NA 135 136  K 3.9 4.0  CL 100 99  CO2 28 29  GLUCOSE 151* 140*  BUN 9 6  CREATININE 0.76 0.81  CALCIUM 8.5 8.7    Studies/Results: No results found.  Assessment / Plan: 1.  Status post ostomy closure, small bowel resection  Advance to full liquid diet  Discontinue PCA  Percocet for pain  Ambulate in halls  Velora Heckler, MD, [facility] Office: [phone]  02/20/2013

## 2013-02-20 NOTE — Progress Notes (Signed)
Pt is having trouble urinating and feels pressure in bladder. Bladder scan showed . MD notified. Order given to in and out cath. In and out cath done and was removed. Will continue to monitor pts output. Patsey Berthold

## 2013-02-21 LAB — GLUCOSE, CAPILLARY
Glucose-Capillary: 134 mg/dL — ABNORMAL HIGH (ref 70–99)
Glucose-Capillary: 111 mg/dL — ABNORMAL HIGH (ref 70–99)
Glucose-Capillary: 136 mg/dL — ABNORMAL HIGH (ref 70–99)

## 2013-02-21 NOTE — Progress Notes (Signed)
Physical Therapy Treatment Patient Details Name: Jasmine Huff MRN: [id number] DOB: December 06, 1955 Today's Date: 02/21/2013 Time: 2952-8413 PT Time Calculation (min): 9 min  PT Assessment / Plan / Recommendation  History of Present Illness admitted 02/17/13 for labrascopic reversal of colostomy and small bowel rescetion.   PT Comments   Pt amb self in room to and from BR holding to IV pole.  Amb pt in hallway limited distance due to dyspnea and increased c/o back pain.    Follow Up Recommendations  Home health PT     Does the patient have the potential to tolerate intense rehabilitation     Barriers to Discharge        Equipment Recommendations  None recommended by PT    Recommendations for Other Services    Frequency Min 3X/week   Progress towards PT Goals Progress towards PT goals: Progressing toward goals  Plan      Precautions / Restrictions   none  Pertinent Vitals/Pain C/o back pain    Mobility  Bed Mobility Bed Mobility: Supine to Sit Supine to Sit: 6: Modified independent (Device/Increase time) Details for Bed Mobility Assistance: [facility] Transfers Transfers: Sit to Stand;Stand to Sit Sit to Stand: 6: Modified independent (Device/Increase time);From bed Stand to Sit: 6: Modified independent (Device/Increase time);To chair/3-in-1 Details for Transfer Assistance: pt prefers to weight shift forward then stand up Ambulation/Gait Ambulation/Gait Assistance: 5: Supervision;4: Min guard Ambulation Distance (Feet): 152 Feet Assistive device: Rolling walker Ambulation/Gait Assistance Details: pt held to IV pole this time vs use of RW.  Pt c/o "bad back" and stated "this is a far as I can go" as she proceeded to turn around Gait Pattern: Step-through pattern;Trunk flexed Gait velocity: cues to slow pace     PT Goals (current goals can now be found in the care plan section)    Visit Information  Last PT Received On: 02/21/13 Assistance Needed: +1 History of Present  Illness: admitted 02/17/13 for labrascopic reversal of colostomy and small bowel rescetion.    Subjective Data      Cognition       Balance     End of Session PT - End of Session Activity Tolerance: Patient tolerated treatment well Patient left: in chair;with call bell/phone within reach Nurse Communication: Mobility status   Felecia Shelling  PTA [facility]  Acute  Rehab Pager      [phone]

## 2013-02-21 NOTE — Progress Notes (Signed)
OT Cancellation Note  Patient Details Name: Jasmine Huff MRN: [id number] DOB: September 27, 1955   Cancelled Treatment:    Reason Eval/Treat Not Completed: Other (comment)   Pt has just finished using bathroom and does not want to perform any adls at this time.  Will check back another day.     [name] 02/21/2013, 8:27 AM Marica Otter, OTR/L [phone] 02/21/2013

## 2013-02-21 NOTE — Progress Notes (Signed)
4 Days Post-Op Lap colostomy reversal and SBR Subjective: Pt doing well.  Min abd pain. C/O sensation of incomplete bladder emptying.  Vitals stable.  Objective: Vital signs in last 24 hours: Temp:  [97.4 F (36.3 C)-98.4 F (36.9 C)] 98.4 F (36.9 C) (08/18 0612) Pulse Rate:  [75-86] 86 (08/18 0612) Resp:  [18] 18 (08/18 0612) BP: (109-131)/(68-73) 131/73 mmHg (08/18 0612) SpO2:  [92 %-95 %] 95 % (08/18 0612)   Intake/Output from previous day: 08/17 0701 - 08/18 0700 In: 1317.5 [I.V.:1317.5] Out: 550 [Urine:550] Intake/Output this shift:   General appearance: alert and cooperative Resp: clear to auscultation bilaterally GI: normal findings: soft, non-tender and obese  Incision: healing well, no significant drainage Ostomy site packed, granulation tissue noted at base  Lab Results:   Recent Labs  02/19/13 0450 02/20/13 0408  WBC 11.2* 9.1  HGB 10.9* 10.8*  HCT 35.3* 35.0*  PLT 239 268   BMET  Recent Labs  02/19/13 0450 02/20/13 0408  NA 135 136  K 3.9 4.0  CL 100 99  CO2 28 29  GLUCOSE 151* 140*  BUN 9 6  CREATININE 0.76 0.81  CALCIUM 8.5 8.7   PT/INR No results found for this basename: LABPROT, INR,  in the last 72 hours ABG No results found for this basename: PHART, PCO2, PO2, HCO3,  in the last 72 hours  MEDS, Scheduled . ARIPiprazole  5 mg Oral QHS  . buPROPion  450 mg Oral q morning - 10a  . enoxaparin (LOVENOX) injection  40 mg Subcutaneous Q24H  . escitalopram  20 mg Oral q morning - 10a  . hydroxychloroquine  200 mg Oral BID  . insulin aspart  0-15 Units Subcutaneous TID WC  . loratadine  10 mg Oral Daily  . pantoprazole  80 mg Oral Daily  . traZODone  300 mg Oral QHS    Studies/Results: No results found.  Assessment: s/p Procedure(s):  LAPAROSCOPIC COLOSTOMY REVERSAL; SMALL BOWEL RESECTION; LYSIS OF ADHESIONS X 3 HOURS Patient Active Problem List   Diagnosis Date Noted  . Elevated troponin 09/14/2012  . Anemia 09/13/2012  .  Healthcare-associated pneumonia 04/28/2012  . Perforation of colon 04/08/2012  . Respiratory failure, post-operative 04/05/2012  . Severe sepsis(995.92) 04/05/2012  . OSA (obstructive sleep apnea) 03/24/2012  . Dehydration 02/23/2012  . ARF (acute renal failure) 02/23/2012  . Oral thrush 02/23/2012  . Hypoxia 02/23/2012  . Diabetes mellitus 02/23/2012  . Major depressive disorder, recurrent episode, severe, without mention of psychotic behavior 09/25/2011  . Hyperglycemia 01/21/2011  . GERD (gastroesophageal reflux disease) 01/21/2011  . Cellulitis 10/23/2010  . Hypertension 10/23/2010  . Fibromyalgia 10/23/2010  . Chronic pain 10/23/2010  . Urinary frequency 10/23/2010  . Other and unspecified hyperlipidemia 10/23/2010  . Obesity 10/23/2010  . Insomnia 10/23/2010  . Depression 10/23/2010    Doing well.   Plan: Ambulate TID Having BM's.  Will increase diet to bland reg diet Cont percocet for pain Bladder scan to estimate post void residual Pack LLQ wound BID  LOS: 4 days     .Vanita Panda, MD [facility], [place] [phone]   02/21/2013 8:40 AM

## 2013-02-21 NOTE — Care Management Note (Signed)
    Page 1 of 1   02/21/2013     2:12:21 PM   CARE MANAGEMENT NOTE 02/21/2013  Patient:  Jasmine Huff, Jasmine Huff   Account Number:  [id number]  Date Initiated:  02/18/2013  Documentation initiated by:  Lorenda Ishihara  Subjective/Objective Assessment:   57 yo female admitted s/p colostomy reversal, SBR, lysis of adhesions. PTA lived at home alone.     Action/Plan:   Home when stable   Anticipated DC Date:  02/23/2013   Anticipated DC Plan:  HOME W HOME HEALTH SERVICES      DC Planning Services  CM consult      [facility] Choice  HOME HEALTH   Choice offered to / List presented to:  C-1 Patient        HH arranged  HH-2 PT  HH-1 RN      [facility] agency  [facility]   Status of service:  Completed, signed off Medicare Important Message given?   (If response is "NO", the following Medicare IM given date fields will be blank) Date Medicare IM given:   Date Additional Medicare IM given:    Discharge Disposition:  HOME W HOME HEALTH SERVICES  Per UR Regulation:  Reviewed for med. necessity/level of care/duration of stay  If discussed at Long Length of Stay Meetings, dates discussed:    Comments:  02-21-13 Lorenda Ishihara RN CM 1400 Spoke with patient at bedside regarding HH needs. Patient has used [facility] in the past and would like to use them again. Contacted Kristen with [facility] to arrange, she will f/u with patient.

## 2013-02-22 ENCOUNTER — Encounter (HOSPITAL_COMMUNITY): Payer: Self-pay | Admitting: *Deleted

## 2013-02-22 LAB — GLUCOSE, CAPILLARY
Glucose-Capillary: 133 mg/dL — ABNORMAL HIGH (ref 70–99)
Glucose-Capillary: 154 mg/dL — ABNORMAL HIGH (ref 70–99)

## 2013-02-22 MED ORDER — ONDANSETRON HCL 4 MG/2ML IJ SOLN: 4.0000 mg | INTRAMUSCULAR | Status: DC

## 2013-02-22 MED ORDER — ONDANSETRON HCL 4 MG/2ML IJ SOLN
4.0000 mg | INTRAMUSCULAR | Status: DC | PRN
  Administered 2013-02-22: 4 mg via INTRAVENOUS
  Filled 2013-02-22: qty 2

## 2013-02-22 NOTE — Progress Notes (Signed)
Occupational Therapy Treatment Patient Details Name: Danica Camarena MRN: [id number] DOB: 06-03-1956 Today's Date: 02/22/2013 Time: 7829-5621 OT Time Calculation (min): 11 min  OT Assessment / Plan / Recommendation  History of present illness admitted 02/17/13 for labrascopic reversal of colostomy and small bowel resection.   OT comments    Follow Up Recommendations  Home health OT;Supervision - Intermittent    Barriers to Discharge       Equipment Recommendations  None recommended by OT    Recommendations for Other Services    Frequency     Progress towards OT Goals Progress towards OT goals: Progressing toward goals  Plan      Precautions / Restrictions Precautions Precautions: Fall Restrictions Weight Bearing Restrictions: No   Pertinent Vitals/Pain No pain reported    ADL  Toileting - Clothing Manipulation and Hygiene: Modified independent (toilet aid) Transfers/Ambulation Related to ADLs: mod i to get oob ADL Comments: pt having difficulty with hygiene.  simulated with toilet aid.  This worked for her and I picked this up for her in the gift shop.   Pt feels she will be able to gert clothes on now; NT    OT Diagnosis:    OT Problem List:   OT Treatment Interventions:     OT Goals(current goals can now be found in the care plan section)    Visit Information  Last OT Received On: 02/22/13 Assistance Needed: +1 History of Present Illness: admitted 02/17/13 for labrascopic reversal of colostomy and small bowel rescetion.    Subjective Data      Prior Functioning       Cognition  Cognition Arousal/Alertness: Awake/alert Behavior During Therapy: WFL for tasks assessed/performed Overall Cognitive Status: Within Functional Limits for tasks assessed    Mobility  Bed Mobility Supine to Sit: 6: Modified independent (Device/Increase time) Transfers Sit to Stand: 6: Modified independent (Device/Increase time);From bed    Exercises      Balance     End of  Session OT - End of Session Activity Tolerance: Patient tolerated treatment well Patient left: in bed;with call bell/phone within reach  GO     [facility] 02/22/2013, 11:53 AM Marica Otter, OTR/L [phone] 02/22/2013

## 2013-02-22 NOTE — Progress Notes (Signed)
5 Days Post-Op Lap colostomy reversal and SBR Subjective: Pt doing well.  Having loose BM's.  Min abd pain. Still c/o sensation of incomplete bladder emptying, difficulty initiating urination, but slightly better.  Vitals stable.  Objective: Vital signs in last 24 hours: Temp:  [98.2 F (36.8 C)-99.1 F (37.3 C)] 98.9 F (37.2 C) (08/19 0457) Pulse Rate:  [85-93] 86 (08/19 0457) Resp:  [16-18] 18 (08/19 0457) BP: (143-149)/(74-84) 149/84 mmHg (08/19 0457) SpO2:  [92 %-94 %] 94 % (08/19 0457)   Intake/Output from previous day: 08/18 0701 - 08/19 0700 In: 1159.5 [P.O.:600; I.V.:559.5] Out: 650 [Urine:650] Intake/Output this shift:   General appearance: alert and cooperative Resp: clear to auscultation bilaterally GI: normal findings: soft, non-tender and obese  Incision: healing well, no significant drainage Ostomy site packed, granulation tissue noted at base  Lab Results:   Recent Labs  02/20/13 0408  WBC 9.1  HGB 10.8*  HCT 35.0*  PLT 268   BMET  Recent Labs  02/20/13 0408  NA 136  K 4.0  CL 99  CO2 29  GLUCOSE 140*  BUN 6  CREATININE 0.81  CALCIUM 8.7   PT/INR No results found for this basename: LABPROT, INR,  in the last 72 hours ABG No results found for this basename: PHART, PCO2, PO2, HCO3,  in the last 72 hours  MEDS, Scheduled . ARIPiprazole  5 mg Oral QHS  . buPROPion  450 mg Oral q morning - 10a  . enoxaparin (LOVENOX) injection  40 mg Subcutaneous Q24H  . escitalopram  20 mg Oral q morning - 10a  . hydroxychloroquine  200 mg Oral BID  . insulin aspart  0-15 Units Subcutaneous TID WC  . loratadine  10 mg Oral Daily  . pantoprazole  80 mg Oral Daily  . traZODone  300 mg Oral QHS    Studies/Results: No results found.  Assessment: s/p Procedure(s):  LAPAROSCOPIC COLOSTOMY REVERSAL; SMALL BOWEL RESECTION; LYSIS OF ADHESIONS X 3 HOURS Patient Active Problem List   Diagnosis Date Noted  . Elevated troponin 09/14/2012  . Anemia  09/13/2012  . Healthcare-associated pneumonia 04/28/2012  . Perforation of colon 04/08/2012  . Respiratory failure, post-operative 04/05/2012  . Severe sepsis(995.92) 04/05/2012  . OSA (obstructive sleep apnea) 03/24/2012  . Dehydration 02/23/2012  . ARF (acute renal failure) 02/23/2012  . Oral thrush 02/23/2012  . Hypoxia 02/23/2012  . Diabetes mellitus 02/23/2012  . Major depressive disorder, recurrent episode, severe, without mention of psychotic behavior 09/25/2011  . Hyperglycemia 01/21/2011  . GERD (gastroesophageal reflux disease) 01/21/2011  . Cellulitis 10/23/2010  . Hypertension 10/23/2010  . Fibromyalgia 10/23/2010  . Chronic pain 10/23/2010  . Urinary frequency 10/23/2010  . Other and unspecified hyperlipidemia 10/23/2010  . Obesity 10/23/2010  . Insomnia 10/23/2010  . Depression 10/23/2010    Doing well.   Plan: Ambulate TID Having BM's, cont reg diet Cont percocet for pain Pack LLQ wound BID Anticipate d/c tomorrow   LOS: 5 days     .Vanita Panda, MD [facility], [place] [phone]   02/22/2013 7:50 AM

## 2013-02-22 NOTE — Progress Notes (Signed)
Pt c/o of nausea. Called md on call awaiting call back.

## 2013-02-23 LAB — GLUCOSE, CAPILLARY: Glucose-Capillary: 131 mg/dL — ABNORMAL HIGH (ref 70–99)

## 2013-02-23 MED ORDER — OXYCODONE-ACETAMINOPHEN 10-325 MG PO TABS: 1.0000 | ORAL_TABLET | Freq: Three times a day (TID) | ORAL | Status: DC | PRN

## 2013-02-23 MED ORDER — POLYETHYLENE GLYCOL 3350 17 G PO PACK: 17.0000 g | PACK | Freq: Every day | ORAL | Status: DC | PRN

## 2013-02-23 NOTE — Discharge Summary (Signed)
Physician Discharge Summary  Patient ID: Jasmine Huff MRN: [id number] DOB/AGE: [birth year] 57 y.o.  Admit date: 02/17/2013 Discharge date: 02/23/2013  Admission Diagnoses: Hartman's colostomy  Discharge Diagnoses: colostomy reversed   Discharged Condition: good  Hospital Course: The patient was admitted after reversal of Hartman's procedure.  She began to ambulate on POD 1.  Her foley was removed on POD 2.  Her diet was advanced slowly after the return of bowel function.  She had some difficulty with initiating urination after the foley was removed, but this was getting better slowly.  By POD 6, she was ambulating well, her pain was controlled and she was tolerating a diet.    Consults: None  Significant Diagnostic Studies: labs: cbc, chem  Treatments: IV hydration, analgesia: acetaminophen w/ codeine and surgery: lap ostomy reversal  Discharge Exam: Blood pressure 144/82, pulse 88, temperature 98.1 F (36.7 C), temperature source Oral, resp. rate 16, height 5\' 3"  (1.6 m), weight 296 lb 8.3 oz (134.5 kg), SpO2 94.00%. General appearance: alert and cooperative GI: normal findings: soft, non-tender Incision/Wound: clean, dry, ostomy site packed  Disposition: 01-Home or Self Care with home health and home PT     Medication List         ALPRAZolam 1 MG tablet  Commonly known as:  XANAX  Take 1 mg by mouth 3 (three) times daily as needed for anxiety.     ARIPiprazole 5 MG tablet  Commonly known as:  ABILIFY  Take 5 mg by mouth at bedtime.     aspirin EC 81 MG tablet  Take 81 mg by mouth daily.     buPROPion 150 MG 24 hr tablet  Commonly known as:  WELLBUTRIN XL  Take 450 mg by mouth every morning.     cetirizine 10 MG tablet  Commonly known as:  ZYRTEC  Take 10 mg by mouth every evening.     escitalopram 20 MG tablet  Commonly known as:  LEXAPRO  Take 20 mg by mouth every morning.     hydroxychloroquine 200 MG tablet  Commonly known as:  PLAQUENIL  Take 200 mg by  mouth 2 (two) times daily.     Iron 240 (27 FE) MG Tabs  Take 1 tablet by mouth 2 (two) times daily.     multivitamin with minerals Tabs tablet  Take 1 tablet by mouth daily.     omeprazole 40 MG capsule  Commonly known as:  PRILOSEC  Take 40 mg by mouth every evening.     ondansetron 4 MG tablet  Commonly known as:  ZOFRAN  Take 4 mg by mouth every 8 (eight) hours as needed. For nausea.     oxyCODONE-acetaminophen 10-325 MG per tablet  Commonly known as:  PERCOCET  Take 1 tablet by mouth 3 (three) times daily as needed for pain.     polyethylene glycol packet  Commonly known as:  MIRALAX / GLYCOLAX  Take 17 g by mouth daily as needed.     traZODone 100 MG tablet  Commonly known as:  DESYREL  Take 300 mg by mouth at bedtime.           Follow-up Information   Follow up with Vanita Panda., MD. Schedule an appointment as soon as possible for a visit in 2 weeks.   Specialty:  General Surgery   Contact information:   [address] [place] [place] [address] [phone]       Signed: Vanita Panda 02/23/2013, 9:17 AM

## 2013-02-24 DIAGNOSIS — F411 Generalized anxiety disorder: Secondary | ICD-10-CM | POA: Diagnosis not present

## 2013-02-24 DIAGNOSIS — K219 Gastro-esophageal reflux disease without esophagitis: Secondary | ICD-10-CM | POA: Diagnosis not present

## 2013-02-24 DIAGNOSIS — Z8719 Personal history of other diseases of the digestive system: Secondary | ICD-10-CM | POA: Diagnosis not present

## 2013-02-24 DIAGNOSIS — G894 Chronic pain syndrome: Secondary | ICD-10-CM | POA: Diagnosis not present

## 2013-02-24 DIAGNOSIS — T8189XA Other complications of procedures, not elsewhere classified, initial encounter: Secondary | ICD-10-CM | POA: Diagnosis not present

## 2013-02-24 DIAGNOSIS — I10 Essential (primary) hypertension: Secondary | ICD-10-CM | POA: Diagnosis not present

## 2013-02-24 DIAGNOSIS — F329 Major depressive disorder, single episode, unspecified: Secondary | ICD-10-CM | POA: Diagnosis not present

## 2013-02-24 DIAGNOSIS — E669 Obesity, unspecified: Secondary | ICD-10-CM | POA: Diagnosis not present

## 2013-02-25 DIAGNOSIS — E669 Obesity, unspecified: Secondary | ICD-10-CM | POA: Diagnosis not present

## 2013-02-25 DIAGNOSIS — K219 Gastro-esophageal reflux disease without esophagitis: Secondary | ICD-10-CM | POA: Diagnosis not present

## 2013-02-25 DIAGNOSIS — F329 Major depressive disorder, single episode, unspecified: Secondary | ICD-10-CM | POA: Diagnosis not present

## 2013-02-25 DIAGNOSIS — T8189XA Other complications of procedures, not elsewhere classified, initial encounter: Secondary | ICD-10-CM | POA: Diagnosis not present

## 2013-02-25 DIAGNOSIS — G894 Chronic pain syndrome: Secondary | ICD-10-CM | POA: Diagnosis not present

## 2013-02-25 DIAGNOSIS — I10 Essential (primary) hypertension: Secondary | ICD-10-CM | POA: Diagnosis not present

## 2013-02-28 ENCOUNTER — Emergency Department (HOSPITAL_COMMUNITY)
Admission: EM | Admit: 2013-02-28 | Discharge: 2013-02-28 | Disposition: A | Discharge status: Home / Self Care | Payer: Medicare Other | Attending: Emergency Medicine | Admitting: Emergency Medicine

## 2013-02-28 ENCOUNTER — Telehealth (INDEPENDENT_AMBULATORY_CARE_PROVIDER_SITE_OTHER): Payer: Self-pay | Admitting: General Surgery

## 2013-02-28 ENCOUNTER — Encounter (HOSPITAL_COMMUNITY): Payer: Self-pay

## 2013-02-28 DIAGNOSIS — M129 Arthropathy, unspecified: Secondary | ICD-10-CM | POA: Insufficient documentation

## 2013-02-28 DIAGNOSIS — Z9981 Dependence on supplemental oxygen: Secondary | ICD-10-CM | POA: Insufficient documentation

## 2013-02-28 DIAGNOSIS — D649 Anemia, unspecified: Secondary | ICD-10-CM | POA: Diagnosis not present

## 2013-02-28 DIAGNOSIS — IMO0002 Reserved for concepts with insufficient information to code with codable children: Secondary | ICD-10-CM | POA: Diagnosis not present

## 2013-02-28 DIAGNOSIS — Z79899 Other long term (current) drug therapy: Secondary | ICD-10-CM | POA: Diagnosis not present

## 2013-02-28 DIAGNOSIS — Z933 Colostomy status: Secondary | ICD-10-CM | POA: Diagnosis not present

## 2013-02-28 DIAGNOSIS — K219 Gastro-esophageal reflux disease without esophagitis: Secondary | ICD-10-CM | POA: Insufficient documentation

## 2013-02-28 DIAGNOSIS — G894 Chronic pain syndrome: Secondary | ICD-10-CM | POA: Diagnosis not present

## 2013-02-28 DIAGNOSIS — G4730 Sleep apnea, unspecified: Secondary | ICD-10-CM | POA: Insufficient documentation

## 2013-02-28 DIAGNOSIS — F329 Major depressive disorder, single episode, unspecified: Secondary | ICD-10-CM | POA: Insufficient documentation

## 2013-02-28 DIAGNOSIS — Z8719 Personal history of other diseases of the digestive system: Secondary | ICD-10-CM | POA: Insufficient documentation

## 2013-02-28 DIAGNOSIS — Z90710 Acquired absence of both cervix and uterus: Secondary | ICD-10-CM | POA: Insufficient documentation

## 2013-02-28 DIAGNOSIS — Z7982 Long term (current) use of aspirin: Secondary | ICD-10-CM | POA: Diagnosis not present

## 2013-02-28 DIAGNOSIS — T819XXA Unspecified complication of procedure, initial encounter: Secondary | ICD-10-CM

## 2013-02-28 DIAGNOSIS — IMO0001 Reserved for inherently not codable concepts without codable children: Secondary | ICD-10-CM | POA: Diagnosis not present

## 2013-02-28 DIAGNOSIS — Y838 Other surgical procedures as the cause of abnormal reaction of the patient, or of later complication, without mention of misadventure at the time of the procedure: Secondary | ICD-10-CM | POA: Insufficient documentation

## 2013-02-28 DIAGNOSIS — F411 Generalized anxiety disorder: Secondary | ICD-10-CM | POA: Insufficient documentation

## 2013-02-28 DIAGNOSIS — Z8679 Personal history of other diseases of the circulatory system: Secondary | ICD-10-CM | POA: Diagnosis not present

## 2013-02-28 DIAGNOSIS — R109 Unspecified abdominal pain: Secondary | ICD-10-CM | POA: Diagnosis not present

## 2013-02-28 DIAGNOSIS — F3289 Other specified depressive episodes: Secondary | ICD-10-CM | POA: Insufficient documentation

## 2013-02-28 DIAGNOSIS — R630 Anorexia: Secondary | ICD-10-CM | POA: Diagnosis not present

## 2013-02-28 DIAGNOSIS — Z8639 Personal history of other endocrine, nutritional and metabolic disease: Secondary | ICD-10-CM | POA: Insufficient documentation

## 2013-02-28 DIAGNOSIS — G43909 Migraine, unspecified, not intractable, without status migrainosus: Secondary | ICD-10-CM | POA: Insufficient documentation

## 2013-02-28 DIAGNOSIS — Z862 Personal history of diseases of the blood and blood-forming organs and certain disorders involving the immune mechanism: Secondary | ICD-10-CM | POA: Insufficient documentation

## 2013-02-28 DIAGNOSIS — Z87448 Personal history of other diseases of urinary system: Secondary | ICD-10-CM | POA: Diagnosis not present

## 2013-02-28 LAB — CBC WITH DIFFERENTIAL/PLATELET
Basophils Absolute: 0 10*3/uL (ref 0.0–0.1)
Basophils Relative: 0 % (ref 0–1)
Eosinophils Absolute: 0.2 10*3/uL (ref 0.0–0.7)
Eosinophils Relative: 2 % (ref 0–5)
HCT: 33.9 % — ABNORMAL LOW (ref 36.0–46.0)
Hemoglobin: 10.6 g/dL — ABNORMAL LOW (ref 12.0–15.0)
Lymphocytes Relative: 17 % (ref 12–46)
Lymphs Abs: 1.6 10*3/uL (ref 0.7–4.0)
MCH: 26.8 pg (ref 26.0–34.0)
MCHC: 31.3 g/dL (ref 30.0–36.0)
MCV: 85.6 fL (ref 78.0–100.0)
Monocytes Absolute: 1.1 10*3/uL — ABNORMAL HIGH (ref 0.1–1.0)
Monocytes Relative: 11 % (ref 3–12)
Neutro Abs: 6.7 10*3/uL (ref 1.7–7.7)
Neutrophils Relative %: 70 % (ref 43–77)
Platelets: 438 10*3/uL — ABNORMAL HIGH (ref 150–400)
RBC: 3.96 MIL/uL (ref 3.87–5.11)
RDW: 15.5 % (ref 11.5–15.5)
WBC: 9.6 10*3/uL (ref 4.0–10.5)

## 2013-02-28 LAB — BASIC METABOLIC PANEL
BUN: 7 mg/dL (ref 6–23)
CO2: 33 mEq/L — ABNORMAL HIGH (ref 19–32)
Calcium: 9 mg/dL (ref 8.4–10.5)
Chloride: 100 mEq/L (ref 96–112)
Creatinine, Ser: 0.82 mg/dL (ref 0.50–1.10)
GFR calc Af Amer: >90 mL/min (ref >90)
GFR calc non Af Amer: 79 mL/min — ABNORMAL LOW (ref >90)
Glucose, Bld: 100 mg/dL — ABNORMAL HIGH (ref 70–99)
Potassium: 3.7 mEq/L (ref 3.5–5.1)
Sodium: 140 mEq/L (ref 135–145)

## 2013-02-28 MED ORDER — MORPHINE SULFATE 4 MG/ML IJ SOLN
4.0000 mg | Freq: Once | INTRAMUSCULAR | Status: AC
  Administered 2013-02-28: 4 mg via INTRAVENOUS
  Filled 2013-02-28: qty 1

## 2013-02-28 NOTE — Progress Notes (Signed)
Jasmine Huff is a 57 y.o. obese female that is ~2 weeks s/p colostomy reversal.  She developed bloody drainage from her ostomy overnight, and was directed to the ED this AM when she called our office.  Vitals stable.  Hgb stable.  WBC normal.  She is tolerating fluids but doesn't have much appetite.  She denies fevers.  She has been more active over the past few days.  Exam:  Filed Vitals:   02/28/13 1132  BP: 146/59  Pulse: 85  Temp: 99 F (37.2 C)  Resp: 20  NAD Ostomy site with good granulation tissue.  Some thick blood tinged drainage noted. Abd: soft  Appears to have drained an underlying fluid collection.  Recommended dry packing for ~24h and change when wet.  Then ok to switch back to wet to dry.  F/U with me in 2 weeks.  Vanita Panda, MD  Colorectal and General Surgery [facility]

## 2013-02-28 NOTE — Telephone Encounter (Signed)
Pt called to report her surgical wound has begun to bleed.  It is a regular and continuous flow of blood.  Pt directed to go to [facility] ER now and page sent to Dr. Maisie Fus to let her know pt is coming in for this.  Pt was reluctant to return to the hospital, but agreed to go in.

## 2013-02-28 NOTE — ED Notes (Signed)
Patient reports that she had a colostomy reversal and was discharged from the hospital 4 days ago. Patient states at 0300 today she noticed blood coming from her wound site. Drainage is thiuck and pink in color.

## 2013-02-28 NOTE — ED Provider Notes (Signed)
Medical screening examination/treatment/procedure(s) were performed by non-physician practitioner and as supervising physician I was immediately available for consultation/collaboration.   Gilda Crease, MD 02/28/13 [phone]

## 2013-02-28 NOTE — ED Provider Notes (Signed)
CSN: [id number]     Arrival date & time 02/28/13  1045 History     First MD Initiated Contact with Patient 02/28/13 1120     Chief Complaint  Patient presents with  . wound site pain   . wound bleeding    (Consider location/radiation/quality/duration/timing/severity/associated sxs/prior Treatment) HPI  57 YO F presents s/p colostomy reversal and SBR 10 days ago. Pt reports going to the grocery store and increased activity yesterday. She then noticed large amounts of bloody drainage coming from her abdominal incision last night. She reports the drainage pooling in her underwear. She dressed the wound with guaze to try to contain the drainage. Pt called her surgeon Dr. Maisie Fus office this morning and they told her to go to ED to have it looked at. Pt also reports abdominal pain, decreased appetite. Denies fever, chills, nausea, vomiting, stool changes, shortness of breath, heart palpitations.  Past Medical History  Diagnosis Date  . Depression   . Allergy   . Chronic pain syndrome   . GERD (gastroesophageal reflux disease) 01/21/2011  . Anxiety   . Panic attacks   . Knee pain   . Urination frequency   . Perforation of colon 04/08/2012  . Anemia   . History of high blood pressure no meds for a few years  . Sleep apnea     CPAP  . History of diabetes mellitus     no mrds x few years, pt does not check blood sugar at home  . Migraine   . Fibromyalgia   . H/O hiatal hernia   . Arthritis     ra   Past Surgical History  Procedure Laterality Date  . Abdominal hysterectomy    . Right ovary removal    . Left hemicolectomy with splenic flexure mobilization and end colostomy  04/04/2012  . Laparotomy  04/04/2012    Procedure: EXPLORATORY LAPAROTOMY;  Surgeon: Romie Levee, MD;  Location: [facility];  Service: General;  Laterality: N/A;  left colectomy  . Colostomy  04/04/2012    Procedure: COLOSTOMY;  Surgeon: Romie Levee, MD;  Location: [facility];  Service: General;  Laterality: N/A;  .  Colonoscopy N/A 11/12/2012    Procedure: COLONOSCOPY;  Surgeon: Romie Levee, MD;  Location: [facility];  Service: Endoscopy;  Laterality: N/A;  . Colon surgery  Oct 2013    perf bowel w/ colostomy  . Cholecystectomy  10 yrs ago  . Colostomy reversal N/A 02/17/2013    Procedure:  LAPAROSCOPIC COLOSTOMY REVERSAL; SMALL BOWEL RESECTION; LYSIS OF ADHESIONS ;  Surgeon: Romie Levee, MD;  Location: [facility];  Service: General;  Laterality: N/A;   Family History  Problem Relation Age of Onset  . Depression Mother   . Depression Father   . Alcohol abuse Brother   . Depression Brother   . Depression Maternal Grandfather   . Depression Other    History  Substance Use Topics  . Smoking status: Never Smoker   . Smokeless tobacco: Never Used  . Alcohol Use: No   OB History   Grav Para Term Preterm Abortions TAB SAB Ect Mult Living                 Review of Systems .All other systems negative except as documented in the HPI. All pertinent positives and negatives as reviewed in the HPI.  Allergies  Pregabalin; Saphris; Iodinated diagnostic agents; and Seroquel  Home Medications   Current Outpatient Rx  Name  Route  Sig  Dispense  Refill  .  ALPRAZolam (XANAX) 1 MG tablet   Oral   Take 1 mg by mouth 3 (three) times daily as needed for anxiety.          . ARIPiprazole (ABILIFY) 5 MG tablet   Oral   Take 5 mg by mouth at bedtime.         Marland Kitchen buPROPion (WELLBUTRIN XL) 150 MG 24 hr tablet   Oral   Take 450 mg by mouth every morning.         . cetirizine (ZYRTEC) 10 MG tablet   Oral   Take 10 mg by mouth every evening.          . escitalopram (LEXAPRO) 20 MG tablet   Oral   Take 20 mg by mouth every morning.          . Ferrous Gluconate (IRON) 240 (27 FE) MG TABS   Oral   Take 1 tablet by mouth 2 (two) times daily.          . hydroxychloroquine (PLAQUENIL) 200 MG tablet   Oral   Take 200 mg by mouth 2 (two) times daily.          . Multiple Vitamin (MULTIVITAMIN  WITH MINERALS) TABS   Oral   Take 1 tablet by mouth daily.         Marland Kitchen omeprazole (PRILOSEC) 40 MG capsule   Oral   Take 40 mg by mouth every evening.          . ondansetron (ZOFRAN) 4 MG tablet   Oral   Take 4 mg by mouth every 8 (eight) hours as needed for nausea. For nausea.         Marland Kitchen oxyCODONE-acetaminophen (PERCOCET) 10-325 MG per tablet   Oral   Take 1 tablet by mouth 3 (three) times daily as needed for pain.   30 tablet   0   . traZODone (DESYREL) 100 MG tablet   Oral   Take 300 mg by mouth at bedtime.          Marland Kitchen aspirin EC 81 MG tablet   Oral   Take 81 mg by mouth daily.         . polyethylene glycol (MIRALAX / GLYCOLAX) packet   Oral   Take 17 g by mouth daily as needed (constipation).          BP 146/59  Pulse 85  Temp(Src) 99 F (37.2 C) (Oral)  Resp 20  Ht 5\' 3"  (1.6 m)  Wt 295 lb (133.811 kg)  BMI 52.27 kg/m2  SpO2 95% Physical Exam  Constitutional: She appears well-developed and well-nourished. She is cooperative.  HENT:  Head: Normocephalic and atraumatic.  Cardiovascular: Normal rate, regular rhythm, S1 normal, S2 normal and normal heart sounds.   Pulmonary/Chest: Effort normal and breath sounds normal. Not tachypneic. No respiratory distress.  Abdominal: Soft. Bowel sounds are normal. She exhibits no distension. There is tenderness in the periumbilical area and left upper quadrant.    1.5x2in round open wound with bloody, pinkish, thick, mucoid drainage. Tenderness to palpation and induration surrounding the wound.  Also a superficial ulceration about 1x1cm in medial surgical scar of lower abdomen.  Neurological: She is alert.  Skin: Abrasion and lesion noted. No erythema.  Psychiatric: She has a normal mood and affect. Her behavior is normal. Judgment and thought content normal.    ED Course   Procedures (including critical care time)  Labs Reviewed  CBC WITH DIFFERENTIAL  BASIC METABOLIC PANEL  Dr. Maisie Fus came and evaluated  the patient and advised she can go home and followup in her office.  Patient is stable during her emergency room visit.  Dressing was changed and Dr. Maisie Fus.  Instructed her on further dressing instructions  MDM    Carlyle Dolly, PA-C 02/28/13 1407

## 2013-03-01 DIAGNOSIS — T8189XA Other complications of procedures, not elsewhere classified, initial encounter: Secondary | ICD-10-CM | POA: Diagnosis not present

## 2013-03-01 DIAGNOSIS — F329 Major depressive disorder, single episode, unspecified: Secondary | ICD-10-CM | POA: Diagnosis not present

## 2013-03-01 DIAGNOSIS — K219 Gastro-esophageal reflux disease without esophagitis: Secondary | ICD-10-CM | POA: Diagnosis not present

## 2013-03-01 DIAGNOSIS — G894 Chronic pain syndrome: Secondary | ICD-10-CM | POA: Diagnosis not present

## 2013-03-01 DIAGNOSIS — I10 Essential (primary) hypertension: Secondary | ICD-10-CM | POA: Diagnosis not present

## 2013-03-01 DIAGNOSIS — E669 Obesity, unspecified: Secondary | ICD-10-CM | POA: Diagnosis not present

## 2013-03-04 ENCOUNTER — Telehealth (INDEPENDENT_AMBULATORY_CARE_PROVIDER_SITE_OTHER): Payer: Self-pay | Admitting: General Surgery

## 2013-03-04 NOTE — Telephone Encounter (Signed)
error 

## 2013-03-04 NOTE — Telephone Encounter (Signed)
Called pt and let her know she has a po appt w/ Dr. Maisie Fus on 03/25/13 at 10:10

## 2013-03-08 ENCOUNTER — Telehealth (INDEPENDENT_AMBULATORY_CARE_PROVIDER_SITE_OTHER): Payer: Self-pay | Admitting: *Deleted

## 2013-03-08 DIAGNOSIS — K219 Gastro-esophageal reflux disease without esophagitis: Secondary | ICD-10-CM | POA: Diagnosis not present

## 2013-03-08 DIAGNOSIS — I10 Essential (primary) hypertension: Secondary | ICD-10-CM | POA: Diagnosis not present

## 2013-03-08 DIAGNOSIS — F329 Major depressive disorder, single episode, unspecified: Secondary | ICD-10-CM | POA: Diagnosis not present

## 2013-03-08 DIAGNOSIS — G894 Chronic pain syndrome: Secondary | ICD-10-CM | POA: Diagnosis not present

## 2013-03-08 DIAGNOSIS — T8189XA Other complications of procedures, not elsewhere classified, initial encounter: Secondary | ICD-10-CM | POA: Diagnosis not present

## 2013-03-08 DIAGNOSIS — E669 Obesity, unspecified: Secondary | ICD-10-CM | POA: Diagnosis not present

## 2013-03-08 NOTE — Telephone Encounter (Signed)
Patient called to report that that she had a colostomy reversal about 2 weeks ago but has not had a bowel movement since last Wednesday.  Spoke to [employer] who states it is ok for patient to take Miralax, stool softner, and make sure to increase fluids.  Patient states understanding and agreeable with the plan.  Patient will call back with updates if this does not help.

## 2013-03-09 ENCOUNTER — Telehealth (INDEPENDENT_AMBULATORY_CARE_PROVIDER_SITE_OTHER): Payer: Self-pay

## 2013-03-09 NOTE — Telephone Encounter (Signed)
Patient called to report that that she had a colostomy reversal about 2 weeks ago and has yet to have a bowel movement.  Patient advised to continue to take Miralax, stool softner, and make sure to increase fluids. Patient reports she's not drinking much fluids but,will increase them and call our office if needed.  Patient states understanding and agreeable with the plan. Patient will call back with updates if this does not help

## 2013-03-15 DIAGNOSIS — B356 Tinea cruris: Secondary | ICD-10-CM | POA: Diagnosis not present

## 2013-03-15 DIAGNOSIS — Z6841 Body Mass Index (BMI) 40.0 and over, adult: Secondary | ICD-10-CM | POA: Diagnosis not present

## 2013-03-17 DIAGNOSIS — I10 Essential (primary) hypertension: Secondary | ICD-10-CM | POA: Diagnosis not present

## 2013-03-17 DIAGNOSIS — K219 Gastro-esophageal reflux disease without esophagitis: Secondary | ICD-10-CM | POA: Diagnosis not present

## 2013-03-17 DIAGNOSIS — T8189XA Other complications of procedures, not elsewhere classified, initial encounter: Secondary | ICD-10-CM | POA: Diagnosis not present

## 2013-03-17 DIAGNOSIS — E669 Obesity, unspecified: Secondary | ICD-10-CM | POA: Diagnosis not present

## 2013-03-17 DIAGNOSIS — G894 Chronic pain syndrome: Secondary | ICD-10-CM | POA: Diagnosis not present

## 2013-03-17 DIAGNOSIS — F329 Major depressive disorder, single episode, unspecified: Secondary | ICD-10-CM | POA: Diagnosis not present

## 2013-03-21 ENCOUNTER — Encounter (INDEPENDENT_AMBULATORY_CARE_PROVIDER_SITE_OTHER): Payer: Medicare Other | Admitting: General Surgery

## 2013-03-24 DIAGNOSIS — G894 Chronic pain syndrome: Secondary | ICD-10-CM | POA: Diagnosis not present

## 2013-03-24 DIAGNOSIS — I10 Essential (primary) hypertension: Secondary | ICD-10-CM | POA: Diagnosis not present

## 2013-03-24 DIAGNOSIS — E669 Obesity, unspecified: Secondary | ICD-10-CM | POA: Diagnosis not present

## 2013-03-24 DIAGNOSIS — K219 Gastro-esophageal reflux disease without esophagitis: Secondary | ICD-10-CM | POA: Diagnosis not present

## 2013-03-24 DIAGNOSIS — T8189XA Other complications of procedures, not elsewhere classified, initial encounter: Secondary | ICD-10-CM | POA: Diagnosis not present

## 2013-03-24 DIAGNOSIS — F329 Major depressive disorder, single episode, unspecified: Secondary | ICD-10-CM | POA: Diagnosis not present

## 2013-03-25 ENCOUNTER — Ambulatory Visit (INDEPENDENT_AMBULATORY_CARE_PROVIDER_SITE_OTHER): Payer: Medicare Other | Admitting: General Surgery

## 2013-03-25 ENCOUNTER — Encounter (INDEPENDENT_AMBULATORY_CARE_PROVIDER_SITE_OTHER): Payer: Self-pay | Admitting: General Surgery

## 2013-03-25 VITALS — BP 132/78 | HR 88 | Temp 97.9°F | Resp 15 | Ht 63.0 in | Wt 287.8 lb

## 2013-03-25 DIAGNOSIS — Z9889 Other specified postprocedural states: Secondary | ICD-10-CM

## 2013-03-25 NOTE — Patient Instructions (Signed)
Start a fiber supplement daily and stool softeners twice a day.  Use Miralax as needed for constipation.  Ok to stop packing once the area is flush with the skin.   GETTING TO GOOD BOWEL HEALTH. Irregular bowel habits such as constipation can lead to many problems over time.  Having one soft bowel movement a day is the most important way to prevent further problems.  The anorectal canal is designed to handle stretching and feces to safely manage our ability to get rid of solid waste (feces, poop, stool) out of our body.  BUT, hard constipated stools can act like ripping concrete bricks causing inflamed hemorrhoids, anal fissures, abdominal pain and bloating.     The goal: ONE SOFT BOWEL MOVEMENT A DAY!  To have soft, regular bowel movements:    Drink at least 8 tall glasses of water a day.     Take plenty of fiber.  Fiber is the undigested part of plant food that passes into the colon, acting s "natures broom" to encourage bowel motility and movement.  Fiber can absorb and hold large amounts of water. This results in a larger, bulkier stool, which is soft and easier to pass. Work gradually over several weeks up to 6 servings a day of fiber (25g a day even more if needed) in the form of: o Vegetables -- Root (potatoes, carrots, turnips), leafy green (lettuce, salad greens, celery, spinach), or cooked high residue (cabbage, broccoli, etc) o Fruit -- Fresh (unpeeled skin & pulp), Dried (prunes, apricots, cherries, etc ),  or stewed ( applesauce)  o Whole grain breads, pasta, etc (whole wheat)  o Bran cereals    Bulking Agents -- This type of water-retaining fiber generally is easily obtained each day by one of the following:  o Psyllium bran -- The psyllium plant is remarkable because its ground seeds can retain so much water. This product is available as Metamucil, Konsyl, Effersyllium, Per Diem Fiber, or the less expensive generic preparation in drug and health food stores. Although labeled a laxative,  it really is not a laxative.  o Methylcellulose -- This is another fiber derived from wood which also retains water. It is available as Citrucel. o Polyethylene Glycol - and "artificial" fiber commonly called Miralax or Glycolax.  It is helpful for people with gassy or bloated feelings with regular fiber o Flax Seed - a less gassy fiber than psyllium   No reading or other relaxing activity while on the toilet. If bowel movements take longer than 5 minutes, you are too constipated   AVOID CONSTIPATION.  High fiber and water intake usually takes care of this.  Sometimes a laxative is needed to stimulate more frequent bowel movements, but    Laxatives are not a good long-term solution as it can wear the colon out. o Osmotics (Milk of Magnesia, Fleets phosphosoda, Magnesium citrate, MiraLax, GoLytely) are safer than  o Stimulants (Senokot, Castor Oil, Dulcolax, Ex Lax)    o Do not take laxatives for more than 7days in a row.    IF SEVERELY CONSTIPATED, try a Bowel Retraining Program: o Do not use laxatives.  o Eat a diet high in roughage, such as bran cereals and leafy vegetables.  o Drink six (6) ounces of prune or apricot juice each morning.  o Eat two (2) large servings of stewed fruit each day.  o Take one (1) heaping tablespoon of a psyllium-based bulking agent twice a day. Use sugar-free sweetener when possible to avoid excessive calories.  o Eat a normal breakfast.  o Set aside 15 minutes after breakfast to sit on the toilet, but do not strain to have a bowel movement.  o If you do not have a bowel movement by the third day, use an enema and repeat the above steps.

## 2013-03-25 NOTE — Progress Notes (Signed)
Jasmine Huff is a 57 y.o. obese female that is ~6 weeks (8/25) s/p colostomy reversal.  She is having trouble with regular BM's and miralax is giving her diarrhea.  She has lost about 20lbs intentionally. Exam:  Filed Vitals:   03/25/13 1008  BP: 132/78  Pulse: 88  Temp: 97.9 F (36.6 C)  Resp: 15   NAD  Ostomy site with good granulation tissue. Healing well  Abd: soft   Cont wet to dry until unable to pack anymore. F/U with me in 4 weeks.   Vanita Panda, MD  Colorectal and General Surgery  [facility]

## 2013-03-31 DIAGNOSIS — IMO0001 Reserved for inherently not codable concepts without codable children: Secondary | ICD-10-CM | POA: Diagnosis not present

## 2013-03-31 DIAGNOSIS — M19079 Primary osteoarthritis, unspecified ankle and foot: Secondary | ICD-10-CM | POA: Diagnosis not present

## 2013-03-31 DIAGNOSIS — M069 Rheumatoid arthritis, unspecified: Secondary | ICD-10-CM | POA: Diagnosis not present

## 2013-04-05 ENCOUNTER — Encounter (INDEPENDENT_AMBULATORY_CARE_PROVIDER_SITE_OTHER): Payer: Self-pay

## 2013-04-21 DIAGNOSIS — G4733 Obstructive sleep apnea (adult) (pediatric): Secondary | ICD-10-CM | POA: Diagnosis not present

## 2013-04-29 ENCOUNTER — Encounter (INDEPENDENT_AMBULATORY_CARE_PROVIDER_SITE_OTHER): Payer: Medicare Other | Admitting: General Surgery

## 2013-05-04 DIAGNOSIS — M1710 Unilateral primary osteoarthritis, unspecified knee: Secondary | ICD-10-CM | POA: Diagnosis not present

## 2013-06-14 ENCOUNTER — Encounter (HOSPITAL_COMMUNITY): Payer: Self-pay | Admitting: Emergency Medicine

## 2013-06-14 ENCOUNTER — Emergency Department (HOSPITAL_COMMUNITY)
Admission: EM | Admit: 2013-06-14 | Discharge: 2013-06-14 | Disposition: A | Discharge status: Home / Self Care | Payer: Medicare Other | Attending: Emergency Medicine | Admitting: Emergency Medicine

## 2013-06-14 ENCOUNTER — Emergency Department (HOSPITAL_COMMUNITY): Payer: Medicare Other

## 2013-06-14 DIAGNOSIS — K219 Gastro-esophageal reflux disease without esophagitis: Secondary | ICD-10-CM | POA: Diagnosis not present

## 2013-06-14 DIAGNOSIS — G4730 Sleep apnea, unspecified: Secondary | ICD-10-CM | POA: Insufficient documentation

## 2013-06-14 DIAGNOSIS — R109 Unspecified abdominal pain: Secondary | ICD-10-CM | POA: Insufficient documentation

## 2013-06-14 DIAGNOSIS — M129 Arthropathy, unspecified: Secondary | ICD-10-CM | POA: Insufficient documentation

## 2013-06-14 DIAGNOSIS — IMO0001 Reserved for inherently not codable concepts without codable children: Secondary | ICD-10-CM | POA: Diagnosis not present

## 2013-06-14 DIAGNOSIS — Z862 Personal history of diseases of the blood and blood-forming organs and certain disorders involving the immune mechanism: Secondary | ICD-10-CM | POA: Insufficient documentation

## 2013-06-14 DIAGNOSIS — K5289 Other specified noninfective gastroenteritis and colitis: Secondary | ICD-10-CM | POA: Diagnosis not present

## 2013-06-14 DIAGNOSIS — R112 Nausea with vomiting, unspecified: Secondary | ICD-10-CM | POA: Diagnosis not present

## 2013-06-14 DIAGNOSIS — Z9049 Acquired absence of other specified parts of digestive tract: Secondary | ICD-10-CM | POA: Diagnosis not present

## 2013-06-14 DIAGNOSIS — R1084 Generalized abdominal pain: Secondary | ICD-10-CM | POA: Diagnosis not present

## 2013-06-14 DIAGNOSIS — E119 Type 2 diabetes mellitus without complications: Secondary | ICD-10-CM | POA: Insufficient documentation

## 2013-06-14 DIAGNOSIS — F329 Major depressive disorder, single episode, unspecified: Secondary | ICD-10-CM | POA: Diagnosis not present

## 2013-06-14 DIAGNOSIS — R10819 Abdominal tenderness, unspecified site: Secondary | ICD-10-CM | POA: Diagnosis not present

## 2013-06-14 DIAGNOSIS — F410 Panic disorder [episodic paroxysmal anxiety] without agoraphobia: Secondary | ICD-10-CM | POA: Insufficient documentation

## 2013-06-14 DIAGNOSIS — Z9079 Acquired absence of other genital organ(s): Secondary | ICD-10-CM | POA: Insufficient documentation

## 2013-06-14 DIAGNOSIS — G43909 Migraine, unspecified, not intractable, without status migrainosus: Secondary | ICD-10-CM | POA: Insufficient documentation

## 2013-06-14 DIAGNOSIS — G894 Chronic pain syndrome: Secondary | ICD-10-CM | POA: Diagnosis not present

## 2013-06-14 DIAGNOSIS — Z79899 Other long term (current) drug therapy: Secondary | ICD-10-CM | POA: Insufficient documentation

## 2013-06-14 DIAGNOSIS — F3289 Other specified depressive episodes: Secondary | ICD-10-CM | POA: Insufficient documentation

## 2013-06-14 DIAGNOSIS — Z90710 Acquired absence of both cervix and uterus: Secondary | ICD-10-CM | POA: Diagnosis not present

## 2013-06-14 DIAGNOSIS — K2970 Gastritis, unspecified, without bleeding: Secondary | ICD-10-CM | POA: Diagnosis not present

## 2013-06-14 LAB — COMPREHENSIVE METABOLIC PANEL
ALT: 85 U/L — ABNORMAL HIGH (ref 0–35)
AST: 107 U/L — ABNORMAL HIGH (ref 0–37)
Albumin: 3.3 g/dL — ABNORMAL LOW (ref 3.5–5.2)
Alkaline Phosphatase: 161 U/L — ABNORMAL HIGH (ref 39–117)
BUN: 13 mg/dL (ref 6–23)
Potassium: 4.1 mEq/L (ref 3.5–5.1)
Sodium: 140 mEq/L (ref 135–145)
Total Protein: 7 g/dL (ref 6.0–8.3)

## 2013-06-14 LAB — CBC WITH DIFFERENTIAL/PLATELET
Basophils Absolute: 0 10*3/uL (ref 0.0–0.1)
Basophils Relative: 0 % (ref 0–1)
Eosinophils Absolute: 0 10*3/uL (ref 0.0–0.7)
MCH: 28.1 pg (ref 26.0–34.0)
MCHC: 31.9 g/dL (ref 30.0–36.0)
Neutrophils Relative %: 87 % — ABNORMAL HIGH (ref 43–77)
Platelets: 351 10*3/uL (ref 150–400)
RDW: 15.4 % (ref 11.5–15.5)

## 2013-06-14 LAB — URINALYSIS, ROUTINE W REFLEX MICROSCOPIC
Bilirubin Urine: NEGATIVE
Ketones, ur: NEGATIVE mg/dL
Leukocytes, UA: NEGATIVE
Nitrite: NEGATIVE
Urobilinogen, UA: 0.2 mg/dL (ref 0.0–1.0)

## 2013-06-14 LAB — LIPASE, BLOOD: Lipase: 13 U/L (ref 11–59)

## 2013-06-14 MED ORDER — MORPHINE SULFATE 4 MG/ML IJ SOLN
6.0000 mg | Freq: Once | INTRAMUSCULAR | Status: AC
  Administered 2013-06-14: 6 mg via INTRAVENOUS
  Filled 2013-06-14: qty 2

## 2013-06-14 MED ORDER — SODIUM CHLORIDE 0.9 % IV SOLN: 1000.0000 mL | INTRAVENOUS | Status: DC

## 2013-06-14 MED ORDER — SODIUM CHLORIDE 0.9 % IV SOLN
1000.0000 mL | Freq: Once | INTRAVENOUS | Status: AC
  Administered 2013-06-14: 1000 mL via INTRAVENOUS

## 2013-06-14 MED ORDER — BARIUM SULFATE 2.1 % PO SUSP
450.0000 mL | Freq: Once | ORAL | Status: DC
Start: 2013-06-14 — End: 2013-06-14

## 2013-06-14 MED ORDER — ONDANSETRON 8 MG PO TBDP: 8.0000 mg | ORAL_TABLET | Freq: Three times a day (TID) | ORAL | Status: DC | PRN

## 2013-06-14 MED ORDER — OXYCODONE HCL 5 MG PO TABS: 5.0000 mg | ORAL_TABLET | ORAL | Status: DC | PRN

## 2013-06-14 MED ORDER — METRONIDAZOLE 500 MG PO TABS
500.0000 mg | ORAL_TABLET | Freq: Once | ORAL | Status: AC
  Administered 2013-06-14: 500 mg via ORAL
  Filled 2013-06-14: qty 1

## 2013-06-14 MED ORDER — BARIUM SULFATE 2.1 % PO SUSP
450.0000 mL | Freq: Once | ORAL | Status: AC
  Administered 2013-06-14: 450 mL via ORAL

## 2013-06-14 MED ORDER — METRONIDAZOLE 500 MG PO TABS: 500.0000 mg | ORAL_TABLET | Freq: Two times a day (BID) | ORAL | Status: DC

## 2013-06-14 MED ORDER — ONDANSETRON HCL 4 MG/2ML IJ SOLN
4.0000 mg | Freq: Once | INTRAMUSCULAR | Status: AC
  Administered 2013-06-14: 4 mg via INTRAVENOUS
  Filled 2013-06-14: qty 2

## 2013-06-14 NOTE — ED Provider Notes (Signed)
CSN: [id number]     Arrival date & time 06/14/13  0806 History   First MD Initiated Contact with Patient 06/14/13 [phone]     Chief Complaint  Patient presents with  . Abdominal Pain  . Emesis    HPI Patient presents with acute onset worsening lower abdominal pain over the past 6-8 hours.  1 year ago she had a history of perforated sigmoid colon resulting in sigmoid colectomy followed by diverting colostomy.  She colostomy takedown in August of 2014 and his been doing well since then.  She's very concerned because her pain that began in the middle of the night feels very similar to her prior spontaneous perforation.  No fevers or chills.  Nausea and vomiting.  No diarrhea.  No urinary complaints.  Her pain is moderate to severe in severity at this time.  She's been uncomfortable throughout the night.   Past Medical History  Diagnosis Date  . Depression   . Allergy   . Chronic pain syndrome   . GERD (gastroesophageal reflux disease) 01/21/2011  . Anxiety   . Panic attacks   . Knee pain   . Urination frequency   . Perforation of colon 04/08/2012  . Anemia   . History of high blood pressure no meds for a few years  . Sleep apnea     CPAP  . History of diabetes mellitus     no mrds x few years, pt does not check blood sugar at home  . Migraine   . Fibromyalgia   . H/O hiatal hernia   . Arthritis     ra   Past Surgical History  Procedure Laterality Date  . Abdominal hysterectomy    . Right ovary removal    . Left hemicolectomy with splenic flexure mobilization and end colostomy  04/04/2012  . Laparotomy  04/04/2012    Procedure: EXPLORATORY LAPAROTOMY;  Surgeon: Romie Levee, MD;  Location: [facility];  Service: General;  Laterality: N/A;  left colectomy  . Colostomy  04/04/2012    Procedure: COLOSTOMY;  Surgeon: Romie Levee, MD;  Location: [facility];  Service: General;  Laterality: N/A;  . Colonoscopy N/A 11/12/2012    Procedure: COLONOSCOPY;  Surgeon: Romie Levee, MD;  Location: [facility];  Service: Endoscopy;  Laterality: N/A;  . Colon surgery  Oct 2013    perf bowel w/ colostomy  . Cholecystectomy  10 yrs ago  . Colostomy reversal N/A 02/17/2013    Procedure:  LAPAROSCOPIC COLOSTOMY REVERSAL; SMALL BOWEL RESECTION; LYSIS OF ADHESIONS ;  Surgeon: Romie Levee, MD;  Location: [facility];  Service: General;  Laterality: N/A;   Family History  Problem Relation Age of Onset  . Depression Mother   . Depression Father   . Alcohol abuse Brother   . Depression Brother   . Depression Maternal Grandfather   . Depression Other    History  Substance Use Topics  . Smoking status: Never Smoker   . Smokeless tobacco: Never Used  . Alcohol Use: No   OB History   Grav Para Term Preterm Abortions TAB SAB Ect Mult Living                 Review of Systems  All other systems reviewed and are negative.    Allergies  Pregabalin; Saphris; Iodinated diagnostic agents; and Seroquel  Home Medications   Current Outpatient Rx  Name  Route  Sig  Dispense  Refill  . ALPRAZolam (XANAX) 1 MG tablet   Oral  Take 1 mg by mouth 3 (three) times daily as needed for anxiety.          . ARIPiprazole (ABILIFY) 5 MG tablet   Oral   Take 5 mg by mouth at bedtime.         Marland Kitchen buPROPion (WELLBUTRIN XL) 150 MG 24 hr tablet   Oral   Take 450 mg by mouth every morning.         . cetirizine (ZYRTEC) 10 MG tablet   Oral   Take 10 mg by mouth every evening.          . clotrimazole-betamethasone (LOTRISONE) cream               . escitalopram (LEXAPRO) 20 MG tablet   Oral   Take 20 mg by mouth every morning.          . hydroxychloroquine (PLAQUENIL) 200 MG tablet   Oral   Take 200 mg by mouth 2 (two) times daily.          Marland Kitchen omega-3 acid ethyl esters (LOVAZA) 1 G capsule   Oral   Take 1 g by mouth 2 (two) times daily.         Marland Kitchen omeprazole (PRILOSEC) 40 MG capsule   Oral   Take 40 mg by mouth every evening.          Marland Kitchen oxyCODONE-acetaminophen (PERCOCET)  10-325 MG per tablet   Oral   Take 1 tablet by mouth 3 (three) times daily as needed for pain.   30 tablet   0   . traZODone (DESYREL) 100 MG tablet   Oral   Take 300 mg by mouth at bedtime.          . metroNIDAZOLE (FLAGYL) 500 MG tablet   Oral   Take 1 tablet (500 mg total) by mouth 2 (two) times daily.   14 tablet   0   . ondansetron (ZOFRAN ODT) 8 MG disintegrating tablet   Oral   Take 1 tablet (8 mg total) by mouth every 8 (eight) hours as needed for nausea or vomiting.   10 tablet   0   . oxyCODONE (ROXICODONE) 5 MG immediate release tablet   Oral   Take 1 tablet (5 mg total) by mouth every 4 (four) hours as needed for severe pain.   30 tablet   0    BP 140/78  Pulse 71  Temp(Src) 98.4 F (36.9 C) (Oral)  Resp 16  SpO2 97% Physical Exam  Nursing note and vitals reviewed. Constitutional: She is oriented to person, place, and time. She appears well-developed and well-nourished. No distress.  HENT:  Head: Normocephalic and atraumatic.  Eyes: EOM are normal.  Neck: Normal range of motion.  Cardiovascular: Normal rate, regular rhythm and normal heart sounds.   Pulmonary/Chest: Effort normal and breath sounds normal.  Abdominal: Soft. She exhibits no distension.  Obese abdomen.  Mild generalized lower abdominal tenderness without peritonitis.  No guarding or rebound  Musculoskeletal: Normal range of motion.  Neurological: She is alert and oriented to person, place, and time.  Skin: Skin is warm and dry.  Psychiatric: She has a normal mood and affect. Judgment normal.    ED Course  Procedures (including critical care time) Labs Review Labs Reviewed  URINALYSIS, ROUTINE W REFLEX MICROSCOPIC - Abnormal; Notable for the following:    APPearance CLOUDY (*)    All other components within normal limits  CBC WITH DIFFERENTIAL - Abnormal; Notable for the following:  WBC 11.5 (*)    Neutrophils Relative % 87 (*)    Neutro Abs 10.0 (*)    Lymphocytes Relative 7  (*)    All other components within normal limits  COMPREHENSIVE METABOLIC PANEL - Abnormal; Notable for the following:    Glucose, Bld 163 (*)    Albumin 3.3 (*)    AST 107 (*)    ALT 85 (*)    Alkaline Phosphatase 161 (*)    Total Bilirubin 0.2 (*)    GFR calc non Af Amer 77 (*)    GFR calc Af Amer 89 (*)    All other components within normal limits  LIPASE, BLOOD   Imaging Review Ct Abdomen Pelvis Wo Contrast  06/14/2013   CLINICAL DATA:  Abdominal pain.  EXAM: CT ABDOMEN AND PELVIS WITHOUT CONTRAST  TECHNIQUE: Multidetector CT imaging of the abdomen and pelvis was performed following the standard protocol without intravenous contrast.  COMPARISON:  04/28/2012  FINDINGS: Mild hypoventilation is appreciated within the lung bases.  The liver, spleen, adrenals, kidneys are unremarkable. There is partial fatty replacement of the pancreas which is otherwise unremarkable. There is no evidence of abdominal aortic aneurysm. Postsurgical changes identified within the mid to lower anterior abdominal wall on the left.  Within the anterior central portion of the abdomen images 41 through 69 are areas of bowel wall thickening involving loops of small bowel. There is inflammation within the surrounding mesenteric fat and a small amount of surrounding free fluid. No loculated fluid collections are identified nor evidence of free air. This extends into the region of an anastomotic site image 67 series 2. These findings are consistent with enteritis. There is no evidence of bowel obstruction. The appendix is identified is unremarkable. There is no evidence of or appreciable pathologic sized adenopathy within the abdomen or pelvis. Patient is status post cholecystectomy.  IMPRESSION: Findings consistent with enteritis involving the distal small bowel to the level of the anastomotic site in the lower abdomen. No drainable loculated fluid collection identified nor evidence of pneumoperitoneum. Differential  considerations are infectious versus inflammatory. Vascular compromise cannot be excluded low is of lower differential consideration less clinically appropriate.   Electronically Signed   By: Salome Holmes M.D.   On: 06/14/2013 12:11  I personally reviewed the imaging tests through PACS system I reviewed available ER/hospitalization records through the EMR   EKG Interpretation   None       MDM   1. Abdominal pain    Given patient's complex past medical history and prior abdominal surgery CT scan will be performed to evaluate further.  Pain and nausea treated.  N.p.o.  12:43 PM Patient feels much better at this time..  Abdominal exam is benign.  Discharge home in good condition.  Given her complex past surgical history have asked that the patient followup with a provider in 48 hours.  This can be her general surgeon, her primary care physician, or return to the ER if unable to followup with her other providers in 48 hours for recheck.  She understands to return to the ER for new or worsening symptoms.  Given the patient's findings on CT scan which likely represent enteritis the patient will be discharged with Flagyl while awaiting  followup    Lyanne Co, MD 06/14/13 1244

## 2013-06-14 NOTE — ED Notes (Signed)
Pt states she has been having bilateral lower abd pain for past 12 hours with 3 episodes of emesis, no diarrhea. Pt has hx of perforated bowel a year ago. Bowel sounds audible

## 2013-06-14 NOTE — ED Notes (Signed)
Bed: WA21 Expected date:  Expected time:  Means of arrival:  Comments: 57 y/o F abd pain x 2 days

## 2013-06-14 NOTE — ED Notes (Signed)
Patient transported to CT 

## 2013-06-24 ENCOUNTER — Encounter (INDEPENDENT_AMBULATORY_CARE_PROVIDER_SITE_OTHER): Payer: Medicare Other | Admitting: General Surgery

## 2013-08-10 DIAGNOSIS — M1710 Unilateral primary osteoarthritis, unspecified knee: Secondary | ICD-10-CM | POA: Diagnosis not present

## 2013-08-15 DIAGNOSIS — G4733 Obstructive sleep apnea (adult) (pediatric): Secondary | ICD-10-CM | POA: Diagnosis not present

## 2013-10-03 ENCOUNTER — Telehealth (INDEPENDENT_AMBULATORY_CARE_PROVIDER_SITE_OTHER): Payer: Self-pay

## 2013-10-03 NOTE — Telephone Encounter (Signed)
Pt calling in b/c she wants Dr Marcello Moores to know that she is having lots of nausea daily. The pt reports that she has had the nausea since her colostomy reversal from August 2014. The pt's bowels are fine she said she has a BM daily. The pt thinks that the nausea could be coming from coffee b/c the nausea is the worst in the am but then the pt states that it could happen in the pm as well. The pt wanted to check with you first about the nausea before she contacts her PCP. I advised pt that Dr Marcello Moores was on vacation so this message want get answered for a few days and the pt is ok b/c she said this has been going on for 4mo.

## 2013-10-04 NOTE — Telephone Encounter (Signed)
Please have her discuss with her PCP.  May need to see GI.  May need imaging.

## 2013-10-04 NOTE — Telephone Encounter (Signed)
Advised patient to start with her PCP , at that time she may be referred to a GI physician, depending on his/her findings. Advised her if she has further questions. Patient verbalized understanding

## 2013-10-17 ENCOUNTER — Other Ambulatory Visit: Payer: Self-pay

## 2013-10-17 DIAGNOSIS — Z1231 Encounter for screening mammogram for malignant neoplasm of breast: Secondary | ICD-10-CM

## 2013-10-27 DIAGNOSIS — M2550 Pain in unspecified joint: Secondary | ICD-10-CM | POA: Diagnosis not present

## 2013-10-27 DIAGNOSIS — IMO0001 Reserved for inherently not codable concepts without codable children: Secondary | ICD-10-CM | POA: Diagnosis not present

## 2013-10-27 DIAGNOSIS — M069 Rheumatoid arthritis, unspecified: Secondary | ICD-10-CM | POA: Diagnosis not present

## 2013-10-27 DIAGNOSIS — Z09 Encounter for follow-up examination after completed treatment for conditions other than malignant neoplasm: Secondary | ICD-10-CM | POA: Diagnosis not present

## 2013-10-31 DIAGNOSIS — IMO0001 Reserved for inherently not codable concepts without codable children: Secondary | ICD-10-CM | POA: Diagnosis not present

## 2013-10-31 DIAGNOSIS — R5383 Other fatigue: Secondary | ICD-10-CM | POA: Diagnosis not present

## 2013-10-31 DIAGNOSIS — R5381 Other malaise: Secondary | ICD-10-CM | POA: Diagnosis not present

## 2013-10-31 DIAGNOSIS — E119 Type 2 diabetes mellitus without complications: Secondary | ICD-10-CM | POA: Diagnosis not present

## 2013-10-31 DIAGNOSIS — G479 Sleep disorder, unspecified: Secondary | ICD-10-CM | POA: Diagnosis not present

## 2013-10-31 DIAGNOSIS — E559 Vitamin D deficiency, unspecified: Secondary | ICD-10-CM | POA: Diagnosis not present

## 2013-10-31 DIAGNOSIS — I10 Essential (primary) hypertension: Secondary | ICD-10-CM | POA: Diagnosis not present

## 2013-10-31 DIAGNOSIS — D649 Anemia, unspecified: Secondary | ICD-10-CM | POA: Diagnosis not present

## 2013-10-31 DIAGNOSIS — F329 Major depressive disorder, single episode, unspecified: Secondary | ICD-10-CM | POA: Diagnosis not present

## 2013-11-01 ENCOUNTER — Ambulatory Visit: Payer: Self-pay

## 2013-11-08 ENCOUNTER — Ambulatory Visit
Admission: RE | Admit: 2013-11-08 | Discharge: 2013-11-08 | Disposition: A | Discharge status: Home / Self Care | Payer: Medicare Other | Source: Ambulatory Visit

## 2013-11-08 ENCOUNTER — Encounter (INDEPENDENT_AMBULATORY_CARE_PROVIDER_SITE_OTHER): Payer: Self-pay

## 2013-11-08 DIAGNOSIS — Z1231 Encounter for screening mammogram for malignant neoplasm of breast: Secondary | ICD-10-CM

## 2013-11-22 ENCOUNTER — Telehealth (INDEPENDENT_AMBULATORY_CARE_PROVIDER_SITE_OTHER): Payer: Self-pay

## 2013-11-22 NOTE — Telephone Encounter (Signed)
Approved Appointment per  DR. Thomas 12-09-13@ 2:10

## 2013-11-22 NOTE — Telephone Encounter (Signed)
Patient states she is having pain near the incisional site for her colostomy reversal , she states DR. Marcello Moores had told her she had a hernia when she had her reversal. Chartered loss adjuster with another physician but  Patient stated she  rather not see another physician in the practice. Advised her I would  speak to DR. Thomas and call her back.

## 2013-11-22 NOTE — Telephone Encounter (Signed)
That's fine.  Would be glad to see her.

## 2013-12-02 DIAGNOSIS — G8929 Other chronic pain: Secondary | ICD-10-CM | POA: Diagnosis not present

## 2013-12-02 DIAGNOSIS — M1710 Unilateral primary osteoarthritis, unspecified knee: Secondary | ICD-10-CM | POA: Diagnosis not present

## 2013-12-02 DIAGNOSIS — M069 Rheumatoid arthritis, unspecified: Secondary | ICD-10-CM | POA: Diagnosis not present

## 2013-12-09 ENCOUNTER — Encounter (INDEPENDENT_AMBULATORY_CARE_PROVIDER_SITE_OTHER): Payer: Self-pay | Admitting: General Surgery

## 2014-01-25 DIAGNOSIS — M1710 Unilateral primary osteoarthritis, unspecified knee: Secondary | ICD-10-CM | POA: Diagnosis not present

## 2014-01-25 DIAGNOSIS — G8929 Other chronic pain: Secondary | ICD-10-CM | POA: Diagnosis not present

## 2014-01-27 ENCOUNTER — Other Ambulatory Visit: Payer: Self-pay | Admitting: Dermatology

## 2014-01-27 DIAGNOSIS — C4441 Basal cell carcinoma of skin of scalp and neck: Secondary | ICD-10-CM | POA: Diagnosis not present

## 2014-01-27 DIAGNOSIS — D692 Other nonthrombocytopenic purpura: Secondary | ICD-10-CM | POA: Diagnosis not present

## 2014-01-27 DIAGNOSIS — H61009 Unspecified perichondritis of external ear, unspecified ear: Secondary | ICD-10-CM | POA: Diagnosis not present

## 2014-03-01 DIAGNOSIS — M1710 Unilateral primary osteoarthritis, unspecified knee: Secondary | ICD-10-CM | POA: Diagnosis not present

## 2014-03-06 DIAGNOSIS — M1710 Unilateral primary osteoarthritis, unspecified knee: Secondary | ICD-10-CM | POA: Diagnosis not present

## 2014-03-06 DIAGNOSIS — G8929 Other chronic pain: Secondary | ICD-10-CM | POA: Diagnosis not present

## 2014-03-09 IMAGING — CR DG ABDOMEN ACUTE W/ 1V CHEST
5 series · 5 of 5 positions shown · non-contrast
Comparison: Chest radiograph 04/07/2012

CLINICAL DATA: Nausea, fever, abdominal pain

ACUTE ABDOMEN SERIES (ABDOMEN 2 VIEW & CHEST 1 VIEW)

[x abdomen supine (1 of 2)]
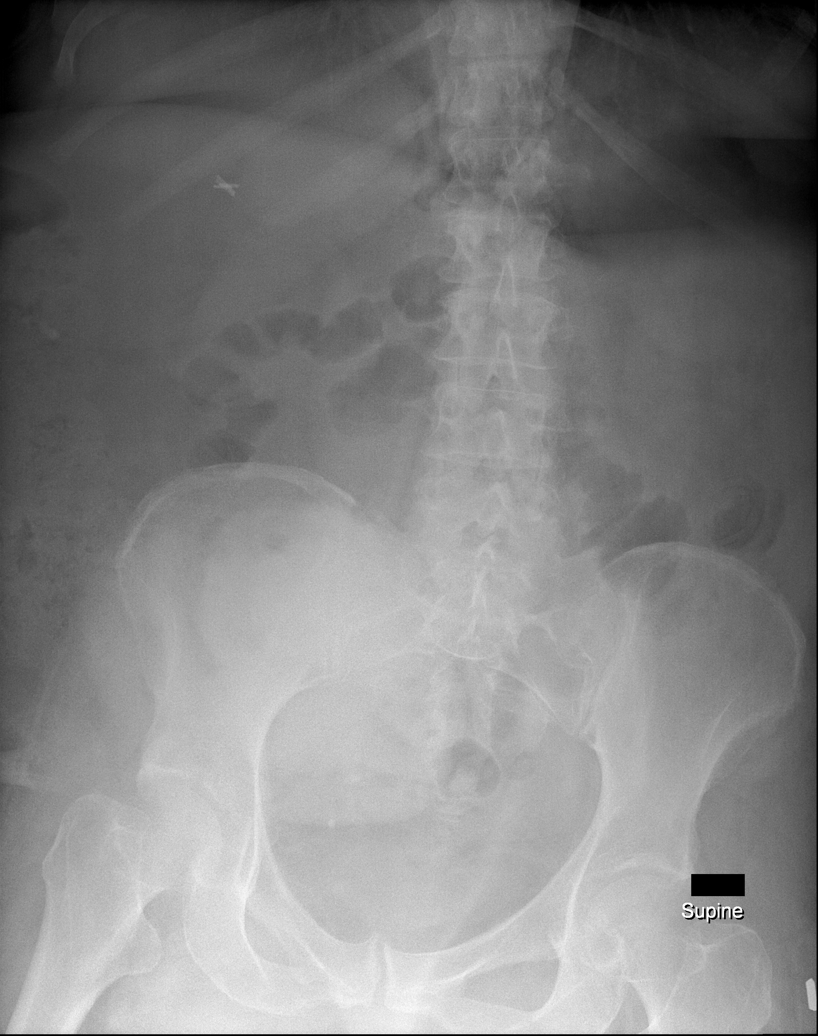

[x abdomen supine (2 of 2)]
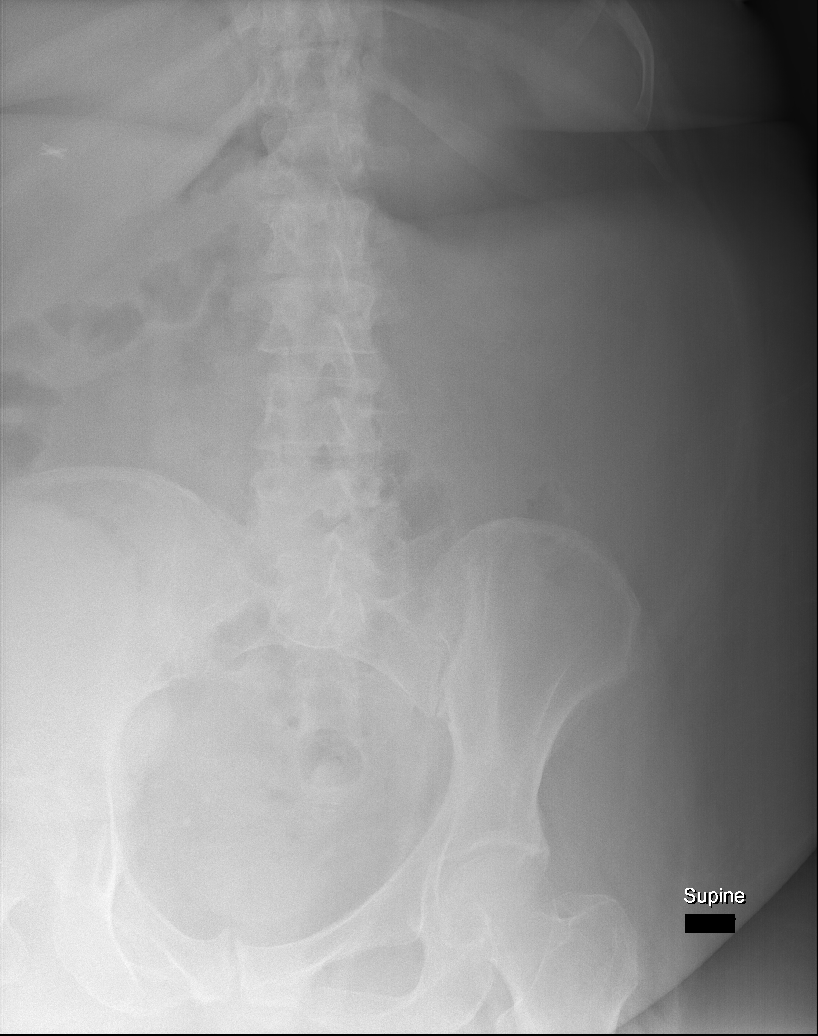

[w abdomen decub (1 of 2)]
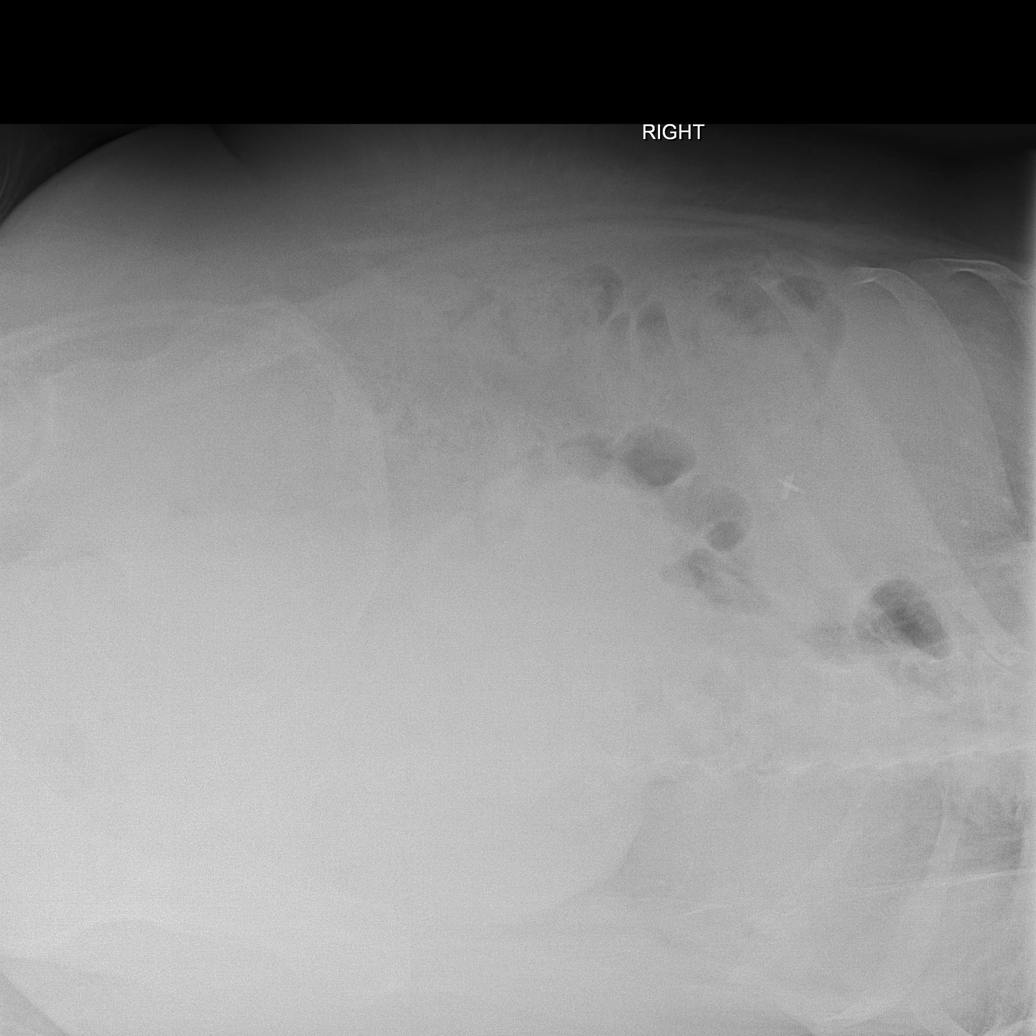

[w abdomen decub (2 of 2)]
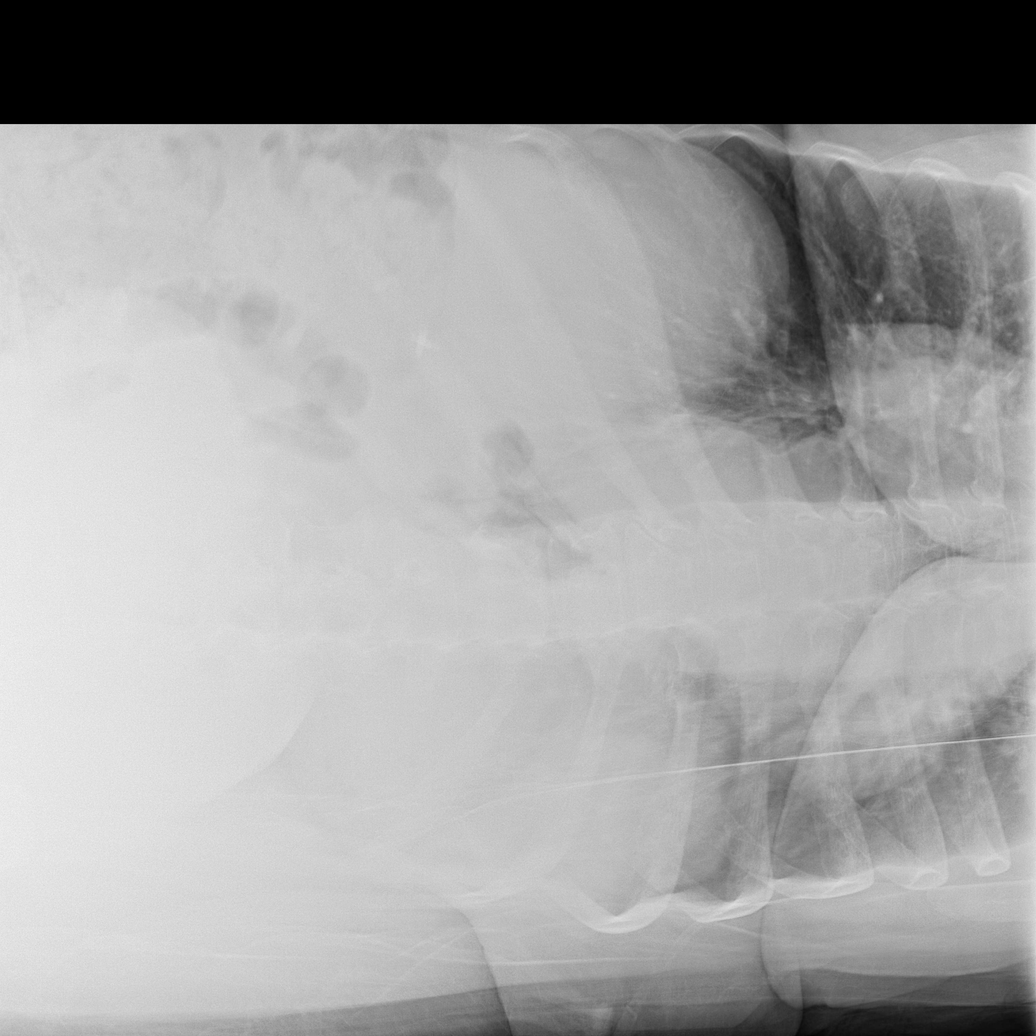

[x chest ap]
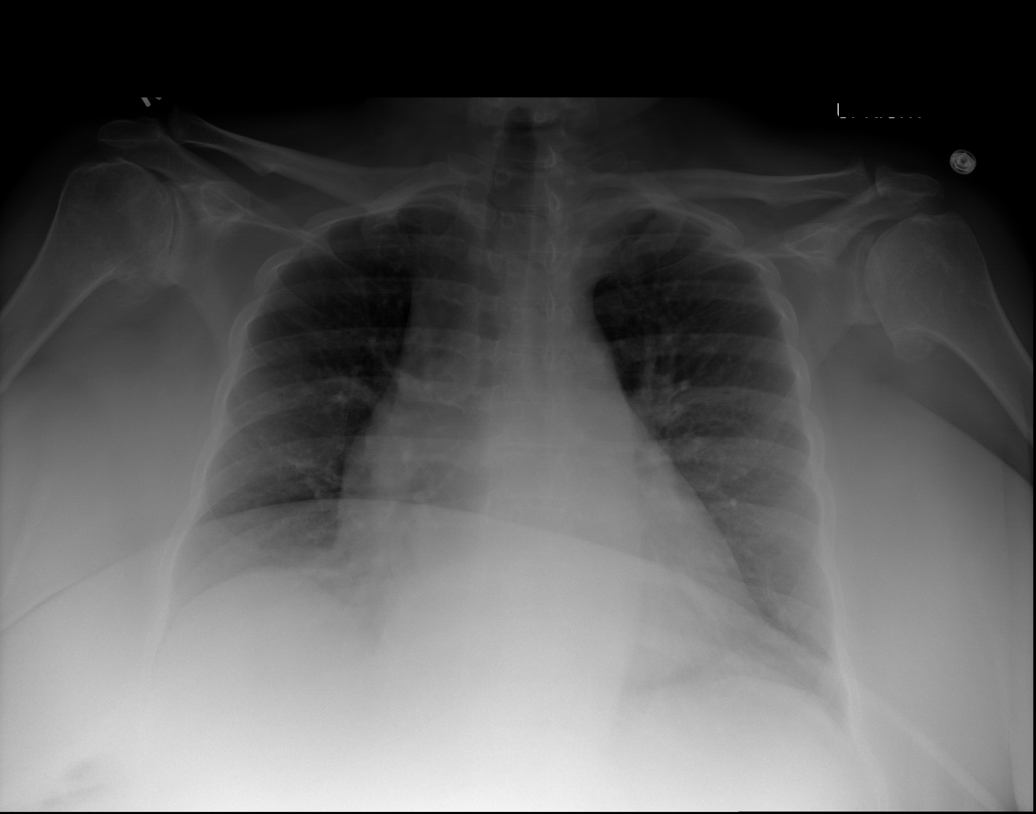

[5 of 5 positions shown; findings below may reference images not displayed]

FINDINGS: Enlargement of cardiac silhouette with pulmonary vascular
congestion.
Lungs clear.
Bones demineralized with bilateral glenohumeral degenerative
changes noted.
Surgical clips right upper quadrant question cholecystectomy.
Nonobstructive bowel gas pattern.
No definite bowel dilatation or bowel wall thickening.
Large radiopacity is seen in the right pelvis, [DATE] x 9.9 cm,
question ostomy bag.
No acute osseous findings or definite urinary tract calcification.
No free intraperitoneal air.
IMPRESSION: Large radiopacity in the right pelvis, question ostomy bag.
Enlargement of cardiac silhouette with pulmonary vascular
congestion.
Otherwise negative exam.

## 2014-03-11 IMAGING — CR DG CHEST 2V
3 series · 3 of 3 positions shown · non-contrast
Comparison: CT chest abdomen pelvis 04/28/2012 and chest radiograph
04/07/2012

CLINICAL DATA: Follow up possible pneumonia versus atelectasis.

CHEST - 2 VIEW

[w chest lat * (1 of 2)]
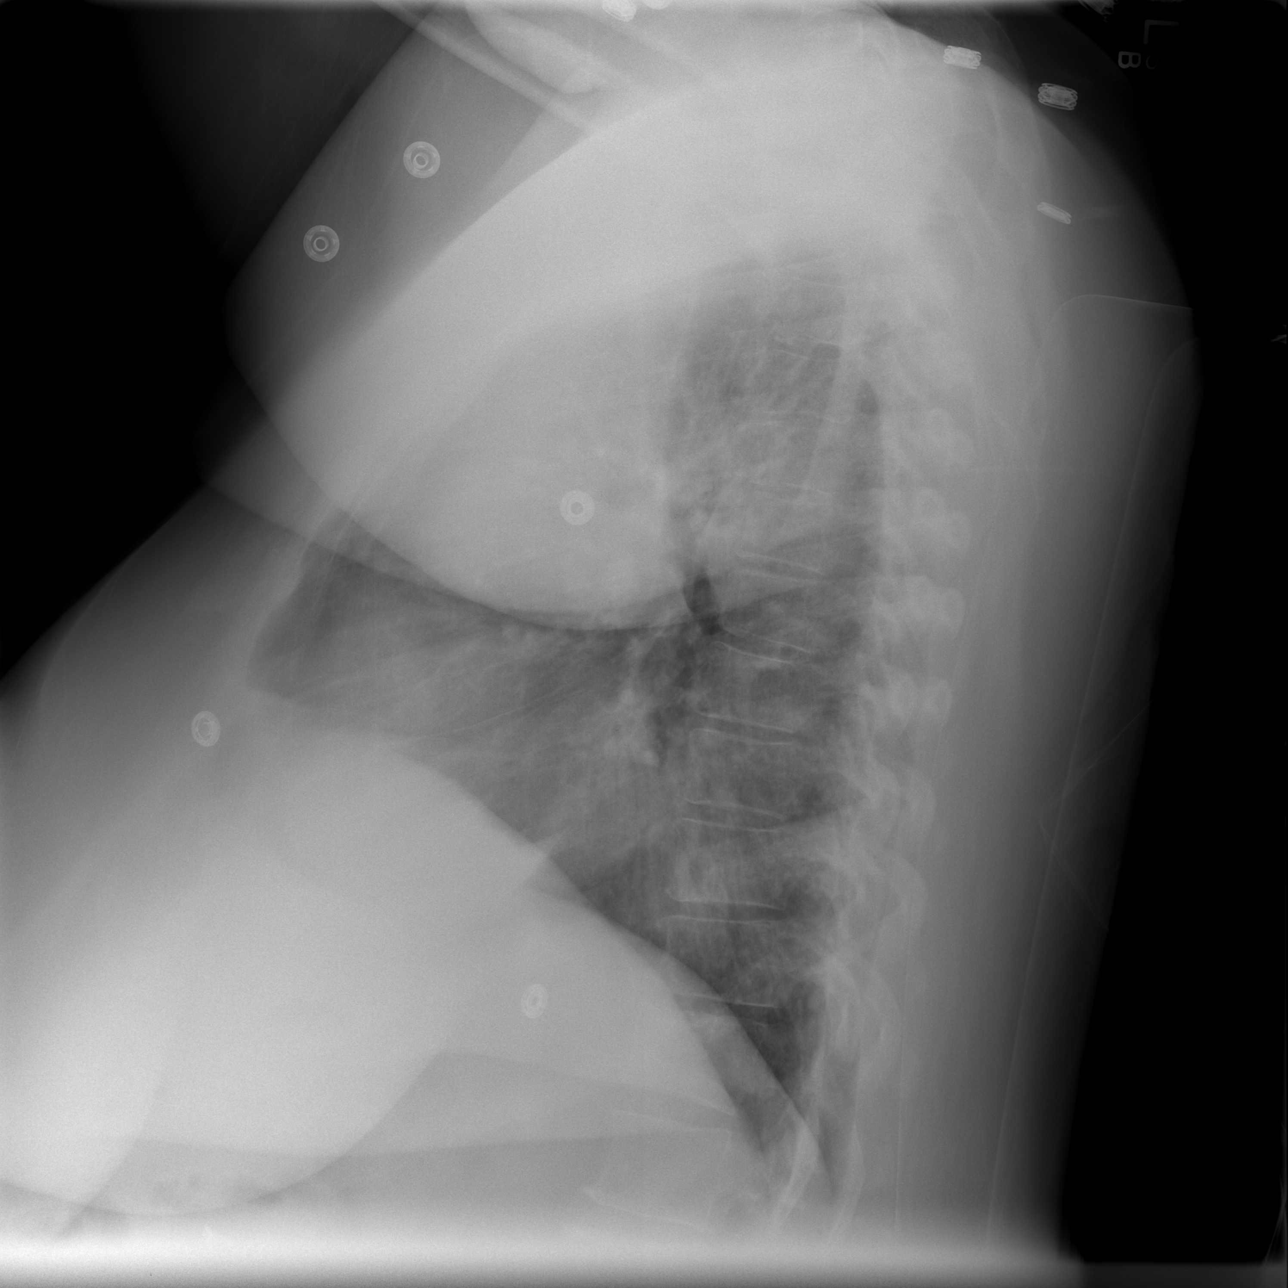

[w chest lat * (2 of 2)]
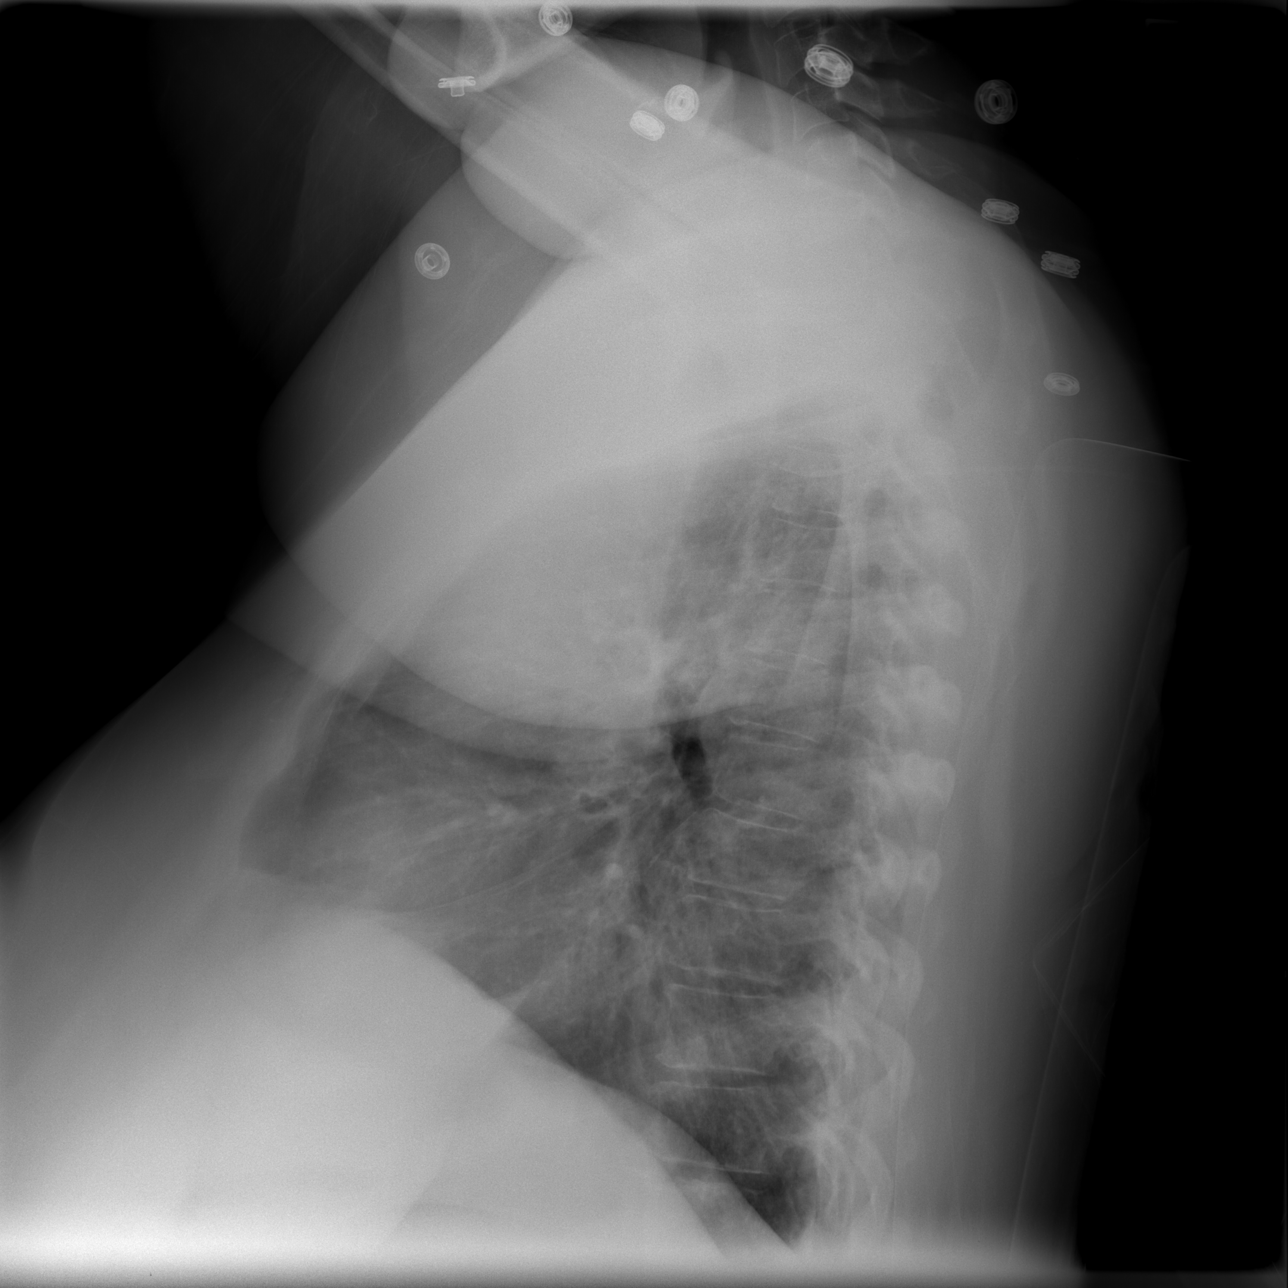

[view not recorded]
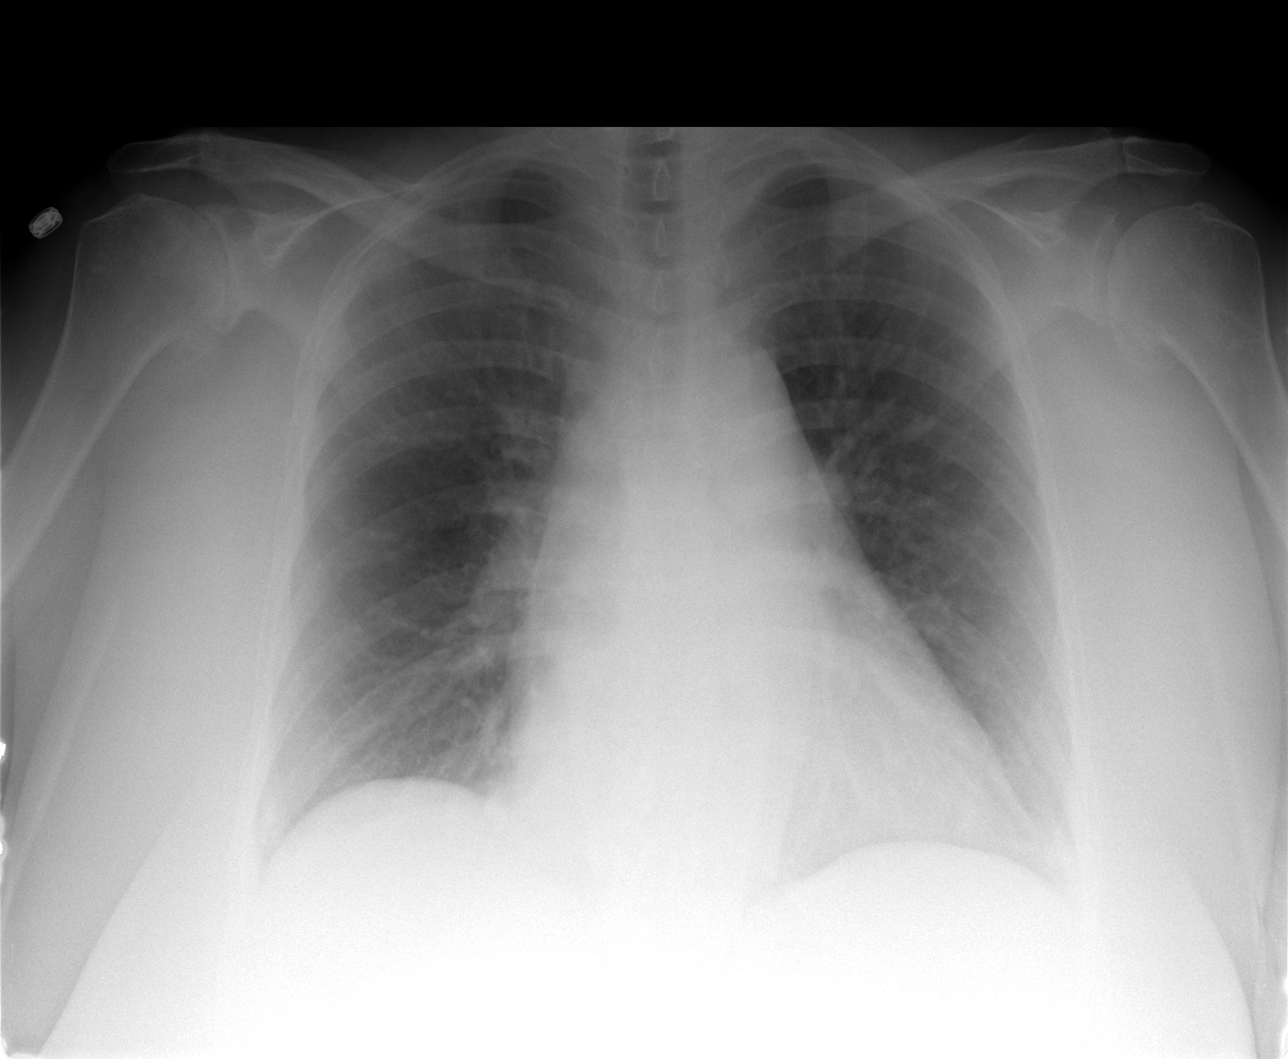

[3 of 3 positions shown; findings below may reference images not displayed]

FINDINGS: Heart size is upper normal.  Thoracic aorta and hilar
contours are normal.  Convex mediastinal contour adjacent to the
right heart likely reflects the azygo-esophageal recess fluid
collections seen on recent chest CT.  Pulmonary vascularity is
within normal limits.  Tiny opacity in the lingula likely reflects
atelectasis or scarring.  There is bilateral glenohumeral
arthritis.

Negative for pleural effusion or pneumothorax.
IMPRESSION: 1. Tiny lingular opacity is favored to be due to atelectasis or
scarring.

2. Small convex mediastinal contour adjacent to the right heart
border likely reflects the known fluid collection in the azygo-
esophageal recess as described on CT chest 04/28/2012.

## 2014-03-22 DIAGNOSIS — M9320 Osteochondritis dissecans of unspecified site: Secondary | ICD-10-CM | POA: Diagnosis not present

## 2014-03-22 DIAGNOSIS — M069 Rheumatoid arthritis, unspecified: Secondary | ICD-10-CM | POA: Diagnosis not present

## 2014-03-30 ENCOUNTER — Other Ambulatory Visit (HOSPITAL_COMMUNITY): Payer: Self-pay | Admitting: Physician Assistant

## 2014-03-30 DIAGNOSIS — M25562 Pain in left knee: Secondary | ICD-10-CM

## 2014-04-12 ENCOUNTER — Ambulatory Visit (HOSPITAL_COMMUNITY)
Admission: RE | Admit: 2014-04-12 | Discharge: 2014-04-12 | Disposition: A | Discharge status: Home / Self Care | Payer: Medicare Other | Source: Ambulatory Visit | Attending: Physician Assistant | Admitting: Physician Assistant

## 2014-04-12 DIAGNOSIS — R609 Edema, unspecified: Secondary | ICD-10-CM | POA: Diagnosis not present

## 2014-04-12 DIAGNOSIS — M25562 Pain in left knee: Secondary | ICD-10-CM | POA: Insufficient documentation

## 2014-04-12 DIAGNOSIS — M23006 Cystic meniscus, unspecified meniscus, right knee: Secondary | ICD-10-CM | POA: Diagnosis not present

## 2014-04-12 DIAGNOSIS — M23007 Cystic meniscus, unspecified meniscus, left knee: Secondary | ICD-10-CM | POA: Diagnosis not present

## 2014-04-18 DIAGNOSIS — Z85828 Personal history of other malignant neoplasm of skin: Secondary | ICD-10-CM | POA: Diagnosis not present

## 2014-04-18 DIAGNOSIS — C4441 Basal cell carcinoma of skin of scalp and neck: Secondary | ICD-10-CM | POA: Diagnosis not present

## 2014-04-26 DIAGNOSIS — Z79899 Other long term (current) drug therapy: Secondary | ICD-10-CM | POA: Diagnosis not present

## 2014-04-26 DIAGNOSIS — E119 Type 2 diabetes mellitus without complications: Secondary | ICD-10-CM | POA: Diagnosis not present

## 2014-05-29 DIAGNOSIS — Z79899 Other long term (current) drug therapy: Secondary | ICD-10-CM | POA: Diagnosis not present

## 2014-05-29 DIAGNOSIS — M5386 Other specified dorsopathies, lumbar region: Secondary | ICD-10-CM | POA: Diagnosis not present

## 2014-05-29 DIAGNOSIS — Z79891 Long term (current) use of opiate analgesic: Secondary | ICD-10-CM | POA: Diagnosis not present

## 2014-05-29 DIAGNOSIS — G894 Chronic pain syndrome: Secondary | ICD-10-CM | POA: Diagnosis not present

## 2014-06-14 DIAGNOSIS — M19071 Primary osteoarthritis, right ankle and foot: Secondary | ICD-10-CM | POA: Diagnosis not present

## 2014-06-14 DIAGNOSIS — M25561 Pain in right knee: Secondary | ICD-10-CM | POA: Diagnosis not present

## 2014-06-14 DIAGNOSIS — M0579 Rheumatoid arthritis with rheumatoid factor of multiple sites without organ or systems involvement: Secondary | ICD-10-CM | POA: Diagnosis not present

## 2014-06-14 DIAGNOSIS — M19041 Primary osteoarthritis, right hand: Secondary | ICD-10-CM | POA: Diagnosis not present

## 2014-06-20 DIAGNOSIS — E538 Deficiency of other specified B group vitamins: Secondary | ICD-10-CM | POA: Diagnosis not present

## 2014-06-20 DIAGNOSIS — E119 Type 2 diabetes mellitus without complications: Secondary | ICD-10-CM | POA: Diagnosis not present

## 2014-06-20 DIAGNOSIS — M5417 Radiculopathy, lumbosacral region: Secondary | ICD-10-CM | POA: Diagnosis not present

## 2014-06-20 DIAGNOSIS — Z23 Encounter for immunization: Secondary | ICD-10-CM | POA: Diagnosis not present

## 2014-06-20 DIAGNOSIS — E559 Vitamin D deficiency, unspecified: Secondary | ICD-10-CM | POA: Diagnosis not present

## 2014-06-20 DIAGNOSIS — D509 Iron deficiency anemia, unspecified: Secondary | ICD-10-CM | POA: Diagnosis not present

## 2014-06-20 DIAGNOSIS — I10 Essential (primary) hypertension: Secondary | ICD-10-CM | POA: Diagnosis not present

## 2014-06-20 DIAGNOSIS — F324 Major depressive disorder, single episode, in partial remission: Secondary | ICD-10-CM | POA: Diagnosis not present

## 2014-06-20 DIAGNOSIS — Z79899 Other long term (current) drug therapy: Secondary | ICD-10-CM | POA: Diagnosis not present

## 2014-06-26 DIAGNOSIS — M5386 Other specified dorsopathies, lumbar region: Secondary | ICD-10-CM | POA: Diagnosis not present

## 2014-06-26 DIAGNOSIS — G894 Chronic pain syndrome: Secondary | ICD-10-CM | POA: Diagnosis not present

## 2014-06-26 DIAGNOSIS — M791 Myalgia: Secondary | ICD-10-CM | POA: Diagnosis not present

## 2014-06-26 DIAGNOSIS — M5137 Other intervertebral disc degeneration, lumbosacral region: Secondary | ICD-10-CM | POA: Diagnosis not present

## 2014-06-26 DIAGNOSIS — Z79891 Long term (current) use of opiate analgesic: Secondary | ICD-10-CM | POA: Diagnosis not present

## 2014-06-26 DIAGNOSIS — Z79899 Other long term (current) drug therapy: Secondary | ICD-10-CM | POA: Diagnosis not present

## 2014-07-08 DIAGNOSIS — T1490 Injury, unspecified: Secondary | ICD-10-CM | POA: Diagnosis not present

## 2014-07-19 DIAGNOSIS — R69 Illness, unspecified: Secondary | ICD-10-CM | POA: Diagnosis not present

## 2014-07-24 DIAGNOSIS — M797 Fibromyalgia: Secondary | ICD-10-CM | POA: Diagnosis not present

## 2014-07-24 DIAGNOSIS — M792 Neuralgia and neuritis, unspecified: Secondary | ICD-10-CM | POA: Diagnosis not present

## 2014-07-24 DIAGNOSIS — Z79899 Other long term (current) drug therapy: Secondary | ICD-10-CM | POA: Diagnosis not present

## 2014-07-24 DIAGNOSIS — M4697 Unspecified inflammatory spondylopathy, lumbosacral region: Secondary | ICD-10-CM | POA: Diagnosis not present

## 2014-07-24 DIAGNOSIS — G894 Chronic pain syndrome: Secondary | ICD-10-CM | POA: Diagnosis not present

## 2014-07-24 DIAGNOSIS — M488X6 Other specified spondylopathies, lumbar region: Secondary | ICD-10-CM | POA: Diagnosis not present

## 2014-07-24 DIAGNOSIS — M549 Dorsalgia, unspecified: Secondary | ICD-10-CM | POA: Diagnosis not present

## 2014-08-06 DIAGNOSIS — R69 Illness, unspecified: Secondary | ICD-10-CM | POA: Diagnosis not present

## 2014-08-24 DIAGNOSIS — F411 Generalized anxiety disorder: Secondary | ICD-10-CM | POA: Diagnosis not present

## 2014-08-24 DIAGNOSIS — F331 Major depressive disorder, recurrent, moderate: Secondary | ICD-10-CM | POA: Diagnosis not present

## 2014-08-28 DIAGNOSIS — M4697 Unspecified inflammatory spondylopathy, lumbosacral region: Secondary | ICD-10-CM | POA: Diagnosis not present

## 2014-08-28 DIAGNOSIS — M488X6 Other specified spondylopathies, lumbar region: Secondary | ICD-10-CM | POA: Diagnosis not present

## 2014-08-28 DIAGNOSIS — M549 Dorsalgia, unspecified: Secondary | ICD-10-CM | POA: Diagnosis not present

## 2014-08-28 DIAGNOSIS — R52 Pain, unspecified: Secondary | ICD-10-CM | POA: Diagnosis not present

## 2014-08-28 DIAGNOSIS — G894 Chronic pain syndrome: Secondary | ICD-10-CM | POA: Diagnosis not present

## 2014-08-28 DIAGNOSIS — M792 Neuralgia and neuritis, unspecified: Secondary | ICD-10-CM | POA: Diagnosis not present

## 2014-08-28 DIAGNOSIS — M797 Fibromyalgia: Secondary | ICD-10-CM | POA: Diagnosis not present

## 2014-08-29 DIAGNOSIS — F331 Major depressive disorder, recurrent, moderate: Secondary | ICD-10-CM | POA: Diagnosis not present

## 2014-08-29 DIAGNOSIS — F411 Generalized anxiety disorder: Secondary | ICD-10-CM | POA: Diagnosis not present

## 2014-09-12 DIAGNOSIS — F411 Generalized anxiety disorder: Secondary | ICD-10-CM | POA: Diagnosis not present

## 2014-09-12 DIAGNOSIS — F331 Major depressive disorder, recurrent, moderate: Secondary | ICD-10-CM | POA: Diagnosis not present

## 2014-09-18 DIAGNOSIS — G894 Chronic pain syndrome: Secondary | ICD-10-CM | POA: Diagnosis not present

## 2014-09-18 DIAGNOSIS — M549 Dorsalgia, unspecified: Secondary | ICD-10-CM | POA: Diagnosis not present

## 2014-09-18 DIAGNOSIS — M797 Fibromyalgia: Secondary | ICD-10-CM | POA: Diagnosis not present

## 2014-09-18 DIAGNOSIS — M488X6 Other specified spondylopathies, lumbar region: Secondary | ICD-10-CM | POA: Diagnosis not present

## 2014-09-18 DIAGNOSIS — Z79899 Other long term (current) drug therapy: Secondary | ICD-10-CM | POA: Diagnosis not present

## 2014-09-18 DIAGNOSIS — M4697 Unspecified inflammatory spondylopathy, lumbosacral region: Secondary | ICD-10-CM | POA: Diagnosis not present

## 2014-09-21 DIAGNOSIS — F331 Major depressive disorder, recurrent, moderate: Secondary | ICD-10-CM | POA: Diagnosis not present

## 2014-09-21 DIAGNOSIS — F411 Generalized anxiety disorder: Secondary | ICD-10-CM | POA: Diagnosis not present

## 2014-10-17 DIAGNOSIS — F411 Generalized anxiety disorder: Secondary | ICD-10-CM | POA: Diagnosis not present

## 2014-10-17 DIAGNOSIS — F331 Major depressive disorder, recurrent, moderate: Secondary | ICD-10-CM | POA: Diagnosis not present

## 2014-10-23 DIAGNOSIS — Z79899 Other long term (current) drug therapy: Secondary | ICD-10-CM | POA: Diagnosis not present

## 2014-10-23 DIAGNOSIS — M549 Dorsalgia, unspecified: Secondary | ICD-10-CM | POA: Diagnosis not present

## 2014-10-23 DIAGNOSIS — M4697 Unspecified inflammatory spondylopathy, lumbosacral region: Secondary | ICD-10-CM | POA: Diagnosis not present

## 2014-10-23 DIAGNOSIS — G894 Chronic pain syndrome: Secondary | ICD-10-CM | POA: Diagnosis not present

## 2014-10-23 DIAGNOSIS — M797 Fibromyalgia: Secondary | ICD-10-CM | POA: Diagnosis not present

## 2014-11-03 DIAGNOSIS — N3281 Overactive bladder: Secondary | ICD-10-CM | POA: Diagnosis not present

## 2014-11-03 DIAGNOSIS — I10 Essential (primary) hypertension: Secondary | ICD-10-CM | POA: Diagnosis not present

## 2014-11-03 DIAGNOSIS — M797 Fibromyalgia: Secondary | ICD-10-CM | POA: Diagnosis not present

## 2014-11-03 DIAGNOSIS — G479 Sleep disorder, unspecified: Secondary | ICD-10-CM | POA: Diagnosis not present

## 2014-11-03 DIAGNOSIS — I5032 Chronic diastolic (congestive) heart failure: Secondary | ICD-10-CM | POA: Diagnosis not present

## 2014-11-03 DIAGNOSIS — D649 Anemia, unspecified: Secondary | ICD-10-CM | POA: Diagnosis not present

## 2014-11-03 DIAGNOSIS — G2581 Restless legs syndrome: Secondary | ICD-10-CM | POA: Diagnosis not present

## 2014-11-03 DIAGNOSIS — R319 Hematuria, unspecified: Secondary | ICD-10-CM | POA: Diagnosis not present

## 2014-11-08 DIAGNOSIS — G894 Chronic pain syndrome: Secondary | ICD-10-CM | POA: Diagnosis not present

## 2014-11-08 DIAGNOSIS — M549 Dorsalgia, unspecified: Secondary | ICD-10-CM | POA: Diagnosis not present

## 2014-11-08 DIAGNOSIS — M797 Fibromyalgia: Secondary | ICD-10-CM | POA: Diagnosis not present

## 2014-11-08 DIAGNOSIS — Z79899 Other long term (current) drug therapy: Secondary | ICD-10-CM | POA: Diagnosis not present

## 2014-11-08 DIAGNOSIS — M4697 Unspecified inflammatory spondylopathy, lumbosacral region: Secondary | ICD-10-CM | POA: Diagnosis not present

## 2014-11-16 DIAGNOSIS — F331 Major depressive disorder, recurrent, moderate: Secondary | ICD-10-CM | POA: Diagnosis not present

## 2014-11-16 DIAGNOSIS — F411 Generalized anxiety disorder: Secondary | ICD-10-CM | POA: Diagnosis not present

## 2014-11-20 DIAGNOSIS — M797 Fibromyalgia: Secondary | ICD-10-CM | POA: Diagnosis not present

## 2014-11-20 DIAGNOSIS — M25571 Pain in right ankle and joints of right foot: Secondary | ICD-10-CM | POA: Diagnosis not present

## 2014-11-20 DIAGNOSIS — M79641 Pain in right hand: Secondary | ICD-10-CM | POA: Diagnosis not present

## 2014-11-20 DIAGNOSIS — M069 Rheumatoid arthritis, unspecified: Secondary | ICD-10-CM | POA: Diagnosis not present

## 2014-12-11 DIAGNOSIS — M792 Neuralgia and neuritis, unspecified: Secondary | ICD-10-CM | POA: Diagnosis not present

## 2014-12-11 DIAGNOSIS — G894 Chronic pain syndrome: Secondary | ICD-10-CM | POA: Diagnosis not present

## 2014-12-11 DIAGNOSIS — M549 Dorsalgia, unspecified: Secondary | ICD-10-CM | POA: Diagnosis not present

## 2014-12-11 DIAGNOSIS — M797 Fibromyalgia: Secondary | ICD-10-CM | POA: Diagnosis not present

## 2014-12-11 DIAGNOSIS — M4697 Unspecified inflammatory spondylopathy, lumbosacral region: Secondary | ICD-10-CM | POA: Diagnosis not present

## 2014-12-11 DIAGNOSIS — Z79899 Other long term (current) drug therapy: Secondary | ICD-10-CM | POA: Diagnosis not present

## 2014-12-18 DIAGNOSIS — M1710 Unilateral primary osteoarthritis, unspecified knee: Secondary | ICD-10-CM | POA: Diagnosis not present

## 2014-12-18 DIAGNOSIS — M0579 Rheumatoid arthritis with rheumatoid factor of multiple sites without organ or systems involvement: Secondary | ICD-10-CM | POA: Diagnosis not present

## 2014-12-18 DIAGNOSIS — G894 Chronic pain syndrome: Secondary | ICD-10-CM | POA: Diagnosis not present

## 2014-12-28 DIAGNOSIS — E559 Vitamin D deficiency, unspecified: Secondary | ICD-10-CM | POA: Diagnosis not present

## 2014-12-28 DIAGNOSIS — F322 Major depressive disorder, single episode, severe without psychotic features: Secondary | ICD-10-CM | POA: Diagnosis not present

## 2014-12-28 DIAGNOSIS — K219 Gastro-esophageal reflux disease without esophagitis: Secondary | ICD-10-CM | POA: Diagnosis not present

## 2014-12-28 DIAGNOSIS — G2581 Restless legs syndrome: Secondary | ICD-10-CM | POA: Diagnosis not present

## 2014-12-28 DIAGNOSIS — Z79899 Other long term (current) drug therapy: Secondary | ICD-10-CM | POA: Diagnosis not present

## 2014-12-28 DIAGNOSIS — Z0000 Encounter for general adult medical examination without abnormal findings: Secondary | ICD-10-CM | POA: Diagnosis not present

## 2014-12-28 DIAGNOSIS — E1122 Type 2 diabetes mellitus with diabetic chronic kidney disease: Secondary | ICD-10-CM | POA: Diagnosis not present

## 2014-12-28 DIAGNOSIS — R112 Nausea with vomiting, unspecified: Secondary | ICD-10-CM | POA: Diagnosis not present

## 2015-01-17 DIAGNOSIS — G894 Chronic pain syndrome: Secondary | ICD-10-CM | POA: Diagnosis not present

## 2015-01-17 DIAGNOSIS — M0579 Rheumatoid arthritis with rheumatoid factor of multiple sites without organ or systems involvement: Secondary | ICD-10-CM | POA: Diagnosis not present

## 2015-01-17 DIAGNOSIS — M1710 Unilateral primary osteoarthritis, unspecified knee: Secondary | ICD-10-CM | POA: Diagnosis not present

## 2015-01-23 DIAGNOSIS — F411 Generalized anxiety disorder: Secondary | ICD-10-CM | POA: Diagnosis not present

## 2015-01-23 DIAGNOSIS — F331 Major depressive disorder, recurrent, moderate: Secondary | ICD-10-CM | POA: Diagnosis not present

## 2015-03-19 ENCOUNTER — Other Ambulatory Visit: Payer: Self-pay

## 2015-03-19 DIAGNOSIS — M545 Low back pain: Secondary | ICD-10-CM | POA: Diagnosis not present

## 2015-03-19 DIAGNOSIS — G894 Chronic pain syndrome: Secondary | ICD-10-CM | POA: Diagnosis not present

## 2015-03-19 DIAGNOSIS — Z1231 Encounter for screening mammogram for malignant neoplasm of breast: Secondary | ICD-10-CM

## 2015-03-19 DIAGNOSIS — M1710 Unilateral primary osteoarthritis, unspecified knee: Secondary | ICD-10-CM | POA: Diagnosis not present

## 2015-03-19 DIAGNOSIS — M75102 Unspecified rotator cuff tear or rupture of left shoulder, not specified as traumatic: Secondary | ICD-10-CM | POA: Diagnosis not present

## 2015-03-19 DIAGNOSIS — M75100 Unspecified rotator cuff tear or rupture of unspecified shoulder, not specified as traumatic: Secondary | ICD-10-CM | POA: Diagnosis not present

## 2015-03-21 DIAGNOSIS — F411 Generalized anxiety disorder: Secondary | ICD-10-CM | POA: Diagnosis not present

## 2015-03-21 DIAGNOSIS — F331 Major depressive disorder, recurrent, moderate: Secondary | ICD-10-CM | POA: Diagnosis not present

## 2015-03-22 DIAGNOSIS — G2581 Restless legs syndrome: Secondary | ICD-10-CM | POA: Diagnosis not present

## 2015-03-22 DIAGNOSIS — I10 Essential (primary) hypertension: Secondary | ICD-10-CM | POA: Diagnosis not present

## 2015-03-22 DIAGNOSIS — Z23 Encounter for immunization: Secondary | ICD-10-CM | POA: Diagnosis not present

## 2015-04-30 ENCOUNTER — Ambulatory Visit
Admission: RE | Admit: 2015-04-30 | Discharge: 2015-04-30 | Disposition: A | Discharge status: Home / Self Care | Payer: Medicare Other | Source: Ambulatory Visit

## 2015-04-30 DIAGNOSIS — Z1231 Encounter for screening mammogram for malignant neoplasm of breast: Secondary | ICD-10-CM | POA: Diagnosis not present

## 2015-05-07 DIAGNOSIS — G4709 Other insomnia: Secondary | ICD-10-CM | POA: Diagnosis not present

## 2015-05-07 DIAGNOSIS — R5381 Other malaise: Secondary | ICD-10-CM | POA: Diagnosis not present

## 2015-05-07 DIAGNOSIS — M0579 Rheumatoid arthritis with rheumatoid factor of multiple sites without organ or systems involvement: Secondary | ICD-10-CM | POA: Diagnosis not present

## 2015-05-07 DIAGNOSIS — M797 Fibromyalgia: Secondary | ICD-10-CM | POA: Diagnosis not present

## 2015-05-16 DIAGNOSIS — M1710 Unilateral primary osteoarthritis, unspecified knee: Secondary | ICD-10-CM | POA: Diagnosis not present

## 2015-05-16 DIAGNOSIS — M7542 Impingement syndrome of left shoulder: Secondary | ICD-10-CM | POA: Diagnosis not present

## 2015-05-16 DIAGNOSIS — G894 Chronic pain syndrome: Secondary | ICD-10-CM | POA: Diagnosis not present

## 2015-05-21 DIAGNOSIS — E1122 Type 2 diabetes mellitus with diabetic chronic kidney disease: Secondary | ICD-10-CM | POA: Diagnosis not present

## 2015-05-21 DIAGNOSIS — I10 Essential (primary) hypertension: Secondary | ICD-10-CM | POA: Diagnosis not present

## 2015-06-11 DIAGNOSIS — M19012 Primary osteoarthritis, left shoulder: Secondary | ICD-10-CM | POA: Diagnosis not present

## 2015-06-11 DIAGNOSIS — M25522 Pain in left elbow: Secondary | ICD-10-CM | POA: Diagnosis not present

## 2015-06-11 DIAGNOSIS — M0579 Rheumatoid arthritis with rheumatoid factor of multiple sites without organ or systems involvement: Secondary | ICD-10-CM | POA: Diagnosis not present

## 2015-06-19 DIAGNOSIS — F331 Major depressive disorder, recurrent, moderate: Secondary | ICD-10-CM | POA: Diagnosis not present

## 2015-06-19 DIAGNOSIS — F411 Generalized anxiety disorder: Secondary | ICD-10-CM | POA: Diagnosis not present

## 2015-06-22 DIAGNOSIS — M19012 Primary osteoarthritis, left shoulder: Secondary | ICD-10-CM | POA: Diagnosis not present

## 2015-07-11 ENCOUNTER — Other Ambulatory Visit: Payer: Self-pay | Admitting: Orthopedic Surgery

## 2015-07-11 DIAGNOSIS — M19012 Primary osteoarthritis, left shoulder: Secondary | ICD-10-CM

## 2015-07-12 ENCOUNTER — Other Ambulatory Visit: Payer: Self-pay | Admitting: Orthopedic Surgery

## 2015-07-20 ENCOUNTER — Ambulatory Visit
Admission: RE | Admit: 2015-07-20 | Discharge: 2015-07-20 | Disposition: A | Discharge status: Home / Self Care | Payer: Medicare Other | Source: Ambulatory Visit | Attending: Orthopedic Surgery | Admitting: Orthopedic Surgery

## 2015-07-20 DIAGNOSIS — M19012 Primary osteoarthritis, left shoulder: Secondary | ICD-10-CM | POA: Diagnosis not present

## 2015-07-25 ENCOUNTER — Inpatient Hospital Stay (HOSPITAL_COMMUNITY): Admission: RE | Admit: 2015-07-25 | Payer: Self-pay | Source: Ambulatory Visit

## 2015-07-27 DIAGNOSIS — E119 Type 2 diabetes mellitus without complications: Secondary | ICD-10-CM | POA: Diagnosis not present

## 2015-07-27 DIAGNOSIS — Z79899 Other long term (current) drug therapy: Secondary | ICD-10-CM | POA: Diagnosis not present

## 2015-08-06 DIAGNOSIS — S0993XA Unspecified injury of face, initial encounter: Secondary | ICD-10-CM | POA: Diagnosis not present

## 2015-08-06 DIAGNOSIS — Z23 Encounter for immunization: Secondary | ICD-10-CM | POA: Diagnosis not present

## 2015-08-06 DIAGNOSIS — S01511A Laceration without foreign body of lip, initial encounter: Secondary | ICD-10-CM | POA: Diagnosis not present

## 2015-08-06 DIAGNOSIS — W19XXXA Unspecified fall, initial encounter: Secondary | ICD-10-CM | POA: Diagnosis not present

## 2015-08-06 DIAGNOSIS — T148 Other injury of unspecified body region: Secondary | ICD-10-CM | POA: Diagnosis not present

## 2015-08-13 DIAGNOSIS — M19012 Primary osteoarthritis, left shoulder: Secondary | ICD-10-CM | POA: Diagnosis not present

## 2015-08-13 DIAGNOSIS — G894 Chronic pain syndrome: Secondary | ICD-10-CM | POA: Diagnosis not present

## 2015-08-13 DIAGNOSIS — M1710 Unilateral primary osteoarthritis, unspecified knee: Secondary | ICD-10-CM | POA: Diagnosis not present

## 2015-08-15 DIAGNOSIS — R600 Localized edema: Secondary | ICD-10-CM | POA: Diagnosis not present

## 2015-08-15 DIAGNOSIS — R5383 Other fatigue: Secondary | ICD-10-CM | POA: Diagnosis not present

## 2015-08-15 DIAGNOSIS — R0602 Shortness of breath: Secondary | ICD-10-CM | POA: Diagnosis not present

## 2015-08-15 DIAGNOSIS — N61 Mastitis without abscess: Secondary | ICD-10-CM | POA: Diagnosis not present

## 2015-08-15 DIAGNOSIS — M7989 Other specified soft tissue disorders: Secondary | ICD-10-CM | POA: Diagnosis not present

## 2015-08-22 DIAGNOSIS — L03119 Cellulitis of unspecified part of limb: Secondary | ICD-10-CM | POA: Diagnosis not present

## 2015-09-05 DIAGNOSIS — F331 Major depressive disorder, recurrent, moderate: Secondary | ICD-10-CM | POA: Diagnosis not present

## 2015-09-05 DIAGNOSIS — F411 Generalized anxiety disorder: Secondary | ICD-10-CM | POA: Diagnosis not present

## 2015-09-14 NOTE — Pre-Procedure Instructions (Signed)
Jasmine Huff  09/14/2015      [facility] - [place], [place] - [address] [place] [address] Phone: [phone] Fax: [phone]  [place], [place] [address] Phone: [phone] Fax: [phone]    Your procedure is scheduled on Thurs, Mar 23 @ 7:30 AM  Report to [facility] Admitting at 5:30 AM  Call this number if you have problems the morning of surgery:  [phone]   Remember:  Do not eat food or drink liquids after midnight.  Take these medicines the morning of surgery with A SIP OF WATER Alprazolam(Xanax),Wellbutrin(Bupropion),Lexapro(Escitalopram),Latuda(Lurasidone), and Pain Pill(if needed)              Stop taking your Naproxen. No Goody's,BC's,Aspirin,Ibuprofen,Motrin,Advil,Fish Oil,or any Herbal Medications.    Do not wear jewelry, make-up or nail polish.  Do not wear lotions, powders, or perfumes.  You may wear deodorant.  Do not shave 48 hours prior to surgery.     is not responsible for any belongings or valuables.  Contacts, dentures or bridgework may not be worn into surgery.  Leave your suitcase in the car.  After surgery it may be brought to your room.  For patients admitted to the hospital, discharge time will be determined by your treatment team.  Patients discharged the day of surgery will not be allowed to drive home.   Special instructions:   - Preparing for Surgery  Before surgery, you can play an important role.  Because skin is not sterile, your skin needs to be as free of germs as possible.  You can reduce the number of germs on you skin by washing with CHG (chlorahexidine gluconate) soap before surgery.  CHG is an antiseptic cleaner which kills germs and bonds with the skin to continue killing germs even after washing.  Please DO NOT use if you have an allergy to CHG or antibacterial soaps.  If your skin  becomes reddened/irritated stop using the CHG and inform your nurse when you arrive at Short Stay.  Do not shave (including legs and underarms) for at least 48 hours prior to the first CHG shower.  You may shave your face.  Please follow these instructions carefully:   1.  Shower with CHG Soap the night before surgery and the                                morning of Surgery.  2.  If you choose to wash your hair, wash your hair first as usual with your       normal shampoo.  3.  After you shampoo, rinse your hair and body thoroughly to remove the                      Shampoo.  4.  Use CHG as you would any other liquid soap.  You can apply chg directly       to the skin and wash gently with scrungie or a clean washcloth.  5.  Apply the CHG Soap to your body ONLY FROM THE NECK DOWN.        Do not use on open wounds or open sores.  Avoid contact with your eyes,       ears, mouth and genitals (private parts).  Wash genitals (private parts)  with your normal soap.  6.  Wash thoroughly, paying special attention to the area where your surgery        will be performed.  7.  Thoroughly rinse your body with warm water from the neck down.  8.  DO NOT shower/wash with your normal soap after using and rinsing off       the CHG Soap.  9.  Pat yourself dry with a clean towel.            10.  Wear clean pajamas.            11.  Place clean sheets on your bed the night of your first shower and do not        sleep with pets.  Day of Surgery  Do not apply any lotions/deoderants the morning of surgery.  Please wear clean clothes to the hospital/surgery center.    Please read over the following fact sheets that you were given. Pain Booklet, Coughing and Deep Breathing, MRSA Information and Surgical Site Infection Prevention

## 2015-09-17 ENCOUNTER — Encounter (HOSPITAL_COMMUNITY)
Admission: RE | Admit: 2015-09-17 | Discharge: 2015-09-17 | Disposition: A | Discharge status: Home / Self Care | Payer: Medicare Other | Source: Ambulatory Visit | Attending: Orthopedic Surgery | Admitting: Orthopedic Surgery

## 2015-09-17 ENCOUNTER — Encounter (HOSPITAL_COMMUNITY): Payer: Self-pay

## 2015-09-17 DIAGNOSIS — M19012 Primary osteoarthritis, left shoulder: Secondary | ICD-10-CM | POA: Insufficient documentation

## 2015-09-17 DIAGNOSIS — I4510 Unspecified right bundle-branch block: Secondary | ICD-10-CM | POA: Insufficient documentation

## 2015-09-17 DIAGNOSIS — Z01818 Encounter for other preprocedural examination: Secondary | ICD-10-CM | POA: Insufficient documentation

## 2015-09-17 DIAGNOSIS — R000 Tachycardia, unspecified: Secondary | ICD-10-CM | POA: Insufficient documentation

## 2015-09-17 DIAGNOSIS — Z79899 Other long term (current) drug therapy: Secondary | ICD-10-CM | POA: Diagnosis not present

## 2015-09-17 DIAGNOSIS — K219 Gastro-esophageal reflux disease without esophagitis: Secondary | ICD-10-CM | POA: Diagnosis not present

## 2015-09-17 DIAGNOSIS — I10 Essential (primary) hypertension: Secondary | ICD-10-CM | POA: Insufficient documentation

## 2015-09-17 DIAGNOSIS — G4733 Obstructive sleep apnea (adult) (pediatric): Secondary | ICD-10-CM | POA: Insufficient documentation

## 2015-09-17 DIAGNOSIS — Z01812 Encounter for preprocedural laboratory examination: Secondary | ICD-10-CM | POA: Insufficient documentation

## 2015-09-17 HISTORY — DX: Personal history of other diseases of the nervous system and sense organs: Z86.69

## 2015-09-17 HISTORY — DX: Other chronic pain: G89.29

## 2015-09-17 HISTORY — DX: Dorsalgia, unspecified: M54.9

## 2015-09-17 HISTORY — DX: Insomnia, unspecified: G47.00

## 2015-09-17 HISTORY — DX: Weakness: R53.1

## 2015-09-17 HISTORY — DX: Restless legs syndrome: G25.81

## 2015-09-17 HISTORY — DX: Urgency of urination: R39.15

## 2015-09-17 HISTORY — DX: Pain in unspecified joint: M25.50

## 2015-09-17 HISTORY — DX: Frequency of micturition: R35.0

## 2015-09-17 HISTORY — DX: Reserved for inherently not codable concepts without codable children: IMO0001

## 2015-09-17 HISTORY — DX: Nocturia: R35.1

## 2015-09-17 LAB — COMPREHENSIVE METABOLIC PANEL
ALBUMIN: 3.4 g/dL — AB (ref 3.5–5.0)
ALK PHOS: 95 U/L (ref 38–126)
ALT: 18 U/L (ref 14–54)
ANION GAP: 9 (ref 5–15)
AST: 25 U/L (ref 15–41)
BILIRUBIN TOTAL: 0.4 mg/dL (ref 0.3–1.2)
BUN: 10 mg/dL (ref 6–20)
CALCIUM: 9 mg/dL (ref 8.9–10.3)
CO2: 28 mmol/L (ref 22–32)
CREATININE: 0.73 mg/dL (ref 0.44–1.00)
Chloride: 104 mmol/L (ref 101–111)
GFR calc non Af Amer: >60 mL/min (ref >60)
GLUCOSE: 151 mg/dL — AB (ref 65–99)
Potassium: 4.3 mmol/L (ref 3.5–5.1)
Sodium: 141 mmol/L (ref 135–145)
TOTAL PROTEIN: 7.2 g/dL (ref 6.5–8.1)

## 2015-09-17 LAB — CBC WITH DIFFERENTIAL/PLATELET
Basophils Absolute: 0.1 10*3/uL (ref 0.0–0.1)
Basophils Relative: 1 %
Eosinophils Absolute: 0.1 10*3/uL (ref 0.0–0.7)
Eosinophils Relative: 1 %
HEMATOCRIT: 37.5 % (ref 36.0–46.0)
HEMOGLOBIN: 11.2 g/dL — AB (ref 12.0–15.0)
LYMPHS ABS: 1.3 10*3/uL (ref 0.7–4.0)
Lymphocytes Relative: 15 %
MCH: 26.4 pg (ref 26.0–34.0)
MCHC: 29.9 g/dL — AB (ref 30.0–36.0)
MCV: 88.4 fL (ref 78.0–100.0)
MONOS PCT: 7 %
Monocytes Absolute: 0.6 10*3/uL (ref 0.1–1.0)
NEUTROS ABS: 6.6 10*3/uL (ref 1.7–7.7)
NEUTROS PCT: 76 %
Platelets: 417 10*3/uL — ABNORMAL HIGH (ref 150–400)
RBC: 4.24 MIL/uL (ref 3.87–5.11)
RDW: 15 % (ref 11.5–15.5)
WBC: 8.8 10*3/uL (ref 4.0–10.5)

## 2015-09-17 LAB — SURGICAL PCR SCREEN
MRSA, PCR: POSITIVE — AB
STAPHYLOCOCCUS AUREUS: POSITIVE — AB

## 2015-09-17 LAB — APTT: aPTT: 29 seconds (ref 24–37)

## 2015-09-17 LAB — PROTIME-INR
INR: 1.07 (ref 0.00–1.49)
Prothrombin Time: 14.1 seconds (ref 11.6–15.2)

## 2015-09-17 MED ORDER — POVIDONE-IODINE 7.5 % EX SOLN: Freq: Once | CUTANEOUS | Status: DC

## 2015-09-17 NOTE — Progress Notes (Addendum)
Cardiologist denies  Medical Md is Dr.Sharon Stephanie Acre  Echo reports in epic from 2013/2014  Stress test denies  Heart cath denies  EKG denies in past yr  CXR denies in past yr

## 2015-09-17 NOTE — Progress Notes (Signed)
Mupirocin script called into the [facility]

## 2015-09-18 NOTE — Progress Notes (Signed)
Anesthesia Chart Review:  Pt is a 60 year old female scheduled for L total shoulder arthroplasty on 09/27/2015 with Dr. Tamera Punt.   PMH includes:  HTN, OSA, anemia, GERD. Never smoker. BMI 64. S/p colostomy reversal 02/17/13. S/p exploratory laparotomy, colostomy 04/04/12.   Medications include: hydroxychloroquine, losartan, prilosec.   Preoperative labs reviewed.    Chest x-ray 09/17/15 reviewed.  1. Stable moderate to marked cardiomegaly. Minimal linear atelectasis in the right lower lobe. No acute cardiopulmonary disease otherwise. 2. 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  EKG 09/17/15: sinus tachycardia (104 bpm). RBBB.   Echo 09/14/12:  - Left ventricle: The cavity size was normal. Wall thickness was increased in a pattern of mild LVH. The estimated ejection fraction was 60%. Wall motion was normal; there were no regional wall motion abnormalities. Findings consistent with left ventricular diastolic dysfunction. - Right ventricle: The cavity size was normal. Systolic function was normal.  If no changes, I anticipate pt can proceed with surgery as scheduled.   Willeen Cass, FNP-BC [facility]/Anesthesiology Phone: [phone] 09/18/2015 2:49 PM

## 2015-09-19 ENCOUNTER — Other Ambulatory Visit: Payer: Self-pay | Admitting: Orthopedic Surgery

## 2015-09-26 MED ORDER — VANCOMYCIN HCL IN DEXTROSE 1-5 GM/200ML-% IV SOLN
1000.0000 mg | Freq: Once | INTRAVENOUS | Status: AC
  Administered 2015-09-27: 1000 mg via INTRAVENOUS
  Filled 2015-09-26: qty 200

## 2015-09-26 MED ORDER — CEFAZOLIN SODIUM-DEXTROSE 2-4 GM/100ML-% IV SOLN
2.0000 g | INTRAVENOUS | Status: AC
  Administered 2015-09-27: 2 g via INTRAVENOUS
  Filled 2015-09-26 (×2): qty 100

## 2015-09-27 ENCOUNTER — Inpatient Hospital Stay (HOSPITAL_COMMUNITY)
Admission: RE | Admit: 2015-09-27 | Discharge: 2015-09-28 | DRG: 483 | Disposition: A | Discharge status: Home Health | Payer: Medicare Other | Source: Ambulatory Visit | Attending: Orthopedic Surgery | Admitting: Orthopedic Surgery

## 2015-09-27 ENCOUNTER — Inpatient Hospital Stay (HOSPITAL_COMMUNITY): Payer: Medicare Other

## 2015-09-27 ENCOUNTER — Encounter (HOSPITAL_COMMUNITY)
Admission: RE | Disposition: A | Discharge status: Home Health | Payer: Self-pay | Source: Ambulatory Visit | Attending: Orthopedic Surgery

## 2015-09-27 ENCOUNTER — Inpatient Hospital Stay (HOSPITAL_COMMUNITY): Payer: Medicare Other | Admitting: Anesthesiology

## 2015-09-27 ENCOUNTER — Inpatient Hospital Stay (HOSPITAL_COMMUNITY): Payer: Medicare Other | Admitting: Emergency Medicine

## 2015-09-27 DIAGNOSIS — G43909 Migraine, unspecified, not intractable, without status migrainosus: Secondary | ICD-10-CM | POA: Diagnosis present

## 2015-09-27 DIAGNOSIS — G894 Chronic pain syndrome: Secondary | ICD-10-CM | POA: Diagnosis present

## 2015-09-27 DIAGNOSIS — G4730 Sleep apnea, unspecified: Secondary | ICD-10-CM | POA: Diagnosis present

## 2015-09-27 DIAGNOSIS — I10 Essential (primary) hypertension: Secondary | ICD-10-CM | POA: Diagnosis present

## 2015-09-27 DIAGNOSIS — F329 Major depressive disorder, single episode, unspecified: Secondary | ICD-10-CM | POA: Diagnosis present

## 2015-09-27 DIAGNOSIS — Z471 Aftercare following joint replacement surgery: Secondary | ICD-10-CM | POA: Diagnosis not present

## 2015-09-27 DIAGNOSIS — Z6841 Body Mass Index (BMI) 40.0 and over, adult: Secondary | ICD-10-CM | POA: Diagnosis not present

## 2015-09-27 DIAGNOSIS — M069 Rheumatoid arthritis, unspecified: Secondary | ICD-10-CM | POA: Diagnosis present

## 2015-09-27 DIAGNOSIS — Z96619 Presence of unspecified artificial shoulder joint: Secondary | ICD-10-CM

## 2015-09-27 DIAGNOSIS — M797 Fibromyalgia: Secondary | ICD-10-CM | POA: Diagnosis present

## 2015-09-27 DIAGNOSIS — K219 Gastro-esophageal reflux disease without esophagitis: Secondary | ICD-10-CM | POA: Diagnosis present

## 2015-09-27 DIAGNOSIS — G2581 Restless legs syndrome: Secondary | ICD-10-CM | POA: Diagnosis present

## 2015-09-27 DIAGNOSIS — M19012 Primary osteoarthritis, left shoulder: Secondary | ICD-10-CM | POA: Diagnosis present

## 2015-09-27 DIAGNOSIS — Z96612 Presence of left artificial shoulder joint: Secondary | ICD-10-CM

## 2015-09-27 DIAGNOSIS — Z888 Allergy status to other drugs, medicaments and biological substances status: Secondary | ICD-10-CM | POA: Diagnosis not present

## 2015-09-27 DIAGNOSIS — Z79899 Other long term (current) drug therapy: Secondary | ICD-10-CM

## 2015-09-27 DIAGNOSIS — G8918 Other acute postprocedural pain: Secondary | ICD-10-CM | POA: Diagnosis not present

## 2015-09-27 HISTORY — PX: TOTAL SHOULDER ARTHROPLASTY: SHX126

## 2015-09-27 LAB — URINALYSIS, ROUTINE W REFLEX MICROSCOPIC
BILIRUBIN URINE: NEGATIVE
Glucose, UA: NEGATIVE mg/dL
HGB URINE DIPSTICK: NEGATIVE
Ketones, ur: NEGATIVE mg/dL
Leukocytes, UA: NEGATIVE
Nitrite: NEGATIVE
PROTEIN: NEGATIVE mg/dL
Specific Gravity, Urine: 1.02 (ref 1.005–1.030)
pH: 5.5 (ref 5.0–8.0)

## 2015-09-27 SURGERY — ARTHROPLASTY, SHOULDER, TOTAL
Anesthesia: General | Site: Shoulder | Laterality: Left

## 2015-09-27 MED ORDER — ROCURONIUM BROMIDE 50 MG/5ML IV SOLN
INTRAVENOUS | Status: AC
  Filled 2015-09-27: qty 1

## 2015-09-27 MED ORDER — MIDAZOLAM HCL 2 MG/2ML IJ SOLN
INTRAMUSCULAR | Status: AC
  Filled 2015-09-27: qty 2

## 2015-09-27 MED ORDER — PHENYLEPHRINE HCL 10 MG/ML IJ SOLN
10.0000 mg | INTRAVENOUS | Status: DC | PRN
  Administered 2015-09-27: 50 ug/min via INTRAVENOUS

## 2015-09-27 MED ORDER — SODIUM CHLORIDE 0.9 % IJ SOLN
INTRAMUSCULAR | Status: AC
  Filled 2015-09-27: qty 10

## 2015-09-27 MED ORDER — ONDANSETRON HCL 4 MG/2ML IJ SOLN
INTRAMUSCULAR | Status: DC | PRN
  Administered 2015-09-27: 4 mg via INTRAVENOUS

## 2015-09-27 MED ORDER — SODIUM CHLORIDE 0.9 % IR SOLN
Status: DC | PRN
  Administered 2015-09-27: 3000 mL

## 2015-09-27 MED ORDER — MEPERIDINE HCL 25 MG/ML IJ SOLN: 6.2500 mg | INTRAMUSCULAR | Status: DC | PRN

## 2015-09-27 MED ORDER — 0.9 % SODIUM CHLORIDE (POUR BTL) OPTIME
TOPICAL | Status: DC | PRN
  Administered 2015-09-27: 1000 mL

## 2015-09-27 MED ORDER — EPHEDRINE SULFATE 50 MG/ML IJ SOLN
INTRAMUSCULAR | Status: AC
  Filled 2015-09-27: qty 1

## 2015-09-27 MED ORDER — SODIUM CHLORIDE 0.9 % IV SOLN
INTRAVENOUS | Status: DC
  Administered 2015-09-27 – 2015-09-28 (×2): via INTRAVENOUS

## 2015-09-27 MED ORDER — HYDROXYCHLOROQUINE SULFATE 200 MG PO TABS
100.0000 mg | ORAL_TABLET | Freq: Two times a day (BID) | ORAL | Status: DC
  Administered 2015-09-27 – 2015-09-28 (×2): 200 mg via ORAL
  Filled 2015-09-27 (×3): qty 1

## 2015-09-27 MED ORDER — FENTANYL CITRATE (PF) 100 MCG/2ML IJ SOLN
INTRAMUSCULAR | Status: AC
  Filled 2015-09-27: qty 2

## 2015-09-27 MED ORDER — SUCCINYLCHOLINE CHLORIDE 20 MG/ML IJ SOLN
INTRAMUSCULAR | Status: DC | PRN
  Administered 2015-09-27: 200 mg via INTRAVENOUS

## 2015-09-27 MED ORDER — ALPRAZOLAM 0.5 MG PO TABS
0.5000 mg | ORAL_TABLET | Freq: Two times a day (BID) | ORAL | Status: DC | PRN
Start: 2015-09-27 — End: 2015-09-28
  Administered 2015-09-27 – 2015-09-28 (×2): 1 mg via ORAL
  Filled 2015-09-27 (×3): qty 2

## 2015-09-27 MED ORDER — DOCUSATE SODIUM 100 MG PO CAPS
100.0000 mg | ORAL_CAPSULE | Freq: Two times a day (BID) | ORAL | Status: DC
  Administered 2015-09-27 – 2015-09-28 (×3): 100 mg via ORAL
  Filled 2015-09-27 (×3): qty 1

## 2015-09-27 MED ORDER — ROPINIROLE HCL 1 MG PO TABS
3.0000 mg | ORAL_TABLET | Freq: Every day | ORAL | Status: DC
  Administered 2015-09-27: 3 mg via ORAL
  Filled 2015-09-27: qty 3

## 2015-09-27 MED ORDER — SUGAMMADEX SODIUM 500 MG/5ML IV SOLN
INTRAVENOUS | Status: DC | PRN
  Administered 2015-09-27: 350 mg via INTRAVENOUS

## 2015-09-27 MED ORDER — ALUM & MAG HYDROXIDE-SIMETH 200-200-20 MG/5ML PO SUSP: 30.0000 mL | ORAL | Status: DC | PRN

## 2015-09-27 MED ORDER — ZOLPIDEM TARTRATE 5 MG PO TABS
5.0000 mg | ORAL_TABLET | Freq: Every evening | ORAL | Status: DC | PRN
  Administered 2015-09-27: 5 mg via ORAL
  Filled 2015-09-27: qty 1

## 2015-09-27 MED ORDER — PHENYLEPHRINE HCL 10 MG/ML IJ SOLN
INTRAMUSCULAR | Status: DC | PRN
  Administered 2015-09-27: 160 ug via INTRAVENOUS
  Administered 2015-09-27: 80 ug via INTRAVENOUS

## 2015-09-27 MED ORDER — PROPOFOL 10 MG/ML IV BOLUS
INTRAVENOUS | Status: AC
  Filled 2015-09-27: qty 20

## 2015-09-27 MED ORDER — CEFAZOLIN SODIUM-DEXTROSE 2-4 GM/100ML-% IV SOLN
2.0000 g | Freq: Four times a day (QID) | INTRAVENOUS | Status: AC
  Administered 2015-09-27 – 2015-09-28 (×3): 2 g via INTRAVENOUS
  Filled 2015-09-27 (×3): qty 100

## 2015-09-27 MED ORDER — LACTATED RINGERS IV SOLN
INTRAVENOUS | Status: DC | PRN
  Administered 2015-09-27 (×2): via INTRAVENOUS

## 2015-09-27 MED ORDER — MIDAZOLAM HCL 5 MG/5ML IJ SOLN
INTRAMUSCULAR | Status: DC | PRN
  Administered 2015-09-27: 2 mg via INTRAVENOUS

## 2015-09-27 MED ORDER — LIDOCAINE HCL (CARDIAC) 20 MG/ML IV SOLN
INTRAVENOUS | Status: DC | PRN
  Administered 2015-09-27: 70 mg via INTRAVENOUS

## 2015-09-27 MED ORDER — POLYETHYLENE GLYCOL 3350 17 G PO PACK
17.0000 g | PACK | Freq: Every day | ORAL | Status: DC | PRN
Start: 2015-09-27 — End: 2015-09-28

## 2015-09-27 MED ORDER — LIDOCAINE HCL (CARDIAC) 20 MG/ML IV SOLN
INTRAVENOUS | Status: AC
  Filled 2015-09-27: qty 5

## 2015-09-27 MED ORDER — VANCOMYCIN HCL IN DEXTROSE 1-5 GM/200ML-% IV SOLN
INTRAVENOUS | Status: AC
  Filled 2015-09-27: qty 200

## 2015-09-27 MED ORDER — HYDROMORPHONE HCL 1 MG/ML IJ SOLN
INTRAMUSCULAR | Status: AC
  Administered 2015-09-27: 0.5 mg via INTRAVENOUS
  Filled 2015-09-27: qty 1

## 2015-09-27 MED ORDER — PANTOPRAZOLE SODIUM 40 MG PO TBEC
80.0000 mg | DELAYED_RELEASE_TABLET | Freq: Every day | ORAL | Status: DC
  Administered 2015-09-27 – 2015-09-28 (×2): 80 mg via ORAL
  Filled 2015-09-27 (×3): qty 2

## 2015-09-27 MED ORDER — BUPROPION HCL ER (XL) 150 MG PO TB24
450.0000 mg | ORAL_TABLET | Freq: Every morning | ORAL | Status: DC
  Administered 2015-09-28: 450 mg via ORAL
  Filled 2015-09-27: qty 3

## 2015-09-27 MED ORDER — FLEET ENEMA 7-19 GM/118ML RE ENEM: 1.0000 | ENEMA | Freq: Once | RECTAL | Status: DC | PRN

## 2015-09-27 MED ORDER — SUGAMMADEX SODIUM 500 MG/5ML IV SOLN
INTRAVENOUS | Status: AC
  Filled 2015-09-27: qty 5

## 2015-09-27 MED ORDER — PHENOL 1.4 % MT LIQD: 1.0000 | OROMUCOSAL | Status: DC | PRN

## 2015-09-27 MED ORDER — BUPIVACAINE-EPINEPHRINE (PF) 0.5% -1:200000 IJ SOLN
INTRAMUSCULAR | Status: DC | PRN
  Administered 2015-09-27: 25 mL via PERINEURAL

## 2015-09-27 MED ORDER — TRANEXAMIC ACID 1000 MG/10ML IV SOLN
1000.0000 mg | INTRAVENOUS | Status: AC
  Administered 2015-09-27: 1000 mg via INTRAVENOUS
  Filled 2015-09-27: qty 10

## 2015-09-27 MED ORDER — LURASIDONE HCL 40 MG PO TABS
60.0000 mg | ORAL_TABLET | Freq: Two times a day (BID) | ORAL | Status: DC
  Administered 2015-09-27 – 2015-09-28 (×2): 60 mg via ORAL
  Filled 2015-09-27 (×4): qty 1

## 2015-09-27 MED ORDER — ASPIRIN EC 325 MG PO TBEC
325.0000 mg | DELAYED_RELEASE_TABLET | Freq: Two times a day (BID) | ORAL | Status: DC
  Administered 2015-09-27 – 2015-09-28 (×2): 325 mg via ORAL
  Filled 2015-09-27 (×2): qty 1

## 2015-09-27 MED ORDER — FENTANYL CITRATE (PF) 250 MCG/5ML IJ SOLN
INTRAMUSCULAR | Status: DC | PRN
  Administered 2015-09-27: 50 ug via INTRAVENOUS
  Administered 2015-09-27: 100 ug via INTRAVENOUS

## 2015-09-27 MED ORDER — ESCITALOPRAM OXALATE 10 MG PO TABS
40.0000 mg | ORAL_TABLET | Freq: Every morning | ORAL | Status: DC
  Administered 2015-09-28: 40 mg via ORAL
  Filled 2015-09-27 (×2): qty 4

## 2015-09-27 MED ORDER — OXYCODONE HCL 5 MG/5ML PO SOLN: 5.0000 mg | Freq: Once | ORAL | Status: DC | PRN

## 2015-09-27 MED ORDER — LURASIDONE HCL 60 MG PO TABS: 60.0000 mg | ORAL_TABLET | Freq: Two times a day (BID) | ORAL | Status: DC

## 2015-09-27 MED ORDER — BISACODYL 5 MG PO TBEC: 5.0000 mg | DELAYED_RELEASE_TABLET | Freq: Every day | ORAL | Status: DC | PRN

## 2015-09-27 MED ORDER — HYDROXYZINE HCL 25 MG PO TABS: 25.0000 mg | ORAL_TABLET | Freq: Three times a day (TID) | ORAL | Status: DC | PRN

## 2015-09-27 MED ORDER — OXYCODONE HCL 5 MG PO TABS
10.0000 mg | ORAL_TABLET | Freq: Three times a day (TID) | ORAL | Status: DC | PRN
  Administered 2015-09-27 – 2015-09-28 (×3): 10 mg via ORAL
  Filled 2015-09-27 (×3): qty 2

## 2015-09-27 MED ORDER — FENTANYL CITRATE (PF) 250 MCG/5ML IJ SOLN
INTRAMUSCULAR | Status: AC
  Filled 2015-09-27: qty 5

## 2015-09-27 MED ORDER — DIPHENHYDRAMINE HCL 12.5 MG/5ML PO ELIX: 12.5000 mg | ORAL_SOLUTION | ORAL | Status: DC | PRN

## 2015-09-27 MED ORDER — MORPHINE SULFATE ER 15 MG PO TBCR
15.0000 mg | EXTENDED_RELEASE_TABLET | Freq: Two times a day (BID) | ORAL | Status: DC
  Administered 2015-09-27 – 2015-09-28 (×3): 15 mg via ORAL
  Filled 2015-09-27 (×3): qty 1

## 2015-09-27 MED ORDER — MORPHINE SULFATE (PF) 2 MG/ML IV SOLN
1.0000 mg | INTRAVENOUS | Status: DC | PRN
  Administered 2015-09-27 – 2015-09-28 (×5): 1 mg via INTRAVENOUS
  Filled 2015-09-27 (×6): qty 1

## 2015-09-27 MED ORDER — LOSARTAN POTASSIUM 50 MG PO TABS
100.0000 mg | ORAL_TABLET | Freq: Every day | ORAL | Status: DC
Start: 2015-09-28 — End: 2015-09-28
  Administered 2015-09-28: 100 mg via ORAL
  Filled 2015-09-27: qty 2

## 2015-09-27 MED ORDER — PHENYLEPHRINE 40 MCG/ML (10ML) SYRINGE FOR IV PUSH (FOR BLOOD PRESSURE SUPPORT)
PREFILLED_SYRINGE | INTRAVENOUS | Status: AC
  Filled 2015-09-27: qty 10

## 2015-09-27 MED ORDER — ONDANSETRON HCL 4 MG/2ML IJ SOLN
INTRAMUSCULAR | Status: AC
  Filled 2015-09-27: qty 2

## 2015-09-27 MED ORDER — HYDROMORPHONE HCL 1 MG/ML IJ SOLN
0.2500 mg | INTRAMUSCULAR | Status: DC | PRN
Start: 2015-09-27 — End: 2015-09-27
  Administered 2015-09-27 (×4): 0.5 mg via INTRAVENOUS

## 2015-09-27 MED ORDER — SUCCINYLCHOLINE CHLORIDE 20 MG/ML IJ SOLN
INTRAMUSCULAR | Status: AC
  Filled 2015-09-27: qty 1

## 2015-09-27 MED ORDER — MENTHOL 3 MG MT LOZG: 1.0000 | LOZENGE | OROMUCOSAL | Status: DC | PRN

## 2015-09-27 MED ORDER — ROCURONIUM BROMIDE 100 MG/10ML IV SOLN
INTRAVENOUS | Status: DC | PRN
  Administered 2015-09-27: 50 mg via INTRAVENOUS
  Administered 2015-09-27: 20 mg via INTRAVENOUS

## 2015-09-27 MED ORDER — OXYCODONE HCL 5 MG PO TABS
ORAL_TABLET | ORAL | Status: AC
  Administered 2015-09-27: 10 mg via ORAL
  Filled 2015-09-27: qty 2

## 2015-09-27 MED ORDER — ACETAMINOPHEN 500 MG PO TABS
1000.0000 mg | ORAL_TABLET | Freq: Four times a day (QID) | ORAL | Status: AC
  Administered 2015-09-27 – 2015-09-28 (×4): 1000 mg via ORAL
  Filled 2015-09-27 (×4): qty 2

## 2015-09-27 MED ORDER — PROPOFOL 10 MG/ML IV BOLUS
INTRAVENOUS | Status: DC | PRN
  Administered 2015-09-27: 200 mg via INTRAVENOUS

## 2015-09-27 MED ORDER — OXYCODONE HCL 5 MG PO TABS: 5.0000 mg | ORAL_TABLET | Freq: Once | ORAL | Status: DC | PRN

## 2015-09-27 SURGICAL SUPPLY — 68 items
BIT DRILL 5/64X5 DISP (BIT) IMPLANT
BLADE SAW SAG 73X25 THK (BLADE) ×2
BLADE SAW SGTL 73X25 THK (BLADE) ×1 IMPLANT
BLADE SURG 15 STRL LF DISP TIS (BLADE) ×1 IMPLANT
BLADE SURG 15 STRL SS (BLADE) ×2
BROCHE GUIDE GUIEDE WIRE ×3 IMPLANT
CAP SHOULDER TOTAL 2 ×3 IMPLANT
CEMENT BONE DEPUY (Cement) ×3 IMPLANT
CHLORAPREP W/TINT 26ML (MISCELLANEOUS) ×6 IMPLANT
CLOSURE WOUND 1/2 X4 (GAUZE/BANDAGES/DRESSINGS) ×1
COVER SURGICAL LIGHT HANDLE (MISCELLANEOUS) ×3 IMPLANT
DRAPE INCISE IOBAN 66X45 STRL (DRAPES) ×3 IMPLANT
DRAPE ORTHO SPLIT 77X108 STRL (DRAPES) ×4
DRAPE SURG 17X23 STRL (DRAPES) ×3 IMPLANT
DRAPE SURG ORHT 6 SPLT 77X108 (DRAPES) ×2 IMPLANT
DRAPE U-SHAPE 47X51 STRL (DRAPES) ×3 IMPLANT
DRSG AQUACEL AG ADV 3.5X10 (GAUZE/BANDAGES/DRESSINGS) ×3 IMPLANT
ELECT BLADE 4.0 EZ CLEAN MEGAD (MISCELLANEOUS)
ELECT REM PT RETURN 9FT ADLT (ELECTROSURGICAL) ×3
ELECTRODE BLDE 4.0 EZ CLN MEGD (MISCELLANEOUS) IMPLANT
ELECTRODE REM PT RTRN 9FT ADLT (ELECTROSURGICAL) ×1 IMPLANT
EVACUATOR 1/8 PVC DRAIN (DRAIN) IMPLANT
GLOVE BIO SURGEON STRL SZ7 (GLOVE) ×3 IMPLANT
GLOVE BIO SURGEON STRL SZ7.5 (GLOVE) ×3 IMPLANT
GLOVE BIOGEL PI IND STRL 7.0 (GLOVE) ×1 IMPLANT
GLOVE BIOGEL PI IND STRL 8 (GLOVE) ×1 IMPLANT
GLOVE BIOGEL PI INDICATOR 7.0 (GLOVE) ×2
GLOVE BIOGEL PI INDICATOR 8 (GLOVE) ×2
GOWN STRL REUS W/ TWL LRG LVL3 (GOWN DISPOSABLE) ×1 IMPLANT
GOWN STRL REUS W/ TWL XL LVL3 (GOWN DISPOSABLE) ×1 IMPLANT
GOWN STRL REUS W/TWL LRG LVL3 (GOWN DISPOSABLE) ×2
GOWN STRL REUS W/TWL XL LVL3 (GOWN DISPOSABLE) ×2
HANDPIECE INTERPULSE COAX TIP (DISPOSABLE) ×2
HEMOSTAT SURGICEL 2X14 (HEMOSTASIS) ×3 IMPLANT
HOOD PEEL AWAY FLYTE STAYCOOL (MISCELLANEOUS) ×6 IMPLANT
KIT BASIN OR (CUSTOM PROCEDURE TRAY) ×3 IMPLANT
KIT ROOM TURNOVER OR (KITS) ×3 IMPLANT
MANIFOLD NEPTUNE II (INSTRUMENTS) ×3 IMPLANT
NEEDLE HYPO 25GX1X1/2 BEV (NEEDLE) IMPLANT
NEEDLE MAYO TROCAR (NEEDLE) ×3 IMPLANT
NS IRRIG 1000ML POUR BTL (IV SOLUTION) ×3 IMPLANT
PACK SHOULDER (CUSTOM PROCEDURE TRAY) ×3 IMPLANT
PAD ARMBOARD 7.5X6 YLW CONV (MISCELLANEOUS) ×6 IMPLANT
RESTRAINT HEAD UNIVERSAL NS (MISCELLANEOUS) ×3 IMPLANT
RETRIEVER SUT HEWSON (MISCELLANEOUS) ×3 IMPLANT
SET HNDPC FAN SPRY TIP SCT (DISPOSABLE) ×1 IMPLANT
SLING ARM IMMOBILIZER LRG (SOFTGOODS) ×3 IMPLANT
SLING ARM IMMOBILIZER MED (SOFTGOODS) IMPLANT
SMARTMIX MINI TOWER (MISCELLANEOUS) ×3
SPONGE LAP 18X18 X RAY DECT (DISPOSABLE) ×3 IMPLANT
SPONGE LAP 4X18 X RAY DECT (DISPOSABLE) IMPLANT
STEM HUMERAL STANDARD PTC 82MM (Stem) ×2 IMPLANT
STEM HUMERAL STD PTC 82MM (Stem) ×1 IMPLANT
STRIP CLOSURE SKIN 1/2X4 (GAUZE/BANDAGES/DRESSINGS) ×2 IMPLANT
SUCTION FRAZIER HANDLE 10FR (MISCELLANEOUS) ×2
SUCTION TUBE FRAZIER 10FR DISP (MISCELLANEOUS) ×1 IMPLANT
SUPPORT WRAP ARM LG (MISCELLANEOUS) ×3 IMPLANT
SUT ETHIBOND NAB CT1 #1 30IN (SUTURE) ×9 IMPLANT
SUT MNCRL AB 4-0 PS2 18 (SUTURE) ×3 IMPLANT
SUT SILK 2 0 TIES 17X18 (SUTURE)
SUT SILK 2-0 18XBRD TIE BLK (SUTURE) IMPLANT
SUT VIC AB 2-0 CT1 27 (SUTURE) ×4
SUT VIC AB 2-0 CT1 TAPERPNT 27 (SUTURE) ×2 IMPLANT
SYR CONTROL 10ML LL (SYRINGE) IMPLANT
TAPE LABRALWHITE 1.5X36 (TAPE) ×6 IMPLANT
TOWEL OR 17X24 6PK STRL BLUE (TOWEL DISPOSABLE) ×3 IMPLANT
TOWEL OR 17X26 10 PK STRL BLUE (TOWEL DISPOSABLE) ×3 IMPLANT
TOWER SMARTMIX MINI (MISCELLANEOUS) ×1 IMPLANT

## 2015-09-27 NOTE — Transfer of Care (Signed)
Immediate Anesthesia Transfer of Care Note  Patient: Jasmine Huff  Procedure(s) Performed: Procedure(s) with comments: TOTAL SHOULDER ARTHROPLASTY (Left) - Left shoulder arthroplasty  Patient Location: PACU  Anesthesia Type:General  Level of Consciousness: awake, alert , oriented and patient cooperative  Airway & Oxygen Therapy: Patient Spontanous Breathing and Patient connected to face mask oxygen  Post-op Assessment: Report given to RN, Post -op Vital signs reviewed and stable and Patient moving all extremities  Post vital signs: Reviewed and stable  Last Vitals:  Filed Vitals:   09/27/15 0559  BP: 189/68  Pulse: 97  Temp: 36.8 C  Resp: 18    Complications: No apparent anesthesia complications

## 2015-09-27 NOTE — Discharge Instructions (Signed)

## 2015-09-27 NOTE — Anesthesia Preprocedure Evaluation (Addendum)
Anesthesia Evaluation  Patient identified by MRN, date of birth, ID band Patient awake    Reviewed: Allergy & Precautions, NPO status , Patient's Chart, lab work & pertinent test results  Airway Mallampati: I  TM Distance: >3 FB Neck ROM: Full    Dental  (+) Teeth Intact, Dental Advisory Given   Pulmonary sleep apnea (Cannnot tolerate cpap mask) ,    breath sounds clear to auscultation       Cardiovascular hypertension, Pt. on medications  Rhythm:Regular Rate:Normal     Neuro/Psych    GI/Hepatic hiatal hernia, GERD  Medicated and Controlled,  Endo/Other  Diabetes: Pt denies having DM.Morbid obesity  Renal/GU      Musculoskeletal   Abdominal   Peds  Hematology   Anesthesia Other Findings   Reproductive/Obstetrics                            Anesthesia Physical Anesthesia Plan  ASA: IV  Anesthesia Plan: General   Post-op Pain Management: GA combined w/ Regional for post-op pain   Induction: Intravenous  Airway Management Planned: Oral ETT  Additional Equipment:   Intra-op Plan:   Post-operative Plan: Extubation in OR and Possible Post-op intubation/ventilation  Informed Consent: I have reviewed the patients History and Physical, chart, labs and discussed the procedure including the risks, benefits and alternatives for the proposed anesthesia with the patient or authorized representative who has indicated his/her understanding and acceptance.   Dental advisory given  Plan Discussed with: CRNA, Anesthesiologist and Surgeon  Anesthesia Plan Comments:         Anesthesia Quick Evaluation

## 2015-09-27 NOTE — H&P (Signed)
Jasmine Huff is an 60 y.o. female.   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  Pain interferes with sleep and quality of life.  Past Medical History  Diagnosis Date  . Allergy   . Chronic pain syndrome   . Knee pain   . Urination frequency   . Anemia   . Sleep apnea     CPAP  . Migraine   . Fibromyalgia   . H/O hiatal hernia   . Anxiety     takes Xanax daily as needed  . Panic attacks   . Hypertension     takes Losartan daily  . Depression     takes Latuda,Wellbutrin,Lexapro daily  . Insomnia     takes Ambien nightly  . Shortness of breath dyspnea     with exertion  . History of migraine     last one a wk ago;takes Excedrine as needed  . Weakness     left arm  . Restless leg     takes Requip nightly  . Arthritis     RA  . Joint pain   . Chronic back pain     bulding disc and arthritis.  Marland Kitchen GERD (gastroesophageal reflux disease) 01/21/2011    takes omeprazole daily  . Urinary urgency   . Urinary frequency   . Nocturia     Past Surgical History  Procedure Laterality Date  . Abdominal hysterectomy    . Right ovary removal    . Left hemicolectomy with splenic flexure mobilization and end colostomy  04/04/2012  . Laparotomy  04/04/2012    Procedure: EXPLORATORY LAPAROTOMY;  Surgeon: Leighton Ruff, MD;  Location: [facility] ORS;  Service: General;  Laterality: N/A;  left colectomy  . Colostomy  04/04/2012    Procedure: COLOSTOMY;  Surgeon: Leighton Ruff, MD;  Location: [facility] ORS;  Service: General;  Laterality: N/A;  . Colonoscopy N/A 11/12/2012    Procedure: COLONOSCOPY;  Surgeon: Leighton Ruff, MD;  Location: [facility]COPY;  Service: Endoscopy;  Laterality: N/A;  . Colon surgery  Oct 2013    perf bowel w/ colostomy  . Cholecystectomy  10 yrs ago  . Colostomy reversal N/A 02/17/2013    Procedure:  LAPAROSCOPIC COLOSTOMY REVERSAL; SMALL BOWEL  RESECTION; LYSIS OF ADHESIONS ;  Surgeon: Leighton Ruff, MD;  Location: [facility];  Service: General;  Laterality: N/A;  . Colonoscopy    . Refractive surgery      Family History  Problem Relation Age of Onset  . Depression Mother   . Depression Father   . Alcohol abuse Brother   . Depression Brother   . Depression Maternal Grandfather   . Depression Other    Social History:  reports that she has never smoked. She has never used smokeless tobacco. She reports that she does not drink alcohol or use illicit drugs.  Allergies:  Allergies  Allergen Reactions  . Asenapine Anaphylaxis, Shortness Of Breath and Other (See Comments)    Lips tingle and tongue becomes numb/Akasthesia  . Saphris [Asenapine Maleate] Anaphylaxis, Shortness Of Breath and Other (See Comments)    Lips tingle and tongue becomes numb/Akasthesia  . Seroquel [Quetiapine Fumarate] Shortness Of Breath and Other (See Comments)    "out of breath"  . Iodinated Diagnostic Agents Hives    Dye used for CT scans, severe hives  . Pregabalin Rash and Other (See Comments)    Alters Mental status, flushing  Caused by lyrica  . Buprenorphine Rash    Medications Prior to Admission  Medication Sig Dispense Refill  . acetaminophen (TYLENOL) 500 MG tablet Take 500 mg by mouth every 4 (four) hours as needed for moderate pain.    Marland Kitchen ALPRAZolam (XANAX) 1 MG tablet Take 0.5-1 mg by mouth 2 (two) times daily as needed for anxiety or sleep. 0.5mg  during the day, 1mg  at bedtime both as needed    . buPROPion (WELLBUTRIN XL) 150 MG 24 hr tablet Take 450 mg by mouth every morning.    . cetirizine (ZYRTEC) 10 MG tablet Take 10 mg by mouth every evening.     . cholecalciferol (VITAMIN D) 1000 units tablet Take 1,000 Units by mouth daily.    Marland Kitchen escitalopram (LEXAPRO) 20 MG tablet Take 40 mg by mouth every morning.     . hydroxychloroquine (PLAQUENIL) 200 MG tablet Take 100-200 mg by mouth 2 (two) times daily. 200mg  in the morning, 100mg  in the  evening    . hydrOXYzine (ATARAX/VISTARIL) 25 MG tablet Take 25 mg by mouth 3 (three) times daily as needed for nausea.    Marland Kitchen losartan (COZAAR) 100 MG tablet Take 100 mg by mouth daily.    . Lurasidone HCl (LATUDA) 60 MG TABS Take 60 mg by mouth 2 (two) times daily.    Marland Kitchen morphine (MS CONTIN) 15 MG 12 hr tablet Take 15 mg by mouth every 12 (twelve) hours.    . Multiple Vitamin (MULTIVITAMIN WITH MINERALS) TABS tablet Take 1 tablet by mouth daily.    . naproxen sodium (ANAPROX) 220 MG tablet Take 220 mg by mouth 2 (two) times daily with a meal.    . nystatin cream (MYCOSTATIN) Apply 1 application topically 2 (two) times daily.    Marland Kitchen omeprazole (PRILOSEC) 40 MG capsule Take 40 mg by mouth every evening.     . Oxycodone HCl 10 MG TABS Take 10 mg by mouth every 8 (eight) hours as needed (pain).    Marland Kitchen rOPINIRole (REQUIP) 3 MG tablet Take 3 mg by mouth at bedtime.    Marland Kitchen zolpidem (AMBIEN) 10 MG tablet Take 10 mg by mouth at bedtime as needed for sleep.      No results found for this or any previous visit (from the past 48 hour(s)). No results found.  Review of Systems  All other systems reviewed and are negative.   Blood pressure 189/68, pulse 97, temperature 98.2 F (36.8 C), temperature source Oral, resp. rate 18, weight 163.295 kg (360 lb), SpO2 94 %. Physical Exam  Constitutional: She is oriented to person, place, and time. She appears well-developed and well-nourished.  HENT:  Head: Atraumatic.  Cardiovascular: Intact distal pulses.   Respiratory: Effort normal.  Musculoskeletal:  L shoulder pain with limited ROM. NVID.  Neurological: She is alert and oriented to person, place, and time.  Skin: Skin is warm and dry.  Psychiatric: She has a normal mood and affect.     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  Pain interferes with sleep and quality of life. Plan L total shoulder  replacement Risks / benefits of surgery discussed Consent on chart  NPO for OR Preop antibiotics   Nita Sells, MD 09/27/2015, 7:11 AM

## 2015-09-27 NOTE — Anesthesia Procedure Notes (Addendum)
Anesthesia Regional Block:  Interscalene brachial plexus block  Pre-Anesthetic Checklist: ,, timeout performed, Correct Patient, Correct Site, Correct Laterality, Correct Procedure, Correct Position, site marked, Risks and benefits discussed,  Surgical consent,  Pre-op evaluation,  At surgeon's request and post-op pain management  Laterality: Left and Upper  Prep: chloraprep       Needles:  Injection technique: Single-shot  Needle Type: Echogenic Needle     Needle Length: 5cm 5 cm Needle Gauge: 21 and 21 G    Additional Needles:  Procedures: ultrasound guided (picture in chart) Interscalene brachial plexus block Narrative:  Start time: 09/27/2015 7:00 AM End time: 09/27/2015 7:05 AM Injection made incrementally with aspirations every 5 mL.  Performed by: Personally  Anesthesiologist: CREWS, DAVID   Procedure Name: Intubation Date/Time: 09/27/2015 7:41 AM Performed by: Myna Bright Pre-anesthesia Checklist: Patient identified, Emergency Drugs available, Suction available and Patient being monitored Patient Re-evaluated:Patient Re-evaluated prior to inductionOxygen Delivery Method: Circle system utilized Preoxygenation: Pre-oxygenation with 100% oxygen Intubation Type: IV induction Ventilation: Mask ventilation with difficulty Laryngoscope Size: Mac and 3 Grade View: Grade I Tube type: Oral Tube size: 7.5 mm Number of attempts: 1 Airway Equipment and Method: Stylet Placement Confirmation: ETT inserted through vocal cords under direct vision,  positive ETCO2 and breath sounds checked- equal and bilateral Secured at: 21 cm Tube secured with: Tape Dental Injury: Teeth and Oropharynx as per pre-operative assessment       Left IS block image

## 2015-09-27 NOTE — Op Note (Signed)
Procedure(s): TOTAL SHOULDER ARTHROPLASTY Procedure Note  Jasmine Huff female 60 y.o. 09/27/2015  Procedure(s) and Anesthesia Type:    * LEFT TOTAL SHOULDER ARTHROPLASTY - Choice  Surgeon(s) and Role:    * Tania Ade, MD - Primary   Indications:  60 y.o. female  With endstage left shoulder arthritis. Pain and dysfunction interfered with quality of life and nonoperative treatment with activity modification, NSAIDS and injections failed.  She is morbidly obese and was counseled that this likely increase her risk of complications from surgery.     Surgeon: Nita Sells   Assistants: Jeanmarie Hubert PA-C [facility] was present and scrubbed throughout the procedure and was essential in positioning, retraction, exposure, and closure)  Anesthesia: General endotracheal anesthesia With preoperative interscalene block given by the attending anesthesiologist     Procedure Detail  TOTAL SHOULDER ARTHROPLASTY  Findings: Tornier flex anatomic press-fit size 5 stem with a 46 low offset head, cemented size 40 medium Cortiloc glenoid.   A lesser tuberosity osteotomy was performed and repaired at the conclusion of the procedure.  Estimated Blood Loss:  200 mL         Drains: None   Blood Given: none          Specimens: none        Complications:  * No complications entered in OR log *         Disposition: PACU - hemodynamically stable.         Condition: stable    Procedure:   The patient was identified in the preoperative holding area where I personally marked the operative extremity after verifying with the patient and consent. She  was taken to the operating room where She was transferred to the   operative table.  The patient received an interscalene block in   the holding area by the attending anesthesiologist.  General anesthesia was induced   in the operating room without complication.  The patient did receive IV  Ancef prior to the commencement of the  procedure.  The patient was   placed in the beach-chair position with the back raised about 30   degrees.  The nonoperative extremity and head and neck were carefully   positioned and padded protecting against neurovascular compromise.  The   left upper extremity was then prepped and draped in the standard sterile   fashion.    The appropriate operative time-out was performed with   Anesthesia, the perioperative staff, as well as myself and we all agreed   that the left side was the correct operative site. The patient was given 1 g  IV tranexamic acid at the time the incision.  An approximately   15 cm incision was made from the tip of the coracoid to the center point of the   humerus at the level of the axilla.  Dissection was carried down sharply   through subcutaneous tissues and cephalic vein was identified and taken   laterally with the deltoid.  The pectoralis major was taken medially.  The   upper 1 cm of the pectoralis major was released from its attachment on   the humerus.  The clavipectoral fascia was incised just lateral to the   conjoined tendon.  This incision was carried up to but not into the   coracoacromial ligament.  Digital palpation was used to prove   integrity of the axillary nerve which was protected throughout the   procedure.  Musculocutaneous nerve was not palpated in the operative  field.  Conjoined tendon was then retracted gently medially and the   deltoid laterally.  Anterior circumflex humeral vessels were clamped and   coagulated.  The soft tissues overlying the biceps was incised and this   incision was carried across the transverse humeral ligament to the base   of the coracoid.  The biceps was tenodesed to the soft tissue just above   pectoralis major and the remaining portion of the biceps superiorly was   excised.  An osteotomy was performed at the lesser tuberosity.  Capsule was then   released all the way down to the 6 o'clock position of the humeral  head.   The humeral head was then delivered with simultaneous adduction,   extension and external rotation.  All humeral osteophytes were removed   and the anatomic neck of the humerus was marked and cut free hand at   approximately 25 degrees retroversion within about 3 mm of the cuff   reflection posteriorly.  The head size was estimated to be a 46 medium   offset.  At that point, the humeral head was retracted posteriorly with   a Fukuda retractor.   Remaining portion of the capsule was released at the base of the   coracoid.  The remaining biceps anchor and the entire anterior-inferior   labrum was excised.  The posterior labrum was also excised but the   posterior capsule was not released.  The guidepin was placed bicortically with +5 elevated guide.  The reamer was used to ream to concentric bone with punctate bleeding.  This gave an excellent concentric surface.  The center hole was then drilled for an anchor peg glenoid followed by the three peripheral holes and none of the holes   exited the glenoid wall.  I then pulse irrigated these holes and dried   them with Surgicel.  The three peripheral holes were then   pressurized cemented and the anchor peg glenoid was placed and impacted   with an excellent fit.  The glenoid was a 40 medium component.  The proximal humerus was then again exposed taking care not to displace the glenoid.    The entry awl was used followed by sounding reamers and then sequentially broached from size 3 to 5. This was then left in place and the calcar planer was used. Trial head was placed with a 46 low offset.  With the trial implantation of the component,  there was approximately 50% posterior translation with immediate snap back to the anatomic position.  With forward elevation, there was no tendency   towards posterior subluxation.   The trial was removed and the final implant was prepared on a back table.  The trial was removed and the final implant was prepared  on a back table.   3 small holes were drilled on the medial side of the lesser tuberosity osteotomy, through which 2 labral tapes were passed. The implant was then placed through the loop of the 2 labral tapes and impacted with an excellent press-fit. This achieved excellent anatomic reconstruction of the proximal humerus.  The joint was then copiously irrigated with pulse lavage.  The subscapularis and   lesser tuberosity osteotomy were then repaired using the 2 labral tapes previously passed in a double row fashion with horizontal mattress sutures medially brought over through bone tunnels tied over a bone bridge laterally.   One #1 Ethibond was placed at the rotator interval just above   the lesser tuberosity. Copious irrigation was used.  Skin was closed with 2-0 Vicryl sutures in the deep dermal layer and 4-0 Monocryl in a subcuticular  running fashion.  Sterile dressings were then applied including Aquacel.  The patient was placed in a sling and allowed to awaken from general anesthesia and taken to the recovery room in stable condition.      POSTOPERATIVE PLAN:  Early passive range of motion will be allowed with the goal of 0 degrees external rotation and 90 degrees forward elevation.  No internal rotation at this time.  No active motion of the arm until the lesser tuberosity heals.  The patient will likely be kept in the hospital for 1-2 days and then discharged home.

## 2015-09-28 ENCOUNTER — Encounter (HOSPITAL_COMMUNITY): Payer: Self-pay | Admitting: Orthopedic Surgery

## 2015-09-28 LAB — BASIC METABOLIC PANEL
Anion gap: 8 (ref 5–15)
BUN: 14 mg/dL (ref 6–20)
CHLORIDE: 104 mmol/L (ref 101–111)
CO2: 28 mmol/L (ref 22–32)
CREATININE: 1.13 mg/dL — AB (ref 0.44–1.00)
Calcium: 8.2 mg/dL — ABNORMAL LOW (ref 8.9–10.3)
GFR calc Af Amer: >60 mL/min (ref >60)
GFR calc non Af Amer: 52 mL/min — ABNORMAL LOW (ref >60)
GLUCOSE: 158 mg/dL — AB (ref 65–99)
POTASSIUM: 4.4 mmol/L (ref 3.5–5.1)
Sodium: 140 mmol/L (ref 135–145)

## 2015-09-28 LAB — CBC
HEMATOCRIT: 35.1 % — AB (ref 36.0–46.0)
HEMOGLOBIN: 10.3 g/dL — AB (ref 12.0–15.0)
MCH: 26.1 pg (ref 26.0–34.0)
MCHC: 29.3 g/dL — AB (ref 30.0–36.0)
MCV: 88.9 fL (ref 78.0–100.0)
Platelets: 372 10*3/uL (ref 150–400)
RBC: 3.95 MIL/uL (ref 3.87–5.11)
RDW: 15.3 % (ref 11.5–15.5)
WBC: 11 10*3/uL — ABNORMAL HIGH (ref 4.0–10.5)

## 2015-09-28 MED ORDER — OXYCODONE HCL 5 MG PO TABS
10.0000 mg | ORAL_TABLET | ORAL | Status: DC | PRN
  Administered 2015-09-28 (×2): 10 mg via ORAL
  Filled 2015-09-28 (×2): qty 2

## 2015-09-28 MED ORDER — OXYCODONE HCL 10 MG PO TABS: ORAL_TABLET | ORAL | Status: DC

## 2015-09-28 NOTE — Progress Notes (Signed)
Pt ready for discharge. Education/instructions reviewed with pt and all questions/concerns addressed. IV removed and belongings gathered. Pt will be transported out via wheelchair to brother's car. Will continue to monitor

## 2015-09-28 NOTE — Care Management Note (Signed)
Case Management Note  Patient Details  Name: Jasmine Huff MRN: [id number] Date of Birth: [birth year]  Subjective/Objective:     60 yr old female s/p right total shoulder arthroplasty.               Action/Plan: Case manager spoke with Jasmine Huff, CM who stated she will arrange for Home Health PT/OT with [place] for patient.   Expected Discharge Date:   09/28/15               Expected Discharge Plan:   Home with Home Health  In-House Referral:     Discharge planning Services     Post Acute Care Choice:    Choice offered to:     DME Arranged:   NA DME Agency:     HH Arranged:   OT/PT HH Agency:   [facility]  Status of Service:   Completed.  Medicare Important Message Given:    Date Medicare IM Given:    Medicare IM give by:    Date Additional Medicare IM Given:    Additional Medicare Important Message give by:     If discussed at [place] of Stay Meetings, dates discussed:    Additional Comments:  Ninfa Meeker, RN 09/28/2015, 11:16 AM

## 2015-09-28 NOTE — Progress Notes (Signed)
OT Evaluation  Pt will need HHOT/PT and home health Aide for safe D/C. Pt very tearful at times throughout session regarding concerns for her ability to care for herself after D/C. Will need to return to complete education with pt/brother.   09/28/15 0900  OT Visit Information  Last OT Received On 09/28/15  Assistance Needed +1  History of Present Illness s/p L TSA  Precautions  Precautions Shoulder  Type of Shoulder Precautions passive; FF to 90; ER to neutral; no abduction; elbow flex/ext AROM  Shoulder Interventions Shoulder sling/immobilizer;At all times;Off for dressing/bathing/exercises  Precaution Booklet Issued Yes (comment)  Restrictions  LUE Weight Bearing NWB  Home Living  Family/patient expects to be discharged to: Private residence  Living Arrangements Other relatives  Available Help at Discharge Family;Available 24 hours/day  Type of Home House  Home Access Stairs to enter  Entrance Stairs-Number of Steps 1  Entrance Stairs-Rails None  Home Layout One level  Bathroom Shower/Tub Walk-in shower  Bathroom Toilet Handicapped height  Bathroom Accessibility Yes  How Accessible Accessible via walker  [place] - 2 wheels;Shower seat;Hand held shower head;Grab bars - tub/shower  Prior Function  Level of Independence Independent with assistive device(s)  Comments used walker  Communication  Communication No difficulties  Pain Assessment  Pain Assessment 0-10  Pain Score 9  Pain Location L shoulder  Pain Descriptors / Indicators Aching;Burning  Pain Intervention(s) Limited activity within patient's tolerance;Ice applied;Repositioned  Cognition  Arousal/Alertness Awake/alert  Behavior During Therapy Anxious  Overall Cognitive Status Within Functional Limits for tasks assessed  Upper Extremity Assessment  Upper Extremity Assessment LUE deficits/detail  LUE Coordination decreased gross motor  Lower Extremity Assessment  Lower Extremity Assessment  Generalized weakness (c/o arthritis in B knees)  Cervical / Trunk Assessment  Cervical / Trunk Assessment Normal  ADL  Overall ADL's  Needs assistance/impaired  Eating/Feeding Set up  Grooming Minimal assistance;Sitting  Upper Body Bathing Maximal assistance;Sitting  Lower Body Bathing Maximal assistance;Sit to/from stand  Upper Body Dressing  Maximal assistance;Sitting  Lower Body Dressing Moderate assistance;Sit to/from Environmental education officer guard;RW;Ambulation  Toileting- Water quality scientist and Hygiene Minimal assistance;Sit to/from stand  Functional mobility during ADLs Min guard;Rolling walker  General ADL Comments Began education regarding compensatory techniques for ADL. Pt has a Secondary school teacher at home. does not wear socks. began crying and stated she did not know how shw was going to manage. Has a brother who lives with her but she does not want him to assist with personal care.. Pt uses a RW for ambulation. Using L hand on RW to guide RW without any weight through LUE. Pt states " I have to use my RW"  Bed Mobility  Overal bed mobility Needs Assistance  Bed Mobility Supine to Sit;Sit to Supine  Supine to sit Supervision  Sit to supine Min assist (to lift RLE onto bed)  Transfers  Overall transfer level Needs assistance  Equipment used Rolling walker (2 wheeled)  Transfers Sit to/from Stand  Sit to Stand Supervision  Exercises  Exercises Shoulder  Shoulder Instructions  Donning/doffing shirt without moving shoulder Maximal assistance  Method for sponge bathing under operated UE Maximal assistance  Donning/doffing sling/immobilizer Maximal assistance  Correct positioning of sling/immobilizer Maximal assistance  ROM for elbow, wrist and digits of operated UE Moderate assistance  Sling wearing schedule (on at all times/off for ADL's) Minimal assistance  Proper positioning of operated UE when showering Minimal assistance  Positioning of UE while sleeping Minimal assistance  Shoulder Exercises  Shoulder Flexion PROM;Left;10 reps;Supine (0-90 allowed; achieved 0-20)  Shoulder External Rotation PROM;Left;10 reps;Supine (0-30 total achieve. allowed to neutral)  Elbow Flexion AAROM;Left;10 reps;Supine  Elbow Extension AAROM;Left;10 reps;Supine  Wrist Flexion AROM;Left;10 reps  Wrist Extension AROM;Left;10 reps  Digit Composite Flexion AROM;Left;10 reps  Composite Extension AROM;Left;10 reps  OT - End of Session  Activity Tolerance Patient tolerated treatment well  Patient left in bed;with call bell/phone within reach  Nurse Communication Mobility status;Precautions;Weight bearing status;Other (comment) (need for HHOT/PT and HH aide)  OT Assessment  OT Therapy Diagnosis  Generalized weakness;Acute pain  OT Recommendation/Assessment Patient needs continued OT Services  OT Problem List Decreased strength;Decreased range of motion;Decreased activity tolerance;Impaired balance (sitting and/or standing);Decreased coordination;Decreased safety awareness;Decreased knowledge of use of DME or AE;Decreased knowledge of precautions;Obesity;Impaired UE functional use;Pain;Increased edema  Barriers to Discharge Decreased caregiver support  OT Plan  OT Frequency (ACUTE ONLY) Min 3X/week  OT Treatment/Interventions (ACUTE ONLY) Self-care/ADL training;Therapeutic exercise;DME and/or AE instruction;Therapeutic activities;Patient/family education;Balance training  OT Recommendation  Follow Up Recommendations Home health OT;Supervision/Assistance - 24 hour;Other (comment) (home health Aide)  OT Equipment None recommended by OT  Individuals Consulted  Consulted and Agree with Results and Recommendations Patient  Acute Rehab OT Goals  Patient Stated Goal to go home today  OT Goal Formulation With patient  Time For Goal Achievement 10/05/15  Potential to Achieve Goals Good  OT Time Calculation  OT Start Time (ACUTE ONLY) 0910  OT Stop Time (ACUTE ONLY) 1007  OT Time  Calculation (min) 57 min  OT General Charges  $OT Visit 1 Procedure  OT Evaluation  $OT Eval Moderate Complexity 1 Procedure  OT Treatments  $Self Care/Home Management  23-37 mins  $Therapeutic Exercise 8-22 mins  Written Expression  Dominant Hand Right  [facility], OTR/L  [phone] 09/28/2015

## 2015-09-28 NOTE — Anesthesia Postprocedure Evaluation (Signed)
Anesthesia Post Note  Patient: Jasmine Huff  Procedure(s) Performed: Procedure(s) (LRB): TOTAL SHOULDER ARTHROPLASTY (Left)  Patient location during evaluation: PACU Anesthesia Type: General and Regional Level of consciousness: awake Pain management: pain level controlled Vital Signs Assessment: post-procedure vital signs reviewed and stable Respiratory status: spontaneous breathing and respiratory function stable Cardiovascular status: stable Postop Assessment: no signs of nausea or vomiting Anesthetic complications: no    Last Vitals:  Filed Vitals:   09/28/15 0100 09/28/15 0500  BP: 110/50 137/66  Pulse: 89 99  Temp: 36.3 C 37.4 C  Resp: 14 16    Last Pain:  Filed Vitals:   09/28/15 0848  PainSc: 9                  [name]

## 2015-09-28 NOTE — Progress Notes (Signed)
   PATIENT ID: Forrestine Him   1 Day Post-Op Procedure(s) (LRB): TOTAL SHOULDER ARTHROPLASTY (Left)  Subjective: Had left shoulder pain overnight not well controlled by pain rx. No other complaints or concerns.  Objective:  Filed Vitals:   09/28/15 0100 09/28/15 0500  BP: 110/50 137/66  Pulse: 89 99  Temp: 97.4 F (36.3 C) 99.4 F (37.4 C)  Resp: 14 16     L shoulder dressing c/d/i Wiggles fingers, distally NVI Minimal swelling  Labs:   Recent Labs  09/28/15 0400  HGB 10.3*   Recent Labs  09/28/15 0400  WBC 11.0*  RBC 3.95  HCT 35.1*  PLT 372   Recent Labs  09/28/15 0400  NA 140  K 4.4  CL 104  CO2 28  BUN 14  CREATININE 1.13*  GLUCOSE 158*  CALCIUM 8.2*    Assessment and Plan: 1 day s/p left TSA Increased oxycodone 10mg  to q 4 hrs instead of q 8 hrs and this should help. She is on chronic pain medication and total pain control may be difficult Goal to d/c home today or tomorrow once pain under control and cleared by OT OT PROM limited to 90 FF and 0 ER Will check with nurse this afternoon and see how she is doing   VTE proph: ASA 325mg  BID, SCDs

## 2015-09-28 NOTE — Progress Notes (Signed)
Occupational Therapy Treatment Patient Details Name: Jasmine Huff MRN: [id number] DOB: 05-29-1956 Today's Date: 09/28/2015    History of present illness s/p L TSA   OT comments  Seen this pm prior to D/C to complete pt/family education and assist with ADL retraining using compensatory techniques and strategies to increase independence and compliance with protocol. Pt will follow up with HHOT to focus on self care and assist with exercises as pt can not reach her LUE with her RUE sufficiently to complete her PROM due to her body habitus. Case manager aware. Pt very appreciative of help.   Follow Up Recommendations  Home health OT;Supervision/Assistance - 24 hour;Other (comment)    Equipment Recommendations  None recommended by OT    Recommendations for Other Services      Precautions / Restrictions Precautions Precautions: Shoulder Type of Shoulder Precautions: passive; FF to 90; ER to neutral; no abduction; elbow flex/ext AROM Shoulder Interventions: Shoulder sling/immobilizer;At all times;Off for dressing/bathing/exercises Precaution Booklet Issued: Yes (comment) Restrictions LUE Weight Bearing: Non weight bearing       Mobility Bed Mobility                  Transfers       Sit to Stand: Supervision              Balance                                   ADL                                         General ADL Comments: Completed ADL session with pt. Educated on use of AE to assist with bathing and dressing. also discussed clothing selection and recommended pt only use large pullover gowns and not use panties/pants at this time due to inability to donn without using L hand. stressed importance of not trying to pull or push anything with L hand, including trying to lift breasts or other areas for bathing. Pt became tearful. Pt comforted and educated to do what she could and let the [facility] therapists and aid assist with self care. Pt  able to instruct this therapist on correct technique for ADL. also verbalized understanding of positioning in bed and importance of building up bed to increase ability to get in/out of bed.      Vision                     Perception     Praxis      Cognition   Behavior During Therapy: Anxious Overall Cognitive Status: Within Functional Limits for tasks assessed                       Extremity/Trunk Assessment               Exercises Shoulder Exercises Shoulder Flexion: PROM;Left;10 reps;Supine Shoulder External Rotation: PROM;Left;10 reps;Supine Elbow Flexion: AAROM;Left;10 reps;Supine Elbow Extension: AAROM;Left;10 reps;Supine Wrist Flexion: AROM;Left;10 reps Wrist Extension: AROM;Left;10 reps Digit Composite Flexion: AROM;Left;10 reps Composite Extension: AROM;Left;10 reps Donning/doffing shirt without moving shoulder: Maximal assistance Method for sponge bathing under operated UE: Maximal assistance Donning/doffing sling/immobilizer: Moderate assistance Correct positioning of sling/immobilizer: Minimal assistance ROM for elbow, wrist and digits of operated UE: Moderate assistance Sling wearing schedule (on at all  times/off for ADL's): Supervision/safety Proper positioning of operated UE when showering: Supervision/safety Positioning of UE while sleeping: Supervision/safety   Shoulder Instructions Shoulder Instructions Donning/doffing shirt without moving shoulder: Maximal assistance Method for sponge bathing under operated UE: Maximal assistance Donning/doffing sling/immobilizer: Moderate assistance Correct positioning of sling/immobilizer: Minimal assistance ROM for elbow, wrist and digits of operated UE: Moderate assistance Sling wearing schedule (on at all times/off for ADL's): Supervision/safety Proper positioning of operated UE when showering: Supervision/safety Positioning of UE while sleeping: Supervision/safety     General Comments       Pertinent Vitals/ Pain       Pain Assessment: 0-10 Pain Score: 7  Pain Location: L shoulder Pain Descriptors / Indicators: Aching;Burning Pain Intervention(s): Limited activity within patient's tolerance;Repositioned  Home Living                                          Prior Functioning/Environment              Frequency       Progress Toward Goals  OT Goals(current goals can now be found in the care plan section)  Progress towards OT goals: Goals met/education completed, patient discharged from OT  Acute Rehab OT Goals Patient Stated Goal: to go home today OT Goal Formulation: With patient Time For Goal Achievement: 10/05/15 Potential to Achieve Goals: Good ADL Goals Pt Will Perform Upper Body Bathing: with mod assist;sitting Pt Will Perform Upper Body Dressing: with mod assist;sitting Pt/caregiver will Perform Home Exercise Program: Increased ROM;Left upper extremity;With Supervision;With written HEP provided Additional ADL Goal #1: Pt/caregier will verbalize understanding of correct positionoing of LUE in sitting and supine and sling use Additional ADL Goal #2: Pt will demonstrate understnading of technique to use for bed mobility following LUE shoulder protocol  Plan All goals met and education completed, patient discharged from OT services (acute . will continue with HH)    Co-evaluation                 End of Session     Activity Tolerance Patient tolerated treatment well   Patient Left in bed;with call bell/phone within reach;with family/visitor present   Nurse Communication Mobility status;Weight bearing status;Precautions;Other (comment) (reaady for D/C)        Time: 1202-1300 OT Time Calculation (min): 58 min  Charges: OT General Charges $OT Visit: 1 Procedure OT Treatments $Self Care/Home Management : 23-37 mins $Therapeutic Activity: 8-22 mins $Therapeutic Exercise: 8-22 mins  Mida Cory,HILLARY 09/28/2015, 2:47  PM   [facility], OTR/L  [phone] 09/28/2015

## 2015-10-01 DIAGNOSIS — Z96612 Presence of left artificial shoulder joint: Secondary | ICD-10-CM | POA: Diagnosis not present

## 2015-10-01 DIAGNOSIS — G4733 Obstructive sleep apnea (adult) (pediatric): Secondary | ICD-10-CM | POA: Diagnosis not present

## 2015-10-01 DIAGNOSIS — E119 Type 2 diabetes mellitus without complications: Secondary | ICD-10-CM | POA: Diagnosis not present

## 2015-10-01 DIAGNOSIS — Z471 Aftercare following joint replacement surgery: Secondary | ICD-10-CM | POA: Diagnosis not present

## 2015-10-01 DIAGNOSIS — Z6841 Body Mass Index (BMI) 40.0 and over, adult: Secondary | ICD-10-CM | POA: Diagnosis not present

## 2015-10-01 DIAGNOSIS — I10 Essential (primary) hypertension: Secondary | ICD-10-CM | POA: Diagnosis not present

## 2015-10-01 DIAGNOSIS — M797 Fibromyalgia: Secondary | ICD-10-CM | POA: Diagnosis not present

## 2015-10-01 DIAGNOSIS — K219 Gastro-esophageal reflux disease without esophagitis: Secondary | ICD-10-CM | POA: Diagnosis not present

## 2015-10-01 DIAGNOSIS — E785 Hyperlipidemia, unspecified: Secondary | ICD-10-CM | POA: Diagnosis not present

## 2015-10-02 DIAGNOSIS — I10 Essential (primary) hypertension: Secondary | ICD-10-CM | POA: Diagnosis not present

## 2015-10-02 DIAGNOSIS — Z471 Aftercare following joint replacement surgery: Secondary | ICD-10-CM | POA: Diagnosis not present

## 2015-10-02 DIAGNOSIS — G4733 Obstructive sleep apnea (adult) (pediatric): Secondary | ICD-10-CM | POA: Diagnosis not present

## 2015-10-02 DIAGNOSIS — E119 Type 2 diabetes mellitus without complications: Secondary | ICD-10-CM | POA: Diagnosis not present

## 2015-10-02 DIAGNOSIS — Z96612 Presence of left artificial shoulder joint: Secondary | ICD-10-CM | POA: Diagnosis not present

## 2015-10-02 DIAGNOSIS — M797 Fibromyalgia: Secondary | ICD-10-CM | POA: Diagnosis not present

## 2015-10-03 DIAGNOSIS — G4733 Obstructive sleep apnea (adult) (pediatric): Secondary | ICD-10-CM | POA: Diagnosis not present

## 2015-10-03 DIAGNOSIS — E119 Type 2 diabetes mellitus without complications: Secondary | ICD-10-CM | POA: Diagnosis not present

## 2015-10-03 DIAGNOSIS — M797 Fibromyalgia: Secondary | ICD-10-CM | POA: Diagnosis not present

## 2015-10-03 DIAGNOSIS — I10 Essential (primary) hypertension: Secondary | ICD-10-CM | POA: Diagnosis not present

## 2015-10-03 DIAGNOSIS — Z471 Aftercare following joint replacement surgery: Secondary | ICD-10-CM | POA: Diagnosis not present

## 2015-10-03 DIAGNOSIS — Z96612 Presence of left artificial shoulder joint: Secondary | ICD-10-CM | POA: Diagnosis not present

## 2015-10-04 DIAGNOSIS — Z96612 Presence of left artificial shoulder joint: Secondary | ICD-10-CM | POA: Diagnosis not present

## 2015-10-04 DIAGNOSIS — M797 Fibromyalgia: Secondary | ICD-10-CM | POA: Diagnosis not present

## 2015-10-04 DIAGNOSIS — I10 Essential (primary) hypertension: Secondary | ICD-10-CM | POA: Diagnosis not present

## 2015-10-04 DIAGNOSIS — Z471 Aftercare following joint replacement surgery: Secondary | ICD-10-CM | POA: Diagnosis not present

## 2015-10-04 DIAGNOSIS — E119 Type 2 diabetes mellitus without complications: Secondary | ICD-10-CM | POA: Diagnosis not present

## 2015-10-04 DIAGNOSIS — G4733 Obstructive sleep apnea (adult) (pediatric): Secondary | ICD-10-CM | POA: Diagnosis not present

## 2015-10-05 DIAGNOSIS — Z471 Aftercare following joint replacement surgery: Secondary | ICD-10-CM | POA: Diagnosis not present

## 2015-10-05 DIAGNOSIS — E119 Type 2 diabetes mellitus without complications: Secondary | ICD-10-CM | POA: Diagnosis not present

## 2015-10-05 DIAGNOSIS — M797 Fibromyalgia: Secondary | ICD-10-CM | POA: Diagnosis not present

## 2015-10-05 DIAGNOSIS — I10 Essential (primary) hypertension: Secondary | ICD-10-CM | POA: Diagnosis not present

## 2015-10-05 DIAGNOSIS — Z96612 Presence of left artificial shoulder joint: Secondary | ICD-10-CM | POA: Diagnosis not present

## 2015-10-05 DIAGNOSIS — G4733 Obstructive sleep apnea (adult) (pediatric): Secondary | ICD-10-CM | POA: Diagnosis not present

## 2015-10-08 DIAGNOSIS — M797 Fibromyalgia: Secondary | ICD-10-CM | POA: Diagnosis not present

## 2015-10-08 DIAGNOSIS — I10 Essential (primary) hypertension: Secondary | ICD-10-CM | POA: Diagnosis not present

## 2015-10-08 DIAGNOSIS — Z471 Aftercare following joint replacement surgery: Secondary | ICD-10-CM | POA: Diagnosis not present

## 2015-10-08 DIAGNOSIS — Z96612 Presence of left artificial shoulder joint: Secondary | ICD-10-CM | POA: Diagnosis not present

## 2015-10-08 DIAGNOSIS — G4733 Obstructive sleep apnea (adult) (pediatric): Secondary | ICD-10-CM | POA: Diagnosis not present

## 2015-10-08 DIAGNOSIS — E119 Type 2 diabetes mellitus without complications: Secondary | ICD-10-CM | POA: Diagnosis not present

## 2015-10-09 DIAGNOSIS — G4733 Obstructive sleep apnea (adult) (pediatric): Secondary | ICD-10-CM | POA: Diagnosis not present

## 2015-10-09 DIAGNOSIS — Z471 Aftercare following joint replacement surgery: Secondary | ICD-10-CM | POA: Diagnosis not present

## 2015-10-09 DIAGNOSIS — M797 Fibromyalgia: Secondary | ICD-10-CM | POA: Diagnosis not present

## 2015-10-09 DIAGNOSIS — Z96612 Presence of left artificial shoulder joint: Secondary | ICD-10-CM | POA: Diagnosis not present

## 2015-10-09 DIAGNOSIS — I10 Essential (primary) hypertension: Secondary | ICD-10-CM | POA: Diagnosis not present

## 2015-10-09 DIAGNOSIS — E119 Type 2 diabetes mellitus without complications: Secondary | ICD-10-CM | POA: Diagnosis not present

## 2015-10-10 DIAGNOSIS — I10 Essential (primary) hypertension: Secondary | ICD-10-CM | POA: Diagnosis not present

## 2015-10-10 DIAGNOSIS — E119 Type 2 diabetes mellitus without complications: Secondary | ICD-10-CM | POA: Diagnosis not present

## 2015-10-10 DIAGNOSIS — Z471 Aftercare following joint replacement surgery: Secondary | ICD-10-CM | POA: Diagnosis not present

## 2015-10-10 DIAGNOSIS — Z96612 Presence of left artificial shoulder joint: Secondary | ICD-10-CM | POA: Diagnosis not present

## 2015-10-10 DIAGNOSIS — M797 Fibromyalgia: Secondary | ICD-10-CM | POA: Diagnosis not present

## 2015-10-10 DIAGNOSIS — G4733 Obstructive sleep apnea (adult) (pediatric): Secondary | ICD-10-CM | POA: Diagnosis not present

## 2015-10-11 DIAGNOSIS — M797 Fibromyalgia: Secondary | ICD-10-CM | POA: Diagnosis not present

## 2015-10-11 DIAGNOSIS — Z96612 Presence of left artificial shoulder joint: Secondary | ICD-10-CM | POA: Diagnosis not present

## 2015-10-11 DIAGNOSIS — I10 Essential (primary) hypertension: Secondary | ICD-10-CM | POA: Diagnosis not present

## 2015-10-11 DIAGNOSIS — G4733 Obstructive sleep apnea (adult) (pediatric): Secondary | ICD-10-CM | POA: Diagnosis not present

## 2015-10-11 DIAGNOSIS — Z471 Aftercare following joint replacement surgery: Secondary | ICD-10-CM | POA: Diagnosis not present

## 2015-10-11 DIAGNOSIS — E119 Type 2 diabetes mellitus without complications: Secondary | ICD-10-CM | POA: Diagnosis not present

## 2015-10-12 DIAGNOSIS — I10 Essential (primary) hypertension: Secondary | ICD-10-CM | POA: Diagnosis not present

## 2015-10-12 DIAGNOSIS — Z96612 Presence of left artificial shoulder joint: Secondary | ICD-10-CM | POA: Diagnosis not present

## 2015-10-12 DIAGNOSIS — G4733 Obstructive sleep apnea (adult) (pediatric): Secondary | ICD-10-CM | POA: Diagnosis not present

## 2015-10-12 DIAGNOSIS — M797 Fibromyalgia: Secondary | ICD-10-CM | POA: Diagnosis not present

## 2015-10-12 DIAGNOSIS — E119 Type 2 diabetes mellitus without complications: Secondary | ICD-10-CM | POA: Diagnosis not present

## 2015-10-12 DIAGNOSIS — Z471 Aftercare following joint replacement surgery: Secondary | ICD-10-CM | POA: Diagnosis not present

## 2015-10-16 DIAGNOSIS — M797 Fibromyalgia: Secondary | ICD-10-CM | POA: Diagnosis not present

## 2015-10-16 DIAGNOSIS — I10 Essential (primary) hypertension: Secondary | ICD-10-CM | POA: Diagnosis not present

## 2015-10-16 DIAGNOSIS — J029 Acute pharyngitis, unspecified: Secondary | ICD-10-CM | POA: Diagnosis not present

## 2015-10-16 DIAGNOSIS — Z96612 Presence of left artificial shoulder joint: Secondary | ICD-10-CM | POA: Diagnosis not present

## 2015-10-16 DIAGNOSIS — Z471 Aftercare following joint replacement surgery: Secondary | ICD-10-CM | POA: Diagnosis not present

## 2015-10-16 DIAGNOSIS — G4733 Obstructive sleep apnea (adult) (pediatric): Secondary | ICD-10-CM | POA: Diagnosis not present

## 2015-10-16 DIAGNOSIS — L98499 Non-pressure chronic ulcer of skin of other sites with unspecified severity: Secondary | ICD-10-CM | POA: Diagnosis not present

## 2015-10-16 DIAGNOSIS — E119 Type 2 diabetes mellitus without complications: Secondary | ICD-10-CM | POA: Diagnosis not present

## 2015-10-17 DIAGNOSIS — M797 Fibromyalgia: Secondary | ICD-10-CM | POA: Diagnosis not present

## 2015-10-17 DIAGNOSIS — G4733 Obstructive sleep apnea (adult) (pediatric): Secondary | ICD-10-CM | POA: Diagnosis not present

## 2015-10-17 DIAGNOSIS — E119 Type 2 diabetes mellitus without complications: Secondary | ICD-10-CM | POA: Diagnosis not present

## 2015-10-17 DIAGNOSIS — Z471 Aftercare following joint replacement surgery: Secondary | ICD-10-CM | POA: Diagnosis not present

## 2015-10-17 DIAGNOSIS — Z96612 Presence of left artificial shoulder joint: Secondary | ICD-10-CM | POA: Diagnosis not present

## 2015-10-17 DIAGNOSIS — I10 Essential (primary) hypertension: Secondary | ICD-10-CM | POA: Diagnosis not present

## 2015-10-18 DIAGNOSIS — M797 Fibromyalgia: Secondary | ICD-10-CM | POA: Diagnosis not present

## 2015-10-18 DIAGNOSIS — E119 Type 2 diabetes mellitus without complications: Secondary | ICD-10-CM | POA: Diagnosis not present

## 2015-10-18 DIAGNOSIS — I10 Essential (primary) hypertension: Secondary | ICD-10-CM | POA: Diagnosis not present

## 2015-10-18 DIAGNOSIS — Z96612 Presence of left artificial shoulder joint: Secondary | ICD-10-CM | POA: Diagnosis not present

## 2015-10-18 DIAGNOSIS — G4733 Obstructive sleep apnea (adult) (pediatric): Secondary | ICD-10-CM | POA: Diagnosis not present

## 2015-10-18 DIAGNOSIS — Z471 Aftercare following joint replacement surgery: Secondary | ICD-10-CM | POA: Diagnosis not present

## 2015-10-22 NOTE — Discharge Summary (Signed)
Patient ID: Jasmine Huff MRN: [id number] DOB/AGE: [birth year] 60 y.o.  Admit date: 09/27/2015 Discharge date: 09/28/2015  Admission Diagnoses:  Active Problems:   S/P shoulder replacement   Discharge Diagnoses:  Same  Past Medical History  Diagnosis Date  . Allergy   . Chronic pain syndrome   . Knee pain   . Urination frequency   . Anemia   . Sleep apnea     CPAP  . Migraine   . Fibromyalgia   . H/O hiatal hernia   . Anxiety     takes Xanax daily as needed  . Panic attacks   . Hypertension     takes Losartan daily  . Depression     takes Latuda,Wellbutrin,Lexapro daily  . Insomnia     takes Ambien nightly  . Shortness of breath dyspnea     with exertion  . History of migraine     last one a wk ago;takes Excedrine as needed  . Weakness     left arm  . Restless leg     takes Requip nightly  . Arthritis     RA  . Joint pain   . Chronic back pain     bulding disc and arthritis.  Marland Kitchen GERD (gastroesophageal reflux disease) 01/21/2011    takes omeprazole daily  . Urinary urgency   . Urinary frequency   . Nocturia     Surgeries: Procedure(s): TOTAL SHOULDER ARTHROPLASTY on 09/27/2015   Consultants:    Discharged Condition: Improved  Hospital Course: Jasmine Huff is an 60 y.o. female who was admitted 09/27/2015 for operative treatment of left shoulder end stange osteoarthritis. Patient has severe unremitting pain that affects sleep, daily activities, and work/hobbies. After pre-op clearance the patient was taken to the operating room on 09/27/2015 and underwent  Procedure(s): TOTAL SHOULDER ARTHROPLASTY.    Patient was given perioperative antibiotics:  Anti-infectives    Start     Dose/Rate Route Frequency Ordered Stop   09/27/15 1330  ceFAZolin (ANCEF) IVPB 2g/100 mL premix     2 g over 30 Minutes Intravenous Every 6 hours 09/27/15 1243 09/28/15 0207   09/27/15 1245  hydroxychloroquine (PLAQUENIL) tablet 100-200 mg  Status:  Discontinued     100-200 mg Oral 2  times daily 09/27/15 1243 09/28/15 1635   09/27/15 0634  vancomycin (VANCOCIN) 1 GM/200ML IVPB  Status:  Discontinued    Comments:  Ancil Boozer  : cabinet override      09/27/15 0634 09/27/15 0645   09/27/15 0600  ceFAZolin (ANCEF) IVPB 2g/100 mL premix     2 g over 30 Minutes Intravenous To [facility] 09/26/15 1347 09/27/15 0746   09/27/15 0600  vancomycin (VANCOCIN) IVPB 1000 mg/200 mL premix     1,000 mg 200 mL/hr over 60 Minutes Intravenous  Once 09/26/15 1347 09/27/15 0815       Patient was given sequential compression devices, early ambulation, and ASA to prevent DVT.  Patient benefited maximally from hospital stay and there were no complications.    Recent vital signs: No data found.    Recent laboratory studies: No results for input(s): WBC, HGB, HCT, PLT, NA, K, CL, CO2, BUN, CREATININE, GLUCOSE, INR, CALCIUM in the last 72 hours.  Invalid input(s): PT, 2   Discharge Medications:     Medication List    STOP taking these medications        naproxen sodium 220 MG tablet  Commonly known as:  ANAPROX      TAKE these medications  acetaminophen 500 MG tablet  Commonly known as:  TYLENOL  Take 500 mg by mouth every 4 (four) hours as needed for moderate pain.     ALPRAZolam 1 MG tablet  Commonly known as:  XANAX  Take 0.5-1 mg by mouth 2 (two) times daily as needed for anxiety or sleep. 0.5mg  during the day, 1mg  at bedtime both as needed     buPROPion 150 MG 24 hr tablet  Commonly known as:  WELLBUTRIN XL  Take 450 mg by mouth every morning.     cetirizine 10 MG tablet  Commonly known as:  ZYRTEC  Take 10 mg by mouth every evening.     cholecalciferol 1000 units tablet  Commonly known as:  VITAMIN D  Take 1,000 Units by mouth daily.     escitalopram 20 MG tablet  Commonly known as:  LEXAPRO  Take 40 mg by mouth every morning.     hydroxychloroquine 200 MG tablet  Commonly known as:  PLAQUENIL  Take 100-200 mg by mouth 2 (two) times  daily. 200mg  in the morning, 100mg  in the evening     hydrOXYzine 25 MG tablet  Commonly known as:  ATARAX/VISTARIL  Take 25 mg by mouth 3 (three) times daily as needed for nausea.     LATUDA 60 MG Tabs  Generic drug:  Lurasidone HCl  Take 60 mg by mouth 2 (two) times daily.     losartan 100 MG tablet  Commonly known as:  COZAAR  Take 100 mg by mouth daily.     morphine 15 MG 12 hr tablet  Commonly known as:  MS CONTIN  Take 15 mg by mouth every 12 (twelve) hours.     multivitamin with minerals Tabs tablet  Take 1 tablet by mouth daily.     nystatin cream  Commonly known as:  MYCOSTATIN  Apply 1 application topically 2 (two) times daily.     omeprazole 40 MG capsule  Commonly known as:  PRILOSEC  Take 40 mg by mouth every evening.     Oxycodone HCl 10 MG Tabs  Take one 10 mg tablet every 4-6 hrs as needed for pain     rOPINIRole 3 MG tablet  Commonly known as:  REQUIP  Take 3 mg by mouth at bedtime.     zolpidem 10 MG tablet  Commonly known as:  AMBIEN  Take 10 mg by mouth at bedtime as needed for sleep.        Diagnostic Studies: Dg Shoulder Left Port  09/27/2015  CLINICAL DATA:  Left shoulder replacement. EXAM: LEFT SHOULDER - 1 VIEW COMPARISON:  CT 07/20/2015 . FINDINGS: Acromioclavicular degenerative change. Left shoulder replacement. Good anatomic alignment on AP view. Hardware intact. IMPRESSION: Left shoulder replacement.  Hardware intact.  No acute abnormality. Electronically Signed   By: Marcello Moores  Register   On: 09/27/2015 12:38    Disposition: 06-Home-Health Care Svc      Discharge Instructions    Call MD / Call 911    Complete by:  As directed   If you experience chest pain or shortness of breath, CALL 911 and be transported to the hospital emergency room.  If you develope a fever above 101 F, pus (white drainage) or increased drainage or redness at the wound, or calf pain, call your surgeon's office.     Constipation Prevention    Complete by:  As  directed   Drink plenty of fluids.  Prune juice may be helpful.  You may use a  stool softener, such as Colace (over the counter) 100 mg twice a day.  Use MiraLax (over the counter) for constipation as needed.     Diet - low sodium heart healthy    Complete by:  As directed      Increase activity slowly as tolerated    Complete by:  As directed            Follow-up Information    Follow up with Nita Sells, MD. Schedule an appointment as soon as possible for a visit in 2 weeks.   Specialty:  Orthopedic Surgery   Contact information:   [address] [place] [address] [phone]        Signed: Grier Mitts 10/22/2015, 1:47 PM

## 2015-10-23 DIAGNOSIS — Z471 Aftercare following joint replacement surgery: Secondary | ICD-10-CM | POA: Diagnosis not present

## 2015-10-23 DIAGNOSIS — Z96612 Presence of left artificial shoulder joint: Secondary | ICD-10-CM | POA: Diagnosis not present

## 2015-10-23 DIAGNOSIS — G4733 Obstructive sleep apnea (adult) (pediatric): Secondary | ICD-10-CM | POA: Diagnosis not present

## 2015-10-23 DIAGNOSIS — E119 Type 2 diabetes mellitus without complications: Secondary | ICD-10-CM | POA: Diagnosis not present

## 2015-10-23 DIAGNOSIS — M797 Fibromyalgia: Secondary | ICD-10-CM | POA: Diagnosis not present

## 2015-10-23 DIAGNOSIS — I10 Essential (primary) hypertension: Secondary | ICD-10-CM | POA: Diagnosis not present

## 2015-10-24 DIAGNOSIS — Z96612 Presence of left artificial shoulder joint: Secondary | ICD-10-CM | POA: Diagnosis not present

## 2015-10-24 DIAGNOSIS — Z471 Aftercare following joint replacement surgery: Secondary | ICD-10-CM | POA: Diagnosis not present

## 2015-10-24 DIAGNOSIS — M797 Fibromyalgia: Secondary | ICD-10-CM | POA: Diagnosis not present

## 2015-10-24 DIAGNOSIS — I10 Essential (primary) hypertension: Secondary | ICD-10-CM | POA: Diagnosis not present

## 2015-10-24 DIAGNOSIS — E119 Type 2 diabetes mellitus without complications: Secondary | ICD-10-CM | POA: Diagnosis not present

## 2015-10-24 DIAGNOSIS — G4733 Obstructive sleep apnea (adult) (pediatric): Secondary | ICD-10-CM | POA: Diagnosis not present

## 2015-10-25 DIAGNOSIS — M797 Fibromyalgia: Secondary | ICD-10-CM | POA: Diagnosis not present

## 2015-10-25 DIAGNOSIS — E119 Type 2 diabetes mellitus without complications: Secondary | ICD-10-CM | POA: Diagnosis not present

## 2015-10-25 DIAGNOSIS — I10 Essential (primary) hypertension: Secondary | ICD-10-CM | POA: Diagnosis not present

## 2015-10-25 DIAGNOSIS — Z471 Aftercare following joint replacement surgery: Secondary | ICD-10-CM | POA: Diagnosis not present

## 2015-10-25 DIAGNOSIS — G4733 Obstructive sleep apnea (adult) (pediatric): Secondary | ICD-10-CM | POA: Diagnosis not present

## 2015-10-25 DIAGNOSIS — Z96612 Presence of left artificial shoulder joint: Secondary | ICD-10-CM | POA: Diagnosis not present

## 2015-10-30 DIAGNOSIS — Z471 Aftercare following joint replacement surgery: Secondary | ICD-10-CM | POA: Diagnosis not present

## 2015-10-30 DIAGNOSIS — G4733 Obstructive sleep apnea (adult) (pediatric): Secondary | ICD-10-CM | POA: Diagnosis not present

## 2015-10-30 DIAGNOSIS — Z96612 Presence of left artificial shoulder joint: Secondary | ICD-10-CM | POA: Diagnosis not present

## 2015-10-30 DIAGNOSIS — M797 Fibromyalgia: Secondary | ICD-10-CM | POA: Diagnosis not present

## 2015-10-30 DIAGNOSIS — I10 Essential (primary) hypertension: Secondary | ICD-10-CM | POA: Diagnosis not present

## 2015-10-30 DIAGNOSIS — E119 Type 2 diabetes mellitus without complications: Secondary | ICD-10-CM | POA: Diagnosis not present

## 2015-11-01 DIAGNOSIS — M797 Fibromyalgia: Secondary | ICD-10-CM | POA: Diagnosis not present

## 2015-11-01 DIAGNOSIS — Z471 Aftercare following joint replacement surgery: Secondary | ICD-10-CM | POA: Diagnosis not present

## 2015-11-01 DIAGNOSIS — G4733 Obstructive sleep apnea (adult) (pediatric): Secondary | ICD-10-CM | POA: Diagnosis not present

## 2015-11-01 DIAGNOSIS — E119 Type 2 diabetes mellitus without complications: Secondary | ICD-10-CM | POA: Diagnosis not present

## 2015-11-01 DIAGNOSIS — I10 Essential (primary) hypertension: Secondary | ICD-10-CM | POA: Diagnosis not present

## 2015-11-01 DIAGNOSIS — Z96612 Presence of left artificial shoulder joint: Secondary | ICD-10-CM | POA: Diagnosis not present

## 2015-11-08 ENCOUNTER — Emergency Department (HOSPITAL_COMMUNITY): Payer: Medicare Other

## 2015-11-08 ENCOUNTER — Encounter (HOSPITAL_COMMUNITY): Payer: Self-pay | Admitting: Emergency Medicine

## 2015-11-08 ENCOUNTER — Observation Stay (HOSPITAL_COMMUNITY)
Admission: EM | Admit: 2015-11-08 | Discharge: 2015-11-09 | Disposition: A | Discharge status: Home / Self Care | Payer: Medicare Other | Attending: Internal Medicine | Admitting: Internal Medicine

## 2015-11-08 ENCOUNTER — Observation Stay (HOSPITAL_COMMUNITY): Payer: Medicare Other

## 2015-11-08 DIAGNOSIS — M479 Spondylosis, unspecified: Secondary | ICD-10-CM | POA: Diagnosis not present

## 2015-11-08 DIAGNOSIS — G4733 Obstructive sleep apnea (adult) (pediatric): Secondary | ICD-10-CM | POA: Diagnosis present

## 2015-11-08 DIAGNOSIS — Z6841 Body Mass Index (BMI) 40.0 and over, adult: Secondary | ICD-10-CM | POA: Insufficient documentation

## 2015-11-08 DIAGNOSIS — R402411 Glasgow coma scale score 13-15, in the field [EMT or ambulance]: Secondary | ICD-10-CM | POA: Diagnosis not present

## 2015-11-08 DIAGNOSIS — Z7902 Long term (current) use of antithrombotics/antiplatelets: Secondary | ICD-10-CM | POA: Insufficient documentation

## 2015-11-08 DIAGNOSIS — G2581 Restless legs syndrome: Secondary | ICD-10-CM | POA: Insufficient documentation

## 2015-11-08 DIAGNOSIS — M069 Rheumatoid arthritis, unspecified: Secondary | ICD-10-CM | POA: Diagnosis not present

## 2015-11-08 DIAGNOSIS — Z933 Colostomy status: Secondary | ICD-10-CM | POA: Insufficient documentation

## 2015-11-08 DIAGNOSIS — Z96619 Presence of unspecified artificial shoulder joint: Secondary | ICD-10-CM

## 2015-11-08 DIAGNOSIS — R4182 Altered mental status, unspecified: Secondary | ICD-10-CM | POA: Diagnosis not present

## 2015-11-08 DIAGNOSIS — E669 Obesity, unspecified: Secondary | ICD-10-CM | POA: Diagnosis not present

## 2015-11-08 DIAGNOSIS — G9341 Metabolic encephalopathy: Principal | ICD-10-CM | POA: Insufficient documentation

## 2015-11-08 DIAGNOSIS — F32A Depression, unspecified: Secondary | ICD-10-CM | POA: Diagnosis present

## 2015-11-08 DIAGNOSIS — Z79891 Long term (current) use of opiate analgesic: Secondary | ICD-10-CM | POA: Diagnosis not present

## 2015-11-08 DIAGNOSIS — I10 Essential (primary) hypertension: Secondary | ICD-10-CM | POA: Diagnosis not present

## 2015-11-08 DIAGNOSIS — B369 Superficial mycosis, unspecified: Secondary | ICD-10-CM | POA: Diagnosis not present

## 2015-11-08 DIAGNOSIS — F419 Anxiety disorder, unspecified: Secondary | ICD-10-CM | POA: Diagnosis not present

## 2015-11-08 DIAGNOSIS — L03116 Cellulitis of left lower limb: Secondary | ICD-10-CM | POA: Diagnosis not present

## 2015-11-08 DIAGNOSIS — R000 Tachycardia, unspecified: Secondary | ICD-10-CM | POA: Insufficient documentation

## 2015-11-08 DIAGNOSIS — Z9989 Dependence on other enabling machines and devices: Secondary | ICD-10-CM | POA: Diagnosis not present

## 2015-11-08 DIAGNOSIS — R0600 Dyspnea, unspecified: Secondary | ICD-10-CM | POA: Diagnosis not present

## 2015-11-08 DIAGNOSIS — R0902 Hypoxemia: Secondary | ICD-10-CM | POA: Diagnosis not present

## 2015-11-08 DIAGNOSIS — F329 Major depressive disorder, single episode, unspecified: Secondary | ICD-10-CM | POA: Insufficient documentation

## 2015-11-08 DIAGNOSIS — Z96612 Presence of left artificial shoulder joint: Secondary | ICD-10-CM | POA: Insufficient documentation

## 2015-11-08 DIAGNOSIS — Z79899 Other long term (current) drug therapy: Secondary | ICD-10-CM | POA: Diagnosis not present

## 2015-11-08 DIAGNOSIS — K219 Gastro-esophageal reflux disease without esophagitis: Secondary | ICD-10-CM | POA: Insufficient documentation

## 2015-11-08 DIAGNOSIS — E1165 Type 2 diabetes mellitus with hyperglycemia: Secondary | ICD-10-CM | POA: Insufficient documentation

## 2015-11-08 DIAGNOSIS — R0602 Shortness of breath: Secondary | ICD-10-CM

## 2015-11-08 LAB — CBC
HCT: 31.4 % — ABNORMAL LOW (ref 36.0–46.0)
HEMATOCRIT: 34.1 % — AB (ref 36.0–46.0)
HEMOGLOBIN: 10.3 g/dL — AB (ref 12.0–15.0)
Hemoglobin: 9.6 g/dL — ABNORMAL LOW (ref 12.0–15.0)
MCH: 24.4 pg — ABNORMAL LOW (ref 26.0–34.0)
MCH: 24.6 pg — ABNORMAL LOW (ref 26.0–34.0)
MCHC: 30.2 g/dL (ref 30.0–36.0)
MCHC: 30.6 g/dL (ref 30.0–36.0)
MCV: 80.8 fL (ref 78.0–100.0)
MCV: 80.5 fL (ref 78.0–100.0)
PLATELETS: 422 10*3/uL — AB (ref 150–400)
Platelets: 437 10*3/uL — ABNORMAL HIGH (ref 150–400)
RBC: 4.22 MIL/uL (ref 3.87–5.11)
RBC: 3.9 MIL/uL (ref 3.87–5.11)
RDW: 16.1 % — ABNORMAL HIGH (ref 11.5–15.5)
RDW: 16.3 % — AB (ref 11.5–15.5)
WBC: 12.4 10*3/uL — ABNORMAL HIGH (ref 4.0–10.5)
WBC: 10.6 10*3/uL — AB (ref 4.0–10.5)

## 2015-11-08 LAB — CREATININE, SERUM: CREATININE: 0.69 mg/dL (ref 0.44–1.00)

## 2015-11-08 LAB — BLOOD GAS, VENOUS
Acid-Base Excess: 2.3 mmol/L — ABNORMAL HIGH (ref 0.0–2.0)
Bicarbonate: 26.6 mEq/L — ABNORMAL HIGH (ref 20.0–24.0)
O2 Content: 2 L/min
O2 Saturation: 99.3 %
PCO2 VEN: 42.7 mmHg — AB (ref 45.0–50.0)
PH VEN: 7.412 — AB (ref 7.250–7.300)
Patient temperature: 98.6
TCO2: 24.5 mmol/L (ref 0–100)
pO2, Ven: 183 mmHg — ABNORMAL HIGH (ref 31.0–45.0)

## 2015-11-08 LAB — GLUCOSE, CAPILLARY
GLUCOSE-CAPILLARY: 107 mg/dL — AB (ref 65–99)
Glucose-Capillary: 127 mg/dL — ABNORMAL HIGH (ref 65–99)
Glucose-Capillary: 125 mg/dL — ABNORMAL HIGH (ref 65–99)

## 2015-11-08 LAB — URINALYSIS, ROUTINE W REFLEX MICROSCOPIC
Bilirubin Urine: NEGATIVE
Glucose, UA: NEGATIVE mg/dL
Hgb urine dipstick: NEGATIVE
Ketones, ur: NEGATIVE mg/dL
LEUKOCYTES UA: NEGATIVE
NITRITE: NEGATIVE
PH: 5.5 (ref 5.0–8.0)
Protein, ur: 30 mg/dL — AB
SPECIFIC GRAVITY, URINE: 1.03 (ref 1.005–1.030)

## 2015-11-08 LAB — HEPATIC FUNCTION PANEL
ALT: 15 U/L (ref 14–54)
AST: 22 U/L (ref 15–41)
Albumin: 3.2 g/dL — ABNORMAL LOW (ref 3.5–5.0)
Alkaline Phosphatase: 82 U/L (ref 38–126)
BILIRUBIN DIRECT: 0.1 mg/dL (ref 0.1–0.5)
BILIRUBIN INDIRECT: 0.6 mg/dL (ref 0.3–0.9)
BILIRUBIN TOTAL: 0.7 mg/dL (ref 0.3–1.2)
Total Protein: 6.8 g/dL (ref 6.5–8.1)

## 2015-11-08 LAB — ACETAMINOPHEN LEVEL
ACETAMINOPHEN (TYLENOL), SERUM: 11 ug/mL (ref 10–30)
Acetaminophen (Tylenol), Serum: <10 ug/mL — ABNORMAL LOW (ref 10–30)

## 2015-11-08 LAB — COMPREHENSIVE METABOLIC PANEL
ALBUMIN: 3.7 g/dL (ref 3.5–5.0)
ALK PHOS: 88 U/L (ref 38–126)
ALT: 16 U/L (ref 14–54)
AST: 24 U/L (ref 15–41)
Anion gap: 12 (ref 5–15)
BUN: 11 mg/dL (ref 6–20)
CALCIUM: 8.8 mg/dL — AB (ref 8.9–10.3)
CO2: 25 mmol/L (ref 22–32)
CREATININE: 0.81 mg/dL (ref 0.44–1.00)
Chloride: 103 mmol/L (ref 101–111)
GFR calc Af Amer: >60 mL/min (ref >60)
GFR calc non Af Amer: >60 mL/min (ref >60)
GLUCOSE: 153 mg/dL — AB (ref 65–99)
Potassium: 3.8 mmol/L (ref 3.5–5.1)
SODIUM: 140 mmol/L (ref 135–145)
Total Bilirubin: 0.6 mg/dL (ref 0.3–1.2)
Total Protein: 7.4 g/dL (ref 6.5–8.1)

## 2015-11-08 LAB — RAPID URINE DRUG SCREEN, HOSP PERFORMED
AMPHETAMINES: NOT DETECTED
BARBITURATES: NOT DETECTED
Benzodiazepines: POSITIVE — AB
COCAINE: NOT DETECTED
Opiates: POSITIVE — AB
TETRAHYDROCANNABINOL: NOT DETECTED

## 2015-11-08 LAB — URINE MICROSCOPIC-ADD ON

## 2015-11-08 LAB — VITAMIN B12: VITAMIN B 12: 498 pg/mL (ref 180–914)

## 2015-11-08 LAB — CBG MONITORING, ED: Glucose-Capillary: 146 mg/dL — ABNORMAL HIGH (ref 65–99)

## 2015-11-08 LAB — AMMONIA: AMMONIA: 17 umol/L (ref 9–35)

## 2015-11-08 LAB — PHOSPHORUS: Phosphorus: 3.4 mg/dL (ref 2.5–4.6)

## 2015-11-08 LAB — ETHANOL: Alcohol, Ethyl (B): <5 mg/dL (ref <5)

## 2015-11-08 LAB — I-STAT CG4 LACTIC ACID, ED: LACTIC ACID, VENOUS: 1.88 mmol/L (ref 0.5–2.0)

## 2015-11-08 LAB — TSH: TSH: 0.812 u[IU]/mL (ref 0.350–4.500)

## 2015-11-08 LAB — SALICYLATE LEVEL: Salicylate Lvl: <4 mg/dL (ref 2.8–30.0)

## 2015-11-08 LAB — MAGNESIUM: Magnesium: 1.4 mg/dL — ABNORMAL LOW (ref 1.7–2.4)

## 2015-11-08 MED ORDER — SODIUM CHLORIDE 0.9% FLUSH
3.0000 mL | Freq: Two times a day (BID) | INTRAVENOUS | Status: DC
  Administered 2015-11-08: 3 mL via INTRAVENOUS

## 2015-11-08 MED ORDER — ENOXAPARIN SODIUM 80 MG/0.8ML ~~LOC~~ SOLN
80.0000 mg | SUBCUTANEOUS | Status: DC
  Administered 2015-11-08: 80 mg via SUBCUTANEOUS
  Filled 2015-11-08 (×3): qty 0.8

## 2015-11-08 MED ORDER — VANCOMYCIN HCL 10 G IV SOLR: 1250.0000 mg | Freq: Two times a day (BID) | INTRAVENOUS | Status: DC

## 2015-11-08 MED ORDER — HYDRALAZINE HCL 20 MG/ML IJ SOLN: 10.0000 mg | Freq: Three times a day (TID) | INTRAMUSCULAR | Status: DC | PRN

## 2015-11-08 MED ORDER — CEFAZOLIN SODIUM-DEXTROSE 2-4 GM/100ML-% IV SOLN
2.0000 g | Freq: Three times a day (TID) | INTRAVENOUS | Status: DC
  Administered 2015-11-08 – 2015-11-09 (×3): 2 g via INTRAVENOUS
  Filled 2015-11-08 (×5): qty 100

## 2015-11-08 MED ORDER — OXYCODONE HCL 10 MG PO TABS: 5.0000 mg | ORAL_TABLET | Freq: Four times a day (QID) | ORAL | Status: DC | PRN

## 2015-11-08 MED ORDER — VANCOMYCIN HCL 10 G IV SOLR
2500.0000 mg | Freq: Once | INTRAVENOUS | Status: AC
  Administered 2015-11-08: 2500 mg via INTRAVENOUS
  Filled 2015-11-08: qty 2500

## 2015-11-08 MED ORDER — LOSARTAN POTASSIUM 50 MG PO TABS
100.0000 mg | ORAL_TABLET | Freq: Every day | ORAL | Status: DC
  Administered 2015-11-08 – 2015-11-09 (×2): 100 mg via ORAL
  Filled 2015-11-08 (×2): qty 2

## 2015-11-08 MED ORDER — SODIUM CHLORIDE 0.9 % IV BOLUS (SEPSIS)
1000.0000 mL | Freq: Once | INTRAVENOUS | Status: AC
  Administered 2015-11-08: 1000 mL via INTRAVENOUS

## 2015-11-08 MED ORDER — HYDROXYCHLOROQUINE SULFATE 200 MG PO TABS: 100.0000 mg | ORAL_TABLET | Freq: Two times a day (BID) | ORAL | Status: DC

## 2015-11-08 MED ORDER — PIPERACILLIN-TAZOBACTAM 3.375 G IVPB 30 MIN
3.3750 g | Freq: Once | INTRAVENOUS | Status: AC
  Administered 2015-11-08: 3.375 g via INTRAVENOUS
  Filled 2015-11-08: qty 50

## 2015-11-08 MED ORDER — ALBUTEROL SULFATE (2.5 MG/3ML) 0.083% IN NEBU
5.0000 mg | INHALATION_SOLUTION | Freq: Once | RESPIRATORY_TRACT | Status: AC
  Administered 2015-11-08: 5 mg via RESPIRATORY_TRACT
  Filled 2015-11-08: qty 6

## 2015-11-08 MED ORDER — ROPINIROLE HCL 1 MG PO TABS
3.0000 mg | ORAL_TABLET | Freq: Every day | ORAL | Status: DC
  Administered 2015-11-08: 3 mg via ORAL
  Filled 2015-11-08: qty 3

## 2015-11-08 MED ORDER — LORATADINE 10 MG PO TABS
10.0000 mg | ORAL_TABLET | Freq: Every day | ORAL | Status: DC
  Administered 2015-11-08 – 2015-11-09 (×2): 10 mg via ORAL
  Filled 2015-11-08 (×2): qty 1

## 2015-11-08 MED ORDER — VITAMIN D3 25 MCG (1000 UNIT) PO TABS
1000.0000 [IU] | ORAL_TABLET | Freq: Every day | ORAL | Status: DC
  Administered 2015-11-08 – 2015-11-09 (×2): 1000 [IU] via ORAL
  Filled 2015-11-08 (×2): qty 1

## 2015-11-08 MED ORDER — HYDROXYCHLOROQUINE SULFATE 200 MG PO TABS
200.0000 mg | ORAL_TABLET | Freq: Every day | ORAL | Status: DC
  Administered 2015-11-08 – 2015-11-09 (×2): 200 mg via ORAL
  Filled 2015-11-08 (×2): qty 1

## 2015-11-08 MED ORDER — SODIUM CHLORIDE 0.9 % IV SOLN
INTRAVENOUS | Status: DC
  Administered 2015-11-08: 12:00:00 via INTRAVENOUS

## 2015-11-08 MED ORDER — HYDROXYCHLOROQUINE SULFATE 200 MG PO TABS
100.0000 mg | ORAL_TABLET | Freq: Every day | ORAL | Status: DC
  Administered 2015-11-08: 100 mg via ORAL
  Filled 2015-11-08: qty 0.5

## 2015-11-08 MED ORDER — ESCITALOPRAM OXALATE 20 MG PO TABS
40.0000 mg | ORAL_TABLET | Freq: Every morning | ORAL | Status: DC
  Administered 2015-11-08 – 2015-11-09 (×2): 40 mg via ORAL
  Filled 2015-11-08 (×2): qty 2

## 2015-11-08 MED ORDER — OXYCODONE HCL 5 MG PO TABS
5.0000 mg | ORAL_TABLET | Freq: Four times a day (QID) | ORAL | Status: DC | PRN
  Administered 2015-11-08 – 2015-11-09 (×4): 10 mg via ORAL
  Filled 2015-11-08 (×4): qty 2

## 2015-11-08 MED ORDER — LURASIDONE HCL 60 MG PO TABS
60.0000 mg | ORAL_TABLET | Freq: Two times a day (BID) | ORAL | Status: DC
  Administered 2015-11-08 – 2015-11-09 (×2): 60 mg via ORAL
  Filled 2015-11-08 (×2): qty 1

## 2015-11-08 MED ORDER — ALPRAZOLAM 0.5 MG PO TABS: 0.5000 mg | ORAL_TABLET | Freq: Two times a day (BID) | ORAL | Status: DC | PRN

## 2015-11-08 MED ORDER — SODIUM CHLORIDE 0.9 % IV BOLUS (SEPSIS)
1000.0000 mL | Freq: Once | INTRAVENOUS | Status: AC
Start: 2015-11-08 — End: 2015-11-08
  Administered 2015-11-08: 1000 mL via INTRAVENOUS

## 2015-11-08 MED ORDER — PANTOPRAZOLE SODIUM 40 MG PO TBEC
40.0000 mg | DELAYED_RELEASE_TABLET | Freq: Every day | ORAL | Status: DC
  Administered 2015-11-08 – 2015-11-09 (×2): 40 mg via ORAL
  Filled 2015-11-08 (×2): qty 1

## 2015-11-08 MED ORDER — VANCOMYCIN HCL IN DEXTROSE 1-5 GM/200ML-% IV SOLN: 1000.0000 mg | Freq: Once | INTRAVENOUS | Status: DC

## 2015-11-08 MED ORDER — INSULIN ASPART 100 UNIT/ML ~~LOC~~ SOLN
0.0000 [IU] | Freq: Three times a day (TID) | SUBCUTANEOUS | Status: DC
  Administered 2015-11-08 (×2): 1 [IU] via SUBCUTANEOUS

## 2015-11-08 MED ORDER — SACCHAROMYCES BOULARDII 250 MG PO CAPS
250.0000 mg | ORAL_CAPSULE | Freq: Two times a day (BID) | ORAL | Status: DC
  Administered 2015-11-08 – 2015-11-09 (×3): 250 mg via ORAL
  Filled 2015-11-08 (×3): qty 1

## 2015-11-08 MED ORDER — BUPROPION HCL ER (XL) 150 MG PO TB24: 450.0000 mg | ORAL_TABLET | Freq: Every morning | ORAL | Status: DC

## 2015-11-08 MED ORDER — ONDANSETRON HCL 4 MG PO TABS: 4.0000 mg | ORAL_TABLET | Freq: Four times a day (QID) | ORAL | Status: DC | PRN

## 2015-11-08 MED ORDER — ONDANSETRON HCL 4 MG/2ML IJ SOLN: 4.0000 mg | Freq: Four times a day (QID) | INTRAMUSCULAR | Status: DC | PRN

## 2015-11-08 MED ORDER — NYSTATIN 100000 UNIT/GM EX CREA
1.0000 "application " | TOPICAL_CREAM | Freq: Two times a day (BID) | CUTANEOUS | Status: DC
  Administered 2015-11-08: 1 via TOPICAL
  Filled 2015-11-08: qty 15

## 2015-11-08 MED ORDER — NYSTATIN 100000 UNIT/GM EX POWD
Freq: Once | CUTANEOUS | Status: AC
  Administered 2015-11-08: 06:00:00 via TOPICAL
  Filled 2015-11-08: qty 60

## 2015-11-08 MED ORDER — ADULT MULTIVITAMIN W/MINERALS CH
1.0000 | ORAL_TABLET | Freq: Every day | ORAL | Status: DC
  Administered 2015-11-08 – 2015-11-09 (×2): 1 via ORAL
  Filled 2015-11-08 (×2): qty 1

## 2015-11-08 MED ORDER — BUPROPION HCL ER (XL) 300 MG PO TB24
300.0000 mg | ORAL_TABLET | Freq: Every morning | ORAL | Status: DC
  Administered 2015-11-09: 300 mg via ORAL
  Filled 2015-11-08: qty 1

## 2015-11-08 MED ORDER — ACETAMINOPHEN 500 MG PO TABS
500.0000 mg | ORAL_TABLET | ORAL | Status: DC | PRN
  Administered 2015-11-08 – 2015-11-09 (×3): 500 mg via ORAL
  Filled 2015-11-08 (×3): qty 1

## 2015-11-08 MED ORDER — PANTOPRAZOLE SODIUM 40 MG PO TBEC: 40.0000 mg | DELAYED_RELEASE_TABLET | Freq: Every day | ORAL | Status: DC

## 2015-11-08 MED ORDER — PIPERACILLIN-TAZOBACTAM 3.375 G IVPB
3.3750 g | Freq: Three times a day (TID) | INTRAVENOUS | Status: DC
  Filled 2015-11-08: qty 50

## 2015-11-08 NOTE — ED Provider Notes (Signed)
CSN: [id number]     Arrival date & time 11/08/15  0138 History  By signing my name below, I, [facility], attest that this documentation has been prepared under the direction and in the presence of Sharlett Iles, MD. Electronically Signed: Virgel Bouquet, ED Scribe. 11/08/2015. 4:10 AM.   Chief Complaint  Patient presents with  . Altered Mental Status  . Possible ingestion   The history is provided by the patient and medical records. The history is limited by the condition of the patient. No language interpreter was used.   LEVEL V CAVEAT DUE TO ALTERED MENTAL STATUS HPI Comments: Hollyanne Splan is a 60 y.o. female with an hx of anxiety, depression, and HTN who presents to the Emergency Department for altered mental status and for possible ingestion shortly PTA. Pt states that she fell earlier tonight and struck the back of her head. She denies LOC. She notes a hx of restless leg syndrome and took morphine, oxycodone, Aleve, Requip tonight to relieve her pain. Per EMS, pt presents from home where she lives with her brother. EMS states that the brother found the pt in her room surrounded by empty bottles and pills all over the floor but that they did not read the labels. EMS notes that both the pt and brother were poor historians and that it was unclear whether the pt overdosed tonight and whether self-harm was intended.They note a hx of intentional drug overdose in the past.    Past Medical History  Diagnosis Date  . Allergy   . Chronic pain syndrome   . Knee pain   . Urination frequency   . Anemia   . Sleep apnea     CPAP  . Migraine   . Fibromyalgia   . H/O hiatal hernia   . Anxiety     takes Xanax daily as needed  . Panic attacks   . Hypertension     takes Losartan daily  . Depression     takes Latuda,Wellbutrin,Lexapro daily  . Insomnia     takes Ambien nightly  . Shortness of breath dyspnea     with exertion  . History of migraine     last one a wk  ago;takes Excedrine as needed  . Weakness     left arm  . Restless leg     takes Requip nightly  . Arthritis     RA  . Joint pain   . Chronic back pain     bulding disc and arthritis.  Marland Kitchen GERD (gastroesophageal reflux disease) 01/21/2011    takes omeprazole daily  . Urinary urgency   . Urinary frequency   . Nocturia    Past Surgical History  Procedure Laterality Date  . Abdominal hysterectomy    . Right ovary removal    . Left hemicolectomy with splenic flexure mobilization and end colostomy  04/04/2012  . Laparotomy  04/04/2012    Procedure: EXPLORATORY LAPAROTOMY;  Surgeon: Leighton Ruff, MD;  Location: [facility];  Service: General;  Laterality: N/A;  left colectomy  . Colostomy  04/04/2012    Procedure: COLOSTOMY;  Surgeon: Leighton Ruff, MD;  Location: [facility];  Service: General;  Laterality: N/A;  . Colonoscopy N/A 11/12/2012    Procedure: COLONOSCOPY;  Surgeon: Leighton Ruff, MD;  Location: [facility];  Service: Endoscopy;  Laterality: N/A;  . Colon surgery  Oct 2013    perf bowel w/ colostomy  . Cholecystectomy  10 yrs ago  . Colostomy reversal N/A 02/17/2013  Procedure:  LAPAROSCOPIC COLOSTOMY REVERSAL; SMALL BOWEL RESECTION; LYSIS OF ADHESIONS ;  Surgeon: Leighton Ruff, MD;  Location: [facility];  Service: General;  Laterality: N/A;  . Colonoscopy    . Refractive surgery    . Total shoulder arthroplasty Left 09/27/2015    Procedure: TOTAL SHOULDER ARTHROPLASTY;  Surgeon: Tania Ade, MD;  Location: [facility];  Service: Orthopedics;  Laterality: Left;  Left shoulder arthroplasty   Family History  Problem Relation Age of Onset  . Depression Mother   . Depression Father   . Alcohol abuse Brother   . Depression Brother   . Depression Maternal Grandfather   . Depression Other    Social History  Substance Use Topics  . Smoking status: Never Smoker   . Smokeless tobacco: Never Used  . Alcohol Use: No   OB History    No data available     Review of Systems  Unable to  perform ROS: Mental status change   Allergies  Asenapine; Saphris; Seroquel; Iodinated diagnostic agents; Pregabalin; and Buprenorphine  Home Medications   Prior to Admission medications   Medication Sig Start Date End Date Taking? Authorizing Provider  acetaminophen (TYLENOL) 500 MG tablet Take 500 mg by mouth every 4 (four) hours as needed for moderate pain.    Historical Provider, MD  ALPRAZolam Duanne Moron) 1 MG tablet Take 0.5-1 mg by mouth 2 (two) times daily as needed for anxiety or sleep. 0.5mg  during the day, 1mg  at bedtime both as needed 11/06/12   Historical Provider, MD  buPROPion (WELLBUTRIN XL) 150 MG 24 hr tablet Take 450 mg by mouth every morning.    Historical Provider, MD  cetirizine (ZYRTEC) 10 MG tablet Take 10 mg by mouth every evening.     Historical Provider, MD  cholecalciferol (VITAMIN D) 1000 units tablet Take 1,000 Units by mouth daily.    Historical Provider, MD  escitalopram (LEXAPRO) 20 MG tablet Take 40 mg by mouth every morning.  10/12/12   Historical Provider, MD  hydroxychloroquine (PLAQUENIL) 200 MG tablet Take 100-200 mg by mouth 2 (two) times daily. 200mg  in the morning, 100mg  in the evening 08/04/11   Historical Provider, MD  hydrOXYzine (ATARAX/VISTARIL) 25 MG tablet Take 25 mg by mouth 3 (three) times daily as needed for nausea.    Historical Provider, MD  losartan (COZAAR) 100 MG tablet Take 100 mg by mouth daily.    Historical Provider, MD  Lurasidone HCl (LATUDA) 60 MG TABS Take 60 mg by mouth 2 (two) times daily.    Historical Provider, MD  morphine (MS CONTIN) 15 MG 12 hr tablet Take 15 mg by mouth every 12 (twelve) hours.    Historical Provider, MD  Multiple Vitamin (MULTIVITAMIN WITH MINERALS) TABS tablet Take 1 tablet by mouth daily.    Historical Provider, MD  nystatin cream (MYCOSTATIN) Apply 1 application topically 2 (two) times daily.    Historical Provider, MD  omeprazole (PRILOSEC) 40 MG capsule Take 40 mg by mouth every evening.  11/16/12   Historical  Provider, MD  Oxycodone HCl 10 MG TABS Take one 10 mg tablet every 4-6 hrs as needed for pain 09/28/15   Grier Mitts, PA-C  rOPINIRole (REQUIP) 3 MG tablet Take 3 mg by mouth at bedtime.    Historical Provider, MD  zolpidem (AMBIEN) 10 MG tablet Take 10 mg by mouth at bedtime as needed for sleep.    Historical Provider, MD   BP 124/106 mmHg  Pulse 107  Temp(Src) 100.3 F (37.9 C) (Rectal)  Resp 20  Wt 360 lb 0.2 oz (163.3 kg)  SpO2 98% Physical Exam  Constitutional: She appears well-developed and well-nourished.  Jittery, occasionally jerking in bed  HENT:  Head: Normocephalic and atraumatic.  Moist mucous membranes  Eyes: Conjunctivae are normal. Pupils are equal, round, and reactive to light.  Pupils dilated B/L  Neck: Neck supple.  Cardiovascular: Regular rhythm and normal heart sounds.  Tachycardia present.   No murmur heard. Pulmonary/Chest: Breath sounds normal.  Tachypnea occasionally with periods of normal breathing, wheezing from upper airway with no wheezing in lungs  Abdominal: Soft. Bowel sounds are normal. She exhibits no distension. There is no tenderness.  Musculoskeletal: She exhibits edema (mild b/l LE).  Neurological:  Opens eyes to voice with prompting, confused and occasionally talking about non-sensical things; moving all 4 extremities, mildly increased muscle tone  Skin: Skin is warm and dry.  Mild erythema L lower leg; ecchymoses b/l buttocks; candida in skin folds in groin and under panus   Nursing note and vitals reviewed.   ED Course  .Critical Care Performed by: Sharlett Iles Authorized by: Sharlett Iles Critical care was necessary to treat or prevent imminent or life-threatening deterioration of the following conditions: toxidrome and CNS failure or compromise. Critical care was time spent personally by me on the following activities: development of treatment plan with patient or surrogate, evaluation of patient's response to  treatment, examination of patient, obtaining history from patient or surrogate, ordering and performing treatments and interventions, ordering and review of laboratory studies, ordering and review of radiographic studies, re-evaluation of patient's condition and review of old charts.     DIAGNOSTIC STUDIES: Oxygen Saturation is 100% on RA, normal by my interpretation.    COORDINATION OF CARE: 3:30 AM Discussed treatment plan with pt at bedside and pt agreed to plan.   Labs Review Labs Reviewed  COMPREHENSIVE METABOLIC PANEL - Abnormal; Notable for the following:    Glucose, Bld 153 (*)    Calcium 8.8 (*)    All other components within normal limits  CBC - Abnormal; Notable for the following:    WBC 12.4 (*)    Hemoglobin 10.3 (*)    HCT 34.1 (*)    MCH 24.4 (*)    RDW 16.1 (*)    Platelets 437 (*)    All other components within normal limits  URINE RAPID DRUG SCREEN, HOSP PERFORMED - Abnormal; Notable for the following:    Opiates POSITIVE (*)    Benzodiazepines POSITIVE (*)    All other components within normal limits  BLOOD GAS, VENOUS - Abnormal; Notable for the following:    pH, Ven 7.412 (*)    pCO2, Ven 42.7 (*)    pO2, Ven 183.0 (*)    Bicarbonate 26.6 (*)    Acid-Base Excess 2.3 (*)    All other components within normal limits  URINALYSIS, ROUTINE W REFLEX MICROSCOPIC (NOT AT [facility]) - Abnormal; Notable for the following:    Protein, ur 30 (*)    All other components within normal limits  URINE MICROSCOPIC-ADD ON - Abnormal; Notable for the following:    Squamous Epithelial / LPF 0-5 (*)    Bacteria, UA RARE (*)    All other components within normal limits  CBG MONITORING, ED - Abnormal; Notable for the following:    Glucose-Capillary 146 (*)    All other components within normal limits  URINE CULTURE  CULTURE, BLOOD (ROUTINE X 2)  CULTURE, BLOOD (ROUTINE X 2)  ETHANOL  SALICYLATE LEVEL  ACETAMINOPHEN LEVEL  I-STAT CG4 LACTIC ACID, ED    Imaging Review Dg  Chest Port 1 View  11/08/2015  CLINICAL DATA:  Dyspnea and wheezing EXAM: PORTABLE CHEST 1 VIEW COMPARISON:  09/17/2015 FINDINGS: Rotated positioning and superimposed soft tissues limit the exam. No dense consolidation. No large effusion. No pneumothorax. IMPRESSION: Limited study, negative for dense consolidation or large effusion. No pneumothorax. Electronically Signed   By: Andreas Newport M.D.   On: 11/08/2015 02:50   I have personally reviewed and evaluated these lab results as part of my medical decision-making.   EKG Interpretation   Date/Time:  Thursday Nov 08 2015 01:54:10 EDT Ventricular Rate:  106 PR Interval:  171 QRS Duration: 143 QT Interval:  384 QTC Calculation: 510 R Axis:   157 Text Interpretation:  Sinus tachycardia Right bundle branch block No  significant change since last tracing Confirmed by Makiah Foye MD, Kacyn Souder  [phone]) on 11/08/2015 2:46:27 AM     Medications  sodium chloride 0.9 % bolus 1,000 mL (1,000 mLs Intravenous New Bag/Given 11/08/15 0526)    And  sodium chloride 0.9 % bolus 1,000 mL (1,000 mLs Intravenous New Bag/Given 11/08/15 0541)    And  sodium chloride 0.9 % bolus 1,000 mL (1,000 mLs Intravenous New Bag/Given 11/08/15 0546)    And  sodium chloride 0.9 % bolus 1,000 mL (not administered)    And  sodium chloride 0.9 % bolus 1,000 mL (not administered)  piperacillin-tazobactam (ZOSYN) IVPB 3.375 g (3.375 g Intravenous New Bag/Given 11/08/15 0550)  vancomycin (VANCOCIN) 2,500 mg in sodium chloride 0.9 % 500 mL IVPB (not administered)  piperacillin-tazobactam (ZOSYN) IVPB 3.375 g (not administered)  albuterol (PROVENTIL) (2.5 MG/3ML) 0.083% nebulizer solution 5 mg (5 mg Nebulization Given 11/08/15 0218)  nystatin (MYCOSTATIN) powder ( Topical Given 11/08/15 0542)    MDM   Final diagnoses:  Altered mental status, unspecified altered mental status type  Left leg cellulitis  Patient with extensive medical history presents by EMS after she was found altered at  home with pills scattered everywhere, h/o overdose. On exam, she was tachycardic and occasionally  tachypneic with periods of normal work of breathing. She was occasionally jerking and talking to herself and appeared hyperadrenergic. EKG showed sinus tachycardia with no significant changes from previous EKG. Patient had mild erythema and warmth of left lower leg, no obvious trauma to head. Obtained chest x-ray which was unremarkable. Obtained above lab work which showed normal lactate, venous pH 7.41 with CO2 42, UDS positive for opiates and benzodiazepines. WBC 12.4, hemoglobin 10.3. I have ordered head CT given altered mental status but we are unable to obtain  currently due to patient's twitching and jerking movements; I do not want to give her any sedating medications given concern for polysubstance overdose.  Pt later developed borderline fever of 100.3. Given erythematous L leg suggestive of cellulitis, initiated sepsis protocol with vancomycin and Zosyn as well as IV fluids. I considered encephalitis/meningitis however I feel this is less likely given the EMS report of multiple medications scattered on the floor around the patient and her report of taking multiple medications tonight, in combination with a physical exam consistent with toxidrome. Her symptoms are similar to benadryl overdose, serotonin syndrome less likely given no clonus but SS or NMS possible given medication list. Poison control had recommended bicarb for QT prolongation but I reviewed her EKG from last month and it is not significantly changed today, therefore I held on giving bicarb. I discussed admission w/  hospitalist, Dr. Hal Hope, and pt will be admitted to stepdown for further work up.   I personally performed the services described in this documentation, which was scribed in my presence. The recorded information has been reviewed and is accurate.    Sharlett Iles, MD 11/08/15 [phone]

## 2015-11-08 NOTE — ED Notes (Signed)
Patient transported to CT 

## 2015-11-08 NOTE — ED Notes (Addendum)
Pt from home with complaints of altered mental status. Pt lives with her brother. Per EMS, to was found sitting in her room surrounded with empty pill bottles, and pills were all over the floor. EMS states they did not read the labels of the bottles. Per EMS, both pt and brother are poor historians. Per EMS, pt has intentionally overdosed before. It is unclear if pt has overdosed and if it was intentional and for self harm if she did

## 2015-11-08 NOTE — ED Notes (Signed)
Patient remains restless in bed, occ talking to her dog, reoriented to her environment.  Incontinent of soft brown stool, cleaned, patient with very poor hygiene, clean gown.  MP ST with no ectopy.

## 2015-11-08 NOTE — Progress Notes (Signed)
PT Cancellation Note  Patient Details Name: Arcilia Aebersold MRN: [id number] DOB: 05-28-1956   Cancelled Treatment:    Reason Eval/Treat Not Completed: Fatigue/lethargy limiting ability to participate;Pain limiting ability to participate (states she wants to sleep.)   [place], [place] 11/08/2015, 5:05 PM Tresa Endo PT [phone]

## 2015-11-08 NOTE — Progress Notes (Signed)
Pharmacy Antibiotic Note  Jasmine Huff is a 60 y.o. female admitted on 11/08/2015 with sepsis.   Patient arrived to ED with altered mental status and possible medication overdose.  Pt with h/o overdose.  Patient tachycardic, elevated WBC, Temp 100.3   Pharmacy has been consulted for Vancomycin & Zosyn dosing.  Plan: Vancomycin 2500mg  IV x 1 followed by 1250mg  IV every 12 hours.  Goal trough 15-20 mcg/mL. Zosyn 3.375g IV q8h (4 hour infusion). F/u cultures  Weight: (!) 360 lb 0.2 oz (163.3 kg)  Temp (24hrs), Avg:100 F (37.8 C), Min:99.6 F (37.6 C), Max:100.3 F (37.9 C)   Recent Labs Lab 11/08/15 0141 11/08/15 0251  WBC 12.4*  --   CREATININE 0.81  --   LATICACIDVEN  --  1.88    Estimated Creatinine Clearance: 114.3 mL/min (by C-G formula based on Cr of 0.81).    Allergies  Allergen Reactions  . Asenapine Anaphylaxis, Shortness Of Breath and Other (See Comments)    Lips tingle and tongue becomes numb/Akasthesia  . Saphris [Asenapine Maleate] Anaphylaxis, Shortness Of Breath and Other (See Comments)    Lips tingle and tongue becomes numb/Akasthesia  . Seroquel [Quetiapine Fumarate] Shortness Of Breath and Other (See Comments)    "out of breath"  . Iodinated Diagnostic Agents Hives    Dye used for CT scans, severe hives  . Pregabalin Rash and Other (See Comments)    Alters Mental status, flushing  Caused by lyrica  . Buprenorphine Rash    Antimicrobials this admission: 5/4 Vanc >>   5/4 Zosyn >>    Dose adjustments this admission:    Microbiology results: 5/4 BCx:   5/4 UCx:     Thank you for allowing pharmacy to be a part of this patient's care.  Everette Rank, PharmD 11/08/2015 5:32 AM

## 2015-11-08 NOTE — Progress Notes (Signed)
Pt states she does not wear CPAP at home and does not wish to start wearing here.  Pt encouraged to let RN know if she changes her mind.

## 2015-11-08 NOTE — ED Notes (Addendum)
Per PC 1-2 milliequivalent of bicarb/kg for QRS interval and supportive care.

## 2015-11-08 NOTE — ED Notes (Signed)
Pt can go up at 10:03 am

## 2015-11-08 NOTE — Plan of Care (Signed)
As per the ER physician Dr. Rex Kras patient was brought to the ER after patient was off on patient confused and multiple pill bottles around him. In the ER. Mildly febrile confused and having myoclonic jerks. There was some extremity erythema concerning for cellulitis. ER physician thinks patient's symptoms are more likely secondary to drug overdose. CT of the head blood cultures and ammonia levels are pending. Patient was started on antibiotics for possible cellulitis of the lower extremity. Patient will be admitted to stepdown unit.  Jasmine Huff.

## 2015-11-08 NOTE — Plan of Care (Signed)
Problem: Education: Goal: Knowledge of [facility] Education information/materials will improve Outcome: Completed/Met Date Met:  11/08/15 Plan of care discussed with patient

## 2015-11-08 NOTE — Plan of Care (Signed)
Problem: Safety: Goal: Ability to remain free from injury will improve Outcome: Completed/Met Date Met:  11/08/15 Safety and fall prevention reviewed. Pt agreed to call prior to getting out of bed for assistance

## 2015-11-08 NOTE — ED Notes (Signed)
Redness and warmth noted to LLE

## 2015-11-08 NOTE — ED Notes (Signed)
EKG given to EDP,Little,MD., for review. 

## 2015-11-08 NOTE — ED Notes (Signed)
Pt incontinent of large amt of soft stool  Pt cleaned and pericare performed

## 2015-11-08 NOTE — H&P (Signed)
History and Physical    Marshawn Arner [id number] DOB: 08-04-1955 DOA: 11/08/2015  Referring Provider: Dr. Rex Kras PCP: Lilian Coma, MD   Patient coming from: home  Chief Complaint: AMS, LLE erythema/swelling  HPI: Jasmine Huff is a 60 y.o. female with PMH significant for obesity, RA, GERD, OSA, GERD, essential HTN, depression/anxiety and RLS; who presented to ED by EMS after she was found confused and altered at home. There was empty bottles and pills all over the place around her. On my exam she was oriented X3 and confused into why she ended at the ED. Patient reported LLE redness and swelling for the last 2 days and endorses remembering knocking her bottles on the floor out of her nightstand. She denies SI, overusing meds; denies CP, SOB, nausea, vomiting, dysuria, fever, chills, melena, hematochezia, hematemesis, photophobia, neck stiffness or any other complaints. While in ED she has low grade temp.  ED Course: patient was confused and more obtunded on her presentation; given concerns for sepsis, she was started on IVF's resuscitation, received broad spectrum antibiotics and had CT scan of her head (which was negative). [facility] called to admit patient for further evaluation and treatment. Patient with low grade temp and mild leukocytosis; HR 99 and RR 20.  Review of Systems:  All other systems reviewed and apart from HPI, are negative.  Past Medical History  Diagnosis Date  . Allergy   . Chronic pain syndrome   . Knee pain   . Urination frequency   . Anemia   . Sleep apnea     CPAP  . Migraine   . Fibromyalgia   . H/O hiatal hernia   . Anxiety     takes Xanax daily as needed  . Panic attacks   . Hypertension     takes Losartan daily  . Depression     takes Latuda,Wellbutrin,Lexapro daily  . Insomnia     takes Ambien nightly  . Shortness of breath dyspnea     with exertion  . History of migraine     last one a wk ago;takes Excedrine as needed  . Weakness     left  arm  . Restless leg     takes Requip nightly  . Arthritis     RA  . Joint pain   . Chronic back pain     bulding disc and arthritis.  Marland Kitchen GERD (gastroesophageal reflux disease) 01/21/2011    takes omeprazole daily  . Urinary urgency   . Urinary frequency   . Nocturia     Past Surgical History  Procedure Laterality Date  . Abdominal hysterectomy    . Right ovary removal    . Left hemicolectomy with splenic flexure mobilization and end colostomy  04/04/2012  . Laparotomy  04/04/2012    Procedure: EXPLORATORY LAPAROTOMY;  Surgeon: Leighton Ruff, MD;  Location: [facility];  Service: General;  Laterality: N/A;  left colectomy  . Colostomy  04/04/2012    Procedure: COLOSTOMY;  Surgeon: Leighton Ruff, MD;  Location: [facility];  Service: General;  Laterality: N/A;  . Colonoscopy N/A 11/12/2012    Procedure: COLONOSCOPY;  Surgeon: Leighton Ruff, MD;  Location: [facility];  Service: Endoscopy;  Laterality: N/A;  . Colon surgery  Oct 2013    perf bowel w/ colostomy  . Cholecystectomy  10 yrs ago  . Colostomy reversal N/A 02/17/2013    Procedure:  LAPAROSCOPIC COLOSTOMY REVERSAL; SMALL BOWEL RESECTION; LYSIS OF ADHESIONS ;  Surgeon: Leighton Ruff, MD;  Location: [facility];  Service: General;  Laterality: N/A;  . Colonoscopy    . Refractive surgery    . Total shoulder arthroplasty Left 09/27/2015    Procedure: TOTAL SHOULDER ARTHROPLASTY;  Surgeon: Tania Ade, MD;  Location: [facility];  Service: Orthopedics;  Laterality: Left;  Left shoulder arthroplasty     reports that she has never smoked. She has never used smokeless tobacco. She reports that she does not drink alcohol or use illicit drugs.  Allergies  Allergen Reactions  . Asenapine Anaphylaxis, Shortness Of Breath and Other (See Comments)    Lips tingle and tongue becomes numb/Akasthesia  . Saphris [Asenapine Maleate] Anaphylaxis, Shortness Of Breath and Other (See Comments)    Lips tingle and tongue becomes numb/Akasthesia  . Seroquel  [Quetiapine Fumarate] Shortness Of Breath and Other (See Comments)    "out of breath"  . Iodinated Diagnostic Agents Hives    Dye used for CT scans, severe hives  . Pregabalin Rash and Other (See Comments)    Alters Mental status, flushing  Caused by lyrica  . Buprenorphine Rash    Family History  Problem Relation Age of Onset  . Depression Mother   . Depression Father   . Alcohol abuse Brother   . Depression Brother   . Depression Maternal Grandfather   . Depression Other      Prior to Admission medications   Medication Sig Start Date End Date Taking? Authorizing Provider  acetaminophen (TYLENOL) 500 MG tablet Take 500 mg by mouth every 4 (four) hours as needed for moderate pain.    Historical Provider, MD  ALPRAZolam Duanne Moron) 1 MG tablet Take 0.5-1 mg by mouth 2 (two) times daily as needed for anxiety or sleep. 0.5mg  during the day, 1mg  at bedtime both as needed 11/06/12   Historical Provider, MD  buPROPion (WELLBUTRIN XL) 150 MG 24 hr tablet Take 450 mg by mouth every morning.    Historical Provider, MD  cetirizine (ZYRTEC) 10 MG tablet Take 10 mg by mouth every evening.     Historical Provider, MD  cholecalciferol (VITAMIN D) 1000 units tablet Take 1,000 Units by mouth daily.    Historical Provider, MD  escitalopram (LEXAPRO) 20 MG tablet Take 40 mg by mouth every morning.  10/12/12   Historical Provider, MD  hydroxychloroquine (PLAQUENIL) 200 MG tablet Take 100-200 mg by mouth 2 (two) times daily. 200mg  in the morning, 100mg  in the evening 08/04/11   Historical Provider, MD  hydrOXYzine (ATARAX/VISTARIL) 25 MG tablet Take 25 mg by mouth 3 (three) times daily as needed for nausea.    Historical Provider, MD  losartan (COZAAR) 100 MG tablet Take 100 mg by mouth daily.    Historical Provider, MD  Lurasidone HCl (LATUDA) 60 MG TABS Take 60 mg by mouth 2 (two) times daily.    Historical Provider, MD  morphine (MS CONTIN) 15 MG 12 hr tablet Take 15 mg by mouth every 12 (twelve) hours.     Historical Provider, MD  Multiple Vitamin (MULTIVITAMIN WITH MINERALS) TABS tablet Take 1 tablet by mouth daily.    Historical Provider, MD  nystatin cream (MYCOSTATIN) Apply 1 application topically 2 (two) times daily.    Historical Provider, MD  omeprazole (PRILOSEC) 40 MG capsule Take 40 mg by mouth every evening.  11/16/12   Historical Provider, MD  Oxycodone HCl 10 MG TABS Take one 10 mg tablet every 4-6 hrs as needed for pain 09/28/15   Grier Mitts, PA-C  rOPINIRole (REQUIP) 3 MG tablet Take 3 mg by mouth  at bedtime.    Historical Provider, MD  zolpidem (AMBIEN) 10 MG tablet Take 10 mg by mouth at bedtime as needed for sleep.    Historical Provider, MD    Physical Exam: Filed Vitals:   11/08/15 0425 11/08/15 0527 11/08/15 0649 11/08/15 0744  BP:   183/92 147/96  Pulse:   106 99  Temp: 100.3 F (37.9 C)     TempSrc: Rectal     Resp:   24 21  Weight:  163.3 kg (360 lb 0.2 oz)    SpO2:   97% 98%   Constitutional: NAD, comfortable now. Oriented X3, but with slight confusion about events and how/why she ended in the hospital for. No CP, able to speak in full sentences. Eyes: PERRLA, lids and conjunctivae normal, no icterus and no nystagmus  ENMT: Mucous membranes are moist. Posterior pharynx clear of any exudate or lesions. Normal dentition and no thrush Neck: normal, supple, no masses, no thyromegaly Respiratory: clear to auscultation bilaterally, slight upper airway wheezing, no crackles. Cardiovascular: S1 & S2 heard, regular rate and rhythm, no murmurs / rubs / gallops. LLE edema (1+). 2+ pedal pulses. No carotid bruits.  Abdomen: No distension, no tenderness, no masses palpated. Bowel sounds normal.  Musculoskeletal: no clubbing / cyanosis. Good ROM, no contractures. Normal muscle tone. LLE erythema and swelling; no pain and neg Hoffman sign. Skin: positive fungal infection on her groin area and panus; positive mid line scar in her abdomen (with slight erythema; according to  patient chronic); patient with LLE erythema and swelling. No other open wounds appreciated. Neurologic: CN 2-12 grossly intact. Sensation intact, no focal neurologic deficit appreciated. Psychiatric: Normal judgment and just slight confuse about events. Alert and oriented x 3. Normal mood and denies SI or hallucinations.   Labs on Admission: I have personally reviewed following labs and imaging studies  CBC:  Recent Labs Lab 11/08/15 0141  WBC 12.4*  HGB 10.3*  HCT 34.1*  MCV 80.8  PLT 99991111*   Basic Metabolic Panel:  Recent Labs Lab 11/08/15 0141  NA 140  K 3.8  CL 103  CO2 25  GLUCOSE 153*  BUN 11  CREATININE 0.81  CALCIUM 8.8*   GFR: Estimated Creatinine Clearance: 114.3 mL/min (by C-G formula based on Cr of 0.81).   Liver Function Tests:  Recent Labs Lab 11/08/15 0141  AST 24  ALT 16  ALKPHOS 88  BILITOT 0.6  PROT 7.4  ALBUMIN 3.7    Recent Labs Lab 11/08/15 0733  AMMONIA 17   CBG:  Recent Labs Lab 11/08/15 0147  GLUCAP 146*   Urine analysis:    Component Value Date/Time   COLORURINE YELLOW 11/08/2015 0339   APPEARANCEUR CLEAR 11/08/2015 0339   LABSPEC 1.030 11/08/2015 0339   PHURINE 5.5 11/08/2015 0339   GLUCOSEU NEGATIVE 11/08/2015 0339   HGBUR NEGATIVE 11/08/2015 0339   BILIRUBINUR NEGATIVE 11/08/2015 0339   KETONESUR NEGATIVE 11/08/2015 0339   PROTEINUR 30* 11/08/2015 0339   UROBILINOGEN 0.2 06/14/2013 1048   NITRITE NEGATIVE 11/08/2015 0339   LEUKOCYTESUR NEGATIVE 11/08/2015 0339   Radiological Exams on Admission: Ct Head Wo Contrast  11/08/2015  CLINICAL DATA:  60 year old female with altered mental status, possible fall EXAM: CT HEAD WITHOUT CONTRAST TECHNIQUE: Contiguous axial images were obtained from the base of the skull through the vertex without intravenous contrast. COMPARISON:  None. FINDINGS: Negative for acute intracranial hemorrhage, acute infarction, mass, mass effect, hydrocephalus or midline shift. Gray-white  differentiation is preserved throughout. No acute soft  tissue or calvarial abnormality. The globes and orbits are symmetric and unremarkable. Normal aeration of the mastoid air cells and visualized paranasal sinuses. IMPRESSION: Negative head CT. Electronically Signed   By: Jacqulynn Cadet M.D.   On: 11/08/2015 08:20   Dg Chest Port 1 View  11/08/2015  CLINICAL DATA:  Dyspnea and wheezing EXAM: PORTABLE CHEST 1 VIEW COMPARISON:  09/17/2015 FINDINGS: Rotated positioning and superimposed soft tissues limit the exam. No dense consolidation. No large effusion. No pneumothorax. IMPRESSION: Limited study, negative for dense consolidation or large effusion. No pneumothorax. Electronically Signed   By: Andreas Newport M.D.   On: 11/08/2015 02:50    EKG:  Sinus tachycardia, normal axis; positive QTc prolongation (essentially unchanged from prior study); no acute ischemic changes appreciated.  Assessment/Plan 1-Metabolic encephalopathy/Altered mental status: most likely medication related -even patient with signs of cellulitis, no frank toxic appearance and just meeting SIRS criteria on admission  -CT head neg, normal ammonia  -will check B12, RPR and HIV -minimize benzodiazepine and the use of narcotics -mentation is already much improved; on my exam AAOX3 (with some confusion about events and why she ended in the hospital) -follow poison control rec's, will monitor on telemetry and will repeat tylenol level  2-Hypertension: -stable overall -will continue home antihypertensive regimen  -PRN hydralazine ordered as well   3-Obesity: -low calorie diet and exercise discussed with patient -she expressed physical activity is difficult for her; but will try -Body mass index is 63.79 kg/(m^2).  -given BMI, will benefit of outpatient evaluation at bariatric clinic  4-Depression/anxiety: denies SI or hallucinations -will continue home antidepressant regimen and reduce PRN dose of xanax  5-GERD  (gastroesophageal reflux disease): -will continue PPI  6-OSA (obstructive sleep apnea): -will continue CPAP QHS  7-S/P shoulder replacement: in March of 2017 -continue outpatient follow up with orthopedic service as needed -per patient still with limitations, but improving -PT evaluation requested   8-Left leg cellulitis: moderate disease and w/o suppuration  -fallowing cellulitis orders set, will treat with ancef -follow clinical response and follow WBC's trend -no sepsis on admission    9-Fungal infection of skin of abdomen: -will treat with topical nystatin -area needs to be kept clean and dry.   10-hyperglycemia: with hx of type 2 diabetes -currently on diet control only -will check A1C -will use SSI while inpatient  11-RA: will continue plaquenil   DVT prophylaxis: Lovenox  Code Status: Full Code Family Communication: no family at bedside  Disposition Plan: anticipate discharge home in approx 2 days or so, if not complications  Consults called: none  Admission status: observation, telemetry, LOS < 2 midnights    Barton Dubois MD [facility] Pager [phone]  If 7PM-7AM, please contact night-coverage [email or web address] Password [email or web address]  11/08/2015, 9:22 AM

## 2015-11-08 NOTE — ED Notes (Signed)
Bed: WA18 Expected date:  Expected time:  Means of arrival:  Comments: EMS 

## 2015-11-08 NOTE — ED Notes (Signed)
Malachy Mood, RN/Poison Control called and recommended a repeat acetaminophen level.

## 2015-11-09 DIAGNOSIS — R4182 Altered mental status, unspecified: Secondary | ICD-10-CM | POA: Diagnosis not present

## 2015-11-09 DIAGNOSIS — I10 Essential (primary) hypertension: Secondary | ICD-10-CM

## 2015-11-09 DIAGNOSIS — K219 Gastro-esophageal reflux disease without esophagitis: Secondary | ICD-10-CM | POA: Diagnosis not present

## 2015-11-09 DIAGNOSIS — B369 Superficial mycosis, unspecified: Secondary | ICD-10-CM | POA: Diagnosis not present

## 2015-11-09 DIAGNOSIS — G4733 Obstructive sleep apnea (adult) (pediatric): Secondary | ICD-10-CM

## 2015-11-09 DIAGNOSIS — L03116 Cellulitis of left lower limb: Secondary | ICD-10-CM

## 2015-11-09 DIAGNOSIS — F329 Major depressive disorder, single episode, unspecified: Secondary | ICD-10-CM | POA: Diagnosis not present

## 2015-11-09 DIAGNOSIS — Z96619 Presence of unspecified artificial shoulder joint: Secondary | ICD-10-CM

## 2015-11-09 DIAGNOSIS — E669 Obesity, unspecified: Secondary | ICD-10-CM

## 2015-11-09 LAB — CBC
HCT: 30.2 % — ABNORMAL LOW (ref 36.0–46.0)
HEMOGLOBIN: 9.6 g/dL — AB (ref 12.0–15.0)
MCH: 25.2 pg — AB (ref 26.0–34.0)
MCHC: 31.8 g/dL (ref 30.0–36.0)
MCV: 79.3 fL (ref 78.0–100.0)
PLATELETS: 389 10*3/uL (ref 150–400)
RBC: 3.81 MIL/uL — AB (ref 3.87–5.11)
RDW: 16.3 % — ABNORMAL HIGH (ref 11.5–15.5)
WBC: 11.9 10*3/uL — AB (ref 4.0–10.5)

## 2015-11-09 LAB — URINE CULTURE
Culture: NO GROWTH
SPECIAL REQUESTS: NORMAL

## 2015-11-09 LAB — RPR: RPR Ser Ql: NONREACTIVE

## 2015-11-09 LAB — GLUCOSE, CAPILLARY: GLUCOSE-CAPILLARY: 119 mg/dL — AB (ref 65–99)

## 2015-11-09 LAB — HIV ANTIBODY (ROUTINE TESTING W REFLEX): HIV Screen 4th Generation wRfx: NONREACTIVE

## 2015-11-09 LAB — BASIC METABOLIC PANEL
ANION GAP: 8 (ref 5–15)
BUN: 7 mg/dL (ref 6–20)
CALCIUM: 8.2 mg/dL — AB (ref 8.9–10.3)
CO2: 26 mmol/L (ref 22–32)
CREATININE: 0.75 mg/dL (ref 0.44–1.00)
Chloride: 107 mmol/L (ref 101–111)
GFR calc non Af Amer: >60 mL/min (ref >60)
Glucose, Bld: 149 mg/dL — ABNORMAL HIGH (ref 65–99)
Potassium: 3.9 mmol/L (ref 3.5–5.1)
SODIUM: 141 mmol/L (ref 135–145)

## 2015-11-09 LAB — HEMOGLOBIN A1C
HEMOGLOBIN A1C: 7 % — AB (ref 4.8–5.6)
Mean Plasma Glucose: 154 mg/dL

## 2015-11-09 MED ORDER — CEFUROXIME AXETIL 500 MG PO TABS: 500.0000 mg | ORAL_TABLET | Freq: Two times a day (BID) | ORAL | Status: DC

## 2015-11-09 NOTE — Care Management Obs Status (Signed)
[facility] NOTIFICATION   Patient Details  Name: Jasmine Huff MRN: [id number] Date of Birth: May 29, 1956   Medicare Observation Status Notification Given:  Yes    MahabirJuliann Pulse, RN 11/09/2015, 11:02 AM

## 2015-11-09 NOTE — Care Management Note (Signed)
Case Management Note  Patient Details  Name: Jasmine Huff MRN: [id number] Date of Birth: 1955/12/16  Subjective/Objective:                    Action/Plan:d/c home no needs or orders.   Expected Discharge Date:   (UNKNOWN)               Expected Discharge Plan:  Home/Self Care  In-House Referral:     Discharge planning Services  CM Consult  Post Acute Care Choice:    Choice offered to:     DME Arranged:    DME Agency:     HH Arranged:    [place] Agency:     Status of Service:  Completed, signed off  Medicare Important Message Given:    Date Medicare IM Given:    Medicare IM give by:    Date Additional Medicare IM Given:    Additional Medicare Important Message give by:     If discussed at [place] of Stay Meetings, dates discussed:    Additional Comments:  Dessa Phi, RN 11/09/2015, 12:19 PM

## 2015-11-09 NOTE — Progress Notes (Signed)
Pt. Given discharge education and summary. All questions answered. 2 IV's removed by NT. CCMD called before d/c telemetry. Pt. Discharged home with brother. NT wheeled pt. Down to brothers car.

## 2015-11-09 NOTE — Discharge Summary (Signed)
Physician Discharge Summary  Aliana Laverne [id number] DOB: 28-Jun-1956 DOA: 11/08/2015  PCP: Lilian Coma, MD  Admit date: 11/08/2015 Discharge date: 11/09/2015  Time spent: 45 minutes  Recommendations for Outpatient Follow-up:  Patient will be discharged to home.  Patient will need to follow up with primary care provider within one week of discharge.  Patient should continue medications as prescribed.  Patient should follow a heart healthy diet.   Discharge Diagnoses:  Metabolic encephalopathy Essential hypertension Obesity Depression anxiety GERD Objective sleep apnea Status post shoulder replacement Left lower extreme cellulitis Abdominal fungal infection Diabetes mellitus, type II Rheumatoid arthritis  Discharge Condition: Stable  Diet recommendation: heart healthy  Filed Weights   11/08/15 0527 11/08/15 1033 11/09/15 0500  Weight: 163.3 kg (360 lb 0.2 oz) 164.5 kg (362 lb 10.5 oz) 163.5 kg (360 lb 7.2 oz)    History of present illness:  On 11/08/2015 by Dr. Barton Dubois Jasmine Huff is a 60 y.o. female with PMH significant for obesity, RA, GERD, OSA, GERD, essential HTN, depression/anxiety and RLS; who presented to ED by EMS after she was found confused and altered at home. There was empty bottles and pills all over the place around her. On my exam she was oriented X3 and confused into why she ended at the ED. Patient reported LLE redness and swelling for the last 2 days and endorses remembering knocking her bottles on the floor out of her nightstand. She denies SI, overusing meds; denies CP, SOB, nausea, vomiting, dysuria, fever, chills, melena, hematochezia, hematemesis, photophobia, neck stiffness or any other complaints. While in ED she has low grade temp.  Hospital Course:  Metabolic encephalopathy/Altered mental status -most likely medication related (polypharmacy) vs infection related -Resolved -CT head unremarkable -Patient afebrile on admission -even patient  with signs of cellulitis, no frank toxic appearance and just meeting SIRS criteria on admission  -CT head neg, normal ammonia  -Vitamin B12: 498, RPR and HIV nonreactive -Acetaminophen level less than 10 -Blood cultures pending  -UA unremarkable for infection, urine culture pending  Hypertension -Stable continue home meds  Obesity -low calorie diet and exercise discussed with patient -she expressed physical activity is difficult for her; but will try -Body mass index is 63.79 kg/(m^2).  -given BMI, will benefit of outpatient evaluation at bariatric clinic  Depression/anxiety -denies SI or hallucinations -continue home antidepressant regimen and reduce PRN dose of xanax  GERD (gastroesophageal reflux disease) -continue PPI  OSA (obstructive sleep apnea) -will continue CPAP QHS (patient refused CPAP)  S/P shoulder replacement: in March of 2017 -continue outpatient follow up with orthopedic service as needed -per patient still with limitations, but improving -PT consulted and recommended home health, patient refused  Hypoxia with ambulation -O2 sats dropped to 87-88% on room air with ambulation -Patient refused home oxygen.  Left lower extremity cellulitis -Was placed on Ancef, will discharge with ceftin -Patient was afebrile upon admission  Fungal infection of skin of abdomen -Continue topical nystatin -area needs to be kept clean and dry  Diabetes mellitus, type II with hyperglycemia -currently on diet control only -Hemoglobin A1c 7 -Patient should discuss life style modifications and diabetes management with PCP upon discharge  Rheumatoid arthritis -continue plaquenil   Procedures: None  Consultations: None  Discharge Exam: Filed Vitals:   11/08/15 2022 11/09/15 0436  BP: 160/83 158/80  Pulse: 94 97  Temp: 98.1 F (36.7 C) 99.7 F (37.6 C)  Resp: 20 20     General: Well developed, well nourished, anxious  HEENT: NCAT,mucous  membranes  moist.  Cardiovascular: S1 S2 auscultated, no rubs, murmurs or gallops. Regular rate and rhythm.  Respiratory: Clear to auscultation bilaterally with equal chest rise  Abdomen: Soft, obese, nontender, nondistended, + bowel sounds  Extremities: warm dry without cyanosis clubbing, mild LLE edema  Neuro: AAOx3, nonfocal  Skin: LLE- mild erythema, no drainage.  Abdominal/pannus fungal type rash.  Psych: Anxious and tearful  Discharge Instructions      Discharge Instructions    Discharge instructions    Complete by:  As directed   Patient will be discharged to home.  Patient will need to follow up with primary care provider within one week of discharge.  Patient should continue medications as prescribed.  Patient should follow a heart healthy diet.            Medication List    TAKE these medications        acetaminophen 500 MG tablet  Commonly known as:  TYLENOL  Take 1,000 mg by mouth every 6 (six) hours as needed for moderate pain.     ALPRAZolam 0.5 MG tablet  Commonly known as:  XANAX  Take 0.5 mg by mouth daily as needed for anxiety.     buPROPion 150 MG 24 hr tablet  Commonly known as:  WELLBUTRIN XL  Take 450 mg by mouth every morning.     cefUROXime 500 MG tablet  Commonly known as:  CEFTIN  Take 1 tablet (500 mg total) by mouth 2 (two) times daily with a meal.     cetirizine 10 MG tablet  Commonly known as:  ZYRTEC  Take 10 mg by mouth every evening.     escitalopram 20 MG tablet  Commonly known as:  LEXAPRO  Take 40 mg by mouth every morning.     hydroxychloroquine 200 MG tablet  Commonly known as:  PLAQUENIL  Take 100-200 mg by mouth 2 (two) times daily. 200mg  in the morning, 100mg  in the evening     hydrOXYzine 25 MG tablet  Commonly known as:  ATARAX/VISTARIL  Take 25 mg by mouth every 8 (eight) hours as needed for nausea.     LATUDA 120 MG Tabs  Generic drug:  Lurasidone HCl  Take 120 mg by mouth daily.     losartan 100 MG tablet   Commonly known as:  COZAAR  Take 100 mg by mouth daily.     morphine 30 MG 12 hr tablet  Commonly known as:  MS CONTIN  Take 30 mg by mouth 2 (two) times daily.     multivitamin with minerals Tabs tablet  Take 1 tablet by mouth daily.     naproxen sodium 220 MG tablet  Commonly known as:  ANAPROX  Take 220 mg by mouth every 12 (twelve) hours as needed (for pain).     nystatin cream  Commonly known as:  MYCOSTATIN  Apply 1 application topically 2 (two) times daily. Uses for yeast infection     omeprazole 40 MG capsule  Commonly known as:  PRILOSEC  Take 40 mg by mouth every evening.     Oxycodone HCl 10 MG Tabs  Take one 10 mg tablet every 4-6 hrs as needed for pain     rOPINIRole 3 MG tablet  Commonly known as:  REQUIP  Take 3 mg by mouth at bedtime.     Vitamin D3 5000 units Tabs  Take 5,000 Units by mouth daily.     zolpidem 10 MG tablet  Commonly known as:  AMBIEN  Take 10 mg by mouth at bedtime as needed for sleep.       Allergies  Allergen Reactions  . Asenapine Anaphylaxis, Shortness Of Breath and Other (See Comments)    Lips tingle and tongue becomes numb/Akasthesia  . Saphris [Asenapine Maleate] Anaphylaxis, Shortness Of Breath and Other (See Comments)    Lips tingle and tongue becomes numb/Akasthesia  . Seroquel [Quetiapine Fumarate] Shortness Of Breath and Other (See Comments)    "out of breath"  . Iodinated Diagnostic Agents Hives    Dye used for CT scans, severe hives  . Pregabalin Rash and Other (See Comments)    Alters Mental status, flushing  Caused by lyrica  . Buprenorphine Rash   Follow-up Information    Follow up with Lilian Coma, MD. Schedule an appointment as soon as possible for a visit in 1 week.   Specialty:  Family Medicine   Why:  Hospital follow up   Contact information:   [address] [place] [address] [phone]        The results of significant diagnostics from this hospitalization  (including imaging, microbiology, ancillary and laboratory) are listed below for reference.    Significant Diagnostic Studies: Ct Head Wo Contrast  11/08/2015  CLINICAL DATA:  60 year old female with altered mental status, possible fall EXAM: CT HEAD WITHOUT CONTRAST TECHNIQUE: Contiguous axial images were obtained from the base of the skull through the vertex without intravenous contrast. COMPARISON:  None. FINDINGS: Negative for acute intracranial hemorrhage, acute infarction, mass, mass effect, hydrocephalus or midline shift. Gray-white differentiation is preserved throughout. No acute soft tissue or calvarial abnormality. The globes and orbits are symmetric and unremarkable. Normal aeration of the mastoid air cells and visualized paranasal sinuses. IMPRESSION: Negative head CT. Electronically Signed   By: Jacqulynn Cadet M.D.   On: 11/08/2015 08:20   Dg Chest Port 1 View  11/08/2015  CLINICAL DATA:  Dyspnea and wheezing EXAM: PORTABLE CHEST 1 VIEW COMPARISON:  09/17/2015 FINDINGS: Rotated positioning and superimposed soft tissues limit the exam. No dense consolidation. No large effusion. No pneumothorax. IMPRESSION: Limited study, negative for dense consolidation or large effusion. No pneumothorax. Electronically Signed   By: Andreas Newport M.D.   On: 11/08/2015 02:50    Microbiology: No results found for this or any previous visit (from the past 240 hour(s)).   Labs: Basic Metabolic Panel:  Recent Labs Lab 11/08/15 0141 11/08/15 1122 11/09/15 0512  NA 140  --  141  K 3.8  --  3.9  CL 103  --  107  CO2 25  --  26  GLUCOSE 153*  --  149*  BUN 11  --  7  CREATININE 0.81 0.69 0.75  CALCIUM 8.8*  --  8.2*  MG  --  1.4*  --   PHOS  --  3.4  --    Liver Function Tests:  Recent Labs Lab 11/08/15 0141 11/08/15 1122  AST 24 22  ALT 16 15  ALKPHOS 88 82  BILITOT 0.6 0.7  PROT 7.4 6.8  ALBUMIN 3.7 3.2*   No results for input(s): LIPASE, AMYLASE in the last 168  hours.  Recent Labs Lab 11/08/15 0733  AMMONIA 17   CBC:  Recent Labs Lab 11/08/15 0141 11/08/15 1122 11/09/15 0512  WBC 12.4* 10.6* 11.9*  HGB 10.3* 9.6* 9.6*  HCT 34.1* 31.4* 30.2*  MCV 80.8 80.5 79.3  PLT 437* 422* 389   Cardiac Enzymes: No results for input(s): CKTOTAL, CKMB, CKMBINDEX, TROPONINI  in the last 168 hours. BNP: BNP (last 3 results) No results for input(s): BNP in the last 8760 hours.  ProBNP (last 3 results) No results for input(s): PROBNP in the last 8760 hours.  CBG:  Recent Labs Lab 11/08/15 0147 11/08/15 1159 11/08/15 1715 11/08/15 2019 11/09/15 0734  GLUCAP 146* 127* 125* 107* 119*       Signed:  Cristal Ford  [facility] 11/09/2015, 11:49 AM

## 2015-11-09 NOTE — Discharge Instructions (Signed)
Cellulitis °Cellulitis is an infection of the skin and the tissue beneath it. The infected area is usually red and tender. Cellulitis occurs most often in the arms and lower legs.  °CAUSES  °Cellulitis is caused by bacteria that enter the skin through cracks or cuts in the skin. The most common types of bacteria that cause cellulitis are staphylococci and streptococci. °SIGNS AND SYMPTOMS  °· Redness and warmth. °· Swelling. °· Tenderness or pain. °· Fever. °DIAGNOSIS  °Your health care provider can usually determine what is wrong based on a physical exam. Blood tests may also be done. °TREATMENT  °Treatment usually involves taking an antibiotic medicine. °HOME CARE INSTRUCTIONS  °· Take your antibiotic medicine as directed by your health care provider. Finish the antibiotic even if you start to feel better. °· Keep the infected arm or leg elevated to reduce swelling. °· Apply a warm cloth to the affected area up to 4 times per day to relieve pain. °· Take medicines only as directed by your health care provider. °· Keep all follow-up visits as directed by your health care provider. °SEEK MEDICAL CARE IF:  °· You notice red streaks coming from the infected area. °· Your red area gets larger or turns dark in color. °· Your bone or joint underneath the infected area becomes painful after the skin has healed. °· Your infection returns in the same area or another area. °· You notice a swollen bump in the infected area. °· You develop new symptoms. °· You have a fever. °SEEK IMMEDIATE MEDICAL CARE IF:  °· You feel very sleepy. °· You develop vomiting or diarrhea. °· You have a general ill feeling (malaise) with muscle aches and pains. °  °This information is not intended to replace advice given to you by your health care provider. Make sure you discuss any questions you have with your health care provider. °  °Document Released: 04/02/2005 Document Revised: 03/14/2015 Document Reviewed: 09/08/2011 °Elsevier Interactive  Patient Education ©2016 [employer] ° °Cellulitis °Cellulitis is an infection of the skin and the tissue beneath it. The infected area is usually red and tender. Cellulitis occurs most often in the arms and lower legs.  °CAUSES  °Cellulitis is caused by bacteria that enter the skin through cracks or cuts in the skin. The most common types of bacteria that cause cellulitis are staphylococci and streptococci. °SIGNS AND SYMPTOMS  °· Redness and warmth. °· Swelling. °· Tenderness or pain. °· Fever. °DIAGNOSIS  °Your health care provider can usually determine what is wrong based on a physical exam. Blood tests may also be done. °TREATMENT  °Treatment usually involves taking an antibiotic medicine. °HOME CARE INSTRUCTIONS  °· Take your antibiotic medicine as directed by your health care provider. Finish the antibiotic even if you start to feel better. °· Keep the infected arm or leg elevated to reduce swelling. °· Apply a warm cloth to the affected area up to 4 times per day to relieve pain. °· Take medicines only as directed by your health care provider. °· Keep all follow-up visits as directed by your health care provider. °SEEK MEDICAL CARE IF:  °· You notice red streaks coming from the infected area. °· Your red area gets larger or turns dark in color. °· Your bone or joint underneath the infected area becomes painful after the skin has healed. °· Your infection returns in the same area or another area. °· You notice a swollen bump in the infected area. °· You develop new symptoms. °·   You have a fever. °SEEK IMMEDIATE MEDICAL CARE IF:  °· You feel very sleepy. °· You develop vomiting or diarrhea. °· You have a general ill feeling (malaise) with muscle aches and pains. °  °This information is not intended to replace advice given to you by your health care provider. Make sure you discuss any questions you have with your health care provider. °  °Document Released: 04/02/2005 Document Revised: 03/14/2015 Document  Reviewed: 09/08/2011 °Elsevier Interactive Patient Education ©2016 [employer] ° °

## 2015-11-09 NOTE — Evaluation (Signed)
Physical Therapy Evaluation Patient Details Name: Jasmine Huff MRN: [id number] DOB: [birth year] Today's Date: 11/09/2015   History of Present Illness  60 yo female admitted with AMS. Hx of anxiety, depression, HTN, chronic pain syndrome, fibromyalgia, RLS, obesity  Clinical Impression  On eval, pt required Min assist for bed mobility and Min guard assist for ambulation. Pt only able to tolerate short distance ~25 feet with RW. Dyspnea 3/4. O2 sats 87-88% on RA-made RN aware. Pt declined HHPT follow up.     Follow Up Recommendations No PT follow up;Supervision/Assistance - 24 hour (pt politely declined HHPT. Pt states she begins OP PT for shoulder next week)    Equipment Recommendations  None recommended by PT    Recommendations for Other Services       Precautions / Restrictions Precautions Precautions: Fall Restrictions Weight Bearing Restrictions: No      Mobility  Bed Mobility Overal bed mobility: Needs Assistance Bed Mobility: Supine to Sit;Sit to Supine     Supine to sit: Min assist;HOB elevated Sit to supine: Min assist;HOB elevated   General bed mobility comments: assist for LEs. Effortful for pt  Transfers Overall transfer level: Needs assistance Equipment used: Rolling walker (2 wheeled) Transfers: Sit to/from Stand Sit to Stand: Min guard         General transfer comment: close guard for safety  Ambulation/Gait Ambulation/Gait assistance: Min guard Ambulation Distance (Feet): 25 Feet Assistive device: Rolling walker (2 wheeled) Gait Pattern/deviations: Step-through pattern;Decreased stride length     General Gait Details: close guard for safety. dyspnea 3/4. fatigues easily with short distance. Pt declined to ambulate farther.   Stairs            Wheelchair Mobility    Modified Rankin (Stroke Patients Only)       Balance                                             Pertinent Vitals/Pain Pain Assessment: 0-10 Pain  Score: 9  Pain Location: bil knees Pain Descriptors / Indicators: Sore;Aching Pain Intervention(s): Repositioned    Home Living Family/patient expects to be discharged to:: Private residence Living Arrangements: Other relatives Available Help at Discharge: Family;Available 24 hours/day (for now) Type of Home: House Home Access: Stairs to enter   [employer] of Steps: 1 Home Layout: One level Home Equipment: Shower seat;Hand held shower head;Grab bars - tub/shower;Walker - 4 wheels      Prior Function Level of Independence: Independent with assistive device(s)         Comments: used walker     Hand Dominance        Extremity/Trunk Assessment   Upper Extremity Assessment: Generalized weakness           Lower Extremity Assessment: Generalized weakness      Cervical / Trunk Assessment: Normal  Communication   Communication: No difficulties  Cognition Arousal/Alertness: Awake/alert Behavior During Therapy: WFL for tasks assessed/performed Overall Cognitive Status: Within Functional Limits for tasks assessed                      General Comments      Exercises        Assessment/Plan    PT Assessment Patient needs continued PT services  PT Diagnosis Difficulty walking;Generalized weakness   PT Problem List Decreased strength;Decreased activity tolerance;Decreased balance;Decreased mobility;Obesity  PT Treatment Interventions  Gait training;Functional mobility training;Therapeutic activities;Patient/family education;Therapeutic exercise   PT Goals (Current goals can be found in the Care Plan section) Acute Rehab PT Goals Patient Stated Goal: home today PT Goal Formulation: With patient Time For Goal Achievement: 11/23/15 Potential to Achieve Goals: Fair    Frequency     Barriers to discharge        Co-evaluation               End of Session   Activity Tolerance: Patient limited by fatigue Patient left: in bed;with call  bell/phone within reach      Functional Assessment Tool Used: clinical judgement Functional Limitation: Mobility: Walking and moving around Mobility: Walking and Moving Around Current Status [id number]): At least 1 percent but less than 20 percent impaired, limited or restricted Mobility: Walking and Moving Around Goal Status [phone]): At least 1 percent but less than 20 percent impaired, limited or restricted Mobility: Walking and Moving Around Discharge Status [phone]): At least 1 percent but less than 20 percent impaired, limited or restricted    Time: 1123-1138 PT Time Calculation (min) (ACUTE ONLY): 15 min   Charges:   PT Evaluation $PT Eval Low Complexity: 1 Procedure     PT G Codes:   PT G-Codes **NOT FOR INPATIENT CLASS** Functional Assessment Tool Used: clinical judgement Functional Limitation: Mobility: Walking and moving around Mobility: Walking and Moving Around Current Status [id number]): At least 1 percent but less than 20 percent impaired, limited or restricted Mobility: Walking and Moving Around Goal Status [phone]): At least 1 percent but less than 20 percent impaired, limited or restricted Mobility: Walking and Moving Around Discharge Status [phone]): At least 1 percent but less than 20 percent impaired, limited or restricted    Weston Anna, MPT Pager: [phone]

## 2015-11-09 NOTE — Progress Notes (Signed)
SATURATION QUALIFICATIONS: (This note is used to comply with regulatory documentation for home oxygen)  Patient Saturations on Room Air at Rest = 94%  Patient Saturations on Room Air while Ambulating = 87%  Patient Saturations on 2 Liters of oxygen while Ambulating = 95%  Please briefly explain why patient needs home oxygen: Pt. Ambulated with PT. O2 saturations went down to 87-88% while ambulating on RA, went back up to 90%. Pt. And brother educated of home O2 qualifications, pt. Refused home O2 and HHPT. MD aware.

## 2015-11-09 NOTE — Progress Notes (Signed)
SATURATION QUALIFICATIONS: (This note is used to comply with regulatory documentation for home oxygen)  Patient Saturations on Room Air at Rest = 94%  Patient Saturations on Room Air while Ambulating = 90%  Patient Saturations on  2 Liters of oxygen while Ambulating = 95  Please briefly explain why patient needs home oxygen: Pt. Tolerated ambulation well, no c/o SOB

## 2015-11-12 ENCOUNTER — Encounter (HOSPITAL_COMMUNITY): Payer: Self-pay | Admitting: Emergency Medicine

## 2015-11-12 ENCOUNTER — Inpatient Hospital Stay (HOSPITAL_COMMUNITY)
Admission: EM | Admit: 2015-11-12 | Discharge: 2015-11-16 | DRG: 193 | Disposition: A | Discharge status: Home Health | Payer: Medicare Other | Attending: Internal Medicine | Admitting: Internal Medicine

## 2015-11-12 ENCOUNTER — Emergency Department (HOSPITAL_COMMUNITY): Payer: Medicare Other

## 2015-11-12 DIAGNOSIS — Z9119 Patient's noncompliance with other medical treatment and regimen: Secondary | ICD-10-CM

## 2015-11-12 DIAGNOSIS — Z96611 Presence of right artificial shoulder joint: Secondary | ICD-10-CM | POA: Diagnosis present

## 2015-11-12 DIAGNOSIS — Z91041 Radiographic dye allergy status: Secondary | ICD-10-CM

## 2015-11-12 DIAGNOSIS — J189 Pneumonia, unspecified organism: Secondary | ICD-10-CM | POA: Diagnosis not present

## 2015-11-12 DIAGNOSIS — Z888 Allergy status to other drugs, medicaments and biological substances status: Secondary | ICD-10-CM

## 2015-11-12 DIAGNOSIS — E876 Hypokalemia: Secondary | ICD-10-CM | POA: Diagnosis present

## 2015-11-12 DIAGNOSIS — Z6841 Body Mass Index (BMI) 40.0 and over, adult: Secondary | ICD-10-CM

## 2015-11-12 DIAGNOSIS — G894 Chronic pain syndrome: Secondary | ICD-10-CM | POA: Diagnosis present

## 2015-11-12 DIAGNOSIS — L03116 Cellulitis of left lower limb: Secondary | ICD-10-CM | POA: Diagnosis present

## 2015-11-12 DIAGNOSIS — Y95 Nosocomial condition: Secondary | ICD-10-CM | POA: Diagnosis present

## 2015-11-12 DIAGNOSIS — Z79891 Long term (current) use of opiate analgesic: Secondary | ICD-10-CM

## 2015-11-12 DIAGNOSIS — R0902 Hypoxemia: Secondary | ICD-10-CM

## 2015-11-12 DIAGNOSIS — M79662 Pain in left lower leg: Secondary | ICD-10-CM

## 2015-11-12 DIAGNOSIS — E119 Type 2 diabetes mellitus without complications: Secondary | ICD-10-CM | POA: Diagnosis present

## 2015-11-12 DIAGNOSIS — R197 Diarrhea, unspecified: Secondary | ICD-10-CM | POA: Diagnosis present

## 2015-11-12 DIAGNOSIS — K219 Gastro-esophageal reflux disease without esophagitis: Secondary | ICD-10-CM | POA: Diagnosis present

## 2015-11-12 DIAGNOSIS — R0602 Shortness of breath: Secondary | ICD-10-CM | POA: Diagnosis not present

## 2015-11-12 DIAGNOSIS — F419 Anxiety disorder, unspecified: Secondary | ICD-10-CM | POA: Diagnosis present

## 2015-11-12 DIAGNOSIS — R0682 Tachypnea, not elsewhere classified: Secondary | ICD-10-CM | POA: Diagnosis not present

## 2015-11-12 DIAGNOSIS — G8929 Other chronic pain: Secondary | ICD-10-CM | POA: Diagnosis present

## 2015-11-12 DIAGNOSIS — Z79899 Other long term (current) drug therapy: Secondary | ICD-10-CM

## 2015-11-12 DIAGNOSIS — I4510 Unspecified right bundle-branch block: Secondary | ICD-10-CM | POA: Diagnosis present

## 2015-11-12 DIAGNOSIS — M069 Rheumatoid arthritis, unspecified: Secondary | ICD-10-CM | POA: Diagnosis present

## 2015-11-12 DIAGNOSIS — R339 Retention of urine, unspecified: Secondary | ICD-10-CM | POA: Diagnosis present

## 2015-11-12 DIAGNOSIS — N179 Acute kidney failure, unspecified: Secondary | ICD-10-CM | POA: Diagnosis present

## 2015-11-12 DIAGNOSIS — F329 Major depressive disorder, single episode, unspecified: Secondary | ICD-10-CM | POA: Diagnosis present

## 2015-11-12 DIAGNOSIS — Z96612 Presence of left artificial shoulder joint: Secondary | ICD-10-CM | POA: Diagnosis present

## 2015-11-12 DIAGNOSIS — F32A Depression, unspecified: Secondary | ICD-10-CM | POA: Diagnosis present

## 2015-11-12 DIAGNOSIS — J9601 Acute respiratory failure with hypoxia: Secondary | ICD-10-CM | POA: Diagnosis not present

## 2015-11-12 DIAGNOSIS — M7989 Other specified soft tissue disorders: Secondary | ICD-10-CM

## 2015-11-12 DIAGNOSIS — G2581 Restless legs syndrome: Secondary | ICD-10-CM | POA: Diagnosis present

## 2015-11-12 DIAGNOSIS — E860 Dehydration: Secondary | ICD-10-CM | POA: Diagnosis present

## 2015-11-12 DIAGNOSIS — G4733 Obstructive sleep apnea (adult) (pediatric): Secondary | ICD-10-CM | POA: Diagnosis present

## 2015-11-12 DIAGNOSIS — R0600 Dyspnea, unspecified: Secondary | ICD-10-CM | POA: Insufficient documentation

## 2015-11-12 DIAGNOSIS — R400 Somnolence: Secondary | ICD-10-CM | POA: Insufficient documentation

## 2015-11-12 DIAGNOSIS — J9621 Acute and chronic respiratory failure with hypoxia: Secondary | ICD-10-CM | POA: Diagnosis present

## 2015-11-12 DIAGNOSIS — I10 Essential (primary) hypertension: Secondary | ICD-10-CM | POA: Diagnosis present

## 2015-11-12 DIAGNOSIS — G9340 Encephalopathy, unspecified: Secondary | ICD-10-CM | POA: Diagnosis present

## 2015-11-12 DIAGNOSIS — M797 Fibromyalgia: Secondary | ICD-10-CM | POA: Diagnosis present

## 2015-11-12 DIAGNOSIS — M549 Dorsalgia, unspecified: Secondary | ICD-10-CM | POA: Diagnosis present

## 2015-11-12 DIAGNOSIS — G4700 Insomnia, unspecified: Secondary | ICD-10-CM | POA: Diagnosis present

## 2015-11-12 DIAGNOSIS — J9600 Acute respiratory failure, unspecified whether with hypoxia or hypercapnia: Secondary | ICD-10-CM

## 2015-11-12 LAB — BASIC METABOLIC PANEL
ANION GAP: 9 (ref 5–15)
BUN: 12 mg/dL (ref 6–20)
CHLORIDE: 107 mmol/L (ref 101–111)
CO2: 29 mmol/L (ref 22–32)
Calcium: 8.8 mg/dL — ABNORMAL LOW (ref 8.9–10.3)
Creatinine, Ser: 0.72 mg/dL (ref 0.44–1.00)
GFR calc Af Amer: >60 mL/min (ref >60)
GLUCOSE: 143 mg/dL — AB (ref 65–99)
POTASSIUM: 3.1 mmol/L — AB (ref 3.5–5.1)
Sodium: 145 mmol/L (ref 135–145)

## 2015-11-12 LAB — CBC WITH DIFFERENTIAL/PLATELET
Basophils Absolute: 0.1 10*3/uL (ref 0.0–0.1)
Basophils Relative: 1 %
Eosinophils Absolute: 0.4 10*3/uL (ref 0.0–0.7)
Eosinophils Relative: 4 %
HEMATOCRIT: 33.3 % — AB (ref 36.0–46.0)
Hemoglobin: 10.2 g/dL — ABNORMAL LOW (ref 12.0–15.0)
LYMPHS ABS: 1.5 10*3/uL (ref 0.7–4.0)
LYMPHS PCT: 14 %
MCH: 24.6 pg — ABNORMAL LOW (ref 26.0–34.0)
MCHC: 30.6 g/dL (ref 30.0–36.0)
MCV: 80.4 fL (ref 78.0–100.0)
MONO ABS: 1.3 10*3/uL — AB (ref 0.1–1.0)
MONOS PCT: 12 %
NEUTROS ABS: 7.4 10*3/uL (ref 1.7–7.7)
Neutrophils Relative %: 69 %
Platelets: 471 10*3/uL — ABNORMAL HIGH (ref 150–400)
RBC: 4.14 MIL/uL (ref 3.87–5.11)
RDW: 16.8 % — AB (ref 11.5–15.5)
WBC: 10.7 10*3/uL — ABNORMAL HIGH (ref 4.0–10.5)

## 2015-11-12 MED ORDER — ALBUTEROL SULFATE (2.5 MG/3ML) 0.083% IN NEBU
5.0000 mg | INHALATION_SOLUTION | Freq: Once | RESPIRATORY_TRACT | Status: AC
  Administered 2015-11-12: 5 mg via RESPIRATORY_TRACT
  Filled 2015-11-12: qty 6

## 2015-11-12 NOTE — ED Provider Notes (Signed)
CSN: [id number]     Arrival date & time 11/12/15  2217 History  By signing my name below, I, Sonum Patel, attest that this documentation has been prepared under the direction and in the presence of Sharlett Iles, MD. Electronically Signed: Sonum Patel, Education administrator. 11/12/2015. 11:37 PM.    Chief Complaint  Patient presents with  . Shortness of Breath   The history is provided by the patient. No language interpreter was used.     HPI Comments: Jasmine Huff is a 60 y.o. female w/ PMH including OSA, chronic pain, HTN, GERD, morbid obesity who presents to the Emergency Department complaining of 1 month of SOB that worsened yesterday. She reports the SOB is worse with exertion and lying in a supine position. She has a pulse oximeter at home which typically stays around 92-93% at baseline and drops to the 80's after walking. She reports a history of leg swelling once before but denies having this at baseline. She reports currently being diagnosed with cellulitis for which she is still taking medications. She reports 1 episode of vomiting yesterday. She denies history of COPD, asthma, prior DVT/PE, or heart failure. She denies CP, fever, cough. She has a history of sleep apnea but does not wear a CPAP at night. She had left shoulder surgery in March 2017. She is a nonsmoker.   Of note, pt was hospitalized on 11/08/15 for AMS, thought to be related to polypharmacy. She was discharged on abx for cellulitis.   Past Medical History  Diagnosis Date  . Allergy   . Chronic pain syndrome   . Knee pain   . Urination frequency   . Anemia   . Sleep apnea     CPAP  . Migraine   . Fibromyalgia   . H/O hiatal hernia   . Anxiety     takes Xanax daily as needed  . Panic attacks   . Hypertension     takes Losartan daily  . Depression     takes Latuda,Wellbutrin,Lexapro daily  . Insomnia     takes Ambien nightly  . Shortness of breath dyspnea     with exertion  . History of migraine     last one a wk  ago;takes Excedrine as needed  . Weakness     left arm  . Restless leg     takes Requip nightly  . Arthritis     RA  . Joint pain   . Chronic back pain     bulding disc and arthritis.  Marland Kitchen GERD (gastroesophageal reflux disease) 01/21/2011    takes omeprazole daily  . Urinary urgency   . Urinary frequency   . Nocturia    Past Surgical History  Procedure Laterality Date  . Abdominal hysterectomy    . Right ovary removal    . Left hemicolectomy with splenic flexure mobilization and end colostomy  04/04/2012  . Laparotomy  04/04/2012    Procedure: EXPLORATORY LAPAROTOMY;  Surgeon: Leighton Ruff, MD;  Location: [facility];  Service: General;  Laterality: N/A;  left colectomy  . Colostomy  04/04/2012    Procedure: COLOSTOMY;  Surgeon: Leighton Ruff, MD;  Location: [facility];  Service: General;  Laterality: N/A;  . Colonoscopy N/A 11/12/2012    Procedure: COLONOSCOPY;  Surgeon: Leighton Ruff, MD;  Location: [facility];  Service: Endoscopy;  Laterality: N/A;  . Colon surgery  Oct 2013    perf bowel w/ colostomy  . Cholecystectomy  10 yrs ago  . Colostomy reversal N/A 02/17/2013  Procedure:  LAPAROSCOPIC COLOSTOMY REVERSAL; SMALL BOWEL RESECTION; LYSIS OF ADHESIONS ;  Surgeon: Leighton Ruff, MD;  Location: [facility];  Service: General;  Laterality: N/A;  . Colonoscopy    . Refractive surgery    . Total shoulder arthroplasty Left 09/27/2015    Procedure: TOTAL SHOULDER ARTHROPLASTY;  Surgeon: Tania Ade, MD;  Location: [place];  Service: Orthopedics;  Laterality: Left;  Left shoulder arthroplasty   Family History  Problem Relation Age of Onset  . Depression Mother   . Depression Father   . Alcohol abuse Brother   . Depression Brother   . Depression Maternal Grandfather   . Depression Other    Social History  Substance Use Topics  . Smoking status: Never Smoker   . Smokeless tobacco: Never Used  . Alcohol Use: No   OB History    No data available     Review of Systems  10 Systems  reviewed and all are negative for acute change except as noted in the HPI.   Allergies  Asenapine; Saphris; Seroquel; Iodinated diagnostic agents; Pregabalin; and Buprenorphine  Home Medications   Prior to Admission medications   Medication Sig Start Date End Date Taking? Authorizing Provider  acetaminophen (TYLENOL) 500 MG tablet Take 1,000 mg by mouth every 6 (six) hours as needed for moderate pain.    Yes Historical Provider, MD  ALPRAZolam Duanne Moron) 0.5 MG tablet Take 0.5 mg by mouth daily as needed for anxiety.   Yes Historical Provider, MD  buPROPion (WELLBUTRIN XL) 150 MG 24 hr tablet Take 450 mg by mouth every morning.   Yes Historical Provider, MD  cefUROXime (CEFTIN) 500 MG tablet Take 1 tablet (500 mg total) by mouth 2 (two) times daily with a meal. 11/09/15  Yes Maryann Mikhail, DO  cetirizine (ZYRTEC) 10 MG tablet Take 10 mg by mouth every evening.    Yes Historical Provider, MD  Cholecalciferol (VITAMIN D3) 5000 units TABS Take 5,000 Units by mouth daily.   Yes Historical Provider, MD  escitalopram (LEXAPRO) 20 MG tablet Take 40 mg by mouth every morning.  10/12/12  Yes Historical Provider, MD  hydroxychloroquine (PLAQUENIL) 200 MG tablet Take 100-200 mg by mouth 2 (two) times daily. 200mg  in the morning, 100mg  in the evening 08/04/11  Yes Historical Provider, MD  hydrOXYzine (ATARAX/VISTARIL) 25 MG tablet Take 25 mg by mouth every 8 (eight) hours as needed for nausea.    Yes Historical Provider, MD  losartan (COZAAR) 100 MG tablet Take 100 mg by mouth daily.   Yes Historical Provider, MD  Lurasidone HCl (LATUDA) 120 MG TABS Take 120 mg by mouth daily.   Yes Historical Provider, MD  morphine (MS CONTIN) 30 MG 12 hr tablet Take 30 mg by mouth 2 (two) times daily.   Yes Historical Provider, MD  Multiple Vitamin (MULTIVITAMIN WITH MINERALS) TABS tablet Take 1 tablet by mouth daily.   Yes Historical Provider, MD  naproxen sodium (ANAPROX) 220 MG tablet Take 220 mg by mouth every 12 (twelve)  hours as needed (for pain).   Yes Historical Provider, MD  nystatin cream (MYCOSTATIN) Apply 1 application topically 2 (two) times daily. Uses for yeast infection   Yes Historical Provider, MD  omeprazole (PRILOSEC) 40 MG capsule Take 40 mg by mouth every evening.  11/16/12  Yes Historical Provider, MD  Oxycodone HCl 10 MG TABS Take one 10 mg tablet every 4-6 hrs as needed for pain Patient taking differently: Take 10 mg by mouth every 8 (eight) hours.  09/28/15  Yes Danielle Laliberte, PA-C  rOPINIRole (REQUIP) 3 MG tablet Take 3 mg by mouth at bedtime.   Yes Historical Provider, MD  zolpidem (AMBIEN) 10 MG tablet Take 10 mg by mouth at bedtime as needed for sleep.   Yes Historical Provider, MD   BP 156/89 mmHg  Pulse 94  Temp(Src) 99.1 F (37.3 C) (Oral)  Resp 20  SpO2 100% Physical Exam  Constitutional: She is oriented to person, place, and time. She appears well-developed and well-nourished.  Dyspneic, sitting up in bed, awake and alert  HENT:  Head: Normocephalic and atraumatic.  Moist mucous membranes  Eyes: Conjunctivae are normal. Pupils are equal, round, and reactive to light.  Neck: Neck supple.  Cardiovascular: Regular rhythm and normal heart sounds.  Tachycardia present.   No murmur heard. Pulmonary/Chest: No respiratory distress.  Increased WOB w/ prolonged expiratory phase, diminished BS b/l w/ b/l faint crackles  Abdominal: Soft. Bowel sounds are normal. She exhibits no distension. There is no tenderness.  Musculoskeletal: She exhibits edema. She exhibits no tenderness.  BLE edema L>R  Neurological: She is alert and oriented to person, place, and time.  Fluent speech  Skin: Skin is warm and dry.  Very faint erythema of L lower leg  Psychiatric: She has a normal mood and affect. Judgment normal.  Nursing note and vitals reviewed.   ED Course  .Critical Care Performed by: Sharlett Iles Authorized by: Sharlett Iles Total critical care time: 45  minutes Critical care time was exclusive of separately billable procedures and treating other patients. Critical care was necessary to treat or prevent imminent or life-threatening deterioration of the following conditions: respiratory failure. Critical care was time spent personally by me on the following activities: development of treatment plan with patient or surrogate, evaluation of patient's response to treatment, examination of patient, obtaining history from patient or surrogate, ordering and performing treatments and interventions, ordering and review of laboratory studies, ordering and review of radiographic studies, pulse oximetry, re-evaluation of patient's condition and review of old charts.   (including critical care time)  DIAGNOSTIC STUDIES: Oxygen Saturation is 97% on [place] 4L/min O2, adequate by my interpretation.    COORDINATION OF CARE: 11:46 PM Will order lab work and CXR. Discussed treatment plan with pt at bedside and pt agreed to plan.   Labs Review Labs Reviewed  BASIC METABOLIC PANEL - Abnormal; Notable for the following:    Potassium 3.1 (*)    Glucose, Bld 143 (*)    Calcium 8.8 (*)    All other components within normal limits  BRAIN NATRIURETIC PEPTIDE - Abnormal; Notable for the following:    B Natriuretic Peptide 116.7 (*)    All other components within normal limits  CBC WITH DIFFERENTIAL/PLATELET - Abnormal; Notable for the following:    WBC 10.7 (*)    Hemoglobin 10.2 (*)    HCT 33.3 (*)    MCH 24.6 (*)    RDW 16.8 (*)    Platelets 471 (*)    Monocytes Absolute 1.3 (*)    All other components within normal limits  D-DIMER, QUANTITATIVE (NOT AT [facility]) - Abnormal; Notable for the following:    D-Dimer, Quant 0.88 (*)    All other components within normal limits  URINALYSIS, ROUTINE W REFLEX MICROSCOPIC (NOT AT [facility]) - Abnormal; Notable for the following:    Color, Urine AMBER (*)    APPearance CLOUDY (*)    Specific Gravity, Urine 1.040 (*)     Protein,  ur 100 (*)    All other components within normal limits  URINE MICROSCOPIC-ADD ON - Abnormal; Notable for the following:    Squamous Epithelial / LPF 0-5 (*)    Bacteria, UA RARE (*)    Casts HYALINE CASTS (*)    All other components within normal limits  CBC WITH DIFFERENTIAL/PLATELET - Abnormal; Notable for the following:    Hemoglobin 10.2 (*)    HCT 34.5 (*)    MCH 24.0 (*)    MCHC 29.6 (*)    RDW 17.2 (*)    Platelets 471 (*)    Neutro Abs 8.2 (*)    All other components within normal limits  BASIC METABOLIC PANEL - Abnormal; Notable for the following:    Potassium 3.3 (*)    Glucose, Bld 181 (*)    Calcium 8.4 (*)    All other components within normal limits  GLUCOSE, CAPILLARY - Abnormal; Notable for the following:    Glucose-Capillary 215 (*)    All other components within normal limits  MRSA PCR SCREENING  APTT  PROTIME-INR    Imaging Review Dg Chest 2 View  11/12/2015  CLINICAL DATA:  60 year old female with shortness of breath and wheezing. EXAM: CHEST  2 VIEW COMPARISON:  Chest radiograph dated 11/08/2015 FINDINGS: There is rotated positioning of the patient. No definite consolidation noted. No pleural effusion or pneumothorax. There is cardiomegaly. 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 IMPRESSION: Limited study due to patient positioning and soft tissue attenuation. No definite acute cardiopulmonary process. Electronically Signed   By: Anner Crete M.D.   On: 11/12/2015 23:38   I have personally reviewed and evaluated these lab results as part of my medical decision-making.   EKG Interpretation   Date/Time:  Monday Nov 12 2015 22:42:30 EDT Ventricular Rate:  95 PR Interval:  172 QRS Duration: 168 QT Interval:  396 QTC Calculation: 498 R Axis:   -125 Text Interpretation:  Sinus rhythm Right bundle branch block since  previous tracing, tachycardia has resolved, otherwise no significant  change Confirmed by  Allecia Bells MD, Deolinda Frid [id number]) on 11/12/2015 11:12:09 PM      MDM   Final diagnoses:  Hypoxia  Shortness of breath  Acute respiratory failure ([place])   Patient with multiple medical problems presents with 1 month of worsening shortness of breath particularly with exertion. On exam, she was just neck with prolonged expiratory phase, increased work of breathing, but no respiratory distress. She had just received a DuoNeb. Diminished breath sounds bilaterally although difficult to auscultate lung sounds due to body habitus. Possible faint crackles bilaterally. EKG without significant change from previous. Chest x-ray shows cardiomegaly, limited study due to patient positioning. Obtained above lab work including d-dimer given the patient's recent hospitalization. Labs showed negative troponin, normal WBC count, stable anemia, mild hypokalemia at 3.1 for which I gave the patient oral potassium, BNP 117, d-dimer elevated at 0.88. The patient has a reported allergy of hives to IV contrast. I discussed options with her including prolonged 12-hour steroid protocol, or alternatively 1 hour protocol with Solu-Medrol and Benadryl. The patient voiced understanding of risks of one hour protocol including possible allergic reaction but elected to proceed. Prior to receiving contrast, the patient was laid flat to obtain CTA and she immediately became more dyspneic. When she was brought back to ED room, she was mildly cyanotic with O2 saturation 77%. Immediately placed on nonrebreather and given her work of breathing, I contacted respiratory therapy to place  the patient on BiPAP as she does have an underlying history of OSA. One attempt was made at ABG but patient refused any further attempts. I discussed risks and benefits of him. Treating for clot with heparin and patient agreed to proceed. Ordered a heparin drip per PE protocol and discussed admission with hospitalist, Dr. Tamala Julian. Patient admitted to stepdown for further  workup.  I personally performed the services described in this documentation, which was scribed in my presence. The recorded information has been reviewed and is accurate.   Sharlett Iles, MD 11/13/15 [phone]

## 2015-11-12 NOTE — ED Notes (Signed)
Per EMS pt is c/o shortness of breath for the past month that is progressively getting worse  Pt states she gets very short of breath on exertion  Pt denies pain, vomiting, dizziness, etc at this time  EMS reports when fire dept got to the pt her O2 sat on room air was in the 70s so they applied NRBM at 15 liters and sats rose to 98%  In route to hospital pt developed exp wheezing and was given albuterol 5mg  neb treatment   EMS reports they took the oxygen off and pts sats dropped from 98% to 95% withing seconds

## 2015-11-13 ENCOUNTER — Emergency Department (HOSPITAL_COMMUNITY): Payer: Medicare Other

## 2015-11-13 ENCOUNTER — Observation Stay (HOSPITAL_BASED_OUTPATIENT_CLINIC_OR_DEPARTMENT_OTHER): Payer: Medicare Other

## 2015-11-13 ENCOUNTER — Observation Stay (HOSPITAL_COMMUNITY): Payer: Medicare Other

## 2015-11-13 ENCOUNTER — Ambulatory Visit: Payer: Self-pay | Admitting: Physical Therapy

## 2015-11-13 ENCOUNTER — Encounter (HOSPITAL_COMMUNITY): Payer: Self-pay | Admitting: Radiology

## 2015-11-13 DIAGNOSIS — R0902 Hypoxemia: Secondary | ICD-10-CM | POA: Insufficient documentation

## 2015-11-13 DIAGNOSIS — F329 Major depressive disorder, single episode, unspecified: Secondary | ICD-10-CM

## 2015-11-13 DIAGNOSIS — M79662 Pain in left lower leg: Secondary | ICD-10-CM | POA: Diagnosis not present

## 2015-11-13 DIAGNOSIS — M797 Fibromyalgia: Secondary | ICD-10-CM | POA: Diagnosis present

## 2015-11-13 DIAGNOSIS — G894 Chronic pain syndrome: Secondary | ICD-10-CM | POA: Diagnosis present

## 2015-11-13 DIAGNOSIS — Z91041 Radiographic dye allergy status: Secondary | ICD-10-CM | POA: Diagnosis not present

## 2015-11-13 DIAGNOSIS — Z96611 Presence of right artificial shoulder joint: Secondary | ICD-10-CM | POA: Diagnosis present

## 2015-11-13 DIAGNOSIS — L03116 Cellulitis of left lower limb: Secondary | ICD-10-CM | POA: Diagnosis present

## 2015-11-13 DIAGNOSIS — G2581 Restless legs syndrome: Secondary | ICD-10-CM | POA: Diagnosis present

## 2015-11-13 DIAGNOSIS — Z79891 Long term (current) use of opiate analgesic: Secondary | ICD-10-CM | POA: Diagnosis not present

## 2015-11-13 DIAGNOSIS — R918 Other nonspecific abnormal finding of lung field: Secondary | ICD-10-CM | POA: Diagnosis not present

## 2015-11-13 DIAGNOSIS — K219 Gastro-esophageal reflux disease without esophagitis: Secondary | ICD-10-CM

## 2015-11-13 DIAGNOSIS — G4700 Insomnia, unspecified: Secondary | ICD-10-CM

## 2015-11-13 DIAGNOSIS — N179 Acute kidney failure, unspecified: Secondary | ICD-10-CM | POA: Diagnosis present

## 2015-11-13 DIAGNOSIS — J9621 Acute and chronic respiratory failure with hypoxia: Secondary | ICD-10-CM | POA: Diagnosis present

## 2015-11-13 DIAGNOSIS — R0602 Shortness of breath: Secondary | ICD-10-CM | POA: Diagnosis not present

## 2015-11-13 DIAGNOSIS — F419 Anxiety disorder, unspecified: Secondary | ICD-10-CM | POA: Diagnosis present

## 2015-11-13 DIAGNOSIS — J189 Pneumonia, unspecified organism: Secondary | ICD-10-CM | POA: Diagnosis not present

## 2015-11-13 DIAGNOSIS — J9601 Acute respiratory failure with hypoxia: Secondary | ICD-10-CM | POA: Diagnosis present

## 2015-11-13 DIAGNOSIS — I4510 Unspecified right bundle-branch block: Secondary | ICD-10-CM | POA: Diagnosis present

## 2015-11-13 DIAGNOSIS — Z9119 Patient's noncompliance with other medical treatment and regimen: Secondary | ICD-10-CM | POA: Diagnosis not present

## 2015-11-13 DIAGNOSIS — M069 Rheumatoid arthritis, unspecified: Secondary | ICD-10-CM | POA: Diagnosis present

## 2015-11-13 DIAGNOSIS — E876 Hypokalemia: Secondary | ICD-10-CM | POA: Diagnosis present

## 2015-11-13 DIAGNOSIS — Z79899 Other long term (current) drug therapy: Secondary | ICD-10-CM | POA: Diagnosis not present

## 2015-11-13 DIAGNOSIS — R197 Diarrhea, unspecified: Secondary | ICD-10-CM | POA: Diagnosis present

## 2015-11-13 DIAGNOSIS — M7989 Other specified soft tissue disorders: Secondary | ICD-10-CM | POA: Diagnosis not present

## 2015-11-13 DIAGNOSIS — G9340 Encephalopathy, unspecified: Secondary | ICD-10-CM | POA: Diagnosis present

## 2015-11-13 DIAGNOSIS — I10 Essential (primary) hypertension: Secondary | ICD-10-CM | POA: Diagnosis present

## 2015-11-13 DIAGNOSIS — E118 Type 2 diabetes mellitus with unspecified complications: Secondary | ICD-10-CM

## 2015-11-13 DIAGNOSIS — J9600 Acute respiratory failure, unspecified whether with hypoxia or hypercapnia: Secondary | ICD-10-CM | POA: Diagnosis not present

## 2015-11-13 DIAGNOSIS — R400 Somnolence: Secondary | ICD-10-CM | POA: Diagnosis not present

## 2015-11-13 DIAGNOSIS — Z888 Allergy status to other drugs, medicaments and biological substances status: Secondary | ICD-10-CM | POA: Diagnosis not present

## 2015-11-13 DIAGNOSIS — G8929 Other chronic pain: Secondary | ICD-10-CM | POA: Diagnosis not present

## 2015-11-13 DIAGNOSIS — Y95 Nosocomial condition: Secondary | ICD-10-CM | POA: Diagnosis present

## 2015-11-13 DIAGNOSIS — E860 Dehydration: Secondary | ICD-10-CM | POA: Diagnosis present

## 2015-11-13 DIAGNOSIS — R339 Retention of urine, unspecified: Secondary | ICD-10-CM | POA: Diagnosis present

## 2015-11-13 DIAGNOSIS — E119 Type 2 diabetes mellitus without complications: Secondary | ICD-10-CM | POA: Diagnosis present

## 2015-11-13 DIAGNOSIS — Z6841 Body Mass Index (BMI) 40.0 and over, adult: Secondary | ICD-10-CM | POA: Diagnosis not present

## 2015-11-13 DIAGNOSIS — G4733 Obstructive sleep apnea (adult) (pediatric): Secondary | ICD-10-CM | POA: Diagnosis not present

## 2015-11-13 DIAGNOSIS — M549 Dorsalgia, unspecified: Secondary | ICD-10-CM | POA: Diagnosis present

## 2015-11-13 DIAGNOSIS — Z96612 Presence of left artificial shoulder joint: Secondary | ICD-10-CM | POA: Diagnosis present

## 2015-11-13 DIAGNOSIS — R0600 Dyspnea, unspecified: Secondary | ICD-10-CM | POA: Diagnosis not present

## 2015-11-13 LAB — BASIC METABOLIC PANEL
Anion gap: 12 (ref 5–15)
BUN: 12 mg/dL (ref 6–20)
CO2: 26 mmol/L (ref 22–32)
CREATININE: 0.75 mg/dL (ref 0.44–1.00)
Calcium: 8.4 mg/dL — ABNORMAL LOW (ref 8.9–10.3)
Chloride: 104 mmol/L (ref 101–111)
Glucose, Bld: 181 mg/dL — ABNORMAL HIGH (ref 65–99)
POTASSIUM: 3.3 mmol/L — AB (ref 3.5–5.1)
SODIUM: 142 mmol/L (ref 135–145)

## 2015-11-13 LAB — C-REACTIVE PROTEIN: CRP: 2.2 mg/dL — AB (ref <1.0)

## 2015-11-13 LAB — GLUCOSE, CAPILLARY
Glucose-Capillary: 215 mg/dL — ABNORMAL HIGH (ref 65–99)
Glucose-Capillary: 192 mg/dL — ABNORMAL HIGH (ref 65–99)
Glucose-Capillary: 168 mg/dL — ABNORMAL HIGH (ref 65–99)

## 2015-11-13 LAB — CBC WITH DIFFERENTIAL/PLATELET
BASOS PCT: 0 %
Basophils Absolute: 0 10*3/uL (ref 0.0–0.1)
EOS ABS: 0.3 10*3/uL (ref 0.0–0.7)
EOS PCT: 3 %
HCT: 34.5 % — ABNORMAL LOW (ref 36.0–46.0)
Hemoglobin: 10.2 g/dL — ABNORMAL LOW (ref 12.0–15.0)
LYMPHS ABS: 0.9 10*3/uL (ref 0.7–4.0)
Lymphocytes Relative: 9 %
MCH: 24 pg — AB (ref 26.0–34.0)
MCHC: 29.6 g/dL — ABNORMAL LOW (ref 30.0–36.0)
MCV: 81.2 fL (ref 78.0–100.0)
Monocytes Absolute: 0.7 10*3/uL (ref 0.1–1.0)
Monocytes Relative: 7 %
NEUTROS PCT: 81 %
Neutro Abs: 8.2 10*3/uL — ABNORMAL HIGH (ref 1.7–7.7)
PLATELETS: 471 10*3/uL — AB (ref 150–400)
RBC: 4.25 MIL/uL (ref 3.87–5.11)
RDW: 17.2 % — ABNORMAL HIGH (ref 11.5–15.5)
WBC: 10.2 10*3/uL (ref 4.0–10.5)

## 2015-11-13 LAB — APTT: APTT: 29 s (ref 24–37)

## 2015-11-13 LAB — CULTURE, BLOOD (ROUTINE X 2)
Culture: NO GROWTH
Culture: NO GROWTH

## 2015-11-13 LAB — URINALYSIS, ROUTINE W REFLEX MICROSCOPIC
Bilirubin Urine: NEGATIVE
Glucose, UA: NEGATIVE mg/dL
Hgb urine dipstick: NEGATIVE
Ketones, ur: NEGATIVE mg/dL
Leukocytes, UA: NEGATIVE
Nitrite: NEGATIVE
Protein, ur: 100 mg/dL — AB
Specific Gravity, Urine: 1.04 — ABNORMAL HIGH (ref 1.005–1.030)
pH: 6 (ref 5.0–8.0)

## 2015-11-13 LAB — URINE MICROSCOPIC-ADD ON: RBC / HPF: NONE SEEN RBC/hpf (ref 0–5)

## 2015-11-13 LAB — PROTIME-INR
INR: 1.19 (ref 0.00–1.49)
PROTHROMBIN TIME: 14.8 s (ref 11.6–15.2)

## 2015-11-13 LAB — MRSA PCR SCREENING: MRSA BY PCR: NEGATIVE

## 2015-11-13 LAB — SEDIMENTATION RATE: SED RATE: 20 mm/h (ref 0–22)

## 2015-11-13 LAB — ECHOCARDIOGRAM COMPLETE
Height: 63 in
WEIGHTICAEL: 5767.23 [oz_av]

## 2015-11-13 LAB — D-DIMER, QUANTITATIVE: D-Dimer, Quant: 0.88 ug/mL-FEU — ABNORMAL HIGH (ref 0.00–0.50)

## 2015-11-13 LAB — BRAIN NATRIURETIC PEPTIDE: B NATRIURETIC PEPTIDE 5: 116.7 pg/mL — AB (ref 0.0–100.0)

## 2015-11-13 MED ORDER — IPRATROPIUM BROMIDE 0.02 % IN SOLN: 0.5000 mg | RESPIRATORY_TRACT | Status: DC | PRN

## 2015-11-13 MED ORDER — ALBUTEROL SULFATE (2.5 MG/3ML) 0.083% IN NEBU: 2.5000 mg | INHALATION_SOLUTION | RESPIRATORY_TRACT | Status: DC | PRN

## 2015-11-13 MED ORDER — ZOLPIDEM TARTRATE 5 MG PO TABS: 5.0000 mg | ORAL_TABLET | Freq: Every evening | ORAL | Status: DC | PRN

## 2015-11-13 MED ORDER — NYSTATIN 100000 UNIT/GM EX CREA
1.0000 "application " | TOPICAL_CREAM | Freq: Two times a day (BID) | CUTANEOUS | Status: DC
  Administered 2015-11-13 – 2015-11-16 (×7): 1 via TOPICAL
  Filled 2015-11-13 (×2): qty 15

## 2015-11-13 MED ORDER — SODIUM CHLORIDE 0.45 % IV SOLN
INTRAVENOUS | Status: DC
  Administered 2015-11-13 – 2015-11-14 (×2): via INTRAVENOUS

## 2015-11-13 MED ORDER — HYDROXYCHLOROQUINE SULFATE 200 MG PO TABS
100.0000 mg | ORAL_TABLET | Freq: Every day | ORAL | Status: DC
  Administered 2015-11-13 – 2015-11-15 (×3): 100 mg via ORAL
  Filled 2015-11-13 (×5): qty 0.5

## 2015-11-13 MED ORDER — HEPARIN SODIUM (PORCINE) 5000 UNIT/ML IJ SOLN
5000.0000 [IU] | Freq: Three times a day (TID) | INTRAMUSCULAR | Status: DC
  Administered 2015-11-13 – 2015-11-16 (×9): 5000 [IU] via SUBCUTANEOUS
  Filled 2015-11-13 (×13): qty 1

## 2015-11-13 MED ORDER — HEPARIN (PORCINE) IN NACL 100-0.45 UNIT/ML-% IJ SOLN
1550.0000 [IU]/h | INTRAMUSCULAR | Status: DC
  Administered 2015-11-13: 1550 [IU]/h via INTRAVENOUS
  Filled 2015-11-13 (×2): qty 250

## 2015-11-13 MED ORDER — ONDANSETRON HCL 4 MG PO TABS: 4.0000 mg | ORAL_TABLET | Freq: Four times a day (QID) | ORAL | Status: DC | PRN

## 2015-11-13 MED ORDER — DEXTROSE 5 % IV SOLN
1.0000 g | Freq: Three times a day (TID) | INTRAVENOUS | Status: DC
  Administered 2015-11-13 – 2015-11-16 (×8): 1 g via INTRAVENOUS
  Filled 2015-11-13 (×9): qty 1

## 2015-11-13 MED ORDER — ROPINIROLE HCL 1 MG PO TABS
3.0000 mg | ORAL_TABLET | Freq: Every day | ORAL | Status: DC
  Administered 2015-11-13 – 2015-11-15 (×3): 3 mg via ORAL
  Filled 2015-11-13 (×5): qty 3

## 2015-11-13 MED ORDER — LURASIDONE HCL 40 MG PO TABS
120.0000 mg | ORAL_TABLET | Freq: Every day | ORAL | Status: DC
  Administered 2015-11-13: 120 mg via ORAL
  Filled 2015-11-13 (×2): qty 3

## 2015-11-13 MED ORDER — ALPRAZOLAM 0.5 MG PO TABS
0.5000 mg | ORAL_TABLET | Freq: Every day | ORAL | Status: DC | PRN
  Administered 2015-11-13 – 2015-11-16 (×5): 0.5 mg via ORAL
  Filled 2015-11-13 (×5): qty 1

## 2015-11-13 MED ORDER — ZOLPIDEM TARTRATE 10 MG PO TABS: 10.0000 mg | ORAL_TABLET | Freq: Every evening | ORAL | Status: DC | PRN

## 2015-11-13 MED ORDER — INSULIN ASPART 100 UNIT/ML ~~LOC~~ SOLN
0.0000 [IU] | Freq: Three times a day (TID) | SUBCUTANEOUS | Status: DC
  Administered 2015-11-13: 2 [IU] via SUBCUTANEOUS
  Administered 2015-11-13: 3 [IU] via SUBCUTANEOUS
  Administered 2015-11-13: 2 [IU] via SUBCUTANEOUS
  Administered 2015-11-14 – 2015-11-15 (×3): 1 [IU] via SUBCUTANEOUS
  Administered 2015-11-15: 09:00:00 via SUBCUTANEOUS
  Administered 2015-11-15: 1 [IU] via SUBCUTANEOUS
  Administered 2015-11-16 (×3): 2 [IU] via SUBCUTANEOUS

## 2015-11-13 MED ORDER — ALBUTEROL SULFATE (2.5 MG/3ML) 0.083% IN NEBU
5.0000 mg | INHALATION_SOLUTION | Freq: Once | RESPIRATORY_TRACT | Status: AC
  Administered 2015-11-13: 5 mg via RESPIRATORY_TRACT
  Filled 2015-11-13: qty 6

## 2015-11-13 MED ORDER — OXYCODONE HCL 5 MG PO TABS
10.0000 mg | ORAL_TABLET | Freq: Three times a day (TID) | ORAL | Status: DC
  Administered 2015-11-13 – 2015-11-14 (×4): 10 mg via ORAL
  Filled 2015-11-13 (×4): qty 2

## 2015-11-13 MED ORDER — ADULT MULTIVITAMIN W/MINERALS CH
1.0000 | ORAL_TABLET | Freq: Every day | ORAL | Status: DC
  Administered 2015-11-13 – 2015-11-15 (×3): 1 via ORAL
  Filled 2015-11-13 (×4): qty 1

## 2015-11-13 MED ORDER — LABETALOL HCL 5 MG/ML IV SOLN
10.0000 mg | INTRAVENOUS | Status: DC | PRN
  Administered 2015-11-16: 10 mg via INTRAVENOUS
  Filled 2015-11-13 (×3): qty 4

## 2015-11-13 MED ORDER — ONDANSETRON HCL 4 MG/2ML IJ SOLN: 4.0000 mg | Freq: Four times a day (QID) | INTRAMUSCULAR | Status: DC | PRN

## 2015-11-13 MED ORDER — SODIUM CHLORIDE 0.9 % IV SOLN
250.0000 mg | Freq: Once | INTRAVENOUS | Status: AC
  Administered 2015-11-13: 250 mg via INTRAVENOUS
  Filled 2015-11-13: qty 2

## 2015-11-13 MED ORDER — BUPROPION HCL ER (XL) 300 MG PO TB24
450.0000 mg | ORAL_TABLET | Freq: Every morning | ORAL | Status: DC
  Administered 2015-11-13 – 2015-11-16 (×4): 450 mg via ORAL
  Filled 2015-11-13 (×4): qty 1

## 2015-11-13 MED ORDER — ESCITALOPRAM OXALATE 20 MG PO TABS
40.0000 mg | ORAL_TABLET | Freq: Every morning | ORAL | Status: DC
  Administered 2015-11-13 – 2015-11-16 (×4): 40 mg via ORAL
  Filled 2015-11-13 (×4): qty 2

## 2015-11-13 MED ORDER — MORPHINE SULFATE ER 30 MG PO TBCR
30.0000 mg | EXTENDED_RELEASE_TABLET | Freq: Two times a day (BID) | ORAL | Status: DC
  Administered 2015-11-13 (×2): 30 mg via ORAL
  Filled 2015-11-13 (×2): qty 1

## 2015-11-13 MED ORDER — SODIUM CHLORIDE 0.9 % IV SOLN
INTRAVENOUS | Status: DC
  Administered 2015-11-13: 08:00:00 via INTRAVENOUS

## 2015-11-13 MED ORDER — LORATADINE 10 MG PO TABS
10.0000 mg | ORAL_TABLET | Freq: Every day | ORAL | Status: DC
  Administered 2015-11-13: 10 mg via ORAL
  Filled 2015-11-13: qty 1

## 2015-11-13 MED ORDER — DEXTROSE 5 % IV SOLN
2.0000 g | Freq: Once | INTRAVENOUS | Status: AC
  Administered 2015-11-13: 2 g via INTRAVENOUS
  Filled 2015-11-13: qty 2

## 2015-11-13 MED ORDER — VANCOMYCIN HCL 10 G IV SOLR
1500.0000 mg | Freq: Two times a day (BID) | INTRAVENOUS | Status: DC
  Administered 2015-11-14: 1500 mg via INTRAVENOUS
  Filled 2015-11-13 (×2): qty 1500

## 2015-11-13 MED ORDER — CEFUROXIME AXETIL 500 MG PO TABS
500.0000 mg | ORAL_TABLET | Freq: Two times a day (BID) | ORAL | Status: DC
  Administered 2015-11-13: 500 mg via ORAL
  Filled 2015-11-13 (×4): qty 1

## 2015-11-13 MED ORDER — PANTOPRAZOLE SODIUM 40 MG PO TBEC
40.0000 mg | DELAYED_RELEASE_TABLET | Freq: Every day | ORAL | Status: DC
Start: 2015-11-13 — End: 2015-11-16
  Administered 2015-11-13 – 2015-11-16 (×4): 40 mg via ORAL
  Filled 2015-11-13 (×5): qty 1

## 2015-11-13 MED ORDER — LOSARTAN POTASSIUM 50 MG PO TABS
100.0000 mg | ORAL_TABLET | Freq: Every day | ORAL | Status: DC
  Administered 2015-11-13: 100 mg via ORAL
  Filled 2015-11-13 (×2): qty 2

## 2015-11-13 MED ORDER — VITAMIN D 1000 UNITS PO TABS
5000.0000 [IU] | ORAL_TABLET | Freq: Every day | ORAL | Status: DC
  Administered 2015-11-13 – 2015-11-16 (×4): 5000 [IU] via ORAL
  Filled 2015-11-13 (×4): qty 5

## 2015-11-13 MED ORDER — POTASSIUM CHLORIDE CRYS ER 20 MEQ PO TBCR
60.0000 meq | EXTENDED_RELEASE_TABLET | Freq: Once | ORAL | Status: AC
  Administered 2015-11-13: 60 meq via ORAL
  Filled 2015-11-13: qty 3

## 2015-11-13 MED ORDER — ACETAMINOPHEN 325 MG PO TABS
650.0000 mg | ORAL_TABLET | ORAL | Status: DC | PRN
  Administered 2015-11-13 – 2015-11-16 (×10): 650 mg via ORAL
  Filled 2015-11-13 (×10): qty 2

## 2015-11-13 MED ORDER — DIPHENHYDRAMINE HCL 50 MG/ML IJ SOLN
50.0000 mg | Freq: Once | INTRAMUSCULAR | Status: AC
  Administered 2015-11-13: 50 mg via INTRAVENOUS
  Filled 2015-11-13: qty 1

## 2015-11-13 MED ORDER — VANCOMYCIN HCL 10 G IV SOLR
2500.0000 mg | Freq: Once | INTRAVENOUS | Status: AC
  Administered 2015-11-13: 2500 mg via INTRAVENOUS
  Filled 2015-11-13: qty 2000

## 2015-11-13 MED ORDER — HYDROXYCHLOROQUINE SULFATE 200 MG PO TABS
200.0000 mg | ORAL_TABLET | Freq: Every day | ORAL | Status: DC
  Administered 2015-11-13 – 2015-11-16 (×4): 200 mg via ORAL
  Filled 2015-11-13 (×4): qty 1

## 2015-11-13 MED ORDER — HYDROXYZINE HCL 25 MG PO TABS
25.0000 mg | ORAL_TABLET | Freq: Three times a day (TID) | ORAL | Status: DC | PRN
Start: 2015-11-13 — End: 2015-11-14

## 2015-11-13 MED ORDER — HEPARIN BOLUS VIA INFUSION
5000.0000 [IU] | Freq: Once | INTRAVENOUS | Status: AC
  Administered 2015-11-13: 5000 [IU] via INTRAVENOUS
  Filled 2015-11-13: qty 5000

## 2015-11-13 MED ORDER — IOPAMIDOL (ISOVUE-370) INJECTION 76%
100.0000 mL | Freq: Once | INTRAVENOUS | Status: AC | PRN
  Administered 2015-11-13: 100 mL via INTRAVENOUS

## 2015-11-13 NOTE — Progress Notes (Addendum)
PROGRESS NOTE    Jasmine Huff  [id number] DOB: [birth year] DOA: 11/12/2015  PCP: Lilian Coma, MD  Outpatient Specialists: [facility]  Brief Narrative:  Jasmine Huff is a 60 y.o. female from home with morbid obesity, HTN, anxiety, chronic pain, OSA noncompliant with CPAP, RSL, DM type II who presents with complaints of shortness of breath. She was discharge don 5/5 after being admitted for acute encephalopathy and left leg cellulitis. She was noted to be hypoxic on room air at rest in the 80s and it was recommended by Dr Ree Kida that she go home with O2 but she declined. She states she checked her pulse ox at home while walking yesterday and it was found to be in the 70s.   Subjective: Went to see patient in room 3 times today to evaluate and then to update.  States that she is not short of breath at rest at this time. No cough or chest pain. Not feverish. Admits to many episodes of diarrhea which she attributes to antibiotics.  We have discussed the need for a CPAP at night. She states she has packed it away at home and is not sure where it is. She states the mask didn't suit her and I discussed trying to find a mask that fits her while she is here and asked her to see if her brother who lives with her can find the CPAP. She states that if she is not able to find it, what will happen. I stated that we would ask our case managers to get in touch with her insurance company in regards to it. I have asked her about her most recent sleep study. She states it was about 1 yr ago and done by Dr Nehemiah Settle.  We have discussed dietary changes to help her comply with a diabetic diet and to lose weight.   Assessment & Plan:   Principal Problem:   Acute respiratory failure with hypoxia - CT reveals multifocal infiltrates - no symptoms of infection- Dr Pennie Banter reviewed the CT with me and stated that he would consult- he has started antibiotics for HCAP and recommended a  bronchoscopy- she is declining at this time- appreciate pulm assistance - prior to CT being done, she was started on Heparin for possible PE- will d/c this  Active Problems: Anxiety - became anxious and short of breath after I saw her and told her that she has abnormalities on her Ct which Dr Pennie Banter will be discussin with her - RN gave her Xanax- Dr [place] Bells recommends keeping her in SDU for now    Left leg cellulitis - started on Cefepime by PCCM- Ceftin d/c'd - LLE Duplex neg  Diarrhea/ dehydration - IVF - cont to follow for episodes in the hospital - oral antibiotic switched to Cefipime-   Hypokalemia - due to above- replaced.     Chronic pain - cont MS Contin and Oxycodone PRN  OSA - have asked CCU secretary to obtain sleep study from Dr Jeneen Rinks Osborne's office so we can determine her settings- recevied message from office- sleep study was in 2015- settings are 4/8    Insomnia - Ambien    Depression - Wellbutrin, Lexapro, Latuda    GERD (gastroesophageal reflux disease) - Protonix    Diabetes mellitus ([place]) - SSI - A1c 7.0- discussed diabetic diet education - will need Metformin on d/c  RLS - REquip  Morbid obesity - Body mass index is 63.87 kg/(m^2).  - dietician consult for further education  DVT prophylaxis: Heparin Code Status: full cod Family Communication:  Disposition Plan: follow in SDU Consultants:   PCCM Procedures:   none Antimicrobials:  Anti-infectives    Start     Dose/Rate Route Frequency Ordered Stop   11/14/15 0000  vancomycin (VANCOCIN) 1,500 mg in sodium chloride 0.9 % 500 mL IVPB     1,500 mg 250 mL/hr over 120 Minutes Intravenous Every 12 hours 11/13/15 1006     11/13/15 2200  hydroxychloroquine (PLAQUENIL) tablet 100 mg     100 mg Oral Daily at bedtime 11/13/15 0704     11/13/15 2000  ceFEPIme (MAXIPIME) 1 g in dextrose 5 % 50 mL IVPB     1 g 100 mL/hr over 30 Minutes Intravenous Every 8 hours 11/13/15 1006     11/13/15  1130  vancomycin (VANCOCIN) 2,500 mg in sodium chloride 0.9 % 500 mL IVPB     2,500 mg 250 mL/hr over 120 Minutes Intravenous  Once 11/13/15 0954     11/13/15 1100  ceFEPIme (MAXIPIME) 2 g in dextrose 5 % 50 mL IVPB     2 g 100 mL/hr over 30 Minutes Intravenous  Once 11/13/15 0954     11/13/15 1000  hydroxychloroquine (PLAQUENIL) tablet 200 mg     200 mg Oral Daily 11/13/15 0605     11/13/15 0800  cefUROXime (CEFTIN) tablet 500 mg  Status:  Discontinued     500 mg Oral 2 times daily with meals 11/13/15 0605 11/13/15 0944       Objective: Filed Vitals:   11/13/15 0652 11/13/15 0700 11/13/15 0814 11/13/15 0825  BP: 194/93 122/107  159/105  Pulse: 101 99  103  Temp: 98.9 F (37.2 C)  98.7 F (37.1 C)   TempSrc: Axillary  Oral   Resp: 21 20  23   Height:    5\' 3"  (1.6 m)  SpO2: 100% 100%  97%    Intake/Output Summary (Last 24 hours) at 11/13/15 1112 Last data filed at 11/13/15 0900  Gross per 24 hour  Intake 468.42 ml  Output      0 ml  Net 468.42 ml   There were no vitals filed for this visit.  Examination: General exam: Appears comfortable HEENT: PERRLA, oral mucosa moist, no sclera icterus or thrush Respiratory system: Clear to auscultation. Respiratory effort normal. Cardiovascular system: S1 & S2 heard, RRR.  No murmurs  Gastrointestinal system: Abdomen soft, non-tender, nondistended. Normal bowel sound. No organomegaly Central nervous system: Alert and oriented. No focal neurological deficits. Extremities: No cyanosis, clubbing or edema Skin: No rashes or ulcers Psychiatry:  Mood & affect appropriate.     Data Reviewed: I have personally reviewed following labs and imaging studies  CBC:  Recent Labs Lab 11/08/15 0141 11/08/15 1122 11/09/15 0512 11/12/15 2321 11/13/15 0356  WBC 12.4* 10.6* 11.9* 10.7* 10.2  NEUTROABS  --   --   --  7.4 8.2*  HGB 10.3* 9.6* 9.6* 10.2* 10.2*  HCT 34.1* 31.4* 30.2* 33.3* 34.5*  MCV 80.8 80.5 79.3 80.4 81.2  PLT 437* 422*  389 471* [id number]*   Basic Metabolic Panel:  Recent Labs Lab 11/08/15 0141 11/08/15 1122 11/09/15 0512 11/12/15 2321 11/13/15 0356  NA 140  --  141 145 142  K 3.8  --  3.9 3.1* 3.3*  CL 103  --  107 107 104  CO2 25  --  26 29 26   GLUCOSE 153*  --  149* 143* 181*  BUN 11  --  7 12  12  CREATININE 0.81 0.69 0.75 0.72 0.75  CALCIUM 8.8*  --  8.2* 8.8* 8.4*  MG  --  1.4*  --   --   --   PHOS  --  3.4  --   --   --    GFR: Estimated Creatinine Clearance: 115.7 mL/min (by C-G formula based on Cr of 0.75). Liver Function Tests:  Recent Labs Lab 11/08/15 0141 11/08/15 1122  AST 24 22  ALT 16 15  ALKPHOS 88 82  BILITOT 0.6 0.7  PROT 7.4 6.8  ALBUMIN 3.7 3.2*   No results for input(s): LIPASE, AMYLASE in the last 168 hours.  Recent Labs Lab 11/08/15 0733  AMMONIA 17   Coagulation Profile:  Recent Labs Lab 11/13/15 0356  INR 1.19   Cardiac Enzymes: No results for input(s): CKTOTAL, CKMB, CKMBINDEX, TROPONINI in the last 168 hours. BNP (last 3 results) No results for input(s): PROBNP in the last 8760 hours. HbA1C: No results for input(s): HGBA1C in the last 72 hours. CBG:  Recent Labs Lab 11/08/15 1159 11/08/15 1715 11/08/15 2019 11/09/15 0734 11/13/15 0805  GLUCAP 127* 125* 107* 119* 215*   Lipid Profile: No results for input(s): CHOL, HDL, LDLCALC, TRIG, CHOLHDL, LDLDIRECT in the last 72 hours. Thyroid Function Tests: No results for input(s): TSH, T4TOTAL, FREET4, T3FREE, THYROIDAB in the last 72 hours. Anemia Panel: No results for input(s): VITAMINB12, FOLATE, FERRITIN, TIBC, IRON, RETICCTPCT in the last 72 hours. Urine analysis:    Component Value Date/Time   COLORURINE AMBER* 11/13/2015 0250   APPEARANCEUR CLOUDY* 11/13/2015 0250   LABSPEC 1.040* 11/13/2015 0250   PHURINE 6.0 11/13/2015 0250   GLUCOSEU NEGATIVE 11/13/2015 0250   HGBUR NEGATIVE 11/13/2015 0250   BILIRUBINUR NEGATIVE 11/13/2015 0250   KETONESUR NEGATIVE 11/13/2015 0250   PROTEINUR  100* 11/13/2015 0250   UROBILINOGEN 0.2 06/14/2013 1048   NITRITE NEGATIVE 11/13/2015 0250   LEUKOCYTESUR NEGATIVE 11/13/2015 0250   Sepsis Labs: @LABRCNTIP (procalcitonin:4,lacticidven:4)  ) Recent Results (from the past 240 hour(s))  Urine culture     Status: None   Collection Time: 11/08/15  3:39 AM  Result Value Ref Range Status   Specimen Description URINE, CATHETERIZED  Final   Special Requests Normal  Final   Culture   Final    NO GROWTH 1 DAY Performed at [facility]    Report Status 11/09/2015 FINAL  Final  Blood Culture (routine x 2)     Status: None (Preliminary result)   Collection Time: 11/08/15  5:40 AM  Result Value Ref Range Status   Specimen Description BLOOD LEFT FOREARM  Final   Special Requests BOTTLES DRAWN AEROBIC AND ANAEROBIC 5CC  Final   Culture   Final    NO GROWTH 4 DAYS Performed at [facility]    Report Status PENDING  Incomplete  Blood Culture (routine x 2)     Status: None (Preliminary result)   Collection Time: 11/08/15  5:46 AM  Result Value Ref Range Status   Specimen Description BLOOD LEFT HAND  Final   Special Requests BOTTLES DRAWN AEROBIC AND ANAEROBIC 5CC  Final   Culture   Final    NO GROWTH 4 DAYS Performed at [facility]    Report Status PENDING  Incomplete  MRSA PCR Screening     Status: None   Collection Time: 11/13/15  7:00 AM  Result Value Ref Range Status   MRSA by PCR NEGATIVE NEGATIVE Final    Comment:  The GeneXpert MRSA Assay (FDA approved for NASAL specimens only), is one component of a comprehensive MRSA colonization surveillance program. It is not intended to diagnose MRSA infection nor to guide or monitor treatment for MRSA infections.          Radiology Studies: Dg Chest 2 View  11/12/2015  CLINICAL DATA:  60 year old female with shortness of breath and wheezing. EXAM: CHEST  2 VIEW COMPARISON:  Chest radiograph dated 11/08/2015 FINDINGS: There is rotated positioning of  the patient. No definite consolidation noted. No pleural effusion or pneumothorax. There is cardiomegaly. 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 IMPRESSION: Limited study due to patient positioning and soft tissue attenuation. No definite acute cardiopulmonary process. Electronically Signed   By: Anner Crete M.D.   On: 11/12/2015 23:38   Ct Angio Chest Pe W/cm &/or Wo Cm  11/13/2015  CLINICAL DATA:  Dyspnea for 3 weeks. EXAM: CT ANGIOGRAPHY CHEST WITH CONTRAST TECHNIQUE: Multidetector CT imaging of the chest was performed using the standard protocol during bolus administration of intravenous contrast. Multiplanar CT image reconstructions and MIPs were obtained to evaluate the vascular anatomy. CONTRAST:  100 mL Isovue 370 intravenous COMPARISON:  04/28/2012 FINDINGS: Cardiovascular: Suboptimal opacification of the lobar and segmental arteries. No central emboli are evident. Lungs: Multifocal patchy airspace opacities, upper lobe predominant. Infectious infiltrate, inflammatory pneumonitis, air trapping are considerations. Central airways: Patent Effusions: None Lymphadenopathy: None Esophagus: Unremarkable Upper abdomen: No significant abnormality Musculoskeletal: No significant musculoskeletal lesion. Review of the MIP images confirms the above findings. IMPRESSION: No large central emboli. Study is limited for detection of lobar or segmental emboli. There are multifocal patchy airspace opacities which may represent air trapping, pneumonitis, infection. No effusions. Electronically Signed   By: Andreas Newport M.D.   On: 11/13/2015 05:42   Dg Chest Port 1 View  11/13/2015  CLINICAL DATA:  Progressive dyspnea for the past month non associated with cough or chest pain; shoulder replacement several weeks ago, history of lower extremity cellulitis EXAM: PORTABLE CHEST 1 VIEW COMPARISON:  CT scan chest of Nov 13, 2015 FINDINGS: The lungs are well-expanded. The interstitial  markings are coarse. There is no alveolar infiltrate. There is no pleural effusion or pneumothorax. The cardiac silhouette is enlarged. The pulmonary vascularity is mildly prominent centrally. The bony thorax exhibits no acute abnormality. 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 IMPRESSION: Cardiomegaly with mild central pulmonary vascular prominence. No alveolar pneumonia nor pulmonary edema nor other acute cardiopulmonary abnormality. Electronically Signed   By: David  Martinique M.D.   On: 11/13/2015 10:31        Scheduled Meds: . buPROPion  450 mg Oral q morning - 10a  . ceFEPime (MAXIPIME) IV  1 g Intravenous Q8H  . ceFEPime (MAXIPIME) IV  2 g Intravenous Once  . cholecalciferol  5,000 Units Oral Daily  . escitalopram  40 mg Oral q morning - 10a  . hydroxychloroquine  100 mg Oral QHS  . hydroxychloroquine  200 mg Oral Daily  . insulin aspart  0-9 Units Subcutaneous TID WC  . loratadine  10 mg Oral Daily  . losartan  100 mg Oral Daily  . lurasidone  120 mg Oral Daily  . morphine  30 mg Oral BID  . multivitamin with minerals  1 tablet Oral Daily  . nystatin cream  1 application Topical BID  . oxyCODONE  10 mg Oral Q8H  . pantoprazole  40 mg Oral Daily  . rOPINIRole  3 mg Oral QHS  . [  START ON 11/14/2015] vancomycin  1,500 mg Intravenous Q12H  . vancomycin  2,500 mg Intravenous Once   Continuous Infusions: . sodium chloride 100 mL/hr at 11/13/15 0736        Time spent in minutes: 44    [place], MD [facility] Pager: [email or web address] Password [email or web address] 11/13/2015, 11:12 AM

## 2015-11-13 NOTE — Progress Notes (Signed)
Inpatient Diabetes Program Recommendations  AACE/ADA: New Consensus Statement on Inpatient Glycemic Control (2015)  Target Ranges:  Prepandial:   less than 140 mg/dL      Peak postprandial:   less than 180 mg/dL (1-2 hours)      Critically ill patients:  140 - 180 mg/dL   Review of Glycemic Control  Diabetes history: DM2 Outpatient Diabetes medications: None Current orders for Inpatient glycemic control: Novolog sensitive tidwc  Results for [name], [name] (MRN [id number]) as of 11/13/2015 17:26  Ref. Range 11/13/2015 08:05 11/13/2015 11:42 11/13/2015 16:45  Glucose-Capillary Latest Ref Range: 65-99 mg/dL 215 (H) 192 (H) 168 (H)  Results for [name], [name] (MRN [id number]) as of 11/13/2015 17:26  Ref. Range 11/08/2015 11:22  Hemoglobin A1C Latest Ref Range: 4.8-5.6 % 7.0 (H)    Inpatient Diabetes Program Recommendations:    Increase Novolog to moderate tidwc and hs. ? Would pt benefit from referral to bariatric surgeon?  Will follow. Thank you. Lorenda Peck, RD, LDN, CDE Inpatient Diabetes Coordinator [phone]

## 2015-11-13 NOTE — Consult Note (Signed)
PULMONARY / CRITICAL CARE MEDICINE   Name: Jasmine Huff MRN: [id number] DOB: 1955/10/07    ADMISSION DATE:  11/12/2015 CONSULTATION DATE:  11/12/2015   REFERRING MD:  Wynelle Cleveland  CHIEF COMPLAINT:  Dyspnea  HISTORY OF PRESENT ILLNESS:   60 y/o female with a past medical history of Rheumatoid arthritis who had a shoulder replacement recently came to our hospital on 5/8 with dyspnea.  She says that she has been experiencing progressive dyspnea for one month without cough or chest pain. She had a shoulder replacement a few weeks back.  A few days prior to this admission she went to the ER for leg redness and was found to have cellulitis so she was treated with antibiotics.  She has had progressive dyspnea in the last several days since the ER visit.  She continues to have no cough and she denies fevers or chills.  She said that prior to admission she had some hypoxemia when she checked her O2 saturation with her brother's home oximeter.    She has treated her RA with plaquenil and narcotics.  It has not been worse recently.  Yesterday when she went for her CT scan she needed to use the BIPAP mask due to dyspnea.  PAST MEDICAL HISTORY :  She  has a past medical history of Allergy; Chronic pain syndrome; Knee pain; Urination frequency; Anemia; Sleep apnea; Migraine; Fibromyalgia; H/O hiatal hernia; Anxiety; Panic attacks; Hypertension; Depression; Insomnia; Shortness of breath dyspnea; History of migraine; Weakness; Restless leg; Arthritis; Joint pain; Chronic back pain; GERD (gastroesophageal reflux disease) (01/21/2011); Urinary urgency; Urinary frequency; and Nocturia.  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  Allergies  Allergen Reactions  . Asenapine Anaphylaxis, Shortness Of Breath and Other (See Comments)    Lips tingle and tongue becomes numb/Akasthesia  . Saphris [Asenapine Maleate] Anaphylaxis, Shortness Of Breath and Other (See Comments)    Lips tingle and tongue becomes numb/Akasthesia  . Seroquel [Quetiapine Fumarate] Shortness Of Breath and Other (See Comments)    "out of breath"  . Iodinated Diagnostic Agents Hives    Dye used for CT scans, severe hives  . Pregabalin Rash and Other (See Comments)    Alters Mental status, flushing  Caused by lyrica  . Buprenorphine Rash    No current facility-administered medications on file prior to encounter.   Current Outpatient Prescriptions on File Prior to Encounter  Medication Sig  . acetaminophen (TYLENOL) 500 MG tablet Take 1,000 mg by mouth every 6 (six) hours as needed for moderate pain.   Marland Kitchen ALPRAZolam (XANAX) 0.5 MG tablet Take 0.5 mg by mouth daily as needed for anxiety.  Marland Kitchen buPROPion (WELLBUTRIN XL) 150 MG 24 hr tablet Take 450 mg by mouth every morning.  . cefUROXime (CEFTIN) 500 MG tablet Take 1 tablet (500 mg total) by mouth 2 (two) times daily with a meal.  . cetirizine (ZYRTEC) 10 MG tablet Take 10 mg by mouth every evening.   . Cholecalciferol (VITAMIN D3) 5000 units TABS Take 5,000 Units by mouth daily.  Marland Kitchen escitalopram (LEXAPRO) 20 MG tablet Take 40 mg by mouth every morning.   . hydroxychloroquine (PLAQUENIL) 200 MG tablet Take 100-200 mg by mouth 2 (two) times daily. 200mg  in the  morning, 100mg  in the evening  . hydrOXYzine (ATARAX/VISTARIL) 25 MG tablet Take 25 mg by mouth every 8 (eight) hours as needed for nausea.   Marland Kitchen losartan (COZAAR) 100 MG tablet Take 100 mg by mouth daily.  . Lurasidone HCl (LATUDA) 120 MG TABS Take 120 mg by mouth daily.  Marland Kitchen morphine (MS CONTIN) 30 MG 12 hr tablet Take 30 mg by mouth 2 (two)  times daily.  . Multiple Vitamin (MULTIVITAMIN WITH MINERALS) TABS tablet Take 1 tablet by mouth daily.  . naproxen sodium (ANAPROX) 220 MG tablet Take 220 mg by mouth every 12 (twelve) hours as needed (for pain).  . nystatin cream (MYCOSTATIN) Apply 1 application topically 2 (two) times daily. Uses for yeast infection  . omeprazole (PRILOSEC) 40 MG capsule Take 40 mg by mouth every evening.   . Oxycodone HCl 10 MG TABS Take one 10 mg tablet every 4-6 hrs as needed for pain (Patient taking differently: Take 10 mg by mouth every 8 (eight) hours. )  . rOPINIRole (REQUIP) 3 MG tablet Take 3 mg by mouth at bedtime.  Marland Kitchen zolpidem (AMBIEN) 10 MG tablet Take 10 mg by mouth at bedtime as needed for sleep.    FAMILY HISTORY:  Her indicated that her mother is alive. She indicated that her father is deceased. She indicated that her brother is alive. She indicated that her maternal grandfather is deceased. She indicated that her other is deceased.   SOCIAL HISTORY: She  reports that she has never smoked. She has never used smokeless tobacco. She reports that she does not drink alcohol or use illicit drugs.  REVIEW OF SYSTEMS:   Gen: Denies fever, chills, weight change, fatigue, night sweats HEENT: Denies blurred vision, double vision, hearing loss, tinnitus, sinus congestion, rhinorrhea, sore throat, neck stiffness, dysphagia PULM: per HPI CV: Denies chest pain, edema, orthopnea, paroxysmal nocturnal dyspnea, palpitations GI: Denies abdominal pain, nausea, vomiting, diarrhea, hematochezia, melena, constipation, change in bowel habits GU: Denies dysuria, hematuria, polyuria, oliguria, urethral discharge Endocrine: Denies hot or cold intolerance, polyuria, polyphagia or appetite change Derm: Denies rash, dry skin, scaling or peeling skin change Heme: Denies easy bruising, bleeding, bleeding gums Neuro: Denies headache, numbness, weakness, slurred speech, loss of memory or consciousness  SUBJECTIVE:  As  above  VITAL SIGNS: BP 159/105 mmHg  Pulse 103  Temp(Src) 98.7 F (37.1 C) (Oral)  Resp 23  Ht 5\' 3"  (1.6 m)  SpO2 97%  HEMODYNAMICS:    VENTILATOR SETTINGS:    INTAKE / OUTPUT: I/O last 3 completed shifts: In: 28.4 [I.V.:28.4] Out: -   PHYSICAL EXAMINATION: General:  Mildly tachypneic Neuro:  Awake, alert, oriented x4, maew HEENT:  NCAT, OP clear, neck supple Cardiovascular:  RRR, no mgr, no JVD Lungs:  CTA B, normal effort Abdomen:  BS+, soft, nontender Musculoskeletal:  Left shoulder scar well healed Skin:  As per MSK  LABS:  BMET  Recent Labs Lab 11/09/15 0512 11/12/15 2321 11/13/15 0356  NA 141 145 142  K 3.9 3.1* 3.3*  CL 107 107 104  CO2 26 29 26   BUN 7 12 12   CREATININE 0.75 0.72 0.75  GLUCOSE 149* 143* 181*    Electrolytes  Recent Labs Lab 11/08/15 1122 11/09/15 0512 11/12/15 2321 11/13/15 0356  CALCIUM  --  8.2* 8.8* 8.4*  MG 1.4*  --   --   --   PHOS 3.4  --   --   --     CBC  Recent Labs Lab 11/09/15 [phone]  11/12/15 2321 11/13/15 0356  WBC 11.9* 10.7* 10.2  HGB 9.6* 10.2* 10.2*  HCT 30.2* 33.3* 34.5*  PLT 389 471* 471*    Coag's  Recent Labs Lab 11/13/15 0356  APTT 29  INR 1.19    Sepsis Markers  Recent Labs Lab 11/08/15 0251  LATICACIDVEN 1.88    ABG No results for input(s): PHART, PCO2ART, PO2ART in the last 168 hours.  Liver Enzymes  Recent Labs Lab 11/08/15 0141 11/08/15 1122  AST 24 22  ALT 16 15  ALKPHOS 88 82  BILITOT 0.6 0.7  ALBUMIN 3.7 3.2*    Cardiac Enzymes No results for input(s): TROPONINI, PROBNP in the last 168 hours.  Glucose  Recent Labs Lab 11/08/15 0147 11/08/15 1159 11/08/15 1715 11/08/15 2019 11/09/15 0734 11/13/15 0805  GLUCAP 146* 127* 125* 107* 119* 215*    Imaging Dg Chest 2 View  11/12/2015  CLINICAL DATA:  60 year old female with shortness of breath and wheezing. EXAM: CHEST  2 VIEW COMPARISON:  Chest radiograph dated 11/08/2015 FINDINGS: There is rotated  positioning of the patient. No definite consolidation noted. No pleural effusion or pneumothorax. There is cardiomegaly. 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 IMPRESSION: Limited study due to patient positioning and soft tissue attenuation. No definite acute cardiopulmonary process. Electronically Signed   By: Anner Crete M.D.   On: 11/12/2015 23:38   Ct Angio Chest Pe W/cm &/or Wo Cm  11/13/2015  CLINICAL DATA:  Dyspnea for 3 weeks. EXAM: CT ANGIOGRAPHY CHEST WITH CONTRAST TECHNIQUE: Multidetector CT imaging of the chest was performed using the standard protocol during bolus administration of intravenous contrast. Multiplanar CT image reconstructions and MIPs were obtained to evaluate the vascular anatomy. CONTRAST:  100 mL Isovue 370 intravenous COMPARISON:  04/28/2012 FINDINGS: Cardiovascular: Suboptimal opacification of the lobar and segmental arteries. No central emboli are evident. Lungs: Multifocal patchy airspace opacities, upper lobe predominant. Infectious infiltrate, inflammatory pneumonitis, air trapping are considerations. Central airways: Patent Effusions: None Lymphadenopathy: None Esophagus: Unremarkable Upper abdomen: No significant abnormality Musculoskeletal: No significant musculoskeletal lesion. Review of the MIP images confirms the above findings. IMPRESSION: No large central emboli. Study is limited for detection of lobar or segmental emboli. There are multifocal patchy airspace opacities which may represent air trapping, pneumonitis, infection. No effusions. Electronically Signed   By: Andreas Newport M.D.   On: 11/13/2015 05:42     STUDIES:  11/13/2015 CT chest > no evidence of PE but poorly timed contrast bolus, patchy airspace disease and ground glass in the upper lobes bilaterally worse RUL 5/9 TTE>  5/9 LE doppler >   CULTURES: MRSA swab negative  ANTIBIOTICS: Cefuroxime 5/8 > 5/9 Vanc 5/9 >  Cefepime 5/9 >   SIGNIFICANT  EVENTS:   LINES/TUBES:   DISCUSSION: 60 y/o female with a past medical history of rheumatoid arthritis here with dyspnea and hypoxemia of uncertain etiology.  Her imaging findings would suggest and infectious or inflammatory process but she does not have clinical syndrome consistent with pneumonia (no fever, chills, cough).  Given her underlying rheumatoid arthritis I suppose an inflammatory infiltrate from something like early organizing pneumonia or NSIP are possible, but this doesn't really fit the time course for those either.  Upper lobe predominance can suggest hypersensitivity pneumonitis or acute eosinophilic pneumonia.  I explained to her that the best approach may be to perform a bronchoscopy, but she is not willing to undergo this due to dyspnea she is experiencing right now.  So in leiu  of that will check serologies for evidence of an underlying or active connective tissue disease and will cover empirically for HCAP.  ASSESSMENT / PLAN:  PULMONARY A: Acute respiratory failure with hypoxemia Pulmonary infiltrates, see discussion above History of Rheumatoid arthritis Dyspnea P:   Check connective tissue disease serologies Monitor O2 saturation, give O2 to maintain O2 above > 90% Will need repeat imaging Check echo  CARDIOVASCULAR A:  Leg swelling > at risk for DVT P:  Check lower extremity doppler  INFECTIOUS A:   HCAP? P:   Start vancomycin and cefepime    FAMILY  - Updates: none bedside  Roselie Awkward, MD [facility] Pager: [phone] Cell: [phone] After 3pm or if no response, call [phone]  11/13/2015, 9:44 AM

## 2015-11-13 NOTE — H&P (Signed)
History and Physical    Jasmine Huff [id number] DOB: 1956-06-09 DOA: 11/12/2015  Referring MD/NP/PA: Dr. Rex Kras PCP: Lilian Coma, MD  Outpatient Specialists:  Patient coming from: Home   Chief Complaint: Shortness of breath  HPI: Jasmine Huff is a 60 y.o. female with medical history significant of morbid obesity, HTN,  anxiety, chronic pain, OSA noncompliant with CPAP, RSL, DM type II;  who presents with complaints of shortness of breath. Symptoms have been ongoing over the last 1 month and acutely worsened over the last week. She denies having any cough or cold symptoms. Associated symptoms include orthopnea. Her pulse oximetry usually stays around 92-93%. After walking O2 sats can drop as low as 80%. She was just last admitted to the hospital on 5/4 and discharged on 5/5 for metabolic encephalopathy found to have a left lower extremity cellulitis. She was initially started on Ancef and then switched over to by mouth Ceftin which she reports taking as advised. She feels like her left leg is getting somewhat better, but she notes that for the last 3 days she's had increased swelling and pain. Pain in the affected leg worsened with ambulation. She notes that she also underwent total shoulder arthroplasty back on 09/27/2015. As a result of the recent antibiotic she notes that she's had diarrhea since leaving the hospital. Denies having any chest pain, urinary frequency, fever, chills, change in weight, wheezing, blood in urine/stool, focal weakness, or loss of consciousness.  ED Course: Upon admission into the emergency department the patient was seen to be afebrile, heart rate up to 110, respirations up to 29, blood pressure to 212/114, O2 saturations seem to be as low as 60-70% on room air. Lab work revealed WBC 10.7, hemoglobin 10.2, platelets 471, potassium 3.1, calcium 8.8, glucose 143, and d-dimer elevated at 0.88. Patient was noted to have refused ABG that was ordered by the ED physician.  Patient was taken to CT scanner, but was significantly dyspneic. The study was unable to be performed and the patient was brought back to the emergency room in which initial O2 sats are seen to be in the 70s and patient was placed on a NRB then BiPAP mask with improvement of O2 saturations to 100%.   Review of Systems: As per HPI otherwise 10 point review of systems negative.    Past Medical History  Diagnosis Date  . Allergy   . Chronic pain syndrome   . Knee pain   . Urination frequency   . Anemia   . Sleep apnea     CPAP  . Migraine   . Fibromyalgia   . H/O hiatal hernia   . Anxiety     takes Xanax daily as needed  . Panic attacks   . Hypertension     takes Losartan daily  . Depression     takes Latuda,Wellbutrin,Lexapro daily  . Insomnia     takes Ambien nightly  . Shortness of breath dyspnea     with exertion  . History of migraine     last one a wk ago;takes Excedrine as needed  . Weakness     left arm  . Restless leg     takes Requip nightly  . Arthritis     RA  . Joint pain   . Chronic back pain     bulding disc and arthritis.  Marland Kitchen GERD (gastroesophageal reflux disease) 01/21/2011    takes omeprazole daily  . Urinary urgency   . Urinary frequency   . Nocturia  Past Surgical History  Procedure Laterality Date  . Abdominal hysterectomy    . Right ovary removal    . Left hemicolectomy with splenic flexure mobilization and end colostomy  04/04/2012  . Laparotomy  04/04/2012    Procedure: EXPLORATORY LAPAROTOMY;  Surgeon: Leighton Ruff, MD;  Location: [facility];  Service: General;  Laterality: N/A;  left colectomy  . Colostomy  04/04/2012    Procedure: COLOSTOMY;  Surgeon: Leighton Ruff, MD;  Location: [facility];  Service: General;  Laterality: N/A;  . Colonoscopy N/A 11/12/2012    Procedure: COLONOSCOPY;  Surgeon: Leighton Ruff, MD;  Location: [facility];  Service: Endoscopy;  Laterality: N/A;  . Colon surgery  Oct 2013    perf bowel w/ colostomy  .  Cholecystectomy  10 yrs ago  . Colostomy reversal N/A 02/17/2013    Procedure:  LAPAROSCOPIC COLOSTOMY REVERSAL; SMALL BOWEL RESECTION; LYSIS OF ADHESIONS ;  Surgeon: Leighton Ruff, MD;  Location: [facility];  Service: General;  Laterality: N/A;  . Colonoscopy    . Refractive surgery    . Total shoulder arthroplasty Left 09/27/2015    Procedure: TOTAL SHOULDER ARTHROPLASTY;  Surgeon: Tania Ade, MD;  Location: [facility];  Service: Orthopedics;  Laterality: Left;  Left shoulder arthroplasty     reports that she has never smoked. She has never used smokeless tobacco. She reports that she does not drink alcohol or use illicit drugs.  Allergies  Allergen Reactions  . Asenapine Anaphylaxis, Shortness Of Breath and Other (See Comments)    Lips tingle and tongue becomes numb/Akasthesia  . Saphris [Asenapine Maleate] Anaphylaxis, Shortness Of Breath and Other (See Comments)    Lips tingle and tongue becomes numb/Akasthesia  . Seroquel [Quetiapine Fumarate] Shortness Of Breath and Other (See Comments)    "out of breath"  . Iodinated Diagnostic Agents Hives    Dye used for CT scans, severe hives  . Pregabalin Rash and Other (See Comments)    Alters Mental status, flushing  Caused by lyrica  . Buprenorphine Rash    Family History  Problem Relation Age of Onset  . Depression Mother   . Depression Father   . Alcohol abuse Brother   . Depression Brother   . Depression Maternal Grandfather   . Depression Other     Prior to Admission medications   Medication Sig Start Date End Date Taking? Authorizing Provider  acetaminophen (TYLENOL) 500 MG tablet Take 1,000 mg by mouth every 6 (six) hours as needed for moderate pain.    Yes Historical Provider, MD  ALPRAZolam Duanne Moron) 0.5 MG tablet Take 0.5 mg by mouth daily as needed for anxiety.   Yes Historical Provider, MD  buPROPion (WELLBUTRIN XL) 150 MG 24 hr tablet Take 450 mg by mouth every morning.   Yes Historical Provider, MD  cefUROXime (CEFTIN) 500  MG tablet Take 1 tablet (500 mg total) by mouth 2 (two) times daily with a meal. 11/09/15  Yes Maryann Mikhail, DO  cetirizine (ZYRTEC) 10 MG tablet Take 10 mg by mouth every evening.    Yes Historical Provider, MD  Cholecalciferol (VITAMIN D3) 5000 units TABS Take 5,000 Units by mouth daily.   Yes Historical Provider, MD  escitalopram (LEXAPRO) 20 MG tablet Take 40 mg by mouth every morning.  10/12/12  Yes Historical Provider, MD  hydroxychloroquine (PLAQUENIL) 200 MG tablet Take 100-200 mg by mouth 2 (two) times daily. 200mg  in the morning, 100mg  in the evening 08/04/11  Yes Historical Provider, MD  hydrOXYzine (ATARAX/VISTARIL) 25 MG tablet  Take 25 mg by mouth every 8 (eight) hours as needed for nausea.    Yes Historical Provider, MD  losartan (COZAAR) 100 MG tablet Take 100 mg by mouth daily.   Yes Historical Provider, MD  Lurasidone HCl (LATUDA) 120 MG TABS Take 120 mg by mouth daily.   Yes Historical Provider, MD  morphine (MS CONTIN) 30 MG 12 hr tablet Take 30 mg by mouth 2 (two) times daily.   Yes Historical Provider, MD  Multiple Vitamin (MULTIVITAMIN WITH MINERALS) TABS tablet Take 1 tablet by mouth daily.   Yes Historical Provider, MD  naproxen sodium (ANAPROX) 220 MG tablet Take 220 mg by mouth every 12 (twelve) hours as needed (for pain).   Yes Historical Provider, MD  nystatin cream (MYCOSTATIN) Apply 1 application topically 2 (two) times daily. Uses for yeast infection   Yes Historical Provider, MD  omeprazole (PRILOSEC) 40 MG capsule Take 40 mg by mouth every evening.  11/16/12  Yes Historical Provider, MD  Oxycodone HCl 10 MG TABS Take one 10 mg tablet every 4-6 hrs as needed for pain Patient taking differently: Take 10 mg by mouth every 8 (eight) hours.  09/28/15  Yes Danielle Laliberte, PA-C  rOPINIRole (REQUIP) 3 MG tablet Take 3 mg by mouth at bedtime.   Yes Historical Provider, MD  zolpidem (AMBIEN) 10 MG tablet Take 10 mg by mouth at bedtime as needed for sleep.   Yes Historical  Provider, MD    Physical Exam: Filed Vitals:   11/13/15 0100 11/13/15 0200 11/13/15 0300 11/13/15 0340  BP: 166/88 166/115 160/87   Pulse:  110 102 103  Temp:      TempSrc:      Resp: 17 29 20 19   SpO2:  94% 96% 100%      Constitutional: Obese female in some mild distress on BiPAP mask Filed Vitals:   11/13/15 0100 11/13/15 0200 11/13/15 0300 11/13/15 0340  BP: 166/88 166/115 160/87   Pulse:  110 102 103  Temp:      TempSrc:      Resp: 17 29 20 19   SpO2:  94% 96% 100%   Eyes: PERRL, lids and conjunctivae normal ENMT: Mucous membranes are moist. Posterior pharynx clear of any exudate or lesions.Normal dentition.  Neck: normal, supple, no masses, no thyromegaly Respiratory: Decreased aeration, no wheezes appreciated, and mildly tachypneic Cardiovascular: Tachycardic, but trace lower extremity edema. Abdomen: no tenderness, no masses palpated. No hepatosplenomegaly. Bowel sounds positive.  Musculoskeletal: no clubbing / cyanosis. No joint deformity upper and lower extremities. Good ROM, no contractures. Normal muscle tone.  Skin: Mild erythema noted to the left lower leg. Neurologic: CN 2-12 grossly intact. Sensation intact, DTR normal. Strength 5/5 in all 4.  Psychiatric: Normal judgment and insight. Alert and oriented x 3. Normal mood.    Labs on Admission: I have personally reviewed following labs and imaging studies  CBC:  Recent Labs Lab 11/08/15 0141 11/08/15 1122 11/09/15 0512 11/12/15 2321  WBC 12.4* 10.6* 11.9* 10.7*  NEUTROABS  --   --   --  7.4  HGB 10.3* 9.6* 9.6* 10.2*  HCT 34.1* 31.4* 30.2* 33.3*  MCV 80.8 80.5 79.3 80.4  PLT 437* 422* 389 [id number]*   Basic Metabolic Panel:  Recent Labs Lab 11/08/15 0141 11/08/15 1122 11/09/15 0512 11/12/15 2321  NA 140  --  141 145  K 3.8  --  3.9 3.1*  CL 103  --  107 107  CO2 25  --  26 29  GLUCOSE 153*  --  149* 143*  BUN 11  --  7 12  CREATININE 0.81 0.69 0.75 0.72  CALCIUM 8.8*  --  8.2* 8.8*  MG  --   1.4*  --   --   PHOS  --  3.4  --   --    GFR: Estimated Creatinine Clearance: 115.7 mL/min (by C-G formula based on Cr of 0.72). Liver Function Tests:  Recent Labs Lab 11/08/15 0141 11/08/15 1122  AST 24 22  ALT 16 15  ALKPHOS 88 82  BILITOT 0.6 0.7  PROT 7.4 6.8  ALBUMIN 3.7 3.2*   No results for input(s): LIPASE, AMYLASE in the last 168 hours.  Recent Labs Lab 11/08/15 0733  AMMONIA 17   Coagulation Profile: No results for input(s): INR, PROTIME in the last 168 hours. Cardiac Enzymes: No results for input(s): CKTOTAL, CKMB, CKMBINDEX, TROPONINI in the last 168 hours. BNP (last 3 results) No results for input(s): PROBNP in the last 8760 hours. HbA1C: No results for input(s): HGBA1C in the last 72 hours. CBG:  Recent Labs Lab 11/08/15 0147 11/08/15 1159 11/08/15 1715 11/08/15 2019 11/09/15 0734  GLUCAP 146* 127* 125* 107* 119*   Lipid Profile: No results for input(s): CHOL, HDL, LDLCALC, TRIG, CHOLHDL, LDLDIRECT in the last 72 hours. Thyroid Function Tests: No results for input(s): TSH, T4TOTAL, FREET4, T3FREE, THYROIDAB in the last 72 hours. Anemia Panel: No results for input(s): VITAMINB12, FOLATE, FERRITIN, TIBC, IRON, RETICCTPCT in the last 72 hours. Urine analysis:    Component Value Date/Time   COLORURINE AMBER* 11/13/2015 0250   APPEARANCEUR CLOUDY* 11/13/2015 0250   LABSPEC 1.040* 11/13/2015 0250   PHURINE 6.0 11/13/2015 0250   GLUCOSEU NEGATIVE 11/13/2015 0250   HGBUR NEGATIVE 11/13/2015 0250   BILIRUBINUR NEGATIVE 11/13/2015 0250   KETONESUR NEGATIVE 11/13/2015 0250   PROTEINUR 100* 11/13/2015 0250   UROBILINOGEN 0.2 06/14/2013 1048   NITRITE NEGATIVE 11/13/2015 0250   LEUKOCYTESUR NEGATIVE 11/13/2015 0250   Sepsis Labs:  Recent Results (from the past 240 hour(s))  Urine culture     Status: None   Collection Time: 11/08/15  3:39 AM  Result Value Ref Range Status   Specimen Description URINE, CATHETERIZED  Final   Special Requests  Normal  Final   Culture   Final    NO GROWTH 1 DAY Performed at [facility]    Report Status 11/09/2015 FINAL  Final  Blood Culture (routine x 2)     Status: None (Preliminary result)   Collection Time: 11/08/15  5:40 AM  Result Value Ref Range Status   Specimen Description BLOOD LEFT FOREARM  Final   Special Requests BOTTLES DRAWN AEROBIC AND ANAEROBIC 5CC  Final   Culture   Final    NO GROWTH 4 DAYS Performed at [facility]    Report Status PENDING  Incomplete  Blood Culture (routine x 2)     Status: None (Preliminary result)   Collection Time: 11/08/15  5:46 AM  Result Value Ref Range Status   Specimen Description BLOOD LEFT HAND  Final   Special Requests BOTTLES DRAWN AEROBIC AND ANAEROBIC 5CC  Final   Culture   Final    NO GROWTH 4 DAYS Performed at [facility]    Report Status PENDING  Incomplete     Radiological Exams on Admission: Dg Chest 2 View  11/12/2015  CLINICAL DATA:  60 year old female with shortness of breath and wheezing. EXAM: CHEST  2 VIEW COMPARISON:  Chest radiograph  dated 11/08/2015 FINDINGS: There is rotated positioning of the patient. No definite consolidation noted. No pleural effusion or pneumothorax. There is cardiomegaly. 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 IMPRESSION: Limited study due to patient positioning and soft tissue attenuation. No definite acute cardiopulmonary process. Electronically Signed   By: Anner Crete M.D.   On: 11/12/2015 23:38    EKG: Independently reviewed.Sinus rhythm with a right bundle branch block similar to previous tracings   Assessment/Plan Acute Respiratory failure with hypoxia: Exact cause of patient's symptoms unknown. Question underlying possibility of a blood clot with elevated d-dimer. Also on the differential includes heart failure. - Admit to stepdown - continuous pulse oximetry - Bipap, wean to room air as tolerated - DuoNeb's prn - Check  venous Doppler ultrasound of bilateral lower extremity - Consider rechecking CT angiogram if doppler ultrasound negative - Consider need for echocardiogram  SIRS - Continue to monitor  Elevated d-dimer: D-dimer 0.88 on admission - Checking venous Doppler ultrasound of the lower extremities  Cellulitis of the left lower extremity - Continue Ceftin  Hypokalemia: Patient's initial potassium was seen to be low at 3.1 replaced with 60 mEq in the ED - Check BMP and replace as needed  Essential hypertension - Continue losartan   Chronic pain -  Continue MS Contin, oxycodone when necessary  Diabetes mellitus type 2: Last hemoglobin A1c obtained 11/08/2015 was noted to be 7. - CBGs every before meals  with a sensitive sliding scale insulin for now   Restless leg syndrome - Continue ropinirole   Anxiety/depression - Continue Lexapro and Wellbutrin   Insomnia - Continue Ambien    GERD - Pharmacy substitution for Protonix    DVT prophylaxis: lovenox Code Status:  Full Family Communication: None Disposition Plan: undetermined Consults called: None Admission status: Inpatient    Norval Morton MD [facility] Pager [phone]  If 7PM-7AM, please contact night-coverage [email or web address] Password [email or web address]  11/13/2015, 3:56 AM

## 2015-11-13 NOTE — Progress Notes (Signed)
ANTICOAGULATION CONSULT NOTE - Initial Consult  Pharmacy Consult for IV Heparin Indication: pulmonary embolus  Allergies  Allergen Reactions  . Asenapine Anaphylaxis, Shortness Of Breath and Other (See Comments)    Lips tingle and tongue becomes numb/Akasthesia  . Saphris [Asenapine Maleate] Anaphylaxis, Shortness Of Breath and Other (See Comments)    Lips tingle and tongue becomes numb/Akasthesia  . Seroquel [Quetiapine Fumarate] Shortness Of Breath and Other (See Comments)    "out of breath"  . Iodinated Diagnostic Agents Hives    Dye used for CT scans, severe hives  . Pregabalin Rash and Other (See Comments)    Alters Mental status, flushing  Caused by lyrica  . Buprenorphine Rash    Patient Measurements:   Heparin Dosing Weight: 85 kg  Vital Signs: Temp: 99.1 F (37.3 C) (05/08 2229) Temp Source: Oral (05/08 2229) BP: 160/87 mmHg (05/09 0300) Pulse Rate: 103 (05/09 0340)  Labs:  Recent Labs  11/12/15 2321  HGB 10.2*  HCT 33.3*  PLT 471*  CREATININE 0.72    Estimated Creatinine Clearance: 115.7 mL/min (by C-G formula based on Cr of 0.72).   Medical History: Past Medical History  Diagnosis Date  . Allergy   . Chronic pain syndrome   . Knee pain   . Urination frequency   . Anemia   . Sleep apnea     CPAP  . Migraine   . Fibromyalgia   . H/O hiatal hernia   . Anxiety     takes Xanax daily as needed  . Panic attacks   . Hypertension     takes Losartan daily  . Depression     takes Latuda,Wellbutrin,Lexapro daily  . Insomnia     takes Ambien nightly  . Shortness of breath dyspnea     with exertion  . History of migraine     last one a wk ago;takes Excedrine as needed  . Weakness     left arm  . Restless leg     takes Requip nightly  . Arthritis     RA  . Joint pain   . Chronic back pain     bulding disc and arthritis.  Marland Kitchen GERD (gastroesophageal reflux disease) 01/21/2011    takes omeprazole daily  . Urinary urgency   . Urinary frequency    . Nocturia     Medications:  Scheduled:  . heparin  5,000 Units Intravenous Once   Infusions:  . heparin      Assessment: 4 yoF c/o SOB.  + PE.  IV heparin per Rx Goal of Therapy:  Heparin level 0.3-0.7 units/ml Monitor platelets by anticoagulation protocol: Yes   Plan:  Baseline coags Stat Heparin 5000 unit bolus x1 Start drip @ 1550 units/hr Daily CBC/HL Check 1st HL in 6 hours  Dorrene German 11/13/2015,4:02 AM

## 2015-11-13 NOTE — Progress Notes (Signed)
  Echocardiogram 2D Echocardiogram has been performed.  Donata Clay 11/13/2015, 1:25 PM

## 2015-11-13 NOTE — Progress Notes (Signed)
Pharmacy Antibiotic Note  Jasmine Huff is a 60 y.o. female with rheumatoid arthritis admitted on 11/12/2015 with hypoxia. Pharmacy has been consulted for vancomycin and cefepime dosing empiric coverage of HCAP.  Plan:  Vancomycin 2.5g IV x 1, then 1500mg  IV q12h, goal trough 15-20 mcg/ml  Cefepime 2g IV x 1, then 1g IV q8h Follow up renal function & cultures  Height: 5\' 3"  (160 cm) IBW/kg (Calculated) : 52.4  Temp (24hrs), Avg:98.9 F (37.2 C), Min:98.7 F (37.1 C), Max:99.1 F (37.3 C)   Recent Labs Lab 11/08/15 0141 11/08/15 0251 11/08/15 1122 11/09/15 0512 11/12/15 2321 11/13/15 0356  WBC 12.4*  --  10.6* 11.9* 10.7* 10.2  CREATININE 0.81  --  0.69 0.75 0.72 0.75  LATICACIDVEN  --  1.88  --   --   --   --     Estimated Creatinine Clearance: 115.7 mL/min (by C-G formula based on Cr of 0.75).    Allergies  Allergen Reactions  . Asenapine Anaphylaxis, Shortness Of Breath and Other (See Comments)    Lips tingle and tongue becomes numb/Akasthesia  . Saphris [Asenapine Maleate] Anaphylaxis, Shortness Of Breath and Other (See Comments)    Lips tingle and tongue becomes numb/Akasthesia  . Seroquel [Quetiapine Fumarate] Shortness Of Breath and Other (See Comments)    "out of breath"  . Iodinated Diagnostic Agents Hives    Dye used for CT scans, severe hives  . Pregabalin Rash and Other (See Comments)    Alters Mental status, flushing  Caused by lyrica  . Buprenorphine Rash    Antimicrobials this admission: PTA Ceftin >> 5/9 5/9 Vancomycin >> 5/9 Cefepime >>  Dose adjustments this admission: ---  Microbiology results: 5/9 MRSA PCR (-)  Thank you for allowing pharmacy to be a part of this patient's care.  Peggyann Juba, PharmD, BCPS Pager: [phone] 11/13/2015 10:46 AM

## 2015-11-13 NOTE — ED Notes (Signed)
Pt attempted to use the Female urinal without success and states that she is unable to use the bedpan or get up to the BR; pt requests F/C to be placed

## 2015-11-13 NOTE — Progress Notes (Addendum)
*  PRELIMINARY RESULTS* Vascular Ultrasound Left lower extremity venous duplex has been completed.  Preliminary findings: Technically difficult due to body habitus. No obvious evidence of DVT or baker's cyst.   Landry Mellow, RDMS, RVT  11/13/2015, 10:45 AM

## 2015-11-13 NOTE — ED Notes (Signed)
Respiratory attempted ABG x 1; pt refused to be stuck for ABG again; Dr Rex Kras made aware

## 2015-11-13 NOTE — Progress Notes (Signed)
   11/13/15 2313  BiPAP/CPAP/SIPAP  BiPAP/CPAP/SIPAP Pt Type Adult  Mask Type Full face mask  Mask Size Medium  Set Rate 0 breaths/min  Respiratory Rate 14 breaths/min  IPAP 12 cmH20  EPAP 12 cmH2O  Oxygen Percent 40 %  Flow Rate 0 lpm  Minute Ventilation 8.4  Leak 0  Peak Inspiratory Pressure (PIP) 12  Tidal Volume (Vt) 562  BiPAP/CPAP/SIPAP CPAP  Patient Home Equipment No  BiPAP/CPAP /SiPAP Vitals  Pulse Rate 90  Resp 14  SpO2 99 %

## 2015-11-13 NOTE — Progress Notes (Signed)
   11/13/15 0340  BiPAP/CPAP/SIPAP  BiPAP/CPAP/SIPAP Pt Type Adult  Mask Type Full face mask  Mask Size Medium  Set Rate 12 breaths/min  Respiratory Rate 19 breaths/min  IPAP 14 cmH20  EPAP 6 cmH2O  Oxygen Percent 60 %  Flow Rate 0 lpm  Minute Ventilation 10.2  Leak 0  Peak Inspiratory Pressure (PIP) 14  Tidal Volume (Vt) 505  BiPAP/CPAP/SIPAP BiPAP  Patient Home Equipment No  Auto Titrate No  BiPAP/CPAP /SiPAP Vitals  Pulse Rate 103  Resp 19  SpO2 100 %

## 2015-11-14 DIAGNOSIS — R0600 Dyspnea, unspecified: Secondary | ICD-10-CM | POA: Insufficient documentation

## 2015-11-14 DIAGNOSIS — J189 Pneumonia, unspecified organism: Secondary | ICD-10-CM | POA: Insufficient documentation

## 2015-11-14 DIAGNOSIS — R400 Somnolence: Secondary | ICD-10-CM | POA: Insufficient documentation

## 2015-11-14 LAB — BASIC METABOLIC PANEL
Anion gap: 8 (ref 5–15)
BUN: 20 mg/dL (ref 6–20)
CHLORIDE: 107 mmol/L (ref 101–111)
CO2: 29 mmol/L (ref 22–32)
CREATININE: 1.11 mg/dL — AB (ref 0.44–1.00)
Calcium: 8.2 mg/dL — ABNORMAL LOW (ref 8.9–10.3)
GFR, EST NON AFRICAN AMERICAN: 53 mL/min — AB (ref >60)
Glucose, Bld: 160 mg/dL — ABNORMAL HIGH (ref 65–99)
POTASSIUM: 3.9 mmol/L (ref 3.5–5.1)
SODIUM: 144 mmol/L (ref 135–145)

## 2015-11-14 LAB — CBC WITH DIFFERENTIAL/PLATELET
BASOS ABS: 0 10*3/uL (ref 0.0–0.1)
Basophils Relative: 0 %
EOS PCT: 1 %
Eosinophils Absolute: 0.1 10*3/uL (ref 0.0–0.7)
HCT: 32 % — ABNORMAL LOW (ref 36.0–46.0)
HEMOGLOBIN: 9.5 g/dL — AB (ref 12.0–15.0)
Lymphocytes Relative: 14 %
Lymphs Abs: 1.8 10*3/uL (ref 0.7–4.0)
MCH: 24.4 pg — ABNORMAL LOW (ref 26.0–34.0)
MCHC: 29.7 g/dL — ABNORMAL LOW (ref 30.0–36.0)
MCV: 82.3 fL (ref 78.0–100.0)
Monocytes Absolute: 1.7 10*3/uL — ABNORMAL HIGH (ref 0.1–1.0)
Monocytes Relative: 13 %
NEUTROS PCT: 72 %
Neutro Abs: 9.4 10*3/uL — ABNORMAL HIGH (ref 1.7–7.7)
PLATELETS: 495 10*3/uL — AB (ref 150–400)
RBC: 3.89 MIL/uL (ref 3.87–5.11)
RDW: 17.1 % — ABNORMAL HIGH (ref 11.5–15.5)
WBC: 12.9 10*3/uL — AB (ref 4.0–10.5)

## 2015-11-14 LAB — GLUCOSE, CAPILLARY
GLUCOSE-CAPILLARY: 133 mg/dL — AB (ref 65–99)
GLUCOSE-CAPILLARY: 118 mg/dL — AB (ref 65–99)
GLUCOSE-CAPILLARY: 164 mg/dL — AB (ref 65–99)
Glucose-Capillary: 140 mg/dL — ABNORMAL HIGH (ref 65–99)
Glucose-Capillary: 129 mg/dL — ABNORMAL HIGH (ref 65–99)

## 2015-11-14 LAB — CYCLIC CITRUL PEPTIDE ANTIBODY, IGG/IGA: CCP ANTIBODIES IGG/IGA: 44 U — AB (ref 0–19)

## 2015-11-14 LAB — ANTINUCLEAR ANTIBODIES, IFA: ANA Ab, IFA: NEGATIVE

## 2015-11-14 LAB — SJOGRENS SYNDROME-A EXTRACTABLE NUCLEAR ANTIBODY: SSA (Ro) (ENA) Antibody, IgG: <0.2 AI (ref 0.0–0.9)

## 2015-11-14 LAB — ANTI-JO 1 ANTIBODY, IGG

## 2015-11-14 LAB — ANTI-DNA ANTIBODY, DOUBLE-STRANDED: ds DNA Ab: 2 IU/mL (ref 0–9)

## 2015-11-14 LAB — SJOGRENS SYNDROME-B EXTRACTABLE NUCLEAR ANTIBODY

## 2015-11-14 LAB — ANTI-SCLERODERMA ANTIBODY

## 2015-11-14 MED ORDER — VANCOMYCIN HCL 10 G IV SOLR
1250.0000 mg | Freq: Two times a day (BID) | INTRAVENOUS | Status: DC
  Administered 2015-11-14 – 2015-11-15 (×2): 1250 mg via INTRAVENOUS
  Filled 2015-11-14 (×3): qty 1250

## 2015-11-14 MED ORDER — POTASSIUM CHLORIDE CRYS ER 20 MEQ PO TBCR
40.0000 meq | EXTENDED_RELEASE_TABLET | Freq: Two times a day (BID) | ORAL | Status: AC
  Administered 2015-11-14 (×2): 40 meq via ORAL
  Filled 2015-11-14 (×2): qty 2

## 2015-11-14 MED ORDER — FUROSEMIDE 10 MG/ML IJ SOLN
40.0000 mg | Freq: Four times a day (QID) | INTRAMUSCULAR | Status: AC
  Administered 2015-11-14 (×2): 40 mg via INTRAVENOUS
  Filled 2015-11-14 (×3): qty 4

## 2015-11-14 MED ORDER — OXYCODONE HCL 5 MG PO TABS
5.0000 mg | ORAL_TABLET | Freq: Three times a day (TID) | ORAL | Status: DC | PRN
  Administered 2015-11-14 – 2015-11-16 (×6): 5 mg via ORAL
  Filled 2015-11-14 (×6): qty 1

## 2015-11-14 NOTE — Progress Notes (Signed)
NUTRITION NOTE  Consult received: For weight loss- low carb, low fat, low sodium diet.  Attempted to see pt to discuss low Na diet today. Pt reports that she is feeling very drowsy and requests RD visit tomorrow morning to provide education. Dr. Reggy Eye note 5/9 states discussion concerning DM diet was had and that pt will need Metformin on d/c. HgbA1c on 11/08/15: 7%. RD feels that pt would benefit from outpatient nutrition counseling given need for diet education related to multiple topics and better learning ability outside of acute illness/hospital setting could likely occur.  RD to follow-up 5/11 for low Na diet education.   Jarome Matin, RD, LDN Inpatient Clinical Dietitian Pager # [phone] After hours/weekend pager # [phone]

## 2015-11-14 NOTE — Progress Notes (Signed)
Pt has refused CPAP for the night.  RT to monitor and assess as needed.  

## 2015-11-14 NOTE — Progress Notes (Signed)
Pharmacy Antibiotic Note  Jasmine Huff is a 60 y.o. female with rheumatoid arthritis admitted on 11/12/2015 with hypoxia. Pharmacy has been consulted for vancomycin and cefepime dosing empiric coverage of HCAP.  Today, 11/14/2015  Day #2 antibiotics  Plan:  Decrease Vancomycin to 1250mg  IV q12h for rising SCr, CrCl 61 ml/min/1.49m2 (normalized)  Continue Cefepime 1g IV q8h  Monitor renal function  Follow up clinical progress and plans for de-escalation  Height: 5\' 3"  (160 cm) Weight: (!) 360 lb 7.2 oz (163.5 kg) IBW/kg (Calculated) : 52.4  Temp (24hrs), Avg:98.8 F (37.1 C), Min:97.5 F (36.4 C), Max:99.9 F (37.7 C)   Recent Labs Lab 11/08/15 0251 11/08/15 1122 11/09/15 0512 11/12/15 2321 11/13/15 0356 11/14/15 0321  WBC  --  10.6* 11.9* 10.7* 10.2 12.9*  CREATININE  --  0.69 0.75 0.72 0.75 1.11*  LATICACIDVEN 1.88  --   --   --   --   --     Estimated Creatinine Clearance: 83.4 mL/min (by C-G formula based on Cr of 1.11).    Allergies  Allergen Reactions  . Asenapine Anaphylaxis, Shortness Of Breath and Other (See Comments)    Lips tingle and tongue becomes numb/Akasthesia  . Saphris [Asenapine Maleate] Anaphylaxis, Shortness Of Breath and Other (See Comments)    Lips tingle and tongue becomes numb/Akasthesia  . Seroquel [Quetiapine Fumarate] Shortness Of Breath and Other (See Comments)    "out of breath"  . Iodinated Diagnostic Agents Hives    Dye used for CT scans, severe hives  . Pregabalin Rash and Other (See Comments)    Alters Mental status, flushing  Caused by lyrica  . Buprenorphine Rash    Antimicrobials this admission: PTA Ceftin >> 5/9 5/9 Vancomycin >> 5/9 Cefepime >>  Dose adjustments this admission: ---  Microbiology results: 5/9 MRSA PCR (-)  Thank you for allowing pharmacy to be a part of this patient's care.  Peggyann Juba, PharmD, BCPS Pager: [phone] 11/14/2015 10:19 AM

## 2015-11-14 NOTE — Progress Notes (Signed)
Patient removed cpap machine because "it was uncomfortable and unable to get any sleep because of the mask". Mask readjusted by RN and RT multiple times but patient still refuses. Patient understands the purpose of the cpap machine and still would like to keep machine off because "she doesn't wear it at home." Patient placed to 2L [place]. CPAP worn from 2315-0140. Will continue to monitor.

## 2015-11-14 NOTE — Progress Notes (Signed)
PULMONARY / CRITICAL CARE MEDICINE   Name: Jasmine Huff MRN: [id number] DOB: 1956-03-18    ADMISSION DATE:  11/12/2015 CONSULTATION DATE:  11/12/2015   REFERRING MD:  Wynelle Cleveland  CHIEF COMPLAINT:  Dyspnea  BRIEF:  60 y/o female with rheumatoid arthritis admitted on 5/10 with dyspnea and mild hypoxemia with abnormal pulmonary infiltrates.   SUBJECTIVE:  Refused CPAP Sleepy Dyspnea the same per her Has not been out of bed Non-productive cough Has significant urinary retention, asking for foley   VITAL SIGNS: BP 140/56 mmHg  Pulse 97  Temp(Src) 98.8 F (37.1 C) (Oral)  Resp 18  Ht 5\' 3"  (1.6 m)  Wt 360 lb 7.2 oz (163.5 kg)  BMI 63.87 kg/m2  SpO2 100%  HEMODYNAMICS:    VENTILATOR SETTINGS:    INTAKE / OUTPUT: I/O last 3 completed shifts: In: 3232.2 [P.O.:300; I.V.:1782.2; IV Piggyback:1150] Out: 800 [Urine:800]  PHYSICAL EXAMINATION: General:  No respiratory distress Neuro:  Awake, alert, oriented x4, maew, complains of drowsiness but she is awake and alert for me, conversant HEENT:  NCAT, OP clear, neck supple Cardiovascular:  RRR, no mgr, no JVD Lungs:  Few crackles RUL, normal effort Abdomen:  BS+, soft, nontender Musculoskeletal:  Left shoulder scar well healed Skin:  No rash  LABS:  BMET  Recent Labs Lab 11/12/15 2321 11/13/15 0356 11/14/15 0321  NA 145 142 144  K 3.1* 3.3* 3.9  CL 107 104 107  CO2 29 26 29   BUN 12 12 20   CREATININE 0.72 0.75 1.11*  GLUCOSE 143* 181* 160*    Electrolytes  Recent Labs Lab 11/08/15 1122  11/12/15 2321 11/13/15 0356 11/14/15 0321  CALCIUM  --   < > 8.8* 8.4* 8.2*  MG 1.4*  --   --   --   --   PHOS 3.4  --   --   --   --   < > = values in this interval not displayed.  CBC  Recent Labs Lab 11/12/15 2321 11/13/15 0356 11/14/15 0321  WBC 10.7* 10.2 12.9*  HGB 10.2* 10.2* 9.5*  HCT 33.3* 34.5* 32.0*  PLT 471* 471* 495*    Coag's  Recent Labs Lab 11/13/15 0356  APTT 29  INR 1.19    Sepsis  Markers  Recent Labs Lab 11/08/15 0251  LATICACIDVEN 1.88    ABG No results for input(s): PHART, PCO2ART, PO2ART in the last 168 hours.  Liver Enzymes  Recent Labs Lab 11/08/15 0141 11/08/15 1122  AST 24 22  ALT 16 15  ALKPHOS 88 82  BILITOT 0.6 0.7  ALBUMIN 3.7 3.2*    Cardiac Enzymes No results for input(s): TROPONINI, PROBNP in the last 168 hours.  Glucose  Recent Labs Lab 11/09/15 0734 11/13/15 0805 11/13/15 1142 11/13/15 1645 11/13/15 2122 11/14/15 0733  GLUCAP 119* 215* 192* 168* 133* 118*    Imaging Dg Chest Port 1 View  11/13/2015  CLINICAL DATA:  Progressive dyspnea for the past month non associated with cough or chest pain; shoulder replacement several weeks ago, history of lower extremity cellulitis EXAM: PORTABLE CHEST 1 VIEW COMPARISON:  CT scan chest of Nov 13, 2015 FINDINGS: The lungs are well-expanded. The interstitial markings are coarse. There is no alveolar infiltrate. There is no pleural effusion or pneumothorax. The cardiac silhouette is enlarged. The pulmonary vascularity is mildly prominent centrally. The bony thorax exhibits no acute abnormality. 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 IMPRESSION: Cardiomegaly with mild central pulmonary vascular prominence. No alveolar pneumonia nor pulmonary edema nor  other acute cardiopulmonary abnormality. Electronically Signed   By: David  Martinique M.D.   On: 11/13/2015 10:31     STUDIES:  11/13/2015 CT chest > no evidence of PE but poorly timed contrast bolus, patchy airspace disease and ground glass in the upper lobes bilaterally worse RUL 5/9 TTE> LVEF normal, grade 1 DD, normal valves, normal RV size and function, PA pressure estimate is normal. 5/9 LE doppler > negative for DVT  CULTURES: MRSA swab negative  ANTIBIOTICS: Cefuroxime 5/8 > 5/9 Vanc 5/9 >  Cefepime 5/9 >   SIGNIFICANT EVENTS:   LINES/TUBES:   DISCUSSION: 60 y/o female with a past medical history of rheumatoid arthritis here  with dyspnea and hypoxemia of uncertain etiology.  See discussion of DDX on 5/9 note. At this point we are treating HCAP empirically.  Objectively she seems improved to me (RR better today, normal respiratory effort, O2 saturation normal), but she is still complaining of dyspnea.  Will check ABG to ensure no evidence of hypercapnea.  ASSESSMENT / PLAN:  PULMONARY A: Acute respiratory failure with hypoxemia  Pulmonary infiltrates> HCAP? History of Rheumatoid arthritis Dyspnea OSA P:   I encouraged her to use CPAP, she refuses Agree with ABG, if normal or compensated pCO2 OK for floor Follow up connective tissue disease serologies Monitor O2 saturation, give O2 to maintain O2 above > 90% Repeat CXR 5/10 Lasix today Hold IVF See ID  CARDIOVASCULAR A:  Leg swelling > at risk for DVT Grade 1 diastolic dysfunction P:  Monitor hemodynamics  INFECTIOUS A:   HCAP? P:   Continue vancomycin and cefepime Stop vancomycin tomorrow if stable  NEURO A: Drowsy> likely medication related; doubt severe hypercapnea P: ABG Hold latuda and claritin Out of bed today  FAMILY  - Updates: none bedside  Roselie Awkward, MD [facility] Pager: [phone] Cell: [phone] After 3pm or if no response, call [phone]  11/14/2015, 9:26 AM

## 2015-11-14 NOTE — Progress Notes (Addendum)
PROGRESS NOTE    Jasmine Huff  [id number] DOB: Sep 25, 1955 DOA: 11/12/2015  PCP: Lilian Coma, MD  Outpatient Specialists: [facility]  Brief Narrative:  Jasmine Huff is a 60 y.o. female from home with morbid obesity, HTN, anxiety, chronic pain, OSA noncompliant with CPAP, RSL, DM type II who presents with complaints of shortness of breath. She was discharge don 5/5 after being admitted for acute encephalopathy and left leg cellulitis. She was noted to be hypoxic on room air at rest in the 80s and it was recommended by Dr Ree Kida that she go home with O2 but she declined. She states she checked her pulse ox at home while walking yesterday and it was found to be in the 70s.   Subjective: Patient feeling sleepy this morning. She is having trouble breathing and staying awake.  She denies chest pain.  Report diarrhea has improved, less episodes.  Last night her oxygen drop when she was place on flat position.   Assessment & Plan:  1-Acute respiratory failure with hypoxia Treating for PNA and evaluation for inflammatory etiology.  - CT reveals multifocal infiltrates - no symptoms of infection-- -lethargic this am, will hold MS -contin, decrease OxyContin to PRN. Would avoid sedatives for now.  _ABG stat. I have order BIPAP.  -CCM following.  -IV antibiotics.  -ECHO; mild diastolic dysfunction.   Anxiety -on PRN xanax, careful with oversedation. Discussed with staff.   Encephalopathy, acute;  More lethargic this am.  Check ABG, BIPAP ordered. Hold sedatives.    Left leg cellulitis - started on Cefepime by PCCM-  - LLE Duplex neg  Diarrhea/ dehydration -improved, less frequent.  -hold IV fluids to avoid pulmonary edema.   Hypokalemia - due to above- replaced.   Chronic pain - Hold  MS Contin due to  Lethargic. and Oxycodone PRN  OSA -  sleep study was in 2015- settings are 4/8    Insomnia - Discontinue Ambien due to sedation.     Depression -  Wellbutrin, Lexapro, Latuda    GERD (gastroesophageal reflux disease) - Protonix    Diabetes mellitus (HCC) - SSI - A1c 7.0- discussed diabetic diet education - will need Metformin on d/c  RLS - REquip  Morbid obesity - Body mass index is 63.87 kg/(m^2).  - dietician consult for further education   DVT prophylaxis: Heparin Code Status: full cod Family Communication:  Disposition Plan: follow in SDU Consultants:   PCCM Procedures:   none Antimicrobials:  Anti-infectives    Start     Dose/Rate Route Frequency Ordered Stop   11/14/15 0000  vancomycin (VANCOCIN) 1,500 mg in sodium chloride 0.9 % 500 mL IVPB     1,500 mg 250 mL/hr over 120 Minutes Intravenous Every 12 hours 11/13/15 1006     11/13/15 2200  hydroxychloroquine (PLAQUENIL) tablet 100 mg     100 mg Oral Daily at bedtime 11/13/15 0704     11/13/15 2000  ceFEPIme (MAXIPIME) 1 g in dextrose 5 % 50 mL IVPB     1 g 100 mL/hr over 30 Minutes Intravenous Every 8 hours 11/13/15 1006     11/13/15 1130  vancomycin (VANCOCIN) 2,500 mg in sodium chloride 0.9 % 500 mL IVPB     2,500 mg 250 mL/hr over 120 Minutes Intravenous  Once 11/13/15 0954 11/13/15 1253   11/13/15 1100  ceFEPIme (MAXIPIME) 2 g in dextrose 5 % 50 mL IVPB     2 g 100 mL/hr over 30 Minutes Intravenous  Once 11/13/15  [id number] 11/13/15 1232   11/13/15 1000  hydroxychloroquine (PLAQUENIL) tablet 200 mg     200 mg Oral Daily 11/13/15 0605     11/13/15 0800  cefUROXime (CEFTIN) tablet 500 mg  Status:  Discontinued     500 mg Oral 2 times daily with meals 11/13/15 0605 11/13/15 0944       Objective: Filed Vitals:   11/14/15 0312 11/14/15 0400 11/14/15 0600 11/14/15 0800  BP:  119/55 135/80 140/56  Pulse:  89 87 97  Temp: 98 F (36.7 C)   98.8 F (37.1 C)  TempSrc: Oral   Oral  Resp:  14 10 18   Height:      Weight:      SpO2:  96% 97% 100%    Intake/Output Summary (Last 24 hours) at 11/14/15 0906 Last data filed at 11/14/15 0800  Gross per 24  hour  Intake 2838.75 ml  Output    800 ml  Net 2038.75 ml   Filed Weights   11/13/15 0825  Weight: 163.5 kg (360 lb 7.2 oz)    Examination: General exam: lethargic, wake up answer some questions. Fall sleep/  HEENT: PERRLA, oral mucosa moist, no sclera icterus or thrush Respiratory system: Clear to auscultation. Tachypnea.  Cardiovascular system: S1 & S2 heard, RRR.  No murmurs  Gastrointestinal system: Abdomen soft, non-tender, nondistended. Normal bowel sound. No organomegaly Extremities: No cyanosis, clubbing or edema Skin: No rashes or ulcers     Data Reviewed: I have personally reviewed following labs and imaging studies  CBC:  Recent Labs Lab 11/08/15 1122 11/09/15 0512 11/12/15 2321 11/13/15 0356 11/14/15 0321  WBC 10.6* 11.9* 10.7* 10.2 12.9*  NEUTROABS  --   --  7.4 8.2* 9.4*  HGB 9.6* 9.6* 10.2* 10.2* 9.5*  HCT 31.4* 30.2* 33.3* 34.5* 32.0*  MCV 80.5 79.3 80.4 81.2 82.3  PLT 422* 389 471* 471* [id number]*   Basic Metabolic Panel:  Recent Labs Lab 11/08/15 0141 11/08/15 1122 11/09/15 0512 11/12/15 2321 11/13/15 0356 11/14/15 0321  NA 140  --  141 145 142 144  K 3.8  --  3.9 3.1* 3.3* 3.9  CL 103  --  107 107 104 107  CO2 25  --  26 29 26 29   GLUCOSE 153*  --  149* 143* 181* 160*  BUN 11  --  7 12 12 20   CREATININE 0.81 0.69 0.75 0.72 0.75 1.11*  CALCIUM 8.8*  --  8.2* 8.8* 8.4* 8.2*  MG  --  1.4*  --   --   --   --   PHOS  --  3.4  --   --   --   --    GFR: Estimated Creatinine Clearance: 83.4 mL/min (by C-G formula based on Cr of 1.11). Liver Function Tests:  Recent Labs Lab 11/08/15 0141 11/08/15 1122  AST 24 22  ALT 16 15  ALKPHOS 88 82  BILITOT 0.6 0.7  PROT 7.4 6.8  ALBUMIN 3.7 3.2*   No results for input(s): LIPASE, AMYLASE in the last 168 hours.  Recent Labs Lab 11/08/15 0733  AMMONIA 17   Coagulation Profile:  Recent Labs Lab 11/13/15 0356  INR 1.19   Cardiac Enzymes: No results for input(s): CKTOTAL, CKMB,  CKMBINDEX, TROPONINI in the last 168 hours. BNP (last 3 results) No results for input(s): PROBNP in the last 8760 hours. HbA1C: No results for input(s): HGBA1C in the last 72 hours. CBG:  Recent Labs Lab 11/13/15 0805 11/13/15 1142 11/13/15 1645 11/13/15  2122 11/14/15 0733  GLUCAP 215* 192* 168* 133* 118*   Lipid Profile: No results for input(s): CHOL, HDL, LDLCALC, TRIG, CHOLHDL, LDLDIRECT in the last 72 hours. Thyroid Function Tests: No results for input(s): TSH, T4TOTAL, FREET4, T3FREE, THYROIDAB in the last 72 hours. Anemia Panel: No results for input(s): VITAMINB12, FOLATE, FERRITIN, TIBC, IRON, RETICCTPCT in the last 72 hours. Urine analysis:    Component Value Date/Time   COLORURINE AMBER* 11/13/2015 0250   APPEARANCEUR CLOUDY* 11/13/2015 0250   LABSPEC 1.040* 11/13/2015 0250   PHURINE 6.0 11/13/2015 0250   GLUCOSEU NEGATIVE 11/13/2015 0250   HGBUR NEGATIVE 11/13/2015 0250   BILIRUBINUR NEGATIVE 11/13/2015 0250   KETONESUR NEGATIVE 11/13/2015 0250   PROTEINUR 100* 11/13/2015 0250   UROBILINOGEN 0.2 06/14/2013 1048   NITRITE NEGATIVE 11/13/2015 0250   LEUKOCYTESUR NEGATIVE 11/13/2015 0250   Sepsis Labs: @LABRCNTIP (procalcitonin:4,lacticidven:4)  ) Recent Results (from the past 240 hour(s))  Urine culture     Status: None   Collection Time: 11/08/15  3:39 AM  Result Value Ref Range Status   Specimen Description URINE, CATHETERIZED  Final   Special Requests Normal  Final   Culture   Final    NO GROWTH 1 DAY Performed at [facility]    Report Status 11/09/2015 FINAL  Final  Blood Culture (routine x 2)     Status: None   Collection Time: 11/08/15  5:40 AM  Result Value Ref Range Status   Specimen Description BLOOD LEFT FOREARM  Final   Special Requests BOTTLES DRAWN AEROBIC AND ANAEROBIC 5CC  Final   Culture   Final    NO GROWTH 5 DAYS Performed at [facility]    Report Status 11/13/2015 FINAL  Final  Blood Culture (routine x 2)      Status: None   Collection Time: 11/08/15  5:46 AM  Result Value Ref Range Status   Specimen Description BLOOD LEFT HAND  Final   Special Requests BOTTLES DRAWN AEROBIC AND ANAEROBIC 5CC  Final   Culture   Final    NO GROWTH 5 DAYS Performed at [facility]    Report Status 11/13/2015 FINAL  Final  MRSA PCR Screening     Status: None   Collection Time: 11/13/15  7:00 AM  Result Value Ref Range Status   MRSA by PCR NEGATIVE NEGATIVE Final    Comment:        The GeneXpert MRSA Assay (FDA approved for NASAL specimens only), is one component of a comprehensive MRSA colonization surveillance program. It is not intended to diagnose MRSA infection nor to guide or monitor treatment for MRSA infections.          Radiology Studies: Dg Chest 2 View  11/12/2015  CLINICAL DATA:  60 year old female with shortness of breath and wheezing. EXAM: CHEST  2 VIEW COMPARISON:  Chest radiograph dated 11/08/2015 FINDINGS: There is rotated positioning of the patient. No definite consolidation noted. No pleural effusion or pneumothorax. There is cardiomegaly. 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 IMPRESSION: Limited study due to patient positioning and soft tissue attenuation. No definite acute cardiopulmonary process. Electronically Signed   By: Anner Crete M.D.   On: 11/12/2015 23:38   Ct Angio Chest Pe W/cm &/or Wo Cm  11/13/2015  CLINICAL DATA:  Dyspnea for 3 weeks. EXAM: CT ANGIOGRAPHY CHEST WITH CONTRAST TECHNIQUE: Multidetector CT imaging of the chest was performed using the standard protocol during bolus administration of intravenous contrast. Multiplanar CT image reconstructions  and MIPs were obtained to evaluate the vascular anatomy. CONTRAST:  100 mL Isovue 370 intravenous COMPARISON:  04/28/2012 FINDINGS: Cardiovascular: Suboptimal opacification of the lobar and segmental arteries. No central emboli are evident. Lungs: Multifocal patchy airspace  opacities, upper lobe predominant. Infectious infiltrate, inflammatory pneumonitis, air trapping are considerations. Central airways: Patent Effusions: None Lymphadenopathy: None Esophagus: Unremarkable Upper abdomen: No significant abnormality Musculoskeletal: No significant musculoskeletal lesion. Review of the MIP images confirms the above findings. IMPRESSION: No large central emboli. Study is limited for detection of lobar or segmental emboli. There are multifocal patchy airspace opacities which may represent air trapping, pneumonitis, infection. No effusions. Electronically Signed   By: Andreas Newport M.D.   On: 11/13/2015 05:42   Dg Chest Port 1 View  11/13/2015  CLINICAL DATA:  Progressive dyspnea for the past month non associated with cough or chest pain; shoulder replacement several weeks ago, history of lower extremity cellulitis EXAM: PORTABLE CHEST 1 VIEW COMPARISON:  CT scan chest of Nov 13, 2015 FINDINGS: The lungs are well-expanded. The interstitial markings are coarse. There is no alveolar infiltrate. There is no pleural effusion or pneumothorax. The cardiac silhouette is enlarged. The pulmonary vascularity is mildly prominent centrally. The bony thorax exhibits no acute abnormality. 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 IMPRESSION: Cardiomegaly with mild central pulmonary vascular prominence. No alveolar pneumonia nor pulmonary edema nor other acute cardiopulmonary abnormality. Electronically Signed   By: David  Martinique M.D.   On: 11/13/2015 10:31        Scheduled Meds: . buPROPion  450 mg Oral q morning - 10a  . ceFEPime (MAXIPIME) IV  1 g Intravenous Q8H  . cholecalciferol  5,000 Units Oral Daily  . escitalopram  40 mg Oral q morning - 10a  . heparin subcutaneous  5,000 Units Subcutaneous Q8H  . hydroxychloroquine  100 mg Oral QHS  . hydroxychloroquine  200 mg Oral Daily  . insulin aspart  0-9 Units Subcutaneous TID WC  . loratadine  10 mg Oral Daily  . lurasidone  120 mg  Oral Daily  . multivitamin with minerals  1 tablet Oral Daily  . nystatin cream  1 application Topical BID  . pantoprazole  40 mg Oral Daily  . rOPINIRole  3 mg Oral QHS  . vancomycin  1,500 mg Intravenous Q12H   Continuous Infusions:     LOS: 1 day    Time spent in minutes: 35 minutes.     Elmarie Shiley, MD [facility] Pager: [email or web address] Password [email or web address] 11/14/2015, 9:06 AM

## 2015-11-15 ENCOUNTER — Inpatient Hospital Stay (HOSPITAL_COMMUNITY): Payer: Medicare Other

## 2015-11-15 ENCOUNTER — Ambulatory Visit: Payer: Self-pay | Admitting: Physical Therapy

## 2015-11-15 DIAGNOSIS — G4733 Obstructive sleep apnea (adult) (pediatric): Secondary | ICD-10-CM

## 2015-11-15 LAB — BASIC METABOLIC PANEL
ANION GAP: 9 (ref 5–15)
BUN: 30 mg/dL — ABNORMAL HIGH (ref 6–20)
CHLORIDE: 105 mmol/L (ref 101–111)
CO2: 31 mmol/L (ref 22–32)
Calcium: 8.5 mg/dL — ABNORMAL LOW (ref 8.9–10.3)
Creatinine, Ser: 1.49 mg/dL — ABNORMAL HIGH (ref 0.44–1.00)
GFR calc Af Amer: 43 mL/min — ABNORMAL LOW (ref >60)
GFR, EST NON AFRICAN AMERICAN: 37 mL/min — AB (ref >60)
GLUCOSE: 129 mg/dL — AB (ref 65–99)
POTASSIUM: 4.9 mmol/L (ref 3.5–5.1)
Sodium: 145 mmol/L (ref 135–145)

## 2015-11-15 LAB — GLUCOSE, CAPILLARY
GLUCOSE-CAPILLARY: 370 mg/dL — AB (ref 65–99)
Glucose-Capillary: 255 mg/dL — ABNORMAL HIGH (ref 65–99)
Glucose-Capillary: 126 mg/dL — ABNORMAL HIGH (ref 65–99)
Glucose-Capillary: 134 mg/dL — ABNORMAL HIGH (ref 65–99)

## 2015-11-15 LAB — CBC
HEMATOCRIT: 32.4 % — AB (ref 36.0–46.0)
HEMOGLOBIN: 9.5 g/dL — AB (ref 12.0–15.0)
MCH: 24.4 pg — ABNORMAL LOW (ref 26.0–34.0)
MCHC: 29.3 g/dL — ABNORMAL LOW (ref 30.0–36.0)
MCV: 83.3 fL (ref 78.0–100.0)
PLATELETS: 468 10*3/uL — AB (ref 150–400)
RBC: 3.89 MIL/uL (ref 3.87–5.11)
RDW: 17.1 % — ABNORMAL HIGH (ref 11.5–15.5)
WBC: 11.2 10*3/uL — AB (ref 4.0–10.5)

## 2015-11-15 MED ORDER — BIOTENE DRY MOUTH MT LIQD: 15.0000 mL | OROMUCOSAL | Status: DC | PRN

## 2015-11-15 MED ORDER — MORPHINE SULFATE ER 15 MG PO TBCR
15.0000 mg | EXTENDED_RELEASE_TABLET | Freq: Two times a day (BID) | ORAL | Status: DC
  Administered 2015-11-15 – 2015-11-16 (×3): 15 mg via ORAL
  Filled 2015-11-15 (×3): qty 1

## 2015-11-15 MED ORDER — LURASIDONE HCL 80 MG PO TABS
80.0000 mg | ORAL_TABLET | Freq: Every day | ORAL | Status: DC
  Administered 2015-11-15 – 2015-11-16 (×2): 80 mg via ORAL
  Filled 2015-11-15 (×2): qty 1

## 2015-11-15 NOTE — Progress Notes (Signed)
NUTRITION NOTE  Pt seen again this AM related to low sodium diet education. Pt less drowsy this AM and is agreeable to discussion. She states that she is knowledgeable about low sodium diet although she has not had education in the past; her father was on HD and she helped him extensively with his diet which required monitoring sodium intake. Pt able to list high sodium-containing foods. Provided pt with handouts and she is agreeable to reading over than and alerting RN should she have questions so that RD may answer these for her.   Continue to monitor for additional educational needs/opportunities prior to d/c. Pt may benefit from outpatient diet counseling related to weight loss.     Jarome Matin, RD, LDN Inpatient Clinical Dietitian Pager # [phone] After hours/weekend pager # [phone]

## 2015-11-15 NOTE — Progress Notes (Signed)
PULMONARY / CRITICAL CARE MEDICINE   Name: Jasmine Huff MRN: [id number] DOB: Jul 24, 1955    ADMISSION DATE:  11/12/2015 CONSULTATION DATE:  11/12/2015   REFERRING MD:  Wynelle Cleveland  CHIEF COMPLAINT:  Dyspnea  BRIEF:  60 y/o female with rheumatoid arthritis admitted on 5/10 with dyspnea and mild hypoxemia with abnormal pulmonary infiltrates.   SUBJECTIVE:  Refused CPAP Sleepy again Breathing better Oxygenation improved Off Oxygen   VITAL SIGNS: BP 164/79 mmHg  Pulse 95  Temp(Src) 98.6 F (37 C) (Oral)  Resp 17  Ht 5\' 3"  (1.6 m)  Wt 360 lb 7.2 oz (163.5 kg)  BMI 63.87 kg/m2  SpO2 97%  HEMODYNAMICS:    VENTILATOR SETTINGS:    INTAKE / OUTPUT: I/O last 3 completed shifts: In: 3110 [I.V.:1185; Other:675; IV Piggyback:1250] Out: 1750 [Urine:1750]  PHYSICAL EXAMINATION: General:  No respiratory distress Neuro:  Awake, alert, oriented x4, MAEW HEENT:  NCAT, OP clear, neck supple Cardiovascular:  RRR, no mgr, no JVD Lungs:  CTA B, normal effort Abdomen:  BS+, soft, nontender Musculoskeletal:  Left shoulder scar well healed Skin:  No rash  LABS:  BMET  Recent Labs Lab 11/13/15 0356 11/14/15 0321 11/15/15 0307  NA 142 144 145  K 3.3* 3.9 4.9  CL 104 107 105  CO2 26 29 31   BUN 12 20 30*  CREATININE 0.75 1.11* 1.49*  GLUCOSE 181* 160* 129*    Electrolytes  Recent Labs Lab 11/08/15 1122  11/13/15 0356 11/14/15 0321 11/15/15 0307  CALCIUM  --   < > 8.4* 8.2* 8.5*  MG 1.4*  --   --   --   --   PHOS 3.4  --   --   --   --   < > = values in this interval not displayed.  CBC  Recent Labs Lab 11/13/15 0356 11/14/15 0321 11/15/15 0307  WBC 10.2 12.9* 11.2*  HGB 10.2* 9.5* 9.5*  HCT 34.5* 32.0* 32.4*  PLT 471* 495* 468*    Coag's  Recent Labs Lab 11/13/15 0356  APTT 29  INR 1.19    Sepsis Markers No results for input(s): LATICACIDVEN, PROCALCITON, O2SATVEN in the last 168 hours.  ABG No results for input(s): PHART, PCO2ART, PO2ART in the  last 168 hours.  Liver Enzymes  Recent Labs Lab 11/08/15 1122  AST 22  ALT 15  ALKPHOS 82  BILITOT 0.7  ALBUMIN 3.2*    Cardiac Enzymes No results for input(s): TROPONINI, PROBNP in the last 168 hours.  Glucose  Recent Labs Lab 11/13/15 2122 11/14/15 0733 11/14/15 1132 11/14/15 1649 11/14/15 2121 11/15/15 0824  GLUCAP 133* 118* 140* 129* 164* 255*    Imaging No results found.   STUDIES:  11/13/2015 CT chest > no evidence of PE but poorly timed contrast bolus, patchy airspace disease and ground glass in the upper lobes bilaterally worse RUL 5/9 TTE> LVEF normal, grade 1 DD, normal valves, normal RV size and function, PA pressure estimate is normal. 5/9 LE doppler > negative for DVT  CULTURES: MRSA swab negative  ANTIBIOTICS: Cefuroxime 5/8 > 5/9 Vanc 5/9 > 5/11 Cefepime 5/9 >   SIGNIFICANT EVENTS:   LINES/TUBES:   DISCUSSION: 60 y/o female with a past medical history of rheumatoid arthritis here with dyspnea and hypoxemia of uncertain etiology.  See discussion of DDX on 5/9 note. At this point we are treating HCAP empirically.  Her serum Anti CCP antibody is elevated which is expected with her RA, but it is not clear that her  lung abnormality has anything to do with her RA, specifically since she has improved with antibiotics and no anti-inflammatory treatment.     ASSESSMENT / PLAN:  PULMONARY A: Acute respiratory failure with hypoxemia > resolved Pulmonary infiltrates> HCAP? History of Rheumatoid arthritis > serum IgG to anti CCP antibody elevated, all else normal Dyspnea > improved OSA P:   I encouraged her to use CPAP, she refuses However, she is interested in restarting CPAP at home (she lost her last machine), so will order case management consult to help with this.  She reports that her sleep physician is Dr. Maxwell Caul in [place] who she will follow up with. Monitor O2 saturation, give O2 to maintain O2 above > 90% Repeat CXR 5/11 She will  need an outpatient CXR with PCP, if no improvement then f/u with pulmonary   INFECTIOUS A:   HCAP? Cellulitis P:   Continue cefepime Stop vancomycin Would discharge on Levaquin or Augmentin to complete a 7 day course  RENAL A: AKI> uncertain etiology, lasix related? Vanc related? P: Per primary team, consider urine studies, etc' Hold lasix   NEURO A: Drowsy> due to untreated OSA P: Use CPAP meds per primary team Out of bed today  FAMILY  - Updates: none bedside  Agree with transfer to floor. PCCM will sign off, please see my detailed recommendations above. Call if questions  Roselie Awkward, MD [facility] Pager: [phone] Cell: [phone] After 3pm or if no response, call [phone]  11/15/2015, 8:59 AM

## 2015-11-15 NOTE — Progress Notes (Signed)
PROGRESS NOTE    Jasmine Huff  [id number] DOB: 1955/07/11 DOA: 11/12/2015  PCP: Lilian Coma, MD  Outpatient Specialists: [facility]  Brief Narrative:  Jasmine Huff is a 60 y.o. female from home with morbid obesity, HTN, anxiety, chronic pain, OSA noncompliant with CPAP, RSL, DM type II who presents with complaints of shortness of breath. She was discharge don 5/5 after being admitted for acute encephalopathy and left leg cellulitis. She was noted to be hypoxic on room air at rest in the 80s and it was recommended by Dr Ree Kida that she go home with O2 but she declined. She states she checked her pulse ox at home while walking yesterday and it was found to be in the 70s.   Subjective: She is alert this am. Was not able to rest last night due to pain.  She is breathing better   Assessment & Plan:  1-Acute respiratory failure with hypoxia Treating for PNA and evaluation for inflammatory etiology.  - CT reveals multifocal infiltrates - no symptoms of infection-- -CCM following.  -IV antibiotics.  -ECHO; mild diastolic dysfunction.  Plan is to treat for PNA. CPAP at HS.  Received 2 doses of lasix.   AKI; likely related to diuresis.  Continue to hold ACE.  Repeat labs in am.   Anxiety -on PRN xanax, careful with oversedation. Discussed with staff.   Encephalopathy, acute;  More alert, refuse ABG.    Left leg cellulitis - started on Cefepime by PCCM-  - LLE Duplex neg  Diarrhea/ dehydration -Improved, less frequent.  -hold IV fluids to avoid pulmonary edema.   Hypokalemia -Due to above- replaced.  -Repeat Mg level.   Chronic pain - More alert today. Will resume lower dose of MS-contin and continue lower dose Oxycodone PRN  OSA -  sleep study was in 2015- settings are 4/8 -Appreciate Dr Lake Bells , he will help arranging CPAP     Insomnia - Discontinue Ambien due to sedation.     Depression - Wellbutrin, Lexapro, resume lower dose Latuda    GERD (gastroesophageal reflux disease) - Protonix    Diabetes mellitus (HCC) - SSI - A1c 7.0- discussed diabetic diet education - will need Metformin on d/c  RLS - REquip  Morbid obesity - Body mass index is 63.87 kg/(m^2).  - dietician consult for further education   DVT prophylaxis: Heparin Code Status: full code Family Communication: care discussed with patient  Disposition Plan: transfer to med-surgery  Consultants:   PCCM Procedures:   none Antimicrobials:  Anti-infectives    Start     Dose/Rate Route Frequency Ordered Stop   11/14/15 1200  vancomycin (VANCOCIN) 1,250 mg in sodium chloride 0.9 % 250 mL IVPB     1,250 mg 166.7 mL/hr over 90 Minutes Intravenous Every 12 hours 11/14/15 1015     11/14/15 0000  vancomycin (VANCOCIN) 1,500 mg in sodium chloride 0.9 % 500 mL IVPB  Status:  Discontinued     1,500 mg 250 mL/hr over 120 Minutes Intravenous Every 12 hours 11/13/15 1006 11/14/15 1015   11/13/15 2200  hydroxychloroquine (PLAQUENIL) tablet 100 mg     100 mg Oral Daily at bedtime 11/13/15 0704     11/13/15 2000  ceFEPIme (MAXIPIME) 1 g in dextrose 5 % 50 mL IVPB     1 g 100 mL/hr over 30 Minutes Intravenous Every 8 hours 11/13/15 1006     11/13/15 1130  vancomycin (VANCOCIN) 2,500 mg in sodium chloride 0.9 % 500 mL IVPB  2,500 mg 250 mL/hr over 120 Minutes Intravenous  Once 11/13/15 0954 11/13/15 1253   11/13/15 1100  ceFEPIme (MAXIPIME) 2 g in dextrose 5 % 50 mL IVPB     2 g 100 mL/hr over 30 Minutes Intravenous  Once 11/13/15 0954 11/13/15 1232   11/13/15 1000  hydroxychloroquine (PLAQUENIL) tablet 200 mg     200 mg Oral Daily 11/13/15 0605     11/13/15 0800  cefUROXime (CEFTIN) tablet 500 mg  Status:  Discontinued     500 mg Oral 2 times daily with meals 11/13/15 0605 11/13/15 0944       Objective: Filed Vitals:   11/15/15 0400 11/15/15 0500 11/15/15 0600 11/15/15 0757  BP: 155/77  153/133 164/79  Pulse: 90 91 86 95  Temp:      TempSrc:        Resp: 11 13 16 17   Height:      Weight:      SpO2: 99% 92% 99% 97%    Intake/Output Summary (Last 24 hours) at 11/15/15 0826 Last data filed at 11/15/15 0800  Gross per 24 hour  Intake   1555 ml  Output   1750 ml  Net   -195 ml   Filed Weights   11/13/15 0825  Weight: 163.5 kg (360 lb 7.2 oz)    Examination: General exam: Alert, answering questions.  HEENT: PERRLA, oral mucosa moist, no sclera icterus or thrush Respiratory system: Clear to auscultation.  Cardiovascular system: S1 & S2 heard, RRR.  No murmurs  Gastrointestinal system: Abdomen soft, non-tender, nondistended. Normal bowel sound. No organomegaly Extremities: No cyanosis, clubbing or edema Skin: No rashes or ulcers     Data Reviewed: I have personally reviewed following labs and imaging studies  CBC:  Recent Labs Lab 11/09/15 0512 11/12/15 2321 11/13/15 0356 11/14/15 0321 11/15/15 0307  WBC 11.9* 10.7* 10.2 12.9* 11.2*  NEUTROABS  --  7.4 8.2* 9.4*  --   HGB 9.6* 10.2* 10.2* 9.5* 9.5*  HCT 30.2* 33.3* 34.5* 32.0* 32.4*  MCV 79.3 80.4 81.2 82.3 83.3  PLT 389 471* 471* 495* [id number]*   Basic Metabolic Panel:  Recent Labs Lab 11/08/15 1122 11/09/15 0512 11/12/15 2321 11/13/15 0356 11/14/15 0321 11/15/15 0307  NA  --  141 145 142 144 145  K  --  3.9 3.1* 3.3* 3.9 4.9  CL  --  107 107 104 107 105  CO2  --  26 29 26 29 31   GLUCOSE  --  149* 143* 181* 160* 129*  BUN  --  7 12 12 20  30*  CREATININE 0.69 0.75 0.72 0.75 1.11* 1.49*  CALCIUM  --  8.2* 8.8* 8.4* 8.2* 8.5*  MG 1.4*  --   --   --   --   --   PHOS 3.4  --   --   --   --   --    GFR: Estimated Creatinine Clearance: 62.1 mL/min (by C-G formula based on Cr of 1.49). Liver Function Tests:  Recent Labs Lab 11/08/15 1122  AST 22  ALT 15  ALKPHOS 82  BILITOT 0.7  PROT 6.8  ALBUMIN 3.2*   No results for input(s): LIPASE, AMYLASE in the last 168 hours. No results for input(s): AMMONIA in the last 168 hours. Coagulation  Profile:  Recent Labs Lab 11/13/15 0356  INR 1.19   Cardiac Enzymes: No results for input(s): CKTOTAL, CKMB, CKMBINDEX, TROPONINI in the last 168 hours. BNP (last 3 results) No results for input(s): PROBNP  in the last 8760 hours. HbA1C: No results for input(s): HGBA1C in the last 72 hours. CBG:  Recent Labs Lab 11/13/15 2122 11/14/15 0733 11/14/15 1132 11/14/15 1649 11/14/15 2121  GLUCAP 133* 118* 140* 129* 164*   Lipid Profile: No results for input(s): CHOL, HDL, LDLCALC, TRIG, CHOLHDL, LDLDIRECT in the last 72 hours. Thyroid Function Tests: No results for input(s): TSH, T4TOTAL, FREET4, T3FREE, THYROIDAB in the last 72 hours. Anemia Panel: No results for input(s): VITAMINB12, FOLATE, FERRITIN, TIBC, IRON, RETICCTPCT in the last 72 hours. Urine analysis:    Component Value Date/Time   COLORURINE AMBER* 11/13/2015 0250   APPEARANCEUR CLOUDY* 11/13/2015 0250   LABSPEC 1.040* 11/13/2015 0250   PHURINE 6.0 11/13/2015 0250   GLUCOSEU NEGATIVE 11/13/2015 0250   HGBUR NEGATIVE 11/13/2015 0250   BILIRUBINUR NEGATIVE 11/13/2015 0250   KETONESUR NEGATIVE 11/13/2015 0250   PROTEINUR 100* 11/13/2015 0250   UROBILINOGEN 0.2 06/14/2013 1048   NITRITE NEGATIVE 11/13/2015 0250   LEUKOCYTESUR NEGATIVE 11/13/2015 0250   Sepsis Labs: @LABRCNTIP (procalcitonin:4,lacticidven:4)  ) Recent Results (from the past 240 hour(s))  Urine culture     Status: None   Collection Time: 11/08/15  3:39 AM  Result Value Ref Range Status   Specimen Description URINE, CATHETERIZED  Final   Special Requests Normal  Final   Culture   Final    NO GROWTH 1 DAY Performed at [facility]    Report Status 11/09/2015 FINAL  Final  Blood Culture (routine x 2)     Status: None   Collection Time: 11/08/15  5:40 AM  Result Value Ref Range Status   Specimen Description BLOOD LEFT FOREARM  Final   Special Requests BOTTLES DRAWN AEROBIC AND ANAEROBIC 5CC  Final   Culture   Final    NO GROWTH 5  DAYS Performed at [facility]    Report Status 11/13/2015 FINAL  Final  Blood Culture (routine x 2)     Status: None   Collection Time: 11/08/15  5:46 AM  Result Value Ref Range Status   Specimen Description BLOOD LEFT HAND  Final   Special Requests BOTTLES DRAWN AEROBIC AND ANAEROBIC 5CC  Final   Culture   Final    NO GROWTH 5 DAYS Performed at [facility]    Report Status 11/13/2015 FINAL  Final  MRSA PCR Screening     Status: None   Collection Time: 11/13/15  7:00 AM  Result Value Ref Range Status   MRSA by PCR NEGATIVE NEGATIVE Final    Comment:        The GeneXpert MRSA Assay (FDA approved for NASAL specimens only), is one component of a comprehensive MRSA colonization surveillance program. It is not intended to diagnose MRSA infection nor to guide or monitor treatment for MRSA infections.          Radiology Studies: Dg Chest Port 1 View  11/13/2015  CLINICAL DATA:  Progressive dyspnea for the past month non associated with cough or chest pain; shoulder replacement several weeks ago, history of lower extremity cellulitis EXAM: PORTABLE CHEST 1 VIEW COMPARISON:  CT scan chest of Nov 13, 2015 FINDINGS: The lungs are well-expanded. The interstitial markings are coarse. There is no alveolar infiltrate. There is no pleural effusion or pneumothorax. The cardiac silhouette is enlarged. The pulmonary vascularity is mildly prominent centrally. The bony thorax exhibits no acute abnormality. 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 IMPRESSION: Cardiomegaly with mild central pulmonary vascular prominence. No alveolar pneumonia nor pulmonary edema  nor other acute cardiopulmonary abnormality. Electronically Signed   By: David  Martinique M.D.   On: 11/13/2015 10:31        Scheduled Meds: . buPROPion  450 mg Oral q morning - 10a  . ceFEPime (MAXIPIME) IV  1 g Intravenous Q8H  . cholecalciferol  5,000 Units Oral Daily  . escitalopram  40 mg Oral q morning - 10a  .  heparin subcutaneous  5,000 Units Subcutaneous Q8H  . hydroxychloroquine  100 mg Oral QHS  . hydroxychloroquine  200 mg Oral Daily  . insulin aspart  0-9 Units Subcutaneous TID WC  . multivitamin with minerals  1 tablet Oral Daily  . nystatin cream  1 application Topical BID  . pantoprazole  40 mg Oral Daily  . rOPINIRole  3 mg Oral QHS  . vancomycin  1,250 mg Intravenous Q12H   Continuous Infusions:     LOS: 2 days    Time spent in minutes: 35 minutes.     Elmarie Shiley, MD [facility] Pager: [phone] [email or web address] Password [email or web address] 11/15/2015, 8:26 AM

## 2015-11-15 NOTE — Progress Notes (Signed)
Patient declines the use of any mask available for CPAP, and therefore will not be wearing it tonight. She explains that her noncompliance at home also stems from the inability to wear her mask. Education provided. Instructed patient to contact her home health agency (she states it is [facility]) for further recommendations such as nasal pillows. RN aware.

## 2015-11-16 LAB — BASIC METABOLIC PANEL
Anion gap: 10 (ref 5–15)
BUN: 25 mg/dL — AB (ref 6–20)
CALCIUM: 8.7 mg/dL — AB (ref 8.9–10.3)
CO2: 28 mmol/L (ref 22–32)
CREATININE: 1.14 mg/dL — AB (ref 0.44–1.00)
Chloride: 101 mmol/L (ref 101–111)
GFR calc non Af Amer: 52 mL/min — ABNORMAL LOW (ref >60)
GFR, EST AFRICAN AMERICAN: 60 mL/min — AB (ref >60)
GLUCOSE: 145 mg/dL — AB (ref 65–99)
Potassium: 4.8 mmol/L (ref 3.5–5.1)
Sodium: 139 mmol/L (ref 135–145)

## 2015-11-16 LAB — GLUCOSE, CAPILLARY
GLUCOSE-CAPILLARY: 152 mg/dL — AB (ref 65–99)
Glucose-Capillary: 171 mg/dL — ABNORMAL HIGH (ref 65–99)
Glucose-Capillary: 166 mg/dL — ABNORMAL HIGH (ref 65–99)

## 2015-11-16 LAB — MAGNESIUM: Magnesium: 1.7 mg/dL (ref 1.7–2.4)

## 2015-11-16 MED ORDER — GLIPIZIDE 5 MG PO TABS: 2.5000 mg | ORAL_TABLET | Freq: Every day | ORAL | Status: DC

## 2015-11-16 MED ORDER — OXYCODONE HCL 5 MG PO TABS: 5.0000 mg | ORAL_TABLET | Freq: Three times a day (TID) | ORAL | Status: DC | PRN

## 2015-11-16 MED ORDER — AMLODIPINE BESYLATE 5 MG PO TABS: 5.0000 mg | ORAL_TABLET | Freq: Every day | ORAL | Status: AC

## 2015-11-16 MED ORDER — MORPHINE SULFATE (PF) 2 MG/ML IV SOLN
2.0000 mg | Freq: Once | INTRAVENOUS | Status: AC
  Administered 2015-11-16: 2 mg via INTRAVENOUS
  Filled 2015-11-16: qty 1

## 2015-11-16 MED ORDER — AMLODIPINE BESYLATE 5 MG PO TABS
5.0000 mg | ORAL_TABLET | Freq: Every day | ORAL | Status: DC
  Administered 2015-11-16: 5 mg via ORAL
  Filled 2015-11-16: qty 1

## 2015-11-16 MED ORDER — MORPHINE SULFATE ER 15 MG PO TBCR: 15.0000 mg | EXTENDED_RELEASE_TABLET | Freq: Two times a day (BID) | ORAL | Status: DC

## 2015-11-16 MED ORDER — MORPHINE SULFATE (PF) 2 MG/ML IV SOLN
0.5000 mg | Freq: Once | INTRAVENOUS | Status: AC
  Administered 2015-11-16: 0.5 mg via INTRAVENOUS
  Filled 2015-11-16: qty 1

## 2015-11-16 MED ORDER — AMOXICILLIN-POT CLAVULANATE 875-125 MG PO TABS
1.0000 | ORAL_TABLET | Freq: Two times a day (BID) | ORAL | Status: DC
  Administered 2015-11-16: 1 via ORAL
  Filled 2015-11-16 (×2): qty 1

## 2015-11-16 MED ORDER — AMOXICILLIN-POT CLAVULANATE 875-125 MG PO TABS: 1.0000 | ORAL_TABLET | Freq: Two times a day (BID) | ORAL | Status: DC

## 2015-11-16 MED ORDER — LURASIDONE HCL 80 MG PO TABS: 80.0000 mg | ORAL_TABLET | Freq: Every day | ORAL | Status: DC

## 2015-11-16 NOTE — Progress Notes (Signed)
Pharmacy Antibiotic Note  Jasmine Huff is a 60 y.o. female with rheumatoid arthritis admitted on 11/12/2015 with hypoxia. Patient's currently on abx day # 4 for HCAP with vancomycin d/ced on 5/11. PCCM recommends to discharge on levaquin or augmentin to complete 7 day course  - afeb, wbc 11.2 on 5/11, scr down 1.14 (crcl~60 N)   Plan:  Continue Cefepime 1g IV q8h--> consider changing to oral abx today if appropriate  _________________________   Height: 5\' 3"  (160 cm) Weight: (!) 360 lb 7.2 oz (163.5 kg) IBW/kg (Calculated) : 52.4  Temp (24hrs), Avg:98.7 F (37.1 C), Min:97.9 F (36.6 C), Max:99.8 F (37.7 C)   Recent Labs Lab 11/12/15 2321 11/13/15 0356 11/14/15 0321 11/15/15 0307 11/16/15 0518  WBC 10.7* 10.2 12.9* 11.2*  --   CREATININE 0.72 0.75 1.11* 1.49* 1.14*    Estimated Creatinine Clearance: 81.2 mL/min (by C-G formula based on Cr of 1.14).    Allergies  Allergen Reactions  . Asenapine Anaphylaxis, Shortness Of Breath and Other (See Comments)    Lips tingle and tongue becomes numb/Akasthesia  . Saphris [Asenapine Maleate] Anaphylaxis, Shortness Of Breath and Other (See Comments)    Lips tingle and tongue becomes numb/Akasthesia  . Seroquel [Quetiapine Fumarate] Shortness Of Breath and Other (See Comments)    "out of breath"  . Iodinated Diagnostic Agents Hives    Dye used for CT scans, severe hives  . Pregabalin Rash and Other (See Comments)    Alters Mental status, flushing  Caused by lyrica  . Buprenorphine Rash   Antimicrobials this admission: 5/9 Vanc >> 5/11 5/9 Cefepime >>  Dose adjustments this admission: 5/10 decrease vanc from 1500mg  to 1250mg  q12h for SCr bump  Microbiology results: 5/9 MRSA PCR (-)   Thank you for allowing pharmacy to be a part of this patient's care.  Dia Sitter, PharmD, BCPS 11/16/2015 7:19 AM

## 2015-11-16 NOTE — Care Management Note (Signed)
Case Management Note  Patient Details  Name: Jasmine Huff MRN: [id number] Date of Birth: 1956-03-26  Subjective/Objective:  Admitted with AMS, PNA, AKI, cellulitis                  Action/Plan: Discharge planning, spoke with patient at bedside. Plans to d/c home with family today. States she has used [facility] in the past for [facility] services and feels she would benefit from continued [facility]. Requesting RN, PT, aide, may need home O2 pending saturation evaluation. Has used a CPAP in the past but says she lost hers, requesting another and is willing to pay out of pocket. Contacted [facility] for CPAP and O2 as well as referral for HH, orders pending.   Expected Discharge Date:                  Expected Discharge Plan:  [place]  In-House Referral:  NA  Discharge planning Services  CM Consult  Post Acute Care Choice:  Home Health Choice offered to:  Patient  DME Arranged:  N/A DME Agency:  NA  HH Arranged:  RN, Disease Management, PT, Nurse's Aide [place] Agency:  [place]  Status of Service:  Completed, signed off  Medicare Important Message Given:    Date Medicare IM Given:    Medicare IM give by:    Date Additional Medicare IM Given:    Additional Medicare Important Message give by:     If discussed at [place] of Stay Meetings, dates discussed:    Additional Comments:  Guadalupe Maple, RN 11/16/2015, 10:32 AM

## 2015-11-16 NOTE — Care Management Important Message (Signed)
Important Message  Patient DetailsImportant Message  Patient Details  Name: Jasmine Huff MRN: [id number] Date of Birth: 04/23/1956   Medicare Important Message Given:  Yes    Camillo Flaming 11/16/2015, 10:51 AM Name: Jasmine Huff MRN: [id number] Date of Birth: May 07, 1956   Medicare Important Message Given:  Yes    Camillo Flaming 11/16/2015, 10:50 AMImportant Message  Patient Details  Name: Jasmine Huff MRN: [id number] Date of Birth: 06-Sep-1955   Medicare Important Message Given:  Yes    Camillo Flaming 11/16/2015, 10:50 AM

## 2015-11-16 NOTE — Progress Notes (Addendum)
Patient oxygen saturation on room air at rest was 88%. Patient verbalized understanding of discharge instructions. Patient is stable at discharge.

## 2015-11-16 NOTE — Progress Notes (Signed)
Notified by floor staff that patient requests the use of CPAP via nasal mask. Spoke with patient, who is very compliant and wants to wear a CPAP nocturnally, but is not able to tolerate the nasal mask due to being a mouth breather. For now, she is using nocturnal O2 via nasal cannula.

## 2015-11-16 NOTE — Progress Notes (Signed)
SATURATION QUALIFICATIONS: (This note is used to comply with regulatory documentation for home oxygen)  Patient Saturations on Room Air at Rest = 94%  Patient Saturations on Room Air while Ambulating = 84%  Patient Saturations on 2L Liters of oxygen while Ambulating =92%  Please briefly explain why patient needs home oxygen: Pt limited with activity 2* O2 desaturation and significant SOB

## 2015-11-16 NOTE — Discharge Summary (Addendum)
Physician Discharge Summary  Jasmine Huff QHU:[id number] DOB: 04-23-1956 DOA: 11/12/2015  PCP: Lilian Coma, MD  Admit date: 11/12/2015 Discharge date: 11/16/2015  Time spent: 35 minutes  Recommendations for Outpatient Follow-up:  1. Needs repeat B-met to follow renal function.  2. Needs further titration of medication for Diabetes.  3. Follow compliance with CPAP.  4. Needs repeat chest x ray to document resolution of infiltrates.    Discharge Diagnoses:    Acute on Chronicrespiratory failure with hypoxia ([place])   HCAP (healthcare-associated pneumonia)   Chronic pain   Insomnia   Depression   GERD (gastroesophageal reflux disease)   Diabetes mellitus ([place])   Left leg cellulitis   Acute dyspnea   Drowsiness   Morbid obesity ([facility])   Discharge Condition: stable.   Diet recommendation: carb modified.    Filed Weights   11/13/15 0825  Weight: 163.5 kg (360 lb 7.2 oz)    History of present illness:  Jasmine Huff is a 60 y.o. female with medical history significant of morbid obesity, HTN, anxiety, chronic pain, OSA noncompliant with CPAP, RSL, DM type II; who presents with complaints of shortness of breath. Symptoms have been ongoing over the last 1 month and acutely worsened over the last week. She denies having any cough or cold symptoms. Associated symptoms include orthopnea. Her pulse oximetry usually stays around 92-93%. After walking O2 sats can drop as low as 80%. She was just last admitted to the hospital on 5/4 and discharged on 5/5 for metabolic encephalopathy found to have a left lower extremity cellulitis. She was initially started on Ancef and then switched over to by mouth Ceftin which she reports taking as advised. She feels like her left leg is getting somewhat better, but she notes that for the last 3 days she's had increased swelling and pain. Pain in the affected leg worsened with ambulation. She notes that she also underwent total shoulder arthroplasty back on  09/27/2015. As a result of the recent antibiotic she notes that she's had diarrhea since leaving the hospital. Denies having any chest pain, urinary frequency, fever, chills, change in weight, wheezing, blood in urine/stool, focal weakness, or loss of consciousness.  ED Course: Upon admission into the emergency department the patient was seen to be afebrile, heart rate up to 110, respirations up to 29, blood pressure to 212/114, O2 saturations seem to be as low as 60-70% on room air. Lab work revealed WBC 10.7, hemoglobin 10.2, platelets 471, potassium 3.1, calcium 8.8, glucose 143, and d-dimer elevated at 0.88. Patient was noted to have refused ABG that was ordered by the ED physician. Patient was taken to CT scanner, but was significantly dyspneic. The study was unable to be performed and the patient was brought back to the emergency room in which initial O2 sats are seen to be in the 70s and patient was placed on a NRB then BiPAP mask with improvement of O2 saturations to 100%.   Hospital Course:  Brief Narrative:  Jasmine Huff is a 60 y.o. female from home with morbid obesity, HTN, anxiety, chronic pain, OSA noncompliant with CPAP, RSL, DM type II who presents with complaints of shortness of breath. She was discharge don 5/5 after being admitted for acute encephalopathy and left leg cellulitis. She was noted to be hypoxic on room air at rest in the 80s and it was recommended by Dr Ree Kida that she go home with O2 but she declined. She states she checked her pulse ox at home while walking yesterday  and it was found to be in the 70s.   Subjective: Alert, denies dyspnea.   Assessment & Plan:  1-Acute respiratory failure with hypoxia Treating for PNA and evaluation for inflammatory etiology.  - CT reveals multifocal infiltrates - no symptoms of infection-- -CCM following.  -IV antibiotics. transition to Augmentin for total 7 days treatment.  -ECHO; mild diastolic dysfunction.  Plan is to treat  for PNA. CPAP at HS.  Received 2 doses of lasix.  Need home oxygen, will arrange.   AKI; likely related to diuresis.  Continue to hold ACE.  Renal function stable.   Anxiety -on PRN xanax, careful with oversedation. Discussed with staff.   Encephalopathy, acute;  More alert, refuse ABG.  Pain medication decreased.   Left leg cellulitis - started on Cefepime by PCCM- resolved.  - LLE Duplex neg  Diarrhea/ dehydration -Improved, less frequent.  -hold IV fluids to avoid pulmonary edema.  Resolved.   Hypokalemia -Due to above- replaced.  -l. Mg at 1.7   Chronic pain - More alert today. Will resume lower dose of MS-contin and continue lower dose Oxycodone PRN  OSA - sleep study was in 2015- settings are 4/8 -Appreciate Dr Lake Bells , he will help arranging CPAP    Insomnia - Discontinue Ambien due to sedation.    Depression - Wellbutrin, Lexapro, resume lower dose Latuda   GERD (gastroesophageal reflux disease) - Protonix   Diabetes mellitus ([facility]) - SSI - A1c 7.0- discussed diabetic diet education - will provide glipizide  At discharge, wont discharge on metformin due to AKI during this admission.   RLS - REquip  Morbid obesity - Body mass index is 63.87 kg/(m^2).  - dietician consult for further education    Procedures:  none  Consultations:  Dr Lake Bells  Discharge Exam: Filed Vitals:   11/16/15 1230 11/16/15 1500  BP: 144/83 164/72  Pulse:  86  Temp:  98.5 F (36.9 C)  Resp:      General: NAD Cardiovascular: S 1, S 2 RRR Respiratory: CTA  Discharge Instructions   Discharge Instructions    Diet - low sodium heart healthy    Complete by:  As directed      Increase activity slowly    Complete by:  As directed           Current Discharge Medication List    START taking these medications   Details  amLODipine (NORVASC) 5 MG tablet Take 1 tablet (5 mg total) by mouth daily. Qty: 30 tablet, Refills: 0     amoxicillin-clavulanate (AUGMENTIN) 875-125 MG tablet Take 1 tablet by mouth every 12 (twelve) hours. Qty: 6 tablet, Refills: 0    glipiZIDE (GLUCOTROL) 5 MG tablet Take 0.5 tablets (2.5 mg total) by mouth daily before breakfast. Qty: 30 tablet, Refills: 0      CONTINUE these medications which have CHANGED   Details  lurasidone (LATUDA) 80 MG TABS tablet Take 1 tablet (80 mg total) by mouth daily. Qty: 30 tablet, Refills: 0    morphine (MS CONTIN) 15 MG 12 hr tablet Take 1 tablet (15 mg total) by mouth 2 (two) times daily. Qty: 30 tablet, Refills: 0    oxyCODONE (OXY IR/ROXICODONE) 5 MG immediate release tablet Take 1 tablet (5 mg total) by mouth every 8 (eight) hours as needed for moderate pain. Qty: 30 tablet, Refills: 0      CONTINUE these medications which have NOT CHANGED   Details  acetaminophen (TYLENOL) 500 MG tablet Take 1,000 mg by  mouth every 6 (six) hours as needed for moderate pain.     ALPRAZolam (XANAX) 0.5 MG tablet Take 0.5 mg by mouth daily as needed for anxiety.    buPROPion (WELLBUTRIN XL) 150 MG 24 hr tablet Take 450 mg by mouth every morning.    cetirizine (ZYRTEC) 10 MG tablet Take 10 mg by mouth every evening.     Cholecalciferol (VITAMIN D3) 5000 units TABS Take 5,000 Units by mouth daily.    escitalopram (LEXAPRO) 20 MG tablet Take 40 mg by mouth every morning.     hydroxychloroquine (PLAQUENIL) 200 MG tablet Take 100-200 mg by mouth 2 (two) times daily. '200mg'$  in the morning, '100mg'$  in the evening    Multiple Vitamin (MULTIVITAMIN WITH MINERALS) TABS tablet Take 1 tablet by mouth daily.    nystatin cream (MYCOSTATIN) Apply 1 application topically 2 (two) times daily. Uses for yeast infection    omeprazole (PRILOSEC) 40 MG capsule Take 40 mg by mouth every evening.     rOPINIRole (REQUIP) 3 MG tablet Take 3 mg by mouth at bedtime.      STOP taking these medications     cefUROXime (CEFTIN) 500 MG tablet      hydrOXYzine (ATARAX/VISTARIL) 25 MG  tablet      losartan (COZAAR) 100 MG tablet      naproxen sodium (ANAPROX) 220 MG tablet      zolpidem (AMBIEN) 10 MG tablet        Allergies  Allergen Reactions  . Asenapine Anaphylaxis, Shortness Of Breath and Other (See Comments)    Lips tingle and tongue becomes numb/Akasthesia  . Saphris [Asenapine Maleate] Anaphylaxis, Shortness Of Breath and Other (See Comments)    Lips tingle and tongue becomes numb/Akasthesia  . Seroquel [Quetiapine Fumarate] Shortness Of Breath and Other (See Comments)    "out of breath"  . Iodinated Diagnostic Agents Hives    Dye used for CT scans, severe hives  . Pregabalin Rash and Other (See Comments)    Alters Mental status, flushing  Caused by lyrica  . Buprenorphine Rash   Follow-up Information    Follow up with [facility].   Why:  nurse, physical therapy, aide   Contact information:   [address] [place] [address] [phone]       Follow up with [facility].   Why:  CPAP (patient to purchase and pick up at store)   Contact information:   [address] [place] [address] [phone]       Follow up with Lilian Coma, MD In 1 week.   Specialty:  Family Medicine   Contact information:   [address] [place] [address] [phone]       Follow up with Simonne Maffucci, MD In 2 weeks.   Specialty:  Pulmonary Disease   Contact information:   [place] [address] [phone]        The results of significant diagnostics from this hospitalization (including imaging, microbiology, ancillary and laboratory) are listed below for reference.    Significant Diagnostic Studies: Dg Chest 2 View  11/15/2015  CLINICAL DATA:  Shortness of breath EXAM: CHEST  2 VIEW COMPARISON:  11/13/2015 FINDINGS: Increased interstitial markings without frank interstitial edema. No pleural effusion or pneumothorax. Cardiomegaly. Left shoulder  arthroplasty. Degenerative changes of the right shoulder. Thoracic spine is within normal limits. IMPRESSION: Cardiomegaly. Increased interstitial markings without frank interstitial edema. Electronically Signed   By:  Julian Hy M.D.   On: 11/15/2015 10:36   Dg Chest 2 View  11/12/2015  CLINICAL DATA:  60 year old female with shortness of breath and wheezing. EXAM: CHEST  2 VIEW COMPARISON:  Chest radiograph dated 11/08/2015 FINDINGS: There is rotated positioning of the patient. No definite consolidation noted. No pleural effusion or pneumothorax. There is cardiomegaly. 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 IMPRESSION: Limited study due to patient positioning and soft tissue attenuation. No definite acute cardiopulmonary process. Electronically Signed   By: Anner Crete M.D.   On: 11/12/2015 23:38   Ct Head Wo Contrast  11/08/2015  CLINICAL DATA:  60 year old female with altered mental status, possible fall EXAM: CT HEAD WITHOUT CONTRAST TECHNIQUE: Contiguous axial images were obtained from the base of the skull through the vertex without intravenous contrast. COMPARISON:  None. FINDINGS: Negative for acute intracranial hemorrhage, acute infarction, mass, mass effect, hydrocephalus or midline shift. Gray-white differentiation is preserved throughout. No acute soft tissue or calvarial abnormality. The globes and orbits are symmetric and unremarkable. Normal aeration of the mastoid air cells and visualized paranasal sinuses. IMPRESSION: Negative head CT. Electronically Signed   By: Jacqulynn Cadet M.D.   On: 11/08/2015 08:20   Ct Angio Chest Pe W/cm &/or Wo Cm  11/13/2015  CLINICAL DATA:  Dyspnea for 3 weeks. EXAM: CT ANGIOGRAPHY CHEST WITH CONTRAST TECHNIQUE: Multidetector CT imaging of the chest was performed using the standard protocol during bolus administration of intravenous contrast. Multiplanar CT image reconstructions and MIPs were obtained to evaluate  the vascular anatomy. CONTRAST:  100 mL Isovue 370 intravenous COMPARISON:  04/28/2012 FINDINGS: Cardiovascular: Suboptimal opacification of the lobar and segmental arteries. No central emboli are evident. Lungs: Multifocal patchy airspace opacities, upper lobe predominant. Infectious infiltrate, inflammatory pneumonitis, air trapping are considerations. Central airways: Patent Effusions: None Lymphadenopathy: None Esophagus: Unremarkable Upper abdomen: No significant abnormality Musculoskeletal: No significant musculoskeletal lesion. Review of the MIP images confirms the above findings. IMPRESSION: No large central emboli. Study is limited for detection of lobar or segmental emboli. There are multifocal patchy airspace opacities which may represent air trapping, pneumonitis, infection. No effusions. Electronically Signed   By: Andreas Newport M.D.   On: 11/13/2015 05:42   Dg Chest Port 1 View  11/13/2015  CLINICAL DATA:  Progressive dyspnea for the past month non associated with cough or chest pain; shoulder replacement several weeks ago, history of lower extremity cellulitis EXAM: PORTABLE CHEST 1 VIEW COMPARISON:  CT scan chest of Nov 13, 2015 FINDINGS: The lungs are well-expanded. The interstitial markings are coarse. There is no alveolar infiltrate. There is no pleural effusion or pneumothorax. The cardiac silhouette is enlarged. The pulmonary vascularity is mildly prominent centrally. The bony thorax exhibits no acute abnormality. 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 IMPRESSION: Cardiomegaly with mild central pulmonary vascular prominence. No alveolar pneumonia nor pulmonary edema nor other acute cardiopulmonary abnormality. Electronically Signed   By: David  Martinique M.D.   On: 11/13/2015 10:31   Dg Chest Port 1 View  11/08/2015  CLINICAL DATA:  Dyspnea and wheezing EXAM: PORTABLE CHEST 1 VIEW COMPARISON:  09/17/2015 FINDINGS: Rotated positioning and superimposed soft tissues limit the exam. No dense  consolidation. No large effusion. No pneumothorax. IMPRESSION: Limited study, negative for dense consolidation or large effusion. No pneumothorax. Electronically Signed   By: Andreas Newport M.D.   On: 11/08/2015 02:50    Microbiology: Recent Results (from the past 240 hour(s))  Urine culture  Status: None   Collection Time: 11/08/15  3:39 AM  Result Value Ref Range Status   Specimen Description URINE, CATHETERIZED  Final   Special Requests Normal  Final   Culture   Final    NO GROWTH 1 DAY Performed at [facility]    Report Status 11/09/2015 FINAL  Final  Blood Culture (routine x 2)     Status: None   Collection Time: 11/08/15  5:40 AM  Result Value Ref Range Status   Specimen Description BLOOD LEFT FOREARM  Final   Special Requests BOTTLES DRAWN AEROBIC AND ANAEROBIC 5CC  Final   Culture   Final    NO GROWTH 5 DAYS Performed at [facility]    Report Status 11/13/2015 FINAL  Final  Blood Culture (routine x 2)     Status: None   Collection Time: 11/08/15  5:46 AM  Result Value Ref Range Status   Specimen Description BLOOD LEFT HAND  Final   Special Requests BOTTLES DRAWN AEROBIC AND ANAEROBIC 5CC  Final   Culture   Final    NO GROWTH 5 DAYS Performed at [facility]    Report Status 11/13/2015 FINAL  Final  MRSA PCR Screening     Status: None   Collection Time: 11/13/15  7:00 AM  Result Value Ref Range Status   MRSA by PCR NEGATIVE NEGATIVE Final    Comment:        The GeneXpert MRSA Assay (FDA approved for NASAL specimens only), is one component of a comprehensive MRSA colonization surveillance program. It is not intended to diagnose MRSA infection nor to guide or monitor treatment for MRSA infections.      Labs: Basic Metabolic Panel:  Recent Labs Lab 11/12/15 2321 11/13/15 0356 11/14/15 0321 11/15/15 0307 11/16/15 0518  NA 145 142 144 145 139  K 3.1* 3.3* 3.9 4.9 4.8  CL 107 104 107 105 101  CO2 29 26 29 31 28   GLUCOSE  143* 181* 160* 129* 145*  BUN 12 12 20  30* 25*  CREATININE 0.72 0.75 1.11* 1.49* 1.14*  CALCIUM 8.8* 8.4* 8.2* 8.5* 8.7*  MG  --   --   --   --  1.7   Liver Function Tests: No results for input(s): AST, ALT, ALKPHOS, BILITOT, PROT, ALBUMIN in the last 168 hours. No results for input(s): LIPASE, AMYLASE in the last 168 hours. No results for input(s): AMMONIA in the last 168 hours. CBC:  Recent Labs Lab 11/12/15 2321 11/13/15 0356 11/14/15 0321 11/15/15 0307  WBC 10.7* 10.2 12.9* 11.2*  NEUTROABS 7.4 8.2* 9.4*  --   HGB 10.2* 10.2* 9.5* 9.5*  HCT 33.3* 34.5* 32.0* 32.4*  MCV 80.4 81.2 82.3 83.3  PLT 471* 471* 495* 468*   Cardiac Enzymes: No results for input(s): CKTOTAL, CKMB, CKMBINDEX, TROPONINI in the last 168 hours. BNP: BNP (last 3 results)  Recent Labs  11/12/15 2321  BNP 116.7*    ProBNP (last 3 results) No results for input(s): PROBNP in the last 8760 hours.  CBG:  Recent Labs Lab 11/15/15 1557 11/15/15 2033 11/16/15 0743 11/16/15 1136 11/16/15 1652  GLUCAP 134* 370* 171* 166* 152*       Signed:  Niel Hummer A MD.  [facility] 11/16/2015, 5:06 PM

## 2015-11-16 NOTE — Evaluation (Signed)
Physical Therapy Evaluation Patient Details Name: Jasmine Huff MRN: [id number] DOB: 07-28-1955 Today's Date: 11/16/2015   History of Present Illness  60 yo female admitted with acute respiratory failure with hypoxemia.Marland Kitchen Hx of anxiety, depression, HTN, chronic pain syndrome, fibromyalgia, RLS, obesity  Clinical Impression  Pt admitted as above and presenting with functional mobility limitations 2* obesity, knee and back pain, SOB with exertion (please see O2 sats in previous note), ltd endurance and balance deficits.  If agreeable, pt would benefit from follow up HHPT to maximize IND and safety.    Follow Up Recommendations Home health PT    Equipment Recommendations  None recommended by PT    Recommendations for Other Services       Precautions / Restrictions Precautions Precautions: Fall Restrictions Weight Bearing Restrictions: No Other Position/Activity Restrictions: WBAT      Mobility  Bed Mobility Overal bed mobility: Modified Independent Bed Mobility: Supine to Sit     Supine to sit: Modified independent (Device/Increase time)     General bed mobility comments: Pt to EOB without assist but with increased time and effort, HOB elevated and use of bed rail  Transfers Overall transfer level: Needs assistance Equipment used: Rolling walker (2 wheeled) Transfers: Sit to/from Stand Sit to Stand: Min guard         General transfer comment: close guard for safety  Ambulation/Gait Ambulation/Gait assistance: Min guard Ambulation Distance (Feet): 30 Feet (x 3) Assistive device: Rolling walker (2 wheeled) Gait Pattern/deviations: Step-through pattern;Decreased step length - right;Decreased step length - left;Shuffle;Antalgic;Wide base of support;Trunk flexed Gait velocity: decr Gait velocity interpretation: Below normal speed for age/gender General Gait Details: close guard for safety. dyspnea 3/4. fatigues easily with short distance.  See O2 sats in previous entry.    Stairs            Wheelchair Mobility    Modified Rankin (Stroke Patients Only)       Balance Overall balance assessment: Needs assistance Sitting-balance support: No upper extremity supported;Feet supported Sitting balance-Leahy Scale: Good     Standing balance support: Bilateral upper extremity supported Standing balance-Leahy Scale: Fair                               Pertinent Vitals/Pain Pain Assessment: 0-10 Pain Score: 7  Pain Location: bil knees Pain Descriptors / Indicators: Aching;Sore Pain Intervention(s): Limited activity within patient's tolerance;Monitored during session;Patient requesting pain meds-RN notified;RN gave pain meds during session    Home Living Family/patient expects to be discharged to:: Private residence Living Arrangements: Other relatives (brother) Available Help at Discharge: Family;Available 24 hours/day Type of Home: House Home Access: Stairs to enter Entrance Stairs-Rails: None Entrance Stairs-Number of Steps: 1 Home Layout: One level Home Equipment: Shower seat;Hand held shower head;Grab bars - tub/shower;Walker - 4 wheels      Prior Function Level of Independence: Independent with assistive device(s)         Comments: used walker     Hand Dominance   Dominant Hand: Right    Extremity/Trunk Assessment   Upper Extremity Assessment: Generalized weakness           Lower Extremity Assessment: Generalized weakness      Cervical / Trunk Assessment: Normal  Communication   Communication: No difficulties  Cognition Arousal/Alertness: Awake/alert Behavior During Therapy: WFL for tasks assessed/performed Overall Cognitive Status: Within Functional Limits for tasks assessed  General Comments      Exercises        Assessment/Plan    PT Assessment Patient needs continued PT services  PT Diagnosis Difficulty walking;Generalized weakness   PT Problem List  Decreased strength;Decreased activity tolerance;Decreased balance;Decreased mobility;Obesity  PT Treatment Interventions Gait training;Functional mobility training;Therapeutic activities;Patient/family education;Therapeutic exercise   PT Goals (Current goals can be found in the Care Plan section) Acute Rehab PT Goals Patient Stated Goal: home today PT Goal Formulation: With patient Time For Goal Achievement: 11/23/15 Potential to Achieve Goals: Fair    Frequency Min 3X/week   Barriers to discharge        Co-evaluation               End of Session   Activity Tolerance: Patient limited by fatigue Patient left: in chair;with call bell/phone within reach;with nursing/sitter in room Nurse Communication: Mobility status         Time: 1114-1130 PT Time Calculation (min) (ACUTE ONLY): 16 min   Charges:   PT Evaluation $PT Eval Low Complexity: 1 Procedure     PT G Codes:        Thressa Shiffer 11-30-2015, 12:32 PM

## 2015-11-17 DIAGNOSIS — M797 Fibromyalgia: Secondary | ICD-10-CM | POA: Diagnosis not present

## 2015-11-17 DIAGNOSIS — J189 Pneumonia, unspecified organism: Secondary | ICD-10-CM | POA: Diagnosis not present

## 2015-11-17 DIAGNOSIS — Z9981 Dependence on supplemental oxygen: Secondary | ICD-10-CM | POA: Diagnosis not present

## 2015-11-17 DIAGNOSIS — E119 Type 2 diabetes mellitus without complications: Secondary | ICD-10-CM | POA: Diagnosis not present

## 2015-11-17 DIAGNOSIS — Z7984 Long term (current) use of oral hypoglycemic drugs: Secondary | ICD-10-CM | POA: Diagnosis not present

## 2015-11-17 DIAGNOSIS — L03116 Cellulitis of left lower limb: Secondary | ICD-10-CM | POA: Diagnosis not present

## 2015-11-17 DIAGNOSIS — M25562 Pain in left knee: Secondary | ICD-10-CM | POA: Diagnosis not present

## 2015-11-17 DIAGNOSIS — M25561 Pain in right knee: Secondary | ICD-10-CM | POA: Diagnosis not present

## 2015-11-17 DIAGNOSIS — G4733 Obstructive sleep apnea (adult) (pediatric): Secondary | ICD-10-CM | POA: Diagnosis not present

## 2015-11-17 DIAGNOSIS — E669 Obesity, unspecified: Secondary | ICD-10-CM | POA: Diagnosis not present

## 2015-11-17 DIAGNOSIS — G2581 Restless legs syndrome: Secondary | ICD-10-CM | POA: Diagnosis not present

## 2015-11-17 DIAGNOSIS — G894 Chronic pain syndrome: Secondary | ICD-10-CM | POA: Diagnosis not present

## 2015-11-17 DIAGNOSIS — M549 Dorsalgia, unspecified: Secondary | ICD-10-CM | POA: Diagnosis not present

## 2015-11-17 DIAGNOSIS — Z6841 Body Mass Index (BMI) 40.0 and over, adult: Secondary | ICD-10-CM | POA: Diagnosis not present

## 2015-11-20 DIAGNOSIS — M797 Fibromyalgia: Secondary | ICD-10-CM | POA: Diagnosis not present

## 2015-11-20 DIAGNOSIS — J189 Pneumonia, unspecified organism: Secondary | ICD-10-CM | POA: Diagnosis not present

## 2015-11-20 DIAGNOSIS — G894 Chronic pain syndrome: Secondary | ICD-10-CM | POA: Diagnosis not present

## 2015-11-20 DIAGNOSIS — G4733 Obstructive sleep apnea (adult) (pediatric): Secondary | ICD-10-CM | POA: Diagnosis not present

## 2015-11-20 DIAGNOSIS — E119 Type 2 diabetes mellitus without complications: Secondary | ICD-10-CM | POA: Diagnosis not present

## 2015-11-20 DIAGNOSIS — L03116 Cellulitis of left lower limb: Secondary | ICD-10-CM | POA: Diagnosis not present

## 2015-11-22 DIAGNOSIS — M797 Fibromyalgia: Secondary | ICD-10-CM | POA: Diagnosis not present

## 2015-11-22 DIAGNOSIS — G894 Chronic pain syndrome: Secondary | ICD-10-CM | POA: Diagnosis not present

## 2015-11-22 DIAGNOSIS — L03116 Cellulitis of left lower limb: Secondary | ICD-10-CM | POA: Diagnosis not present

## 2015-11-22 DIAGNOSIS — J189 Pneumonia, unspecified organism: Secondary | ICD-10-CM | POA: Diagnosis not present

## 2015-11-22 DIAGNOSIS — G4733 Obstructive sleep apnea (adult) (pediatric): Secondary | ICD-10-CM | POA: Diagnosis not present

## 2015-11-22 DIAGNOSIS — E119 Type 2 diabetes mellitus without complications: Secondary | ICD-10-CM | POA: Diagnosis not present

## 2015-11-26 DIAGNOSIS — G4733 Obstructive sleep apnea (adult) (pediatric): Secondary | ICD-10-CM | POA: Diagnosis not present

## 2015-11-26 DIAGNOSIS — N179 Acute kidney failure, unspecified: Secondary | ICD-10-CM | POA: Diagnosis not present

## 2015-11-26 DIAGNOSIS — J9621 Acute and chronic respiratory failure with hypoxia: Secondary | ICD-10-CM | POA: Diagnosis not present

## 2015-11-26 DIAGNOSIS — J189 Pneumonia, unspecified organism: Secondary | ICD-10-CM | POA: Diagnosis not present

## 2015-11-26 DIAGNOSIS — D539 Nutritional anemia, unspecified: Secondary | ICD-10-CM | POA: Diagnosis not present

## 2015-11-26 DIAGNOSIS — Z79899 Other long term (current) drug therapy: Secondary | ICD-10-CM | POA: Diagnosis not present

## 2015-11-26 DIAGNOSIS — D649 Anemia, unspecified: Secondary | ICD-10-CM | POA: Diagnosis not present

## 2015-11-26 DIAGNOSIS — I5032 Chronic diastolic (congestive) heart failure: Secondary | ICD-10-CM | POA: Diagnosis not present

## 2015-11-26 DIAGNOSIS — L0390 Cellulitis, unspecified: Secondary | ICD-10-CM | POA: Diagnosis not present

## 2015-11-27 DIAGNOSIS — E119 Type 2 diabetes mellitus without complications: Secondary | ICD-10-CM | POA: Diagnosis not present

## 2015-11-27 DIAGNOSIS — G894 Chronic pain syndrome: Secondary | ICD-10-CM | POA: Diagnosis not present

## 2015-11-27 DIAGNOSIS — L03116 Cellulitis of left lower limb: Secondary | ICD-10-CM | POA: Diagnosis not present

## 2015-11-27 DIAGNOSIS — G4733 Obstructive sleep apnea (adult) (pediatric): Secondary | ICD-10-CM | POA: Diagnosis not present

## 2015-11-27 DIAGNOSIS — J189 Pneumonia, unspecified organism: Secondary | ICD-10-CM | POA: Diagnosis not present

## 2015-11-27 DIAGNOSIS — M797 Fibromyalgia: Secondary | ICD-10-CM | POA: Diagnosis not present

## 2015-11-29 DIAGNOSIS — E119 Type 2 diabetes mellitus without complications: Secondary | ICD-10-CM | POA: Diagnosis not present

## 2015-11-29 DIAGNOSIS — L03116 Cellulitis of left lower limb: Secondary | ICD-10-CM | POA: Diagnosis not present

## 2015-11-29 DIAGNOSIS — G894 Chronic pain syndrome: Secondary | ICD-10-CM | POA: Diagnosis not present

## 2015-11-29 DIAGNOSIS — M797 Fibromyalgia: Secondary | ICD-10-CM | POA: Diagnosis not present

## 2015-11-29 DIAGNOSIS — G4733 Obstructive sleep apnea (adult) (pediatric): Secondary | ICD-10-CM | POA: Diagnosis not present

## 2015-11-29 DIAGNOSIS — J189 Pneumonia, unspecified organism: Secondary | ICD-10-CM | POA: Diagnosis not present

## 2015-11-30 DIAGNOSIS — L03116 Cellulitis of left lower limb: Secondary | ICD-10-CM | POA: Diagnosis not present

## 2015-11-30 DIAGNOSIS — G894 Chronic pain syndrome: Secondary | ICD-10-CM | POA: Diagnosis not present

## 2015-11-30 DIAGNOSIS — G4733 Obstructive sleep apnea (adult) (pediatric): Secondary | ICD-10-CM | POA: Diagnosis not present

## 2015-11-30 DIAGNOSIS — M797 Fibromyalgia: Secondary | ICD-10-CM | POA: Diagnosis not present

## 2015-11-30 DIAGNOSIS — J189 Pneumonia, unspecified organism: Secondary | ICD-10-CM | POA: Diagnosis not present

## 2015-11-30 DIAGNOSIS — E119 Type 2 diabetes mellitus without complications: Secondary | ICD-10-CM | POA: Diagnosis not present

## 2015-12-04 DIAGNOSIS — L03116 Cellulitis of left lower limb: Secondary | ICD-10-CM | POA: Diagnosis not present

## 2015-12-04 DIAGNOSIS — G4733 Obstructive sleep apnea (adult) (pediatric): Secondary | ICD-10-CM | POA: Diagnosis not present

## 2015-12-04 DIAGNOSIS — E119 Type 2 diabetes mellitus without complications: Secondary | ICD-10-CM | POA: Diagnosis not present

## 2015-12-04 DIAGNOSIS — G894 Chronic pain syndrome: Secondary | ICD-10-CM | POA: Diagnosis not present

## 2015-12-04 DIAGNOSIS — M797 Fibromyalgia: Secondary | ICD-10-CM | POA: Diagnosis not present

## 2015-12-04 DIAGNOSIS — J189 Pneumonia, unspecified organism: Secondary | ICD-10-CM | POA: Diagnosis not present

## 2015-12-05 DIAGNOSIS — G894 Chronic pain syndrome: Secondary | ICD-10-CM | POA: Diagnosis not present

## 2015-12-05 DIAGNOSIS — M1710 Unilateral primary osteoarthritis, unspecified knee: Secondary | ICD-10-CM | POA: Diagnosis not present

## 2015-12-06 DIAGNOSIS — L03116 Cellulitis of left lower limb: Secondary | ICD-10-CM | POA: Diagnosis not present

## 2015-12-06 DIAGNOSIS — M797 Fibromyalgia: Secondary | ICD-10-CM | POA: Diagnosis not present

## 2015-12-06 DIAGNOSIS — J189 Pneumonia, unspecified organism: Secondary | ICD-10-CM | POA: Diagnosis not present

## 2015-12-06 DIAGNOSIS — G4733 Obstructive sleep apnea (adult) (pediatric): Secondary | ICD-10-CM | POA: Diagnosis not present

## 2015-12-06 DIAGNOSIS — E119 Type 2 diabetes mellitus without complications: Secondary | ICD-10-CM | POA: Diagnosis not present

## 2015-12-06 DIAGNOSIS — G894 Chronic pain syndrome: Secondary | ICD-10-CM | POA: Diagnosis not present

## 2015-12-08 DIAGNOSIS — M19012 Primary osteoarthritis, left shoulder: Secondary | ICD-10-CM | POA: Diagnosis not present

## 2015-12-10 DIAGNOSIS — G4733 Obstructive sleep apnea (adult) (pediatric): Secondary | ICD-10-CM | POA: Diagnosis not present

## 2015-12-10 DIAGNOSIS — L03116 Cellulitis of left lower limb: Secondary | ICD-10-CM | POA: Diagnosis not present

## 2015-12-10 DIAGNOSIS — M797 Fibromyalgia: Secondary | ICD-10-CM | POA: Diagnosis not present

## 2015-12-10 DIAGNOSIS — J189 Pneumonia, unspecified organism: Secondary | ICD-10-CM | POA: Diagnosis not present

## 2015-12-10 DIAGNOSIS — E119 Type 2 diabetes mellitus without complications: Secondary | ICD-10-CM | POA: Diagnosis not present

## 2015-12-10 DIAGNOSIS — G894 Chronic pain syndrome: Secondary | ICD-10-CM | POA: Diagnosis not present

## 2015-12-11 DIAGNOSIS — Z1211 Encounter for screening for malignant neoplasm of colon: Secondary | ICD-10-CM | POA: Diagnosis not present

## 2015-12-12 DIAGNOSIS — G4733 Obstructive sleep apnea (adult) (pediatric): Secondary | ICD-10-CM | POA: Diagnosis not present

## 2015-12-12 DIAGNOSIS — L03116 Cellulitis of left lower limb: Secondary | ICD-10-CM | POA: Diagnosis not present

## 2015-12-12 DIAGNOSIS — G894 Chronic pain syndrome: Secondary | ICD-10-CM | POA: Diagnosis not present

## 2015-12-12 DIAGNOSIS — E119 Type 2 diabetes mellitus without complications: Secondary | ICD-10-CM | POA: Diagnosis not present

## 2015-12-12 DIAGNOSIS — J189 Pneumonia, unspecified organism: Secondary | ICD-10-CM | POA: Diagnosis not present

## 2015-12-12 DIAGNOSIS — M797 Fibromyalgia: Secondary | ICD-10-CM | POA: Diagnosis not present

## 2015-12-13 DIAGNOSIS — G894 Chronic pain syndrome: Secondary | ICD-10-CM | POA: Diagnosis not present

## 2015-12-13 DIAGNOSIS — G4733 Obstructive sleep apnea (adult) (pediatric): Secondary | ICD-10-CM | POA: Diagnosis not present

## 2015-12-13 DIAGNOSIS — E119 Type 2 diabetes mellitus without complications: Secondary | ICD-10-CM | POA: Diagnosis not present

## 2015-12-13 DIAGNOSIS — L03116 Cellulitis of left lower limb: Secondary | ICD-10-CM | POA: Diagnosis not present

## 2015-12-13 DIAGNOSIS — M797 Fibromyalgia: Secondary | ICD-10-CM | POA: Diagnosis not present

## 2015-12-13 DIAGNOSIS — J189 Pneumonia, unspecified organism: Secondary | ICD-10-CM | POA: Diagnosis not present

## 2015-12-18 DIAGNOSIS — G894 Chronic pain syndrome: Secondary | ICD-10-CM | POA: Diagnosis not present

## 2015-12-18 DIAGNOSIS — L03116 Cellulitis of left lower limb: Secondary | ICD-10-CM | POA: Diagnosis not present

## 2015-12-18 DIAGNOSIS — G4733 Obstructive sleep apnea (adult) (pediatric): Secondary | ICD-10-CM | POA: Diagnosis not present

## 2015-12-18 DIAGNOSIS — E119 Type 2 diabetes mellitus without complications: Secondary | ICD-10-CM | POA: Diagnosis not present

## 2015-12-18 DIAGNOSIS — M797 Fibromyalgia: Secondary | ICD-10-CM | POA: Diagnosis not present

## 2015-12-18 DIAGNOSIS — J189 Pneumonia, unspecified organism: Secondary | ICD-10-CM | POA: Diagnosis not present

## 2015-12-19 ENCOUNTER — Other Ambulatory Visit: Payer: Self-pay | Admitting: Family Medicine

## 2015-12-19 DIAGNOSIS — J189 Pneumonia, unspecified organism: Secondary | ICD-10-CM | POA: Diagnosis not present

## 2015-12-19 DIAGNOSIS — G4733 Obstructive sleep apnea (adult) (pediatric): Secondary | ICD-10-CM | POA: Diagnosis not present

## 2015-12-19 DIAGNOSIS — R609 Edema, unspecified: Secondary | ICD-10-CM

## 2015-12-19 DIAGNOSIS — E119 Type 2 diabetes mellitus without complications: Secondary | ICD-10-CM | POA: Diagnosis not present

## 2015-12-19 DIAGNOSIS — L03116 Cellulitis of left lower limb: Secondary | ICD-10-CM | POA: Diagnosis not present

## 2015-12-19 DIAGNOSIS — M797 Fibromyalgia: Secondary | ICD-10-CM | POA: Diagnosis not present

## 2015-12-19 DIAGNOSIS — G894 Chronic pain syndrome: Secondary | ICD-10-CM | POA: Diagnosis not present

## 2015-12-20 ENCOUNTER — Ambulatory Visit
Admission: RE | Admit: 2015-12-20 | Discharge: 2015-12-20 | Disposition: A | Discharge status: Home / Self Care | Payer: Medicare Other | Source: Ambulatory Visit | Attending: Family Medicine | Admitting: Family Medicine

## 2015-12-20 DIAGNOSIS — R609 Edema, unspecified: Secondary | ICD-10-CM

## 2015-12-20 DIAGNOSIS — G4733 Obstructive sleep apnea (adult) (pediatric): Secondary | ICD-10-CM | POA: Diagnosis not present

## 2015-12-20 DIAGNOSIS — J189 Pneumonia, unspecified organism: Secondary | ICD-10-CM | POA: Diagnosis not present

## 2015-12-20 DIAGNOSIS — M797 Fibromyalgia: Secondary | ICD-10-CM | POA: Diagnosis not present

## 2015-12-20 DIAGNOSIS — M7989 Other specified soft tissue disorders: Secondary | ICD-10-CM | POA: Diagnosis not present

## 2015-12-20 DIAGNOSIS — E119 Type 2 diabetes mellitus without complications: Secondary | ICD-10-CM | POA: Diagnosis not present

## 2015-12-20 DIAGNOSIS — L03116 Cellulitis of left lower limb: Secondary | ICD-10-CM | POA: Diagnosis not present

## 2015-12-20 DIAGNOSIS — G894 Chronic pain syndrome: Secondary | ICD-10-CM | POA: Diagnosis not present

## 2015-12-25 DIAGNOSIS — G4733 Obstructive sleep apnea (adult) (pediatric): Secondary | ICD-10-CM | POA: Diagnosis not present

## 2015-12-25 DIAGNOSIS — G894 Chronic pain syndrome: Secondary | ICD-10-CM | POA: Diagnosis not present

## 2015-12-25 DIAGNOSIS — M797 Fibromyalgia: Secondary | ICD-10-CM | POA: Diagnosis not present

## 2015-12-25 DIAGNOSIS — L03116 Cellulitis of left lower limb: Secondary | ICD-10-CM | POA: Diagnosis not present

## 2015-12-25 DIAGNOSIS — J189 Pneumonia, unspecified organism: Secondary | ICD-10-CM | POA: Diagnosis not present

## 2015-12-25 DIAGNOSIS — E119 Type 2 diabetes mellitus without complications: Secondary | ICD-10-CM | POA: Diagnosis not present

## 2015-12-26 DIAGNOSIS — G894 Chronic pain syndrome: Secondary | ICD-10-CM | POA: Diagnosis not present

## 2015-12-26 DIAGNOSIS — M797 Fibromyalgia: Secondary | ICD-10-CM | POA: Diagnosis not present

## 2015-12-26 DIAGNOSIS — L03116 Cellulitis of left lower limb: Secondary | ICD-10-CM | POA: Diagnosis not present

## 2015-12-26 DIAGNOSIS — J189 Pneumonia, unspecified organism: Secondary | ICD-10-CM | POA: Diagnosis not present

## 2015-12-26 DIAGNOSIS — E119 Type 2 diabetes mellitus without complications: Secondary | ICD-10-CM | POA: Diagnosis not present

## 2015-12-26 DIAGNOSIS — G4733 Obstructive sleep apnea (adult) (pediatric): Secondary | ICD-10-CM | POA: Diagnosis not present

## 2015-12-27 DIAGNOSIS — E119 Type 2 diabetes mellitus without complications: Secondary | ICD-10-CM | POA: Diagnosis not present

## 2015-12-27 DIAGNOSIS — L03116 Cellulitis of left lower limb: Secondary | ICD-10-CM | POA: Diagnosis not present

## 2015-12-27 DIAGNOSIS — G894 Chronic pain syndrome: Secondary | ICD-10-CM | POA: Diagnosis not present

## 2015-12-27 DIAGNOSIS — F411 Generalized anxiety disorder: Secondary | ICD-10-CM | POA: Diagnosis not present

## 2015-12-27 DIAGNOSIS — F331 Major depressive disorder, recurrent, moderate: Secondary | ICD-10-CM | POA: Diagnosis not present

## 2015-12-27 DIAGNOSIS — G4733 Obstructive sleep apnea (adult) (pediatric): Secondary | ICD-10-CM | POA: Diagnosis not present

## 2015-12-27 DIAGNOSIS — J189 Pneumonia, unspecified organism: Secondary | ICD-10-CM | POA: Diagnosis not present

## 2015-12-27 DIAGNOSIS — M797 Fibromyalgia: Secondary | ICD-10-CM | POA: Diagnosis not present

## 2015-12-28 ENCOUNTER — Encounter (HOSPITAL_COMMUNITY): Payer: Self-pay | Admitting: *Deleted

## 2015-12-28 ENCOUNTER — Inpatient Hospital Stay (HOSPITAL_COMMUNITY): Payer: Medicare Other

## 2015-12-28 ENCOUNTER — Inpatient Hospital Stay (HOSPITAL_COMMUNITY)
Admission: EM | Admit: 2015-12-28 | Discharge: 2015-12-31 | DRG: 603 | Disposition: A | Discharge status: Skilled Nursing Facility | Payer: Medicare Other | Attending: Internal Medicine | Admitting: Internal Medicine

## 2015-12-28 DIAGNOSIS — L03116 Cellulitis of left lower limb: Secondary | ICD-10-CM | POA: Diagnosis not present

## 2015-12-28 DIAGNOSIS — R339 Retention of urine, unspecified: Secondary | ICD-10-CM | POA: Diagnosis present

## 2015-12-28 DIAGNOSIS — Z9119 Patient's noncompliance with other medical treatment and regimen: Secondary | ICD-10-CM

## 2015-12-28 DIAGNOSIS — G8929 Other chronic pain: Secondary | ICD-10-CM | POA: Diagnosis not present

## 2015-12-28 DIAGNOSIS — R0902 Hypoxemia: Secondary | ICD-10-CM

## 2015-12-28 DIAGNOSIS — Z79891 Long term (current) use of opiate analgesic: Secondary | ICD-10-CM | POA: Diagnosis not present

## 2015-12-28 DIAGNOSIS — E088 Diabetes mellitus due to underlying condition with unspecified complications: Secondary | ICD-10-CM

## 2015-12-28 DIAGNOSIS — G894 Chronic pain syndrome: Secondary | ICD-10-CM | POA: Diagnosis present

## 2015-12-28 DIAGNOSIS — Z8701 Personal history of pneumonia (recurrent): Secondary | ICD-10-CM

## 2015-12-28 DIAGNOSIS — Z79899 Other long term (current) drug therapy: Secondary | ICD-10-CM | POA: Diagnosis not present

## 2015-12-28 DIAGNOSIS — Z91041 Radiographic dye allergy status: Secondary | ICD-10-CM | POA: Diagnosis not present

## 2015-12-28 DIAGNOSIS — M79605 Pain in left leg: Secondary | ICD-10-CM | POA: Diagnosis not present

## 2015-12-28 DIAGNOSIS — Z96612 Presence of left artificial shoulder joint: Secondary | ICD-10-CM | POA: Diagnosis present

## 2015-12-28 DIAGNOSIS — Z888 Allergy status to other drugs, medicaments and biological substances status: Secondary | ICD-10-CM

## 2015-12-28 DIAGNOSIS — Z6841 Body Mass Index (BMI) 40.0 and over, adult: Secondary | ICD-10-CM | POA: Diagnosis not present

## 2015-12-28 DIAGNOSIS — Z794 Long term (current) use of insulin: Secondary | ICD-10-CM

## 2015-12-28 DIAGNOSIS — J9610 Chronic respiratory failure, unspecified whether with hypoxia or hypercapnia: Secondary | ICD-10-CM | POA: Diagnosis not present

## 2015-12-28 DIAGNOSIS — E0865 Diabetes mellitus due to underlying condition with hyperglycemia: Secondary | ICD-10-CM

## 2015-12-28 DIAGNOSIS — M797 Fibromyalgia: Secondary | ICD-10-CM | POA: Diagnosis present

## 2015-12-28 DIAGNOSIS — E1169 Type 2 diabetes mellitus with other specified complication: Secondary | ICD-10-CM | POA: Diagnosis present

## 2015-12-28 DIAGNOSIS — M069 Rheumatoid arthritis, unspecified: Secondary | ICD-10-CM | POA: Diagnosis present

## 2015-12-28 DIAGNOSIS — Z9049 Acquired absence of other specified parts of digestive tract: Secondary | ICD-10-CM | POA: Diagnosis not present

## 2015-12-28 DIAGNOSIS — F329 Major depressive disorder, single episode, unspecified: Secondary | ICD-10-CM | POA: Diagnosis present

## 2015-12-28 DIAGNOSIS — G4700 Insomnia, unspecified: Secondary | ICD-10-CM | POA: Diagnosis present

## 2015-12-28 DIAGNOSIS — D6489 Other specified anemias: Secondary | ICD-10-CM | POA: Diagnosis present

## 2015-12-28 DIAGNOSIS — R0602 Shortness of breath: Secondary | ICD-10-CM | POA: Diagnosis not present

## 2015-12-28 DIAGNOSIS — E785 Hyperlipidemia, unspecified: Secondary | ICD-10-CM | POA: Diagnosis not present

## 2015-12-28 DIAGNOSIS — J302 Other seasonal allergic rhinitis: Secondary | ICD-10-CM | POA: Diagnosis not present

## 2015-12-28 DIAGNOSIS — I10 Essential (primary) hypertension: Secondary | ICD-10-CM | POA: Diagnosis present

## 2015-12-28 DIAGNOSIS — G4733 Obstructive sleep apnea (adult) (pediatric): Secondary | ICD-10-CM | POA: Diagnosis present

## 2015-12-28 DIAGNOSIS — Z9981 Dependence on supplemental oxygen: Secondary | ICD-10-CM

## 2015-12-28 DIAGNOSIS — M6281 Muscle weakness (generalized): Secondary | ICD-10-CM | POA: Diagnosis not present

## 2015-12-28 DIAGNOSIS — L0390 Cellulitis, unspecified: Secondary | ICD-10-CM | POA: Insufficient documentation

## 2015-12-28 DIAGNOSIS — F419 Anxiety disorder, unspecified: Secondary | ICD-10-CM | POA: Diagnosis present

## 2015-12-28 DIAGNOSIS — J9601 Acute respiratory failure with hypoxia: Secondary | ICD-10-CM | POA: Diagnosis not present

## 2015-12-28 DIAGNOSIS — R52 Pain, unspecified: Secondary | ICD-10-CM | POA: Diagnosis not present

## 2015-12-28 DIAGNOSIS — G2581 Restless legs syndrome: Secondary | ICD-10-CM | POA: Diagnosis present

## 2015-12-28 DIAGNOSIS — F32A Depression, unspecified: Secondary | ICD-10-CM | POA: Diagnosis present

## 2015-12-28 DIAGNOSIS — M1990 Unspecified osteoarthritis, unspecified site: Secondary | ICD-10-CM | POA: Diagnosis not present

## 2015-12-28 DIAGNOSIS — Z818 Family history of other mental and behavioral disorders: Secondary | ICD-10-CM | POA: Diagnosis not present

## 2015-12-28 DIAGNOSIS — K219 Gastro-esophageal reflux disease without esophagitis: Secondary | ICD-10-CM | POA: Diagnosis not present

## 2015-12-28 DIAGNOSIS — J9611 Chronic respiratory failure with hypoxia: Secondary | ICD-10-CM | POA: Diagnosis present

## 2015-12-28 DIAGNOSIS — S91309A Unspecified open wound, unspecified foot, initial encounter: Secondary | ICD-10-CM | POA: Diagnosis not present

## 2015-12-28 DIAGNOSIS — F339 Major depressive disorder, recurrent, unspecified: Secondary | ICD-10-CM | POA: Diagnosis not present

## 2015-12-28 LAB — CBC WITH DIFFERENTIAL/PLATELET
Basophils Absolute: 0.1 10*3/uL (ref 0.0–0.1)
Basophils Relative: 1 %
Eosinophils Absolute: 0.4 10*3/uL (ref 0.0–0.7)
Eosinophils Relative: 4 %
HEMATOCRIT: 32 % — AB (ref 36.0–46.0)
Hemoglobin: 9.6 g/dL — ABNORMAL LOW (ref 12.0–15.0)
LYMPHS PCT: 20 %
Lymphs Abs: 1.9 10*3/uL (ref 0.7–4.0)
MCH: 24.1 pg — ABNORMAL LOW (ref 26.0–34.0)
MCHC: 30 g/dL (ref 30.0–36.0)
MCV: 80.2 fL (ref 78.0–100.0)
MONO ABS: 1.5 10*3/uL — AB (ref 0.1–1.0)
MONOS PCT: 16 %
NEUTROS ABS: 5.6 10*3/uL (ref 1.7–7.7)
Neutrophils Relative %: 59 %
Platelets: 382 10*3/uL (ref 150–400)
RBC: 3.99 MIL/uL (ref 3.87–5.11)
RDW: 18.6 % — AB (ref 11.5–15.5)
WBC: 9.3 10*3/uL (ref 4.0–10.5)

## 2015-12-28 LAB — BASIC METABOLIC PANEL
ANION GAP: 6 (ref 5–15)
BUN: 16 mg/dL (ref 6–20)
CALCIUM: 8.6 mg/dL — AB (ref 8.9–10.3)
CO2: 33 mmol/L — AB (ref 22–32)
Chloride: 101 mmol/L (ref 101–111)
Creatinine, Ser: 0.82 mg/dL (ref 0.44–1.00)
GFR calc Af Amer: >60 mL/min (ref >60)
GFR calc non Af Amer: >60 mL/min (ref >60)
GLUCOSE: 118 mg/dL — AB (ref 65–99)
POTASSIUM: 4 mmol/L (ref 3.5–5.1)
Sodium: 140 mmol/L (ref 135–145)

## 2015-12-28 LAB — GLUCOSE, CAPILLARY
GLUCOSE-CAPILLARY: 107 mg/dL — AB (ref 65–99)
GLUCOSE-CAPILLARY: 118 mg/dL — AB (ref 65–99)

## 2015-12-28 MED ORDER — LURASIDONE HCL 80 MG PO TABS
80.0000 mg | ORAL_TABLET | Freq: Every day | ORAL | Status: DC
  Administered 2015-12-28: 160 mg via ORAL
  Filled 2015-12-28 (×2): qty 1

## 2015-12-28 MED ORDER — ENOXAPARIN SODIUM 80 MG/0.8ML ~~LOC~~ SOLN
80.0000 mg | SUBCUTANEOUS | Status: DC
  Administered 2015-12-28 – 2015-12-30 (×3): 80 mg via SUBCUTANEOUS
  Filled 2015-12-28 (×4): qty 0.8

## 2015-12-28 MED ORDER — FUROSEMIDE 20 MG PO TABS
20.0000 mg | ORAL_TABLET | Freq: Every day | ORAL | Status: DC
  Administered 2015-12-29 – 2015-12-31 (×3): 20 mg via ORAL
  Filled 2015-12-28 (×3): qty 1

## 2015-12-28 MED ORDER — ALPRAZOLAM 0.5 MG PO TABS
0.5000 mg | ORAL_TABLET | Freq: Every day | ORAL | Status: DC | PRN
  Administered 2015-12-28 – 2015-12-31 (×4): 0.5 mg via ORAL
  Filled 2015-12-28 (×5): qty 1

## 2015-12-28 MED ORDER — SODIUM CHLORIDE 0.9 % IV SOLN
2500.0000 mg | INTRAVENOUS | Status: AC
  Administered 2015-12-28: 2500 mg via INTRAVENOUS
  Filled 2015-12-28: qty 2000

## 2015-12-28 MED ORDER — HYDROXYCHLOROQUINE SULFATE 200 MG PO TABS
200.0000 mg | ORAL_TABLET | Freq: Every morning | ORAL | Status: DC
  Administered 2015-12-29 – 2015-12-31 (×3): 200 mg via ORAL
  Filled 2015-12-28 (×3): qty 1

## 2015-12-28 MED ORDER — HYDROXYCHLOROQUINE SULFATE 200 MG PO TABS
100.0000 mg | ORAL_TABLET | Freq: Every evening | ORAL | Status: DC
  Administered 2015-12-28 – 2015-12-30 (×3): 100 mg via ORAL
  Filled 2015-12-28 (×5): qty 0.5

## 2015-12-28 MED ORDER — PANTOPRAZOLE SODIUM 40 MG PO TBEC
40.0000 mg | DELAYED_RELEASE_TABLET | Freq: Every day | ORAL | Status: DC
  Administered 2015-12-28 – 2015-12-31 (×4): 40 mg via ORAL
  Filled 2015-12-28 (×4): qty 1

## 2015-12-28 MED ORDER — ROPINIROLE HCL 1 MG PO TABS
4.0000 mg | ORAL_TABLET | Freq: Every day | ORAL | Status: DC
  Administered 2015-12-28 – 2015-12-30 (×3): 4 mg via ORAL
  Filled 2015-12-28 (×3): qty 4
  Filled 2015-12-28: qty 8

## 2015-12-28 MED ORDER — HYDROMORPHONE HCL 1 MG/ML IJ SOLN
1.0000 mg | INTRAMUSCULAR | Status: DC | PRN
  Administered 2015-12-28: 1 mg via INTRAVENOUS
  Filled 2015-12-28: qty 1

## 2015-12-28 MED ORDER — SODIUM CHLORIDE 0.9% FLUSH
3.0000 mL | Freq: Two times a day (BID) | INTRAVENOUS | Status: DC
  Administered 2015-12-28 – 2015-12-30 (×2): 3 mL via INTRAVENOUS

## 2015-12-28 MED ORDER — SODIUM CHLORIDE 0.9 % IV SOLN
250.0000 mL | INTRAVENOUS | Status: DC | PRN
  Administered 2015-12-28: 250 mL via INTRAVENOUS

## 2015-12-28 MED ORDER — TAB-A-VITE/IRON PO TABS
1.0000 | ORAL_TABLET | Freq: Every day | ORAL | Status: DC
  Administered 2015-12-28 – 2015-12-31 (×4): 1 via ORAL
  Filled 2015-12-28 (×5): qty 1

## 2015-12-28 MED ORDER — ESCITALOPRAM OXALATE 20 MG PO TABS
40.0000 mg | ORAL_TABLET | Freq: Every morning | ORAL | Status: DC
  Administered 2015-12-29 – 2015-12-31 (×3): 40 mg via ORAL
  Filled 2015-12-28 (×3): qty 2

## 2015-12-28 MED ORDER — NYSTATIN 100000 UNIT/GM EX CREA
1.0000 "application " | TOPICAL_CREAM | Freq: Two times a day (BID) | CUTANEOUS | Status: DC
  Administered 2015-12-28 – 2015-12-31 (×2): 1 via TOPICAL
  Filled 2015-12-28: qty 15

## 2015-12-28 MED ORDER — SODIUM CHLORIDE 0.9% FLUSH: 3.0000 mL | INTRAVENOUS | Status: DC | PRN

## 2015-12-28 MED ORDER — MORPHINE SULFATE ER 15 MG PO TBCR
15.0000 mg | EXTENDED_RELEASE_TABLET | Freq: Two times a day (BID) | ORAL | Status: DC
  Administered 2015-12-28 – 2015-12-31 (×6): 15 mg via ORAL
  Filled 2015-12-28 (×6): qty 1

## 2015-12-28 MED ORDER — KETOROLAC TROMETHAMINE 15 MG/ML IJ SOLN
15.0000 mg | Freq: Three times a day (TID) | INTRAMUSCULAR | Status: DC | PRN
  Administered 2015-12-28 – 2015-12-30 (×4): 15 mg via INTRAVENOUS
  Filled 2015-12-28 (×4): qty 1

## 2015-12-28 MED ORDER — LORATADINE 10 MG PO TABS
10.0000 mg | ORAL_TABLET | Freq: Every day | ORAL | Status: DC
Start: 2015-12-28 — End: 2015-12-31
  Administered 2015-12-28 – 2015-12-31 (×4): 10 mg via ORAL
  Filled 2015-12-28 (×4): qty 1

## 2015-12-28 MED ORDER — VITAMIN D3 25 MCG (1000 UNIT) PO TABS
5000.0000 [IU] | ORAL_TABLET | Freq: Every day | ORAL | Status: DC
  Administered 2015-12-28 – 2015-12-31 (×4): 5000 [IU] via ORAL
  Filled 2015-12-28 (×5): qty 5

## 2015-12-28 MED ORDER — ONDANSETRON HCL 4 MG/2ML IJ SOLN
4.0000 mg | Freq: Once | INTRAMUSCULAR | Status: AC
  Administered 2015-12-28: 4 mg via INTRAVENOUS
  Filled 2015-12-28: qty 2

## 2015-12-28 MED ORDER — OXYCODONE HCL 5 MG PO TABS
5.0000 mg | ORAL_TABLET | Freq: Four times a day (QID) | ORAL | Status: DC | PRN
  Administered 2015-12-28 – 2015-12-31 (×9): 10 mg via ORAL
  Filled 2015-12-28 (×9): qty 2

## 2015-12-28 MED ORDER — INSULIN ASPART 100 UNIT/ML ~~LOC~~ SOLN
0.0000 [IU] | Freq: Three times a day (TID) | SUBCUTANEOUS | Status: DC
  Administered 2015-12-30: 1 [IU] via SUBCUTANEOUS

## 2015-12-28 MED ORDER — VANCOMYCIN HCL 10 G IV SOLR
1250.0000 mg | Freq: Two times a day (BID) | INTRAVENOUS | Status: DC
  Administered 2015-12-29 – 2015-12-30 (×3): 1250 mg via INTRAVENOUS
  Filled 2015-12-28 (×4): qty 1250

## 2015-12-28 MED ORDER — ONDANSETRON HCL 4 MG PO TABS: 4.0000 mg | ORAL_TABLET | Freq: Four times a day (QID) | ORAL | Status: DC | PRN

## 2015-12-28 MED ORDER — ONDANSETRON HCL 4 MG/2ML IJ SOLN: 4.0000 mg | Freq: Four times a day (QID) | INTRAMUSCULAR | Status: DC | PRN

## 2015-12-28 MED ORDER — BUPROPION HCL ER (XL) 300 MG PO TB24
450.0000 mg | ORAL_TABLET | Freq: Every morning | ORAL | Status: DC
  Administered 2015-12-29 – 2015-12-31 (×3): 450 mg via ORAL
  Filled 2015-12-28 (×3): qty 1

## 2015-12-28 MED ORDER — POLYETHYLENE GLYCOL 3350 17 G PO PACK
17.0000 g | PACK | Freq: Every day | ORAL | Status: DC | PRN
  Administered 2015-12-29 – 2015-12-30 (×2): 17 g via ORAL
  Filled 2015-12-28 (×2): qty 1

## 2015-12-28 MED ORDER — OXYCODONE HCL 5 MG PO TABS
5.0000 mg | ORAL_TABLET | Freq: Three times a day (TID) | ORAL | Status: DC | PRN
  Administered 2015-12-28: 5 mg via ORAL
  Filled 2015-12-28: qty 1

## 2015-12-28 MED ORDER — LURASIDONE HCL 80 MG PO TABS
160.0000 mg | ORAL_TABLET | Freq: Every day | ORAL | Status: DC
  Administered 2015-12-29 – 2015-12-31 (×3): 160 mg via ORAL
  Filled 2015-12-28 (×4): qty 2

## 2015-12-28 NOTE — Progress Notes (Signed)
Patient refuses CPAP tonight. Encouraged patient to call if she changed her mind/

## 2015-12-28 NOTE — H&P (Addendum)
History and Physical    Jasmine Huff [id number] DOB: 06-01-1956 DOA: 12/28/2015  PCP: Lilian Coma, MD  Patient coming from: Home  Chief Complaint: Left Leg pain.   HPI: Jasmine Huff is a 60 y.o. female with medical history significant of of morbid obesity, HTN, anxiety, chronic pain, OSA noncompliant with CPAP, RSL, DM type II; last admission 11-16-2015 for PNA, acute hypoxic  respiratory failure, who presents with worsening Left Lower extremity swelling, redness. She was treated last month for cellulitis. She relates that never went away completely. She report worsening redness since 1 week ago. She saw PCP who started her on Keflex and lasix. She presents today with worsening leg pain and redness. She has been on antibiotics for 4- to 5 days .  She is still on oxygen since last admission. Has appointment follow up with pulmonology on Tuesday. She is not using CPAP, she lost her machine when she moved.   ED Course: presents  cellulitis of LE, fail outpatient antibiotics. WBC nl, afebrile,CO@33 .   Review of Systems: As per HPI otherwise 10 point review of systems negative.    Past Medical History  Diagnosis Date  . Allergy   . Chronic pain syndrome   . Knee pain   . Urination frequency   . Anemia   . Sleep apnea     CPAP  . Migraine   . Fibromyalgia   . H/O hiatal hernia   . Anxiety     takes Xanax daily as needed  . Panic attacks   . Hypertension     takes Losartan daily  . Depression     takes Latuda,Wellbutrin,Lexapro daily  . Insomnia     takes Ambien nightly  . Shortness of breath dyspnea     with exertion  . History of migraine     last one a wk ago;takes Excedrine as needed  . Weakness     left arm  . Restless leg     takes Requip nightly  . Arthritis     RA  . Joint pain   . Chronic back pain     bulding disc and arthritis.  Marland Kitchen GERD (gastroesophageal reflux disease) 01/21/2011    takes omeprazole daily  . Urinary urgency   . Urinary frequency   .  Nocturia     Past Surgical History  Procedure Laterality Date  . Abdominal hysterectomy    . Right ovary removal    . Left hemicolectomy with splenic flexure mobilization and end colostomy  04/04/2012  . Laparotomy  04/04/2012    Procedure: EXPLORATORY LAPAROTOMY;  Surgeon: Leighton Ruff, MD;  Location: [facility];  Service: General;  Laterality: N/A;  left colectomy  . Colostomy  04/04/2012    Procedure: COLOSTOMY;  Surgeon: Leighton Ruff, MD;  Location: [facility];  Service: General;  Laterality: N/A;  . Colonoscopy N/A 11/12/2012    Procedure: COLONOSCOPY;  Surgeon: Leighton Ruff, MD;  Location: [facility];  Service: Endoscopy;  Laterality: N/A;  . Colon surgery  Oct 2013    perf bowel w/ colostomy  . Cholecystectomy  10 yrs ago  . Colostomy reversal N/A 02/17/2013    Procedure:  LAPAROSCOPIC COLOSTOMY REVERSAL; SMALL BOWEL RESECTION; LYSIS OF ADHESIONS ;  Surgeon: Leighton Ruff, MD;  Location: [facility];  Service: General;  Laterality: N/A;  . Colonoscopy    . Refractive surgery    . Total shoulder arthroplasty Left 09/27/2015    Procedure: TOTAL SHOULDER ARTHROPLASTY;  Surgeon: Tania Ade, MD;  Location: [facility]  OR;  Service: Orthopedics;  Laterality: Left;  Left shoulder arthroplasty     reports that she has never smoked. She has never used smokeless tobacco. She reports that she does not drink alcohol or use illicit drugs.  Allergies  Allergen Reactions  . Asenapine Anaphylaxis, Shortness Of Breath and Other (See Comments)    Lips tingle and tongue becomes numb/Akasthesia  . Saphris [Asenapine Maleate] Anaphylaxis, Shortness Of Breath and Other (See Comments)    Lips tingle and tongue becomes numb/Akasthesia  . Seroquel [Quetiapine Fumarate] Shortness Of Breath and Other (See Comments)    "out of breath"  . Iodinated Diagnostic Agents Hives    Dye used for CT scans, severe hives  . Pregabalin Rash and Other (See Comments)    Alters Mental status, flushing  Caused by lyrica  .  Buprenorphine Rash    Adhesive on butrans patch    Family History  Problem Relation Age of Onset  . Depression Mother   . Depression Father   . Alcohol abuse Brother   . Depression Brother   . Depression Maternal Grandfather   . Depression Other      Prior to Admission medications   Medication Sig Start Date End Date Taking? Authorizing Provider  acetaminophen (TYLENOL) 500 MG tablet Take 1,000 mg by mouth every 6 (six) hours as needed for moderate pain.    Yes Historical Provider, MD  ALPRAZolam Duanne Moron) 0.5 MG tablet Take 0.5 mg by mouth daily as needed for anxiety.   Yes Historical Provider, MD  amLODipine (NORVASC) 5 MG tablet Take 1 tablet (5 mg total) by mouth daily. 11/16/15  Yes Tauri Ethington A Thanh Mottern, MD  buPROPion (WELLBUTRIN XL) 150 MG 24 hr tablet Take 450 mg by mouth every morning.   Yes Historical Provider, MD  cephALEXin (KEFLEX) 500 MG capsule Take 500 mg by mouth 2 (two) times daily. 12/24/15  Yes Historical Provider, MD  cetirizine (ZYRTEC) 10 MG tablet Take 10 mg by mouth every evening.    Yes Historical Provider, MD  Cholecalciferol (VITAMIN D3) 5000 units TABS Take 5,000 Units by mouth daily.   Yes Historical Provider, MD  escitalopram (LEXAPRO) 20 MG tablet Take 40 mg by mouth every morning.  10/12/12  Yes Historical Provider, MD  furosemide (LASIX) 20 MG tablet Take 20 mg by mouth daily. 12/07/15  Yes Historical Provider, MD  hydroxychloroquine (PLAQUENIL) 200 MG tablet Take 100-200 mg by mouth 2 (two) times daily. 200mg  in the morning, 100mg  in the evening 08/04/11  Yes Historical Provider, MD  Iron-FA-B Cmp-C-Biot-Probiotic (FUSION PLUS) CAPS Take 1 capsule by mouth daily. 11/29/15  Yes Historical Provider, MD  lurasidone (LATUDA) 80 MG TABS tablet Take 1 tablet (80 mg total) by mouth daily. Patient taking differently: Take 160 mg by mouth daily.  11/16/15  Yes Aimy Sweeting A Milli Woolridge, MD  morphine (MS CONTIN) 15 MG 12 hr tablet Take 1 tablet (15 mg total) by mouth 2 (two) times  daily. 11/16/15  Yes Nilda Keathley A Adya Wirz, MD  nystatin cream (MYCOSTATIN) Apply 1 application topically 2 (two) times daily. Uses for yeast infection   Yes Historical Provider, MD  omeprazole (PRILOSEC) 40 MG capsule Take 40 mg by mouth every evening.  11/16/12  Yes Historical Provider, MD  oxyCODONE (OXY IR/ROXICODONE) 5 MG immediate release tablet Take 1 tablet (5 mg total) by mouth every 8 (eight) hours as needed for moderate pain. 11/16/15  Yes Tauriel Scronce A Decarlo Rivet, MD  rOPINIRole (REQUIP) 4 MG tablet Take 4 mg by mouth at  bedtime.   Yes Historical Provider, MD  amoxicillin-clavulanate (AUGMENTIN) 875-125 MG tablet Take 1 tablet by mouth every 12 (twelve) hours. Patient not taking: Reported on 12/28/2015 11/16/15   Contrell Ballentine A Derrel Moore, MD  glipiZIDE (GLUCOTROL) 5 MG tablet Take 0.5 tablets (2.5 mg total) by mouth daily before breakfast. Patient not taking: Reported on 12/28/2015 11/16/15   Elmarie Shiley, MD    Physical Exam: Filed Vitals:   12/28/15 1135 12/28/15 1234 12/28/15 1300  BP: 126/57  153/78  Pulse: 93  95  Temp: 98.2 F (36.8 C)    TempSrc: Oral    Resp: 16    Height:  5\' 3"  (1.6 m)   Weight:  159.213 kg (351 lb)   SpO2: 98%  97%      Constitutional: NAD, calm, comfortable, alert answering questions. 2 L oxygen.  Filed Vitals:   12/28/15 1135 12/28/15 1234 12/28/15 1300  BP: 126/57  153/78  Pulse: 93  95  Temp: 98.2 F (36.8 C)    TempSrc: Oral    Resp: 16    Height:  5\' 3"  (1.6 m)   Weight:  159.213 kg (351 lb)   SpO2: 98%  97%   Eyes: PERRL, lids and conjunctivae normal ENMT: Mucous membranes are moist. Posterior pharynx clear of any exudate or lesions.Normal dentition.  Neck: normal, supple, no masses, no thyromegaly Respiratory: clear to auscultation bilaterally, no wheezing, no crackles. Normal respiratory effort. No accessory muscle use.  Cardiovascular: Regular rate and rhythm, no murmurs / rubs / gallops. No extremity edema. 2+ pedal pulses. No carotid bruits.   Abdomen: no tenderness, no masses palpated. No hepatosplenomegaly. Bowel sounds positive.  Musculoskeletal: no clubbing / cyanosis. No joint deformity upper and lower extremities. Good ROM, no contractures. Normal muscle tone.  Skin: left LE with edema, redness,blister plantar aspect of foot.  Neurologic: CN 2-12 grossly intact. Sensation intact, DTR normal. Strength 5/5 in all 4.  Psychiatric: Normal judgment and insight. Alert and oriented x 3. Normal mood.     Labs on Admission: I have personally reviewed following labs and imaging studies  CBC:  Recent Labs Lab 12/28/15 1240  WBC 9.3  NEUTROABS 5.6  HGB 9.6*  HCT 32.0*  MCV 80.2  PLT 99991111   Basic Metabolic Panel:  Recent Labs Lab 12/28/15 1240  NA 140  K 4.0  CL 101  CO2 33*  GLUCOSE 118*  BUN 16  CREATININE 0.82  CALCIUM 8.6*   GFR: Estimated Creatinine Clearance: 110.9 mL/min (by C-G formula based on Cr of 0.82). Liver Function Tests: No results for input(s): AST, ALT, ALKPHOS, BILITOT, PROT, ALBUMIN in the last 168 hours. No results for input(s): LIPASE, AMYLASE in the last 168 hours. No results for input(s): AMMONIA in the last 168 hours. Coagulation Profile: No results for input(s): INR, PROTIME in the last 168 hours. Cardiac Enzymes: No results for input(s): CKTOTAL, CKMB, CKMBINDEX, TROPONINI in the last 168 hours. BNP (last 3 results) No results for input(s): PROBNP in the last 8760 hours. HbA1C: No results for input(s): HGBA1C in the last 72 hours. CBG: No results for input(s): GLUCAP in the last 168 hours. Lipid Profile: No results for input(s): CHOL, HDL, LDLCALC, TRIG, CHOLHDL, LDLDIRECT in the last 72 hours. Thyroid Function Tests: No results for input(s): TSH, T4TOTAL, FREET4, T3FREE, THYROIDAB in the last 72 hours. Anemia Panel: No results for input(s): VITAMINB12, FOLATE, FERRITIN, TIBC, IRON, RETICCTPCT in the last 72 hours. Urine analysis:    Component Value Date/Time  COLORURINE  AMBER* 11/13/2015 0250   APPEARANCEUR CLOUDY* 11/13/2015 0250   LABSPEC 1.040* 11/13/2015 0250   PHURINE 6.0 11/13/2015 0250   GLUCOSEU NEGATIVE 11/13/2015 0250   HGBUR NEGATIVE 11/13/2015 0250   BILIRUBINUR NEGATIVE 11/13/2015 0250   KETONESUR NEGATIVE 11/13/2015 0250   PROTEINUR 100* 11/13/2015 0250   UROBILINOGEN 0.2 06/14/2013 1048   NITRITE NEGATIVE 11/13/2015 0250   LEUKOCYTESUR NEGATIVE 11/13/2015 0250   Sepsis Labs: !!!!!!!!!!!!!!!!!!!!!!!!!!!!!!!!!!!!!!!!!!!! @LABRCNTIP (procalcitonin:4,lacticidven:4) )No results found for this or any previous visit (from the past 240 hour(s)).   Radiological Exams on Admission: No results found.  [id number] available.   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  Admit to med-surgery IV vancomycin.  Wound care consulted.  Blood culture ordered.  Doppler negative for DVT  Depression;  Continue with Wellbutrin, lexapro, Latuda.   OSA; CPAP ordered. CM to help arrange CPAP for home.   Chronic Hypoxic Respiratory failure uncertain etiology, last admission treated for PNA> ,on2 L oxygen. supposed to follow up with Pulmonologist next Tuesday.  Will check Chest xray to follow up resolution of infiltrates from last PNA.   Chronic pain syndrome;  continue with morphine and oxycodone. Will need to be careful with oversedation,.   RA; continue with plaquenil DM; SSI. unclearif she has been taking glipizide.   DVT prophylaxis: Lovenox.  Code Status: Full Code.  Family Communication: Care discussed with patient.  Disposition Plan: discharge home in 2 to 3 days  Consults called: none Admission status: inpatient. medsurgery     Elmarie Shiley MD [facility] Pager [phone]  If 7PM-7AM, please contact night-coverage [email or web address] Password [facility]  12/28/2015, 2:49 PM

## 2015-12-28 NOTE — ED Notes (Signed)
Bed: WA03 Expected date:  Expected time:  Means of arrival:  Comments: EMS 

## 2015-12-28 NOTE — Progress Notes (Signed)
Pharmacy Antibiotic Note  Jasmine Huff is a 60 y.o. female admitted on 12/28/2015 with cellulitis, worsening despite outpatient antibiotics (Cephalexin started on 6/19.)  Venous imaging negative for DVT.  Pharmacy has been consulted for Vancomycin dosing.  Plan: Vancomycin 2500 mg IV stat, then 1250 mg IV q12h. Measure Vanc trough at steady state.  Goal VT 10-15. Follow up renal fxn, culture results, and clinical course.  Height: 5\' 3"  (160 cm) Weight: (!) 351 lb (159.213 kg) IBW/kg (Calculated) : 52.4  Temp (24hrs), Avg:98.2 F (36.8 C), Min:98.2 F (36.8 C), Max:98.2 F (36.8 C)   Recent Labs Lab 12/28/15 1240  WBC 9.3  CREATININE 0.82    Estimated Creatinine Clearance: 110.9 mL/min (by C-G formula based on Cr of 0.82).    Allergies  Allergen Reactions  . Asenapine Anaphylaxis, Shortness Of Breath and Other (See Comments)    Lips tingle and tongue becomes numb/Akasthesia  . Saphris [Asenapine Maleate] Anaphylaxis, Shortness Of Breath and Other (See Comments)    Lips tingle and tongue becomes numb/Akasthesia  . Seroquel [Quetiapine Fumarate] Shortness Of Breath and Other (See Comments)    "out of breath"  . Iodinated Diagnostic Agents Hives    Dye used for CT scans, severe hives  . Pregabalin Rash and Other (See Comments)    Alters Mental status, flushing  Caused by lyrica  . Buprenorphine Rash    Adhesive on butrans patch    Antimicrobials this admission: 6/23 Vancomycin >>   Dose adjustments this admission:  Microbiology results: 6/23 BCx: sent  Thank you for allowing pharmacy to be a part of this patient's care.  Gretta Arab PharmD, BCPS Pager [phone] 12/28/2015 12:07 PM

## 2015-12-28 NOTE — ED Notes (Signed)
Per EMS - patient comes from home where she lives with her brother.  Patient was dx with cellulitis in lower left leg by PCP last week and started on abx.  Patient states pain is getting worse.  Patient denies fever and N/V.  Patient's vitals on scene, 150/65, HR 99, 96% on 2L [place] (patien is oxygen dependent at home).

## 2015-12-28 NOTE — ED Provider Notes (Signed)
CSN: [id number]     Arrival date & time 12/28/15  1100 History   First MD Initiated Contact with Patient 12/28/15 1135     Chief Complaint  Patient presents with  . Leg Pain     HPI  Patient presents for evaluation of redness pain and swelling of her left leg. Lives at home with her brother. Trans[place] by EMS. She's had pain and swelling and redness in her left leg for the last week. On Monday was seen by primary care physician and had negative Doppler. Was started on a second generation oral cephalosporin. Has not improved. Has not had fever shakes or chills. However she states the redness has gone from her medial ankle to her thigh him. Describes severe pain in her leg." Making my fibromyalgia worse".  Past Medical History  Diagnosis Date  . Allergy   . Chronic pain syndrome   . Knee pain   . Urination frequency   . Anemia   . Sleep apnea     CPAP  . Migraine   . Fibromyalgia   . H/O hiatal hernia   . Anxiety     takes Xanax daily as needed  . Panic attacks   . Hypertension     takes Losartan daily  . Depression     takes Latuda,Wellbutrin,Lexapro daily  . Insomnia     takes Ambien nightly  . Shortness of breath dyspnea     with exertion  . History of migraine     last one a wk ago;takes Excedrine as needed  . Weakness     left arm  . Restless leg     takes Requip nightly  . Arthritis     RA  . Joint pain   . Chronic back pain     bulding disc and arthritis.  Marland Kitchen GERD (gastroesophageal reflux disease) 01/21/2011    takes omeprazole daily  . Urinary urgency   . Urinary frequency   . Nocturia    Past Surgical History  Procedure Laterality Date  . Abdominal hysterectomy    . Right ovary removal    . Left hemicolectomy with splenic flexure mobilization and end colostomy  04/04/2012  . Laparotomy  04/04/2012    Procedure: EXPLORATORY LAPAROTOMY;  Surgeon: Leighton Ruff, MD;  Location: [facility];  Service: General;  Laterality: N/A;  left colectomy  . Colostomy   04/04/2012    Procedure: COLOSTOMY;  Surgeon: Leighton Ruff, MD;  Location: [facility];  Service: General;  Laterality: N/A;  . Colonoscopy N/A 11/12/2012    Procedure: COLONOSCOPY;  Surgeon: Leighton Ruff, MD;  Location: [facility];  Service: Endoscopy;  Laterality: N/A;  . Colon surgery  Oct 2013    perf bowel w/ colostomy  . Cholecystectomy  10 yrs ago  . Colostomy reversal N/A 02/17/2013    Procedure:  LAPAROSCOPIC COLOSTOMY REVERSAL; SMALL BOWEL RESECTION; LYSIS OF ADHESIONS ;  Surgeon: Leighton Ruff, MD;  Location: [facility];  Service: General;  Laterality: N/A;  . Colonoscopy    . Refractive surgery    . Total shoulder arthroplasty Left 09/27/2015    Procedure: TOTAL SHOULDER ARTHROPLASTY;  Surgeon: Tania Ade, MD;  Location: [place];  Service: Orthopedics;  Laterality: Left;  Left shoulder arthroplasty   Family History  Problem Relation Age of Onset  . Depression Mother   . Depression Father   . Alcohol abuse Brother   . Depression Brother   . Depression Maternal Grandfather   . Depression Other  Social History  Substance Use Topics  . Smoking status: Never Smoker   . Smokeless tobacco: Never Used  . Alcohol Use: No   OB History    No data available     Review of Systems  Constitutional: Negative for fever, chills, diaphoresis, appetite change and fatigue.  HENT: Negative for mouth sores, sore throat and trouble swallowing.   Eyes: Negative for visual disturbance.  Respiratory: Negative for cough, chest tightness, shortness of breath and wheezing.   Cardiovascular: Negative for chest pain.  Gastrointestinal: Negative for nausea, vomiting, abdominal pain, diarrhea and abdominal distention.  Endocrine: Negative for polydipsia, polyphagia and polyuria.  Genitourinary: Negative for dysuria, frequency and hematuria.  Musculoskeletal: Negative for gait problem.  Skin: Positive for color change. Negative for pallor.       Redness pain and swelling to the left lower leg.   Neurological: Negative for dizziness, syncope, light-headedness and headaches.  Hematological: Does not bruise/bleed easily.  Psychiatric/Behavioral: Negative for behavioral problems and confusion.      Allergies  Asenapine; Saphris; Seroquel; Iodinated diagnostic agents; Pregabalin; and Buprenorphine  Home Medications   Prior to Admission medications   Medication Sig Start Date End Date Taking? Authorizing Provider  acetaminophen (TYLENOL) 500 MG tablet Take 1,000 mg by mouth every 6 (six) hours as needed for moderate pain.    Yes Historical Provider, MD  ALPRAZolam Duanne Moron) 0.5 MG tablet Take 0.5 mg by mouth daily as needed for anxiety.   Yes Historical Provider, MD  amLODipine (NORVASC) 5 MG tablet Take 1 tablet (5 mg total) by mouth daily. 11/16/15  Yes Belkys A Regalado, MD  buPROPion (WELLBUTRIN XL) 150 MG 24 hr tablet Take 450 mg by mouth every morning.   Yes Historical Provider, MD  cephALEXin (KEFLEX) 500 MG capsule Take 500 mg by mouth 2 (two) times daily. 12/24/15  Yes Historical Provider, MD  cetirizine (ZYRTEC) 10 MG tablet Take 10 mg by mouth every evening.    Yes Historical Provider, MD  Cholecalciferol (VITAMIN D3) 5000 units TABS Take 5,000 Units by mouth daily.   Yes Historical Provider, MD  escitalopram (LEXAPRO) 20 MG tablet Take 40 mg by mouth every morning.  10/12/12  Yes Historical Provider, MD  furosemide (LASIX) 20 MG tablet Take 20 mg by mouth daily. 12/07/15  Yes Historical Provider, MD  hydroxychloroquine (PLAQUENIL) 200 MG tablet Take 100-200 mg by mouth 2 (two) times daily. 200mg  in the morning, 100mg  in the evening 08/04/11  Yes Historical Provider, MD  Iron-FA-B Cmp-C-Biot-Probiotic (FUSION PLUS) CAPS Take 1 capsule by mouth daily. 11/29/15  Yes Historical Provider, MD  lurasidone (LATUDA) 80 MG TABS tablet Take 1 tablet (80 mg total) by mouth daily. Patient taking differently: Take 160 mg by mouth daily.  11/16/15  Yes Belkys A Regalado, MD  morphine (MS CONTIN) 15  MG 12 hr tablet Take 1 tablet (15 mg total) by mouth 2 (two) times daily. 11/16/15  Yes Belkys A Regalado, MD  nystatin cream (MYCOSTATIN) Apply 1 application topically 2 (two) times daily. Uses for yeast infection   Yes Historical Provider, MD  omeprazole (PRILOSEC) 40 MG capsule Take 40 mg by mouth every evening.  11/16/12  Yes Historical Provider, MD  oxyCODONE (OXY IR/ROXICODONE) 5 MG immediate release tablet Take 1 tablet (5 mg total) by mouth every 8 (eight) hours as needed for moderate pain. 11/16/15  Yes Belkys A Regalado, MD  rOPINIRole (REQUIP) 4 MG tablet Take 4 mg by mouth at bedtime.   Yes Historical Provider,  MD  amoxicillin-clavulanate (AUGMENTIN) 875-125 MG tablet Take 1 tablet by mouth every 12 (twelve) hours. Patient not taking: Reported on 12/28/2015 11/16/15   Belkys A Regalado, MD  glipiZIDE (GLUCOTROL) 5 MG tablet Take 0.5 tablets (2.5 mg total) by mouth daily before breakfast. Patient not taking: Reported on 12/28/2015 11/16/15   Belkys A Regalado, MD   BP 126/57 mmHg  Pulse 93  Temp(Src) 98.2 F (36.8 C) (Oral)  Resp 16  Ht 5\' 3"  (1.6 m)  Wt 351 lb (159.213 kg)  BMI 62.19 kg/m2  SpO2 98% Physical Exam  Constitutional: She is oriented to person, place, and time. She appears well-developed and well-nourished. No distress.  HENT:  Head: Normocephalic.  Eyes: Conjunctivae are normal. Pupils are equal, round, and reactive to light. No scleral icterus.  Neck: Normal range of motion. Neck supple. No thyromegaly present.  Cardiovascular: Normal rate and regular rhythm.  Exam reveals no gallop and no friction rub.   No murmur heard. Pulmonary/Chest: Effort normal and breath sounds normal. No respiratory distress. She has no wheezes. She has no rales.  Abdominal: Soft. Bowel sounds are normal. She exhibits no distension. There is no tenderness. There is no rebound.  Musculoskeletal: Normal range of motion.  Neurological: She is alert and oriented to person, place, and time.   Skin: Skin is warm and dry. No rash noted.  Bilateral lower extremity lymphedema. Left greater than right. Erythema circumferentially about the ankle and up the medial lower leg, knee, thigh. Stop short of the groin. No inguinal adenopathy.  Psychiatric: She has a normal mood and affect. Her behavior is normal.    ED Course  Procedures (including critical care time) Labs Review Labs Reviewed  BASIC METABOLIC PANEL - Abnormal; Notable for the following:    CO2 33 (*)    Glucose, Bld 118 (*)    Calcium 8.6 (*)    All other components within normal limits  CBC WITH DIFFERENTIAL/PLATELET - Abnormal; Notable for the following:    Hemoglobin 9.6 (*)    HCT 32.0 (*)    MCH 24.1 (*)    RDW 18.6 (*)    Monocytes Absolute 1.5 (*)    All other components within normal limits  CULTURE, BLOOD (ROUTINE X 2)  CULTURE, BLOOD (ROUTINE X 2)    Imaging Review No results found. I have personally reviewed and evaluated these images and lab results as part of my medical decision-making.   EKG Interpretation None      MDM   Final diagnoses:  Cellulitis of left lower extremity   Clinical patient has left lower extremity cellulitis is failed 5 days of outpatient therapy. She had a negative Doppler ultrasound with her physician 4 days ago. Labs clear blood cultures are obtained. Given IV vancomycin. I discussed the case with Dr. Delanna Notice of that hospitals for patient be admitted for treatment of her cellulitis status post failed outpatient treatment.    Tanna Furry, MD 12/28/15 1359

## 2015-12-28 NOTE — Consult Note (Signed)
WOC wound consult note Reason for Consult: LLE cellulitis with plantar heel blister (intact) in the presence of venous insufficiency and suspected lymphedema. Wound type:Infectious, chronic venous insufficiency (suspected lymphedema) Pressure Ulcer POA: No Measurement:5cm x 6cm intact, serum filled blister with ecchymosis and white patches in the immediate periwound area.  Wound bed:As described above.  No wound bed as serum-filled blister is intact. Drainage (amount, consistency, odor) No drainage, no odor Periwound: LLE is edematous, erythematous and warm. Dressing procedure/placement/frequency: I have implemented conservative topical care aimed at padding, moisturizing and protecting the intact blister.  Patient will be provided with bilateral Prevalon boots to redistribute pressure and prevent pressure injuries while in bed.  She is provided a bariatric bed as her body habitus exceeds our standard hospital bed and a bariatric bedside commode is requested so that patient does not have to be weight bearing on the blister as she ambulates to the bathroom. The conservative care will likely still be appropriate even if the blister ruptures, but ideally the serum filled blister will reabsorb with the reduction in edema from lying with LEs elevated. Patient would benefit from referral to an outpatient wound care center for management of her lower extremity edema as complications from lymphedema are more likely than not to be realized downstream. If you agree, please make referral to the center of her choosing. [place] nursing team will not follow, but will remain available to this patient, the nursing and medical teams.  Please re-consult if needed. Thanks, Maudie Flakes, MSN, RN, [place], Arther Abbott  Pager# [phone]

## 2015-12-28 NOTE — ED Notes (Signed)
Patient requested and received a sandwich and milk.

## 2015-12-29 LAB — CBC
HCT: 29 % — ABNORMAL LOW (ref 36.0–46.0)
Hemoglobin: 8.7 g/dL — ABNORMAL LOW (ref 12.0–15.0)
MCH: 23.8 pg — ABNORMAL LOW (ref 26.0–34.0)
MCHC: 30 g/dL (ref 30.0–36.0)
MCV: 79.2 fL (ref 78.0–100.0)
PLATELETS: 327 10*3/uL (ref 150–400)
RBC: 3.66 MIL/uL — AB (ref 3.87–5.11)
RDW: 18.6 % — AB (ref 11.5–15.5)
WBC: 8.8 10*3/uL (ref 4.0–10.5)

## 2015-12-29 LAB — GLUCOSE, CAPILLARY
GLUCOSE-CAPILLARY: 106 mg/dL — AB (ref 65–99)
Glucose-Capillary: 117 mg/dL — ABNORMAL HIGH (ref 65–99)
Glucose-Capillary: 118 mg/dL — ABNORMAL HIGH (ref 65–99)
Glucose-Capillary: 128 mg/dL — ABNORMAL HIGH (ref 65–99)

## 2015-12-29 LAB — BASIC METABOLIC PANEL
ANION GAP: 4 — AB (ref 5–15)
BUN: 18 mg/dL (ref 6–20)
CALCIUM: 8.4 mg/dL — AB (ref 8.9–10.3)
CO2: 32 mmol/L (ref 22–32)
Chloride: 102 mmol/L (ref 101–111)
Creatinine, Ser: 0.94 mg/dL (ref 0.44–1.00)
GFR calc Af Amer: >60 mL/min (ref >60)
GLUCOSE: 134 mg/dL — AB (ref 65–99)
POTASSIUM: 4 mmol/L (ref 3.5–5.1)
SODIUM: 138 mmol/L (ref 135–145)

## 2015-12-29 LAB — MRSA PCR SCREENING: MRSA BY PCR: NEGATIVE

## 2015-12-29 MED ORDER — ALPRAZOLAM 0.5 MG PO TABS
0.5000 mg | ORAL_TABLET | Freq: Every evening | ORAL | Status: DC | PRN
  Administered 2015-12-29 – 2015-12-30 (×2): 1 mg via ORAL
  Filled 2015-12-29 (×2): qty 2

## 2015-12-29 NOTE — Progress Notes (Signed)
Pt refused cpap again tonight, stating she has mild sleep apnea and can't tolerate the mask.  Pt was encouraged to call should she change her mind.

## 2015-12-29 NOTE — Progress Notes (Signed)
Nurse and nurse tech went into patients room. Nurse tech assisted patient to bathroom to attempt to void. As patient attempted to void nurse and nurse tech moved regular bed out of room and moved bariatric bed into room. Nurse assisted patient to the side of the bed. Patient sat on left side of bed, patient explains to nurse she cannot get into bed due to height of bed and patient felt as if she was sliding off the edge. Nurse requested nurse tech to get more help to assist patient into bed. Patient stood with walker and sat on seat at the window until another nurse tech came to help. Patient sat on left side of bed again. Nurse and 2 nurse techs attempted to assist patient in lying down on bed. Patient refused to lay down explaining again that she was sliding off the edge of bed. Patient got up from side of bed with walker and nurse assisting patient. Patient returned to window seat. Patient requested nurse and nurse tech to bring regular bed back into room so patient could lay down. Nurse and nurse tech moved bariatric bed out of room and moved regular bed into room as patient requested. Nurse and nurse tech assembled bed with new linen and assisted patient back into bed. Nurse cleansed left leg with normal saline, patted leg dry and applied dressing as follows: xeroform (per charge nurse there is no white petrolatum gauze on floor and nurse should use xeroform), 4x4s, 2 Kerlix and 2 ace bandages. Nurse and nurse tech applied prevalon boots to both feet and assisted the patient in repositioning for comfort. Nurse has call bell in reach and requested Toradol for pain. Toradol was given and patient had no further needs at this time.

## 2015-12-29 NOTE — Progress Notes (Signed)
PROGRESS NOTE    Jasmine Huff  [id number] DOB: 04/20/1956 DOA: 12/28/2015 PCP: Lilian Coma, MD    Brief Narrative: Jasmine Huff is a 60 y.o. female with medical history significant of of morbid obesity, HTN, anxiety, chronic pain, OSA noncompliant with CPAP, RSL, DM type II; last admission 11-16-2015 for PNA, acute hypoxic respiratory failure, who presents with worsening Left Lower extremity swelling, redness. She was treated last month for cellulitis. She relates that never went away completely. She report worsening redness since 1 week ago. She saw PCP who started her on Keflex and lasix. She presents today with worsening leg pain and redness. She has been on antibiotics for 4- to 5 days .  She is still on oxygen since last admission. Has appointment follow up with pulmonology on Tuesday. She is not using CPAP, she lost her machine when she moved.    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  IV vancomycin.  Wound care consulted and recommendation appreciated. .  Blood culture pending  Doppler negative for DVT IV Toradol, monitor renal function. Avoid IV narcotic she gets sedated easy.  PT consult, patient might need rehab, she got anxious and was crying about the possibility of Rehab placement.   Depression;  Continue with Wellbutrin, lexapro, Latuda.   OSA; CPAP ordered. CM to help arrange CPAP for home.   Chronic Hypoxic Respiratory failure uncertain etiology, last admission treated for PNA> ,on2 L oxygen. supposed to follow up with Pulmonologist next Tuesday.  Chest x ray ; No active cardiopulmonary disease.Stable cardiac enlargement and vascular  congestion. Was started on lasix by PCP, will continue with lasix.   Chronic pain syndrome;  continue with morphine and oxycodone. Will need to be careful with oversedation,.   RA; continue with plaquenil.  DM; SSI. She has not been taking  glipizide.  CBG controlled.   DVT prophylaxis: Lovenox.  Code Status: Full code.  Family Communication: care discussed with patient.  Disposition Plan: To be determine    Consultants:   none  Procedures:   Doppler; negative.   Antimicrobials:   Vancomycin    Subjective: Still complaining of left leg pain.  She relates that she takes 0.5 mg xanax during the days as needed and is able to take 2 tablets of 0.5 mg at bed time.    Objective: Filed Vitals:   12/28/15 1530 12/28/15 2123 12/29/15 0438 12/29/15 0647  BP:  139/67 128/59 162/70  Pulse: 90 62 102 97  Temp: 99 F (37.2 C) 98.9 F (37.2 C) 99.3 F (37.4 C) 98.7 F (37.1 C)  TempSrc: Oral Oral Oral Oral  Resp: 18 20 16 16   Height:      Weight:      SpO2: 97% 94% 100% 100%    Intake/Output Summary (Last 24 hours) at 12/29/15 1239 Last data filed at 12/29/15 0440  Gross per 24 hour  Intake     40 ml  Output      0 ml  Net     40 ml   Filed Weights   12/28/15 1234  Weight: 159.213 kg (351 lb)    Examination:  General exam: Appears calm and comfortable  Respiratory  system: Clear to auscultation. Respiratory effort normal. Cardiovascular system: S1 & S2 heard, RRR. No JVD, murmurs, rubs, gallops or clicks. No pedal edema. Gastrointestinal system: Abdomen is nondistended, soft and nontender. No organomegaly or masses felt. Normal bowel sounds heard. Central nervous system: Alert and oriented. No focal neurological deficits. Extremities: Symmetric 5 x 5 power. Skin: No rashes, lesions or ulcers Psychiatry: Judgement and insight appear normal. Mood & affect appropriate.     Data Reviewed: I have personally reviewed following labs and imaging  studies  CBC:  Recent Labs Lab 12/28/15 1240 12/29/15 0401  WBC 9.3 8.8  NEUTROABS 5.6  --   HGB 9.6* 8.7*  HCT 32.0* 29.0*  MCV 80.2 79.2  PLT 382 [id number]   Basic Metabolic Panel:  Recent Labs Lab 12/28/15 1240 12/29/15 0401  NA 140 138  K 4.0 4.0  CL 101 102  CO2 33* 32  GLUCOSE 118* 134*  BUN 16 18  CREATININE 0.82 0.94  CALCIUM 8.6* 8.4*   GFR: Estimated Creatinine Clearance: 96.7 mL/min (by C-G formula based on Cr of 0.94). Liver Function Tests: No results for input(s): AST, ALT, ALKPHOS, BILITOT, PROT, ALBUMIN in the last 168 hours. No results for input(s): LIPASE, AMYLASE in the last 168 hours. No results for input(s): AMMONIA in the last 168 hours. Coagulation Profile: No results for input(s): INR, PROTIME in the last 168 hours. Cardiac Enzymes: No results for input(s): CKTOTAL, CKMB, CKMBINDEX, TROPONINI in the last 168 hours. BNP (last 3 results) No results for input(s): PROBNP in the last 8760 hours. HbA1C: No results for input(s): HGBA1C in the last 72 hours. CBG:  Recent Labs Lab 12/28/15 1624 12/28/15 2121 12/29/15 0712 12/29/15 1120  GLUCAP 107* 118* 117* 106*   Lipid Profile: No results for input(s): CHOL, HDL, LDLCALC, TRIG, CHOLHDL, LDLDIRECT in the last 72 hours. Thyroid Function Tests: No results for input(s): TSH, T4TOTAL, FREET4, T3FREE, THYROIDAB in the last 72 hours. Anemia Panel: No results for input(s): VITAMINB12, FOLATE, FERRITIN, TIBC, IRON, RETICCTPCT in the last 72 hours. Sepsis Labs: No results for input(s): PROCALCITON, LATICACIDVEN in the last 168 hours.  Recent Results (from the past 240 hour(s))  MRSA PCR Screening     Status: None   Collection Time: 12/29/15  3:43 AM  Result Value Ref Range Status   MRSA by PCR NEGATIVE NEGATIVE Final    Comment:        The GeneXpert MRSA Assay (FDA approved for NASAL specimens only), is one component of a comprehensive MRSA colonization surveillance program. It is not intended  to diagnose MRSA infection nor to guide or monitor treatment for MRSA infections.          Radiology Studies: Dg Chest 2 View  12/28/2015  CLINICAL DATA:  Short of breath for two weeks EXAM: CHEST  2 VIEW COMPARISON:  11/15/15 FINDINGS: Stable cardiomegaly. Mild vascular congestion. No edema or effusion. IMPRESSION: No active cardiopulmonary disease.Stable cardiac enlargement and vascular congestion. Electronically Signed   By: Skipper Cliche M.D.   On: 12/28/2015 15:17        Scheduled Meds: . buPROPion  450 mg Oral q morning - 10a  . cholecalciferol  5,000 Units Oral Daily  . enoxaparin (LOVENOX) injection  80 mg Subcutaneous Q24H  . escitalopram  40 mg Oral q morning - 10a  . furosemide  20 mg Oral Daily  . hydroxychloroquine  100 mg Oral QPM  . hydroxychloroquine  200 mg Oral q morning - 10a  . insulin aspart  0-9 Units Subcutaneous TID WC  . loratadine  10 mg Oral Daily  . lurasidone  160 mg Oral Q breakfast  . morphine  15 mg Oral BID  . multivitamins with iron  1 tablet Oral Daily  . nystatin cream  1 application Topical BID  . pantoprazole  40 mg Oral Daily  . rOPINIRole  4 mg Oral QHS  . sodium chloride flush  3 mL Intravenous Q12H  . vancomycin  1,250 mg Intravenous Q12H   Continuous Infusions:    LOS: 1 day    Time spent: 35 minutes.     Elmarie Shiley, MD [facility] Pager [phone]  If 7PM-7AM, please contact night-coverage [email or web address] Password [facility] 12/29/2015, 12:39 PM

## 2015-12-29 NOTE — Progress Notes (Signed)
Nurse changed patients dressing to left foot. Dressing being removed had minimal amount of serosanguinous fluid. Blister on plantar area of foot is clear with one black area. Blister on foot was completely black in appearance last night. Wound appears to be improved since yesterday. Redness of leg appears to have improved and swelling of leg has improved. Leg currently has minimal swelling in left leg. Nurse cleansed patients leg and foot with normal saline using sterile gauze. Nurse patted leg and foot dry. Nurse applied dressing as follows: two clear petrolatum gauze, folded in half, covered by 4x4s, kerlix and ace bandages. Dressing extends from toes to knee.

## 2015-12-30 DIAGNOSIS — J9611 Chronic respiratory failure with hypoxia: Secondary | ICD-10-CM

## 2015-12-30 DIAGNOSIS — I10 Essential (primary) hypertension: Secondary | ICD-10-CM

## 2015-12-30 DIAGNOSIS — L03116 Cellulitis of left lower limb: Principal | ICD-10-CM

## 2015-12-30 DIAGNOSIS — F329 Major depressive disorder, single episode, unspecified: Secondary | ICD-10-CM

## 2015-12-30 LAB — BASIC METABOLIC PANEL
Anion gap: 7 (ref 5–15)
BUN: 16 mg/dL (ref 6–20)
CHLORIDE: 100 mmol/L — AB (ref 101–111)
CO2: 31 mmol/L (ref 22–32)
Calcium: 8.5 mg/dL — ABNORMAL LOW (ref 8.9–10.3)
Creatinine, Ser: 0.93 mg/dL (ref 0.44–1.00)
GFR calc Af Amer: >60 mL/min (ref >60)
GFR calc non Af Amer: >60 mL/min (ref >60)
GLUCOSE: 118 mg/dL — AB (ref 65–99)
POTASSIUM: 3.9 mmol/L (ref 3.5–5.1)
Sodium: 138 mmol/L (ref 135–145)

## 2015-12-30 LAB — CBC
HCT: 29.7 % — ABNORMAL LOW (ref 36.0–46.0)
HEMOGLOBIN: 8.8 g/dL — AB (ref 12.0–15.0)
MCH: 24 pg — AB (ref 26.0–34.0)
MCHC: 29.6 g/dL — ABNORMAL LOW (ref 30.0–36.0)
MCV: 80.9 fL (ref 78.0–100.0)
Platelets: 341 10*3/uL (ref 150–400)
RBC: 3.67 MIL/uL — AB (ref 3.87–5.11)
RDW: 18.7 % — ABNORMAL HIGH (ref 11.5–15.5)
WBC: 7.9 10*3/uL (ref 4.0–10.5)

## 2015-12-30 LAB — GLUCOSE, CAPILLARY: Glucose-Capillary: 123 mg/dL — ABNORMAL HIGH (ref 65–99)

## 2015-12-30 MED ORDER — DOXYCYCLINE HYCLATE 100 MG PO TABS
100.0000 mg | ORAL_TABLET | Freq: Two times a day (BID) | ORAL | Status: DC
  Administered 2015-12-30 – 2015-12-31 (×3): 100 mg via ORAL
  Filled 2015-12-30 (×4): qty 1

## 2015-12-30 NOTE — Clinical Social Work Note (Signed)
Clinical Social Work Assessment  Patient Details  Name: Jasmine Huff MRN: [id number] Date of Birth: September 16, 1955  Date of referral:  12/30/15               Reason for consult:  Discharge Planning                Permission sought to share information with:    Permission granted to share information::     Name::        Agency::     Relationship::     Contact Information:     Housing/Transportation Living arrangements for the past 2 months:  Single Family Home Source of Information:  Patient Patient Interpreter Needed:  None Criminal Activity/Legal Involvement Pertinent to Current Situation/Hospitalization:  No - Comment as needed Significant Relationships:  Siblings Lives with:  Siblings Do you feel safe going back to the place where you live?   (agrees SNF would be best option) Need for family participation in patient care:  No (Coment)  Care giving concerns:  Pt understands she is not at baseline and needs to be more independent prior to return home.    Social Worker assessment / plan:  CSW met with pt at bedside. Pt reports she lives with her brother, Jasmine Huff who is best support. She indicates she came to ED due to worsening cellulitis. At baseline, pt ambulates with a walker and is independent with ADLs. PT evaluated pt and recommend SNF. CSW discussed placement process, including Medicare coverage/criteria. She states that only facility she will consider at this time is [facility]. CSW explained that other facilities may need to be considered if [facility] cannot offer and she understands. Pt states she was at SNF before for about 3 months. She said she would never agree to stay that long again, but will go for very short term.   Employment status:  Disabled (Comment on whether or not currently receiving Disability) Insurance information:  Medicare PT Recommendations:  [place] / Referral to community resources:  [place]  Patient/Family's Response to care:  Pt aware of PT recommendation and agrees to SNF.   Patient/Family's Understanding of and Emotional Response to Diagnosis, Current Treatment, and Prognosis:  Pt became tearful while discussing SNF as she shared that she had a bad experience before at rehab. She does not want to elaborate, but is agreeable to short term SNF at [facility]. Brief support provided.    Emotional Assessment Appearance:  Appears stated age Attitude/Demeanor/Rapport:  Other (Cooperative) Affect (typically observed):  Tearful/Crying Orientation:  Oriented to Self, Oriented to Place, Oriented to  Time, Oriented to Situation Alcohol / Substance use:  Not Applicable Psych involvement (Current and /or in the community):  No (Comment)  Discharge Needs  Concerns to be addressed:  Discharge Planning Concerns Readmission within the last 30 days:  No Current discharge risk:  Physical Impairment Barriers to Discharge:  Continued Medical Work up   Salome Arnt, LCSW 12/30/2015, 2:55 PM [phone]

## 2015-12-30 NOTE — NC FL2 (Signed)
[place] LEVEL OF CARE SCREENING TOOL     IDENTIFICATION  Patient Name: Jasmine Huff Birthdate: 02-17-1956 Sex: female Admission Date (Current Location): 12/28/2015  [facility] and [place] Number:  Herbalist and Address:  [facility],  [place] [address] [place]      Provider Number: [id number]  Attending Physician Name and Address:  Bonnielee Haff, MD  Relative Name and Phone Number:       Current Level of Care: Hospital Recommended Level of Care: [place] Prior Approval Number:    Date Approved/Denied:   PASRR Number:    Discharge Plan: SNF    Current Diagnoses: Patient Active Problem List   Diagnosis Date Noted  . Cellulitis 12/28/2015  . Respiratory failure, chronic ([place]) 12/28/2015  . Diabetes mellitus due to underlying condition with hyperglycemia ([place]) 12/28/2015  . 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 Acute dyspnea   . HCAP (healthcare-associated pneumonia)   . Drowsiness   . Morbid obesity ([place])   . Hypoxia 11/13/2015  . Acute on chronic respiratory failure with hypoxia ([place]) 11/13/2015  . Altered mental status 11/08/2015  . Left leg cellulitis 11/08/2015  . Fungal infection of skin of abdomen 11/08/2015  . S/P shoulder replacement 09/27/2015  . Anemia 09/13/2012  . OSA (obstructive sleep apnea) 03/24/2012  . Diabetes mellitus ([place]) 02/23/2012  . Severe episode of recurrent major depressive disorder ([place]) 09/25/2011  . Hyperglycemia 01/21/2011  . GERD (gastroesophageal reflux disease) 01/21/2011  . Hypertension 10/23/2010  . Fibromyalgia 10/23/2010  . Chronic pain 10/23/2010  . Urinary frequency 10/23/2010  . Other and unspecified hyperlipidemia 10/23/2010  . Obesity 10/23/2010  . Insomnia 10/23/2010  . Depression 10/23/2010    Orientation RESPIRATION BLADDER Height & Weight     Self, Time, Situation, Place  O2 (2 L) Continent Weight:  (!) 355 lb 2.6 oz (161.1 kg) Height:  5\' 3"  (160 cm)  BEHAVIORAL SYMPTOMS/MOOD NEUROLOGICAL BOWEL NUTRITION STATUS  Other (Comment) (n/a)  (n/a) Continent Diet (Heart healthy)  AMBULATORY STATUS COMMUNICATION OF NEEDS Skin   Limited Assist Verbally Other (Comment) (Cellulitis left lower leg. Rash left leg.)                       Personal Care Assistance Level of Assistance  Bathing, Feeding, Dressing Bathing Assistance: Limited assistance Feeding assistance: Limited assistance Dressing Assistance: Limited assistance     Functional Limitations Info  Sight, Hearing, Speech Sight Info: Adequate Hearing Info: Adequate Speech Info: Adequate    SPECIAL CARE FACTORS FREQUENCY  PT (By licensed PT), OT (By licensed OT)     PT Frequency: 5 OT Frequency: 5            Contractures Contractures Info: Not present    Additional Factors Info  Psychotropic Code Status Info: Full code Allergies Info: Asenapine, Saphris, Seroquel, Iodinated Diagnostic Agents, Pregabalin, Buprenorphine.  Psychotropic Info: Latuda, Wellbutrin, Lexapro.          Current Medications (12/30/2015):  This is the current hospital active medication list Current Facility-Administered Medications  Medication Dose Route Frequency Provider Last Rate Last Dose  . 0.9 %  sodium chloride infusion  250 mL Intravenous PRN Belkys A Regalado, MD 10 mL/hr at 12/29/15 1330 250 mL at 12/29/15 1330  . ALPRAZolam Duanne Moron) tablet 0.5 mg  0.5 mg Oral Daily PRN Belkys A Regalado, MD   0.5 mg at 12/30/15 1026  . ALPRAZolam Duanne Moron) tablet 0.5-1  mg  0.5-1 mg Oral QHS PRN Belkys A Regalado, MD   1 mg at 12/29/15 2152  . buPROPion (WELLBUTRIN XL) 24 hr tablet 450 mg  450 mg Oral q morning - 10a Belkys A Regalado, MD   450 mg at 12/30/15 1027  . cholecalciferol (VITAMIN D) tablet 5,000 Units  5,000 Units Oral Daily Belkys A Regalado, MD   5,000 Units at 12/30/15 1027  . doxycycline (VIBRA-TABS) tablet 100 mg  100 mg Oral Q12H Bonnielee Haff, MD   100 mg at 12/30/15 1124  . enoxaparin (LOVENOX) injection 80 mg  80 mg Subcutaneous Q24H Belkys A Regalado, MD   80 mg at 12/29/15 2153  . escitalopram (LEXAPRO) tablet 40 mg  40 mg Oral q morning - 10a Belkys A Regalado, MD   40 mg at 12/30/15 1026  . furosemide (LASIX) tablet 20 mg  20 mg Oral Daily Belkys A Regalado, MD   20 mg at 12/30/15 1026  . hydroxychloroquine (PLAQUENIL) tablet 100 mg  100 mg Oral QPM Belkys A Regalado, MD   100 mg at 12/29/15 1809  . hydroxychloroquine (PLAQUENIL) tablet 200 mg  200 mg Oral q morning - 10a Belkys A Regalado, MD   200 mg at 12/30/15 1026  . loratadine (CLARITIN) tablet 10 mg  10 mg Oral Daily Belkys A Regalado, MD   10 mg at 12/30/15 1026  . lurasidone (LATUDA) tablet 160 mg  160 mg Oral Q breakfast Belkys A Regalado, MD   160 mg at 12/30/15 0836  . morphine (MS CONTIN) 12 hr tablet 15 mg  15 mg Oral BID Belkys A Regalado, MD   15 mg at 12/30/15 1026  . multivitamins with iron tablet 1 tablet  1 tablet Oral Daily Belkys A Regalado, MD   1 tablet at 12/30/15 1412  . nystatin cream (MYCOSTATIN) 1 application  1 application Topical BID Belkys A Regalado, MD   1 application at [phone]  . ondansetron (ZOFRAN) tablet 4 mg  4 mg Oral Q6H PRN Belkys A Regalado, MD       Or  . ondansetron (ZOFRAN) injection 4 mg  4 mg Intravenous Q6H PRN Belkys A Regalado, MD      . oxyCODONE (Oxy IR/ROXICODONE) immediate release tablet 5-10 mg  5-10 mg Oral Q6H PRN Belkys A Regalado, MD   10 mg at 12/30/15 1411  . pantoprazole (PROTONIX) EC tablet 40 mg  40 mg Oral Daily Belkys A Regalado, MD   40 mg at 12/30/15 1026  . polyethylene glycol (MIRALAX / GLYCOLAX) packet 17 g  17 g Oral Daily PRN Belkys A Regalado, MD   17 g at 12/30/15 1025  . rOPINIRole (REQUIP) tablet 4 mg  4 mg Oral QHS Belkys A Regalado, MD   4 mg at 12/29/15 2152  . sodium chloride flush (NS) 0.9 % injection 3 mL  3 mL Intravenous Q12H Belkys A Regalado, MD   3 mL at 12/28/15 2217  .  sodium chloride flush (NS) 0.9 % injection 3 mL  3 mL Intravenous PRN Belkys A Regalado, MD         Discharge Medications: Please see discharge summary for a list of discharge medications.  Relevant Imaging Results:  Relevant Lab Results:   Additional Information 3.13.17 Surgical pcr +. SS#: [id number]  Salome Arnt, [place]

## 2015-12-30 NOTE — Progress Notes (Signed)
PROGRESS NOTE    Jasmine Huff  [id number] DOB: 02-02-1956 DOA: 12/28/2015 PCP: Lilian Coma, MD    Brief Narrative: Jasmine Huff is a 60 y.o. female with medical history significant of of morbid obesity, HTN, anxiety, chronic pain, OSA noncompliant with CPAP, RSL, DM type II; last admission 11-16-2015 for PNA, acute hypoxic respiratory failure, who presented with worsening Left Lower extremity swelling, redness. She was treated last month for cellulitis. She relates that never went away completely.    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 She was placed on intravenous vancomycin. Dressing was removed from the left leg. Erythema is improved. She does have a blister in the heel area, which also appears to be better. There is some discoloration noted in that area. Her WBC is normal. Doppler studies was negative for DVT. Blood cultures are negative so far. Change to oral doxycycline. Local wound care. Mobilize as tolerated.  Urinary retention Mobilize and attempt voiding trial later today.  Depression Continue with Wellbutrin, lexapro, Latuda.   OSA CPAP ordered. CM to help arrange CPAP for home. Patient has an appointment with pulmonologist on Tuesday.  Chronic Hypoxic Respiratory failure uncertain etiology, last admission treated for PNA She is on home oxygen. Supposed to follow up with Pulmonologist on Tuesday. No acute process noted on chest x-ray. Continue diuretics.  Chronic pain syndrome Continue with morphine and oxycodone. Will need to be careful with oversedation.   History of rheumatoid arthritis  Continue with plaquenil.  Questionable  history of diabetes mellitus. Patient tells me that she does not have diabetes. Apparently was prescribed glipizide during previous hospitalization, but patient has not taken this medication. CBGs are well controlled. She is requesting that her diet be changed and that we stop checking her CBGs. We will do so. However, her HbA1c was 7 in May. She may discuss this further with her PCP.  Normocytic anemia. Hemoglobin is stable and appears to be close to baseline. No overt bleeding. Continue to monitor.  DVT prophylaxis: Lovenox.  Code Status: Full code.  Family Communication: Discussed with patient.  Disposition Plan: PT to evaluate. Anticipate discharge tomorrow.   Consultants:   none  Procedures:   Doppler; negative.   Antimicrobials:   Vancomycin changed to doxycycline on 6/25   Subjective: Patient feels better. Pain in the left leg has improved. Swelling is gone down. Denies any other symptoms at this time. Does not want to go to rehabilitation if possible.    Objective: Filed Vitals:   12/29/15 1508 12/29/15 2050 12/30/15 0505 12/30/15 0621  BP: 123/61 128/52 128/61   Pulse: 94 87 88   Temp: 98.8 F (37.1 C) 99.3 F (37.4 C) 98.3 F (36.8 C)   TempSrc: Oral Oral Oral   Resp: 16 16 16    Height:      Weight:    161.1 kg (355 lb 2.6 oz)  SpO2: 100% 100% 98%     Intake/Output Summary (Last 24 hours) at 12/30/15 1120 Last data filed at 12/30/15 1103  Gross per 24 hour  Intake 1931.16 ml  Output   1450 ml  Net 481.16 ml   Filed Weights   12/28/15 1234 12/30/15 [id number]  Weight: 159.213 kg (351 lb) 161.1 kg (355 lb 2.6 oz)    Examination:  General exam: Appears calm and comfortable  Respiratory system: Clear to auscultation.  Cardiovascular system: S1 & S2 Normal, regular. Systolic murmur appreciated over the precordium. No pedal edema. Gastrointestinal system: Abdomen is nondistended, soft and nontender. No organomegaly or masses felt. Normal bowel sounds  heard. Central nervous system: Alert and oriented. No focal neurological deficits. Extremities: Blister in the left heel has collapsed. Erythema in the left lower leg has improved. No active drainage is noted. She does have good pulses.    Data Reviewed: I have personally reviewed following labs and imaging studies  CBC:  Recent Labs Lab 12/28/15 1240 12/29/15 0401 12/30/15 0439  WBC 9.3 8.8 7.9  NEUTROABS 5.6  --   --   HGB 9.6* 8.7* 8.8*  HCT 32.0* 29.0* 29.7*  MCV 80.2 79.2 80.9  PLT 382 327 [id number]   Basic Metabolic Panel:  Recent Labs Lab 12/28/15 1240 12/29/15 0401 12/30/15 0439  NA 140 138 138  K 4.0 4.0 3.9  CL 101 102 100*  CO2 33* 32 31  GLUCOSE 118* 134* 118*  BUN 16 18 16   CREATININE 0.82 0.94 0.93  CALCIUM 8.6* 8.4* 8.5*   CBG:  Recent Labs Lab 12/29/15 0712 12/29/15 1120 12/29/15 1701 12/29/15 2054 12/30/15 0741  GLUCAP 117* 106* 118* 128* 123*     Recent Results (from the past 240 hour(s))  Culture, blood (Routine X 2) w Reflex to ID Panel     Status: None (Preliminary result)   Collection Time: 12/28/15 12:40 PM  Result Value Ref Range Status   Specimen Description BLOOD BLOOD LEFT FOREARM  Final   Special Requests BOTTLES DRAWN AEROBIC AND ANAEROBIC 5CC  Final   Culture   Final    NO GROWTH 1 DAY Performed at [facility]    Report Status PENDING  Incomplete  Culture, blood (Routine X 2) w Reflex to ID Panel     Status: None (Preliminary result)   Collection Time: 12/28/15 12:50 PM  Result Value Ref Range Status   Specimen Description BLOOD BLOOD RIGHT FOREARM  Final   Special Requests BOTTLES DRAWN AEROBIC AND ANAEROBIC 5CC  Final   Culture   Final    NO GROWTH 1 DAY Performed at [facility]    Report Status PENDING  Incomplete  MRSA PCR Screening     Status: None   Collection Time: 12/29/15  3:43 AM  Result Value Ref Range Status   MRSA by PCR NEGATIVE NEGATIVE Final    Comment:        The GeneXpert MRSA Assay  (FDA approved for NASAL specimens only), is one component of a comprehensive MRSA colonization surveillance program. It is not intended to diagnose MRSA infection nor to guide or monitor treatment for MRSA infections.       Radiology Studies: Dg Chest 2 View  12/28/2015  CLINICAL DATA:  Short of breath for two weeks EXAM: CHEST  2 VIEW COMPARISON:  11/15/15 FINDINGS: Stable cardiomegaly. Mild vascular congestion. No edema or effusion. IMPRESSION: No active cardiopulmonary disease.Stable cardiac enlargement and vascular congestion. Electronically Signed   By: Skipper Cliche M.D.   On: 12/28/2015 15:17    Scheduled Meds: . buPROPion  450 mg Oral q morning - 10a  . cholecalciferol  5,000 Units Oral Daily  . doxycycline  100 mg Oral Q12H  . enoxaparin (LOVENOX) injection  80 mg Subcutaneous Q24H  . escitalopram  40 mg Oral q morning - 10a  . furosemide  20 mg Oral Daily  . hydroxychloroquine  100 mg Oral QPM  . hydroxychloroquine  200 mg Oral q morning - 10a  . loratadine  10 mg Oral Daily  . lurasidone  160 mg Oral Q breakfast  . morphine  15 mg Oral BID  . multivitamins with iron  1 tablet Oral Daily  . nystatin cream  1 application Topical BID  . pantoprazole  40 mg Oral Daily  . rOPINIRole  4 mg Oral QHS  . sodium chloride flush  3 mL Intravenous Q12H   Continuous Infusions:    LOS: 2 days      Bonnielee Haff, MD [facility] Pager [phone]  If 7PM-7AM, please contact night-coverage [email or web address] Password [email or web address] 12/30/2015, 11:20 AM

## 2015-12-30 NOTE — Clinical Social Work Placement (Signed)
   CLINICAL SOCIAL WORK PLACEMENT  NOTE  Date:  12/30/2015  Patient Details  Name: Addalyne Ayotte MRN: [id number] Date of Birth: 11-10-1955  Clinical Social Work is seeking post-discharge placement for this patient at the [place] level of care (*CSW will initial, date and re-position this form in  chart as items are completed):  Yes   Patient/family provided with [place] Work Department's list of facilities offering this level of care within the geographic area requested by the patient (or if unable, by the patient's family).  Yes   Patient/family informed of their freedom to choose among providers that offer the needed level of care, that participate in Medicare, Medicaid or managed care program needed by the patient, have an available bed and are willing to accept the patient.  Yes   Patient/family informed of [facility]'s ownership interest in [facility] and [facility], as well as of the fact that they are under no obligation to receive care at these facilities.  PASRR submitted to EDS on 12/30/15     PASRR number received on       Existing PASRR number confirmed on       FL2 transmitted to all facilities in geographic area requested by pt/family on 12/30/15     FL2 transmitted to all facilities within larger geographic area on       Patient informed that his/her managed care company has contracts with or will negotiate with certain facilities, including the following:            Patient/family informed of bed offers received.  Patient chooses bed at       Physician recommends and patient chooses bed at      Patient to be transferred to   on  .  Patient to be transferred to facility by       Patient family notified on   of transfer.  Name of family member notified:        PHYSICIAN       Additional Comment:    _______________________________________________ Salome Arnt, LCSW 12/30/2015, 2:45  PM [phone]

## 2015-12-30 NOTE — Progress Notes (Signed)
Pt refused cpap, but was encouraged to call should she change her mind.

## 2015-12-30 NOTE — Evaluation (Signed)
Physical Therapy Evaluation Patient Details Name: Jasmine Huff MRN: [id number] DOB: 1955-07-31 Today's Date: 12/30/2015   History of Present Illness  Jasmine Huff is a 60 y.o. female with medical history significant of of morbid obesity, HTN, anxiety, chronic pain, OSA noncompliant with CPAP, RSL, DM type II; last admission 11-16-2015 for PNA, acute hypoxic respiratory failure, who presents with worsening Left Lower extremity swelling, redness. She was treated last month for cellulitis. She relates that never went away completely. She report worsening redness since 1 week ago. She saw PCP who started her on Keflex and lasix. Pt admitted through ED 12/28/15 with worsening leg pain and redness  Clinical Impression  Pt admitted as above and presenting with functional mobility limitations 2* generalized weakness, chronic and acute pain, ltd WB tolerance on L LE and poor endurance.  Pt would benefit from follow up rehab at SNF level to maximize IND and safety prior to return home with ltd assist.    Follow Up Recommendations SNF    Equipment Recommendations  None recommended by PT    Recommendations for Other Services OT consult     Precautions / Restrictions Precautions Precautions: Fall Precaution Comments: Pt has blister on plantar aspect of L foot Restrictions Weight Bearing Restrictions: No      Mobility  Bed Mobility Overal bed mobility: Needs Assistance;+2 for physical assistance;+ 2 for safety/equipment Bed Mobility: Supine to Sit;Sit to Supine     Supine to sit: Mod assist;HOB elevated Sit to supine: Mod assist;+2 for physical assistance   General bed mobility comments: Pt assisted with LEs in/out of bed - states her brother assists her at home.  Total assist to move pt up in bed  Transfers Overall transfer level: Needs assistance Equipment used: Rolling walker (2 wheeled) Transfers: Sit to/from Stand Sit to Stand: Min assist;+2 physical assistance;+2  safety/equipment;From elevated surface         General transfer comment: cues for transition position and use of UEs   Ambulation/Gait Ambulation/Gait assistance: Min assist;+2 safety/equipment Ambulation Distance (Feet): 6 Feet Assistive device: Rolling walker (2 wheeled) Gait Pattern/deviations: Step-to pattern;Decreased step length - right;Decreased step length - left;Shuffle;Trunk flexed Gait velocity: decr Gait velocity interpretation: Below normal speed for age/gender General Gait Details: Pt stepped back to bed and up side of bed with RW and min assist for balance/saftey.  Ltd by increased pain with WB on L LE  Stairs            Wheelchair Mobility    Modified Rankin (Stroke Patients Only)       Balance Overall balance assessment: Needs assistance Sitting-balance support: No upper extremity supported;Feet supported Sitting balance-Leahy Scale: Normal     Standing balance support: Bilateral upper extremity supported Standing balance-Leahy Scale: Fair                               Pertinent Vitals/Pain Pain Assessment: 0-10 Pain Score: 7  Pain Location: L foot with WB Pain Descriptors / Indicators: Sore Pain Intervention(s): Limited activity within patient's tolerance;Monitored during session    Home Living Family/patient expects to be discharged to:: Private residence Living Arrangements: Other relatives Available Help at Discharge: Family;Available 24 hours/day Type of Home: House Home Access: Stairs to enter   [employer] of Steps: 1 Home Layout: One level Home Equipment: Shower seat;Hand held shower head;Grab bars - tub/shower;Walker - 4 wheels      Prior Function Level of Independence: Independent with assistive  device(s)         Comments: used walker     Hand Dominance   Dominant Hand: Right    Extremity/Trunk Assessment   Upper Extremity Assessment: Generalized weakness;Defer to OT evaluation            Lower Extremity Assessment: Generalized weakness;RLE deficits/detail;LLE deficits/detail RLE Deficits / Details: Ltd flexion Bil knees - pt states "I need both knees replaced"       Communication   Communication: No difficulties  Cognition Arousal/Alertness: Awake/alert Behavior During Therapy: WFL for tasks assessed/performed Overall Cognitive Status: Within Functional Limits for tasks assessed                      General Comments      Exercises        Assessment/Plan    PT Assessment Patient needs continued PT services  PT Diagnosis Difficulty walking   PT Problem List Decreased strength;Decreased range of motion;Decreased activity tolerance;Decreased balance;Decreased mobility;Decreased knowledge of use of DME;Obesity;Pain  PT Treatment Interventions DME instruction;Gait training;Stair training;Functional mobility training;Therapeutic activities;Therapeutic exercise;Balance training;Patient/family education   PT Goals (Current goals can be found in the Care Plan section) Acute Rehab PT Goals Patient Stated Goal: Get my whole body replaced PT Goal Formulation: With patient Time For Goal Achievement: 01/12/16 Potential to Achieve Goals: Fair    Frequency Min 3X/week   Barriers to discharge Decreased caregiver support      Co-evaluation               End of Session   Activity Tolerance: Patient limited by pain Patient left: in bed;with call bell/phone within reach;with family/visitor present Nurse Communication: Mobility status         Time: [id number] PT Time Calculation (min) (ACUTE ONLY): 29 min   Charges:   PT Evaluation $PT Eval Low Complexity: 1 Procedure     PT G Codes:        Jasmine Huff 01/22/16, 12:08 PM

## 2015-12-30 NOTE — Evaluation (Signed)
Occupational Therapy Evaluation Patient Details Name: Jasmine Huff MRN: [id number] DOB: 06-Feb-1956 Today's Date: 12/30/2015    History of Present Illness Jasmine Huff is a 60 y.o. female with medical history significant of of morbid obesity, HTN, anxiety, chronic pain, OSA noncompliant with CPAP, RSL, DM type II; last admission 11-16-2015 for PNA, acute hypoxic respiratory failure, who presents with worsening Left Lower extremity swelling, redness. She was treated last month for cellulitis. She relates that never went away completely. She report worsening redness since 1 week ago. She saw PCP who started her on Keflex and lasix. Pt admitted through ED 12/28/15 with worsening leg pain and redness   Clinical Impression   Pt admitted with celluliitis. Pt currently with functional limitations due to the deficits listed below (see OT Problem List).  Pt will benefit from skilled OT to increase their safety and independence with ADL and functional mobility for ADL to facilitate discharge to venue listed below.      Follow Up Recommendations  SNF    Equipment Recommendations  None recommended by OT       Precautions / Restrictions Precautions Precautions: Fall Precaution Comments: Pt has blister on plantar aspect of L foot Restrictions Weight Bearing Restrictions: No      Mobility Bed Mobility Overal bed mobility: Needs Assistance;+2 for physical assistance;+ 2 for safety/equipment Bed Mobility: Supine to Sit;Sit to Supine     Supine to sit: Mod assist;HOB elevated Sit to supine: Mod assist;+2 for physical assistance   General bed mobility comments: Pt assisted with LEs in/out of bed - states her brother assists her at home.  Total assist to move pt up in bed  Transfers Overall transfer level: Needs assistance Equipment used: Rolling walker (2 wheeled) Transfers: Sit to/from Stand Sit to Stand: Min assist;+2 physical assistance;+2 safety/equipment;From elevated surface          General transfer comment: cues for transition position and use of UEs     Balance Overall balance assessment: Needs assistance Sitting-balance support: No upper extremity supported;Feet supported Sitting balance-Leahy Scale: Normal     Standing balance support: Bilateral upper extremity supported Standing balance-Leahy Scale: Fair                              ADL                Pt will need significant A with ADL activity.  Pt not able to perform this day                                         Pertinent Vitals/Pain Pain Assessment: 0-10 Pain Score: 7  Pain Location: L foot with WB Pain Descriptors / Indicators: Sore Pain Intervention(s): Limited activity within patient's tolerance     Hand Dominance Right   Extremity/Trunk Assessment Upper Extremity Assessment Upper Extremity Assessment: Generalized weakness (pt had shoulder sx by Dr Driscilla Moats in march. Pt reports perfirming HEP)   Lower Extremity Assessment Lower Extremity Assessment: Generalized weakness;RLE deficits/detail;LLE deficits/detail RLE Deficits / Details: Ltd flexion Bil knees - pt states "I need both knees replaced"       Communication Communication Communication: No difficulties   Cognition Arousal/Alertness: Awake/alert Behavior During Therapy: WFL for tasks assessed/performed Overall Cognitive Status: Within Functional Limits for tasks assessed  Home Living Family/patient expects to be discharged to:: Private residence Living Arrangements: Other relatives Available Help at Discharge: Family;Available 24 hours/day Type of Home: House Home Access: Stairs to enter [employer] of Steps: 1   Home Layout: One level     Bathroom Shower/Tub: Occupational psychologist: Handicapped height     Home Equipment: [place] held shower head;Grab bars - tub/shower;Walker - 4 wheels           Prior Functioning/Environment Level of Independence: Independent with assistive device(s)        Comments: used walker    OT Diagnosis: Generalized weakness;Acute pain   OT Problem List: Decreased strength;Decreased activity tolerance;Decreased safety awareness   OT Treatment/Interventions: Self-care/ADL training;Patient/family education    OT Goals(Current goals can be found in the care plan section) Acute Rehab OT Goals Patient Stated Goal: Get my whole body replaced OT Goal Formulation: With patient Time For Goal Achievement: 01/13/16  OT Frequency: Min 2X/week   Barriers to D/C: Decreased caregiver support             End of Session Nurse Communication: Mobility status  Activity Tolerance: No increased pain Patient left: in bed;with call bell/phone within reach   Time: 1003-1055 OT Time Calculation (min): 52 min Charges:  OT General Charges $OT Visit: 1 Procedure OT Evaluation $OT Eval Moderate Complexity: 1 Procedure OT Treatments $Self Care/Home Management : 23-37 mins G-Codes:    Payton Mccallum D 01/10/2016, 12:24 PM

## 2015-12-31 DIAGNOSIS — G2581 Restless legs syndrome: Secondary | ICD-10-CM | POA: Diagnosis not present

## 2015-12-31 DIAGNOSIS — Z79899 Other long term (current) drug therapy: Secondary | ICD-10-CM | POA: Diagnosis not present

## 2015-12-31 DIAGNOSIS — G4733 Obstructive sleep apnea (adult) (pediatric): Secondary | ICD-10-CM | POA: Diagnosis not present

## 2015-12-31 DIAGNOSIS — M6281 Muscle weakness (generalized): Secondary | ICD-10-CM | POA: Diagnosis not present

## 2015-12-31 DIAGNOSIS — G8929 Other chronic pain: Secondary | ICD-10-CM | POA: Diagnosis not present

## 2015-12-31 DIAGNOSIS — L03116 Cellulitis of left lower limb: Secondary | ICD-10-CM | POA: Diagnosis not present

## 2015-12-31 DIAGNOSIS — J9601 Acute respiratory failure with hypoxia: Secondary | ICD-10-CM | POA: Diagnosis not present

## 2015-12-31 DIAGNOSIS — L89620 Pressure ulcer of left heel, unstageable: Secondary | ICD-10-CM | POA: Diagnosis not present

## 2015-12-31 DIAGNOSIS — M069 Rheumatoid arthritis, unspecified: Secondary | ICD-10-CM | POA: Diagnosis not present

## 2015-12-31 DIAGNOSIS — E088 Diabetes mellitus due to underlying condition with unspecified complications: Secondary | ICD-10-CM | POA: Diagnosis not present

## 2015-12-31 DIAGNOSIS — D638 Anemia in other chronic diseases classified elsewhere: Secondary | ICD-10-CM | POA: Diagnosis not present

## 2015-12-31 DIAGNOSIS — G894 Chronic pain syndrome: Secondary | ICD-10-CM | POA: Diagnosis not present

## 2015-12-31 DIAGNOSIS — J302 Other seasonal allergic rhinitis: Secondary | ICD-10-CM | POA: Diagnosis not present

## 2015-12-31 DIAGNOSIS — K219 Gastro-esophageal reflux disease without esophagitis: Secondary | ICD-10-CM | POA: Diagnosis not present

## 2015-12-31 DIAGNOSIS — L0390 Cellulitis, unspecified: Secondary | ICD-10-CM | POA: Diagnosis not present

## 2015-12-31 DIAGNOSIS — M1990 Unspecified osteoarthritis, unspecified site: Secondary | ICD-10-CM | POA: Diagnosis not present

## 2015-12-31 DIAGNOSIS — E785 Hyperlipidemia, unspecified: Secondary | ICD-10-CM | POA: Diagnosis not present

## 2015-12-31 DIAGNOSIS — F339 Major depressive disorder, recurrent, unspecified: Secondary | ICD-10-CM | POA: Diagnosis not present

## 2015-12-31 DIAGNOSIS — I10 Essential (primary) hypertension: Secondary | ICD-10-CM | POA: Diagnosis not present

## 2015-12-31 DIAGNOSIS — S91309A Unspecified open wound, unspecified foot, initial encounter: Secondary | ICD-10-CM | POA: Diagnosis not present

## 2015-12-31 MED ORDER — MORPHINE SULFATE ER 15 MG PO TBCR: 15.0000 mg | EXTENDED_RELEASE_TABLET | Freq: Two times a day (BID) | ORAL | Status: AC

## 2015-12-31 MED ORDER — DOXYCYCLINE HYCLATE 100 MG PO TABS: 100.0000 mg | ORAL_TABLET | Freq: Two times a day (BID) | ORAL | Status: AC

## 2015-12-31 MED ORDER — OXYCODONE HCL 5 MG PO TABS: 5.0000 mg | ORAL_TABLET | Freq: Three times a day (TID) | ORAL | Status: AC | PRN

## 2015-12-31 MED ORDER — ALPRAZOLAM 0.5 MG PO TABS: 0.5000 mg | ORAL_TABLET | Freq: Every day | ORAL | Status: AC | PRN

## 2015-12-31 MED ORDER — SENNOSIDES-DOCUSATE SODIUM 8.6-50 MG PO TABS: 1.0000 | ORAL_TABLET | Freq: Two times a day (BID) | ORAL | Status: AC

## 2015-12-31 MED ORDER — SENNOSIDES-DOCUSATE SODIUM 8.6-50 MG PO TABS
1.0000 | ORAL_TABLET | Freq: Two times a day (BID) | ORAL | Status: DC
  Administered 2015-12-31: 1 via ORAL
  Filled 2015-12-31: qty 1

## 2015-12-31 MED ORDER — POLYETHYLENE GLYCOL 3350 17 G PO PACK: 17.0000 g | PACK | Freq: Two times a day (BID) | ORAL | Status: DC

## 2015-12-31 NOTE — Discharge Instructions (Signed)
Wound Care: Wound care to left foot, plantar aspect serum filled blister (full thickness):  Cleanse with NS, patr gently dry.  Cover affected area with two pieces of  folded white petrolatum gauze. Top with dry gauze 4x4s, secure with Kerlix roll gauze wrapped from toes to knee.  Top with application of ACE bandage (6-inch) wrapped in an identical fashion.   Cellulitis Cellulitis is an infection of the skin and the tissue beneath it. The infected area is usually red and tender. Cellulitis occurs most often in the arms and lower legs.  CAUSES  Cellulitis is caused by bacteria that enter the skin through cracks or cuts in the skin. The most common types of bacteria that cause cellulitis are staphylococci and streptococci. SIGNS AND SYMPTOMS   Redness and warmth.  Swelling.  Tenderness or pain.  Fever. DIAGNOSIS  Your health care provider can usually determine what is wrong based on a physical exam. Blood tests may also be done. TREATMENT  Treatment usually involves taking an antibiotic medicine. HOME CARE INSTRUCTIONS   Take your antibiotic medicine as directed by your health care provider. Finish the antibiotic even if you start to feel better.  Keep the infected arm or leg elevated to reduce swelling.  Apply a warm cloth to the affected area up to 4 times per day to relieve pain.  Take medicines only as directed by your health care provider.  Keep all follow-up visits as directed by your health care provider. SEEK MEDICAL CARE IF:   You notice red streaks coming from the infected area.  Your red area gets larger or turns dark in color.  Your bone or joint underneath the infected area becomes painful after the skin has healed.  Your infection returns in the same area or another area.  You notice a swollen bump in the infected area.  You develop new symptoms.  You have a fever. SEEK IMMEDIATE MEDICAL CARE IF:   You feel very sleepy.  You develop vomiting or  diarrhea.  You have a general ill feeling (malaise) with muscle aches and pains.   This information is not intended to replace advice given to you by your health care provider. Make sure you discuss any questions you have with your health care provider.   Document Released: 04/02/2005 Document Revised: 03/14/2015 Document Reviewed: 09/08/2011 Elsevier Interactive Patient Education [employer].

## 2015-12-31 NOTE — Clinical Social Work Placement (Signed)
   CLINICAL SOCIAL WORK PLACEMENT  NOTE  Date:  12/31/2015  Patient Details  Name: Jasmine Huff MRN: [id number] Date of Birth: 1956/04/24  Clinical Social Work is seeking post-discharge placement for this patient at the [place] level of care (*CSW will initial, date and re-position this form in  chart as items are completed):  Yes   Patient/family provided with [place] Work Department's list of facilities offering this level of care within the geographic area requested by the patient (or if unable, by the patient's family).  Yes   Patient/family informed of their freedom to choose among providers that offer the needed level of care, that participate in Medicare, Medicaid or managed care program needed by the patient, have an available bed and are willing to accept the patient.  Yes   Patient/family informed of [facility]'s ownership interest in [facility] and [facility], as well as of the fact that they are under no obligation to receive care at these facilities.  PASRR submitted to EDS on 12/30/15     PASRR number received on       Existing PASRR number confirmed on       FL2 transmitted to all facilities in geographic area requested by pt/family on 12/31/15     FL2 transmitted to all facilities within larger geographic area on       Patient informed that his/her managed care company has contracts with or will negotiate with certain facilities, including the following:        Yes   Patient/family informed of bed offers received.  Patient chooses bed at [facility]     Physician recommends and patient chooses bed at      Patient to be transferred to [facility] on 12/31/15.  Patient to be transferred to facility by [facility]     Patient family notified on 12/31/15 of transfer.  Name of family member notified:  Pt will contact family herself.     PHYSICIAN       Additional Comment: Pt is in agreement with d/c to [facility] today. [facility] transport is needed. Medical necessity form completed. Pt is aware out of pocket costs may be associated with [facility] transport. D/C Summary sent to SNF for review. Scripts included in d/c packet. # for report provided to nsg.   _______________________________________________ Luretha Rued, [place] 12/31/2015, 1:01 PM

## 2015-12-31 NOTE — Progress Notes (Signed)
Occupational Therapy Treatment Patient Details Name: Jasmine Huff MRN: [id number] DOB: 1955-10-01 Today's Date: 12/31/2015    History of present illness Jasmine Huff is a 60 y.o. female with medical history significant of of morbid obesity, HTN, anxiety, chronic pain, OSA noncompliant with CPAP, RSL, DM type II; last admission 11-16-2015 for PNA, acute hypoxic respiratory failure, who presents with worsening Left Lower extremity swelling, redness. She was treated last month for cellulitis. She relates that never went away completely. She report worsening redness since 1 week ago. She saw PCP who started her on Keflex and lasix. Pt admitted through ED 12/28/15 with worsening leg pain and redness   OT comments  SNF is plan and pt agreeable this day  Follow Up Recommendations  SNF    Equipment Recommendations  None recommended by OT       Precautions / Restrictions Precautions Precautions: Fall Precaution Comments: Pt has blister on plantar aspect of L foot Restrictions Weight Bearing Restrictions: No       Mobility Bed Mobility               General bed mobility comments: pt in bathroom  Transfers Overall transfer level: Needs assistance Equipment used: Rolling walker (2 wheeled) Transfers: Sit to/from Stand Sit to Stand: Min guard         General transfer comment: cues for transition position and use of UEs         ADL Overall ADL's : Needs assistance/impaired                         Toilet Transfer: Min guard;RW;Comfort height toilet;Ambulation   Toileting- Clothing Manipulation and Hygiene: Moderate assistance;Cueing for sequencing;Cueing for compensatory techniques;Sit to/from stand                          Cognition   Behavior During Therapy: Anxious Overall Cognitive Status: Within Functional Limits for tasks assessed                         Exercises Other Exercises Other Exercises: OT gave pt yellow theraband.  Pt  demonstrated exercises in which she was doing at home s/p shoulder surgery.            Pertinent Vitals/ Pain       Pain Assessment: No/denies pain Pain Score: 4  Pain Location: L foot Pain Descriptors / Indicators: Sore Pain Intervention(s): Monitored during session            Progress Toward Goals  OT Goals(current goals can now be found in the care plan section)  Progress towards OT goals: Progressing toward goals     Plan Discharge plan remains appropriate       End of Session Equipment Utilized During Treatment: Rolling walker   Activity Tolerance No increased pain   Patient Left in bed;with call bell/phone within reach   Nurse Communication Mobility status        Time: [id number] OT Time Calculation (min): 8 min  Charges: OT General Charges $OT Visit: 1 Procedure OT Treatments $Therapeutic Exercise: 8-22 mins  Jasmine Huff, Edwena Felty D 12/31/2015, 1:49 PM

## 2015-12-31 NOTE — Discharge Summary (Signed)
[facility]  Physician Discharge Summary   Patient ID: Jasmine Huff MRN: [id number] DOB/AGE: October 23, 1955 60 y.o.  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 Patient has an appointment with the pulmonology on June 27. Please try to keep that appointment 2. Wound care as noted below   DISCHARGE CONDITION: fair  Diet recommendation: Heart healthy  Filed Weights   12/28/15 1234 12/30/15 0621  Weight: 159.213 kg (351 lb) 161.1 kg (355 lb 2.6 oz)    INITIAL HISTORY: Jasmine Huff is a 60 y.o. female with medical history significant of of morbid obesity, HTN, anxiety, chronic pain, OSA noncompliant with CPAP, RSL, DM type II; last admission 11-16-2015 for PNA, acute hypoxic respiratory failure, who presented with worsening Left Lower extremity swelling, redness. She was treated last month for cellulitis. She relates that never went away completely.   Consultations:  None  Procedures: Dopplers were negative for DVT  HOSPITAL COURSE:   Left LE cellulitis Patient failed outpatient treatment with Keflex. She was placed on intravenous vancomycin. Wound care was consulted. She does have a blister in the heel area, which also appears to be better. There is some discoloration noted in that area. Her WBC is normal. Erythema is improving. Doppler studies was negative for DVT. Blood cultures are negative so far. She was changed over to oral doxycycline. This will be continued for 2 weeks. Local wound care. Physical therapy.  Wound care instructions: Wound care to left foot, plantar aspect serum filled blister  (full thickness):  Cleanse with NS, patr gently dry.  Cover affected area with two pieces of  folded white petrolatum gauze. Top with dry gauze 4x4s, secure with Kerlix roll gauze wrapped from toes to knee.  Top with application of ACE bandage (6-inch) wrapped in an identical fashion.  Urinary retention Foley catheter had to be placed as the patient had difficulty urinating. This will be discontinued for a voiding trial. As she mobilizes more she should be able to urinate on her own.   Depression Continue with Wellbutrin, lexapro, Latuda.   OSA Continue CPAP. Patient has an appointment with pulmonologist on Tuesday.  Chronic Hypoxic Respiratory failure uncertain etiology, last admission treated for PNA She is on home oxygen. Supposed to follow up with Pulmonologist on Tuesday. No acute process noted on chest x-ray. Continue diuretics.  Chronic pain syndrome Continue with morphine and oxycodone. Will need to be careful with oversedation. She should be on a good bowel regimen.  History of rheumatoid arthritis  Continue with plaquenil.  Questionable history of diabetes mellitus. Patient tells me that she does not have diabetes. Apparently was prescribed glipizide during previous hospitalization, but patient has not taken this medication. CBGs are well controlled. She is requesting that her diet be changed and that we stop checking her CBGs. We will do so. However, her HbA1c was 7 in May. She may discuss this further with her PCP.  Normocytic anemia. Hemoglobin is stable and appears to be close to baseline. No overt bleeding. Continue to monitor.  Overall, stable. Cellulitis is improving. She was seen by physical therapy. She will need short-term rehabilitation, which will be beneficial considering  her cellulitis and wound care needs. Medically she remains stable and okay for discharge to skilled nursing facility today.  PERTINENT LABS:  The results of significant diagnostics from this  hospitalization (including imaging, microbiology, ancillary and laboratory) are listed below for reference.    Microbiology: Recent Results (from the past 240 hour(s))  Culture, blood (Routine X 2) w Reflex to ID Panel     Status: None (Preliminary result)   Collection Time: 12/28/15 12:40 PM  Result Value Ref Range Status   Specimen Description BLOOD BLOOD LEFT FOREARM  Final   Special Requests BOTTLES DRAWN AEROBIC AND ANAEROBIC 5CC  Final   Culture   Final    NO GROWTH 2 DAYS Performed at [facility]    Report Status PENDING  Incomplete  Culture, blood (Routine X 2) w Reflex to ID Panel     Status: None (Preliminary result)   Collection Time: 12/28/15 12:50 PM  Result Value Ref Range Status   Specimen Description BLOOD BLOOD RIGHT FOREARM  Final   Special Requests BOTTLES DRAWN AEROBIC AND ANAEROBIC 5CC  Final   Culture   Final    NO GROWTH 2 DAYS Performed at [facility]    Report Status PENDING  Incomplete  MRSA PCR Screening     Status: None   Collection Time: 12/29/15  3:43 AM  Result Value Ref Range Status   MRSA by PCR NEGATIVE NEGATIVE Final    Comment:        The GeneXpert MRSA Assay (FDA approved for NASAL specimens only), is one component of a comprehensive MRSA colonization surveillance program. It is not intended to diagnose MRSA infection nor to guide or monitor treatment for MRSA infections.      Labs: Basic Metabolic Panel:  Recent Labs Lab 12/28/15 1240 12/29/15 0401 12/30/15 0439  NA 140 138 138  K 4.0 4.0 3.9  CL 101 102 100*  CO2 33* 32 31  GLUCOSE 118* 134* 118*  BUN 16 18 16   CREATININE 0.82 0.94 0.93  CALCIUM 8.6* 8.4* 8.5*   CBC:  Recent Labs Lab 12/28/15 1240 12/29/15 0401 12/30/15 0439  WBC 9.3 8.8 7.9  NEUTROABS 5.6  --   --   HGB 9.6* 8.7* 8.8*  HCT 32.0* 29.0* 29.7*  MCV 80.2 79.2 80.9  PLT 382 327 341   BNP: BNP (last 3 results)  Recent Labs  11/12/15 2321  BNP 116.7*   CBG:  Recent  Labs Lab 12/29/15 0712 12/29/15 1120 12/29/15 1701 12/29/15 2054 12/30/15 0741  GLUCAP 117* 106* 118* 128* 123*     IMAGING STUDIES Dg Chest 2 View  12/28/2015  CLINICAL DATA:  Short of breath for two weeks EXAM: CHEST  2 VIEW COMPARISON:  11/15/15 FINDINGS: Stable cardiomegaly. Mild vascular congestion. No edema or effusion. IMPRESSION: No active cardiopulmonary disease.Stable cardiac enlargement and vascular congestion. Electronically Signed   By: Skipper Cliche M.D.   On: 12/28/2015 15:17   US Venous Img Lower Unilateral Left  12/20/2015  CLINICAL DATA:  Left lower extremity swelling. EXAM: LEFT LOWER EXTREMITY VENOUS DOPPLER ULTRASOUND TECHNIQUE: Gray-scale sonography with graded compression, as well as color Doppler and duplex ultrasound were performed to evaluate the lower extremity deep venous systems from the level of the common femoral vein and including the common femoral, femoral, profunda femoral, popliteal and calf veins including the posterior tibial, peroneal and gastrocnemius veins when visible. The superficial great saphenous vein was also interrogated. Spectral Doppler was utilized to evaluate flow at  rest and with distal augmentation maneuvers in the common femoral, femoral and popliteal veins. COMPARISON:  None. FINDINGS: Patient body habitus limits characterization. Contralateral Common Femoral Vein: Respiratory phasicity is normal and symmetric with the symptomatic side. No evidence of thrombus. Normal compressibility. Common Femoral Vein: No evidence of thrombus. Normal compressibility, respiratory phasicity and response to augmentation. Saphenofemoral Junction: No evidence of thrombus. Normal compressibility and flow on color Doppler imaging. Profunda Femoral Vein: No evidence of thrombus. Normal compressibility and flow on color Doppler imaging. Femoral Vein: No evidence of thrombus. Normal compressibility, respiratory phasicity and response to augmentation. Popliteal Vein:  No evidence of thrombus. Normal compressibility, respiratory phasicity and response to augmentation. Calf Veins: No evidence of thrombus. Normal compressibility and flow on color Doppler imaging. Superficial Great Saphenous Vein: No evidence of thrombus. Normal compressibility and flow on color Doppler imaging. Venous Reflux:  None. Other Findings: Ill-defined fluid/edema within the subcutaneous soft tissues of the lower thigh and upper calf regions. IMPRESSION: No evidence of deep venous thrombosis. Ill-defined fluid/edema within the subcutaneous soft tissues of the lower thigh and upper calf regions. Electronically Signed   By: Franki Cabot M.D.   On: 12/20/2015 09:31    DISCHARGE EXAMINATION: Filed Vitals:   12/30/15 0621 12/30/15 1453 12/30/15 2115 12/31/15 0619  BP:  130/63 140/65 157/70  Pulse:  89 90 95  Temp:  98.4 F (36.9 C) 99.9 F (37.7 C) 98.6 F (37 C)  TempSrc:  Oral Oral Oral  Resp:  16 18 18   Height:      Weight: 161.1 kg (355 lb 2.6 oz)     SpO2:  94% 94% 93%   General appearance: alert, cooperative, appears stated age and morbidly obese Resp: clear to auscultation bilaterally Cardio: regular rate and rhythm, S1, S2 normal, no murmur, click, rub or gallop GI: soft, non-tender; bowel sounds normal; no masses,  no organomegaly Extremities: Erythema in the left extremity. Blister in the left heel.  DISPOSITION: SNF  Discharge Instructions    Call MD for:  difficulty breathing, headache or visual disturbances    Complete by:  As directed      Call MD for:  extreme fatigue    Complete by:  As directed      Call MD for:  persistant dizziness or light-headedness    Complete by:  As directed      Call MD for:  persistant nausea and vomiting    Complete by:  As directed      Call MD for:  severe uncontrolled pain    Complete by:  As directed      Call MD for:  temperature >100.4    Complete by:  As directed      Diet - low sodium heart healthy    Complete by:  As  directed      Discharge instructions    Complete by:  As directed   See wound care instructions. Patient has an appointment with a pulmonologist on June 27. Please try to keep that appointment.  You were cared for by a hospitalist during your hospital stay. If you have any questions about your discharge medications or the care you received while you were in the hospital after you are discharged, you can call the unit and asked to speak with the hospitalist on call if the hospitalist that took care of you is not available. Once you are discharged, your primary care physician will handle any further medical issues. Please note that NO REFILLS for  any discharge medications will be authorized once you are discharged, as it is imperative that you return to your primary care physician (or establish a relationship with a primary care physician if you do not have one) for your aftercare needs so that they can reassess your need for medications and monitor your lab values. If you do not have a primary care physician, you can call [phone] for a physician referral.     Increase activity slowly    Complete by:  As directed            ALLERGIES:  Allergies  Allergen Reactions  . Asenapine Anaphylaxis, Shortness Of Breath and Other (See Comments)    Lips tingle and tongue becomes numb/Akasthesia  . Saphris [Asenapine Maleate] Anaphylaxis, Shortness Of Breath and Other (See Comments)    Lips tingle and tongue becomes numb/Akasthesia  . Seroquel [Quetiapine Fumarate] Shortness Of Breath and Other (See Comments)    "out of breath"  . Iodinated Diagnostic Agents Hives    Dye used for CT scans, severe hives  . Pregabalin Rash and Other (See Comments)    Alters Mental status, flushing  Caused by lyrica  . Buprenorphine Rash    Adhesive on butrans patch     Current Discharge Medication List    START taking these medications   Details  doxycycline (VIBRA-TABS) 100 MG tablet Take 1 tablet (100 mg total)  by mouth every 12 (twelve) hours. For 2 weeks    polyethylene glycol (MIRALAX / GLYCOLAX) packet Take 17 g by mouth 2 (two) times daily. Qty: 14 each, Refills: 0    senna-docusate (SENOKOT-S) 8.6-50 MG tablet Take 1 tablet by mouth 2 (two) times daily.      CONTINUE these medications which have CHANGED   Details  ALPRAZolam (XANAX) 0.5 MG tablet Take 1 tablet (0.5 mg total) by mouth daily as needed for anxiety. Qty: 30 tablet, Refills: 0    morphine (MS CONTIN) 15 MG 12 hr tablet Take 1 tablet (15 mg total) by mouth 2 (two) times daily. Qty: 30 tablet, Refills: 0    oxyCODONE (OXY IR/ROXICODONE) 5 MG immediate release tablet Take 1 tablet (5 mg total) by mouth every 8 (eight) hours as needed for moderate pain. Qty: 30 tablet, Refills: 0      CONTINUE these medications which have NOT CHANGED   Details  acetaminophen (TYLENOL) 500 MG tablet Take 1,000 mg by mouth every 6 (six) hours as needed for moderate pain.     amLODipine (NORVASC) 5 MG tablet Take 1 tablet (5 mg total) by mouth daily. Qty: 30 tablet, Refills: 0    buPROPion (WELLBUTRIN XL) 150 MG 24 hr tablet Take 450 mg by mouth every morning.    cetirizine (ZYRTEC) 10 MG tablet Take 10 mg by mouth every evening.     Cholecalciferol (VITAMIN D3) 5000 units TABS Take 5,000 Units by mouth daily.    escitalopram (LEXAPRO) 20 MG tablet Take 40 mg by mouth every morning.     furosemide (LASIX) 20 MG tablet Take 20 mg by mouth daily.    hydroxychloroquine (PLAQUENIL) 200 MG tablet Take 100-200 mg by mouth 2 (two) times daily. 200mg  in the morning, 100mg  in the evening    Iron-FA-B Cmp-C-Biot-Probiotic (FUSION PLUS) CAPS Take 1 capsule by mouth daily.    lurasidone (LATUDA) 80 MG TABS tablet Take 1 tablet (80 mg total) by mouth daily. Qty: 30 tablet, Refills: 0    nystatin cream (MYCOSTATIN) Apply 1 application topically 2 (two)  times daily. Uses for yeast infection    omeprazole (PRILOSEC) 40 MG capsule Take 40 mg by mouth  every evening.     rOPINIRole (REQUIP) 4 MG tablet Take 4 mg by mouth at bedtime.      STOP taking these medications     cephALEXin (KEFLEX) 500 MG capsule      glipiZIDE (GLUCOTROL) 5 MG tablet        Follow-up Information    Follow up with Lilian Coma, MD. Schedule an appointment as soon as possible for a visit in 1 week.   Specialty:  Family Medicine   Contact information:   [place] [address] [address] [phone]       Follow up with Christinia Gully, MD On 01/01/2016.   Specialty:  Pulmonary Disease   Why:  Per patient: She has an appointment on 6/27 at 3 pm.    Contact information:   [address] [address] [phone]       TOTAL DISCHARGE TIME: 64 minutes  [facility] Pager [phone]  12/31/2015, 10:45 AM

## 2015-12-31 NOTE — Care Management Important Message (Signed)
Important Message  Patient Details  Name: Betzabet Imbriano MRN: [id number] Date of Birth: 01-04-1956   Medicare Important Message Given:  Yes    Camillo Flaming 12/31/2015, 11:53 AMImportant Message  Patient Details  Name: Imahni Odom MRN: [id number] Date of Birth: July 16, 1955   Medicare Important Message Given:  Yes    Camillo Flaming 12/31/2015, 11:53 AM

## 2015-12-31 NOTE — Progress Notes (Addendum)
CSW assisting with d/c planning. [facility] is unable to offer placement. Pt has give CSW permission to extend SNF search. Pt has requested placement at [facility]. SNF contacted and decision is pending.  Werner Lean LCSW [id number]  10:37  [facility] is able to accept pt today for SNF placement.  Werner Lean LCSW [phone]

## 2016-01-01 ENCOUNTER — Institutional Professional Consult (permissible substitution): Payer: Self-pay | Admitting: Internal Medicine

## 2016-01-02 LAB — CULTURE, BLOOD (ROUTINE X 2)
Culture: NO GROWTH
Culture: NO GROWTH

## 2016-01-03 DIAGNOSIS — G894 Chronic pain syndrome: Secondary | ICD-10-CM | POA: Diagnosis not present

## 2016-01-03 DIAGNOSIS — D638 Anemia in other chronic diseases classified elsewhere: Secondary | ICD-10-CM | POA: Diagnosis not present

## 2016-01-03 DIAGNOSIS — M069 Rheumatoid arthritis, unspecified: Secondary | ICD-10-CM | POA: Diagnosis not present

## 2016-01-03 DIAGNOSIS — I10 Essential (primary) hypertension: Secondary | ICD-10-CM | POA: Diagnosis not present

## 2016-01-05 DIAGNOSIS — E119 Type 2 diabetes mellitus without complications: Secondary | ICD-10-CM | POA: Diagnosis not present

## 2016-01-05 DIAGNOSIS — G4733 Obstructive sleep apnea (adult) (pediatric): Secondary | ICD-10-CM | POA: Diagnosis not present

## 2016-01-05 DIAGNOSIS — G894 Chronic pain syndrome: Secondary | ICD-10-CM | POA: Diagnosis not present

## 2016-01-05 DIAGNOSIS — L03116 Cellulitis of left lower limb: Secondary | ICD-10-CM | POA: Diagnosis not present

## 2016-01-05 DIAGNOSIS — M797 Fibromyalgia: Secondary | ICD-10-CM | POA: Diagnosis not present

## 2016-01-05 DIAGNOSIS — J189 Pneumonia, unspecified organism: Secondary | ICD-10-CM | POA: Diagnosis not present

## 2016-01-07 DIAGNOSIS — G894 Chronic pain syndrome: Secondary | ICD-10-CM | POA: Diagnosis not present

## 2016-01-07 DIAGNOSIS — M797 Fibromyalgia: Secondary | ICD-10-CM | POA: Diagnosis not present

## 2016-01-07 DIAGNOSIS — E119 Type 2 diabetes mellitus without complications: Secondary | ICD-10-CM | POA: Diagnosis not present

## 2016-01-07 DIAGNOSIS — J189 Pneumonia, unspecified organism: Secondary | ICD-10-CM | POA: Diagnosis not present

## 2016-01-07 DIAGNOSIS — L03116 Cellulitis of left lower limb: Secondary | ICD-10-CM | POA: Diagnosis not present

## 2016-01-07 DIAGNOSIS — G4733 Obstructive sleep apnea (adult) (pediatric): Secondary | ICD-10-CM | POA: Diagnosis not present

## 2016-01-08 DIAGNOSIS — G4733 Obstructive sleep apnea (adult) (pediatric): Secondary | ICD-10-CM | POA: Diagnosis not present

## 2016-01-08 DIAGNOSIS — E119 Type 2 diabetes mellitus without complications: Secondary | ICD-10-CM | POA: Diagnosis not present

## 2016-01-08 DIAGNOSIS — M797 Fibromyalgia: Secondary | ICD-10-CM | POA: Diagnosis not present

## 2016-01-08 DIAGNOSIS — J189 Pneumonia, unspecified organism: Secondary | ICD-10-CM | POA: Diagnosis not present

## 2016-01-08 DIAGNOSIS — L03116 Cellulitis of left lower limb: Secondary | ICD-10-CM | POA: Diagnosis not present

## 2016-01-08 DIAGNOSIS — G894 Chronic pain syndrome: Secondary | ICD-10-CM | POA: Diagnosis not present

## 2016-01-09 DIAGNOSIS — M797 Fibromyalgia: Secondary | ICD-10-CM | POA: Diagnosis not present

## 2016-01-09 DIAGNOSIS — L03116 Cellulitis of left lower limb: Secondary | ICD-10-CM | POA: Diagnosis not present

## 2016-01-09 DIAGNOSIS — J189 Pneumonia, unspecified organism: Secondary | ICD-10-CM | POA: Diagnosis not present

## 2016-01-09 DIAGNOSIS — G4733 Obstructive sleep apnea (adult) (pediatric): Secondary | ICD-10-CM | POA: Diagnosis not present

## 2016-01-09 DIAGNOSIS — E119 Type 2 diabetes mellitus without complications: Secondary | ICD-10-CM | POA: Diagnosis not present

## 2016-01-09 DIAGNOSIS — G894 Chronic pain syndrome: Secondary | ICD-10-CM | POA: Diagnosis not present

## 2016-01-11 ENCOUNTER — Ambulatory Visit (INDEPENDENT_AMBULATORY_CARE_PROVIDER_SITE_OTHER): Payer: Medicare Other | Admitting: Internal Medicine

## 2016-01-11 ENCOUNTER — Encounter: Payer: Self-pay | Admitting: Internal Medicine

## 2016-01-11 VITALS — BP 134/84 | HR 92 | Ht 63.0 in | Wt 330.0 lb

## 2016-01-11 DIAGNOSIS — J9611 Chronic respiratory failure with hypoxia: Secondary | ICD-10-CM | POA: Diagnosis not present

## 2016-01-11 DIAGNOSIS — L03116 Cellulitis of left lower limb: Secondary | ICD-10-CM | POA: Diagnosis not present

## 2016-01-11 DIAGNOSIS — M797 Fibromyalgia: Secondary | ICD-10-CM | POA: Diagnosis not present

## 2016-01-11 DIAGNOSIS — E1122 Type 2 diabetes mellitus with diabetic chronic kidney disease: Secondary | ICD-10-CM | POA: Diagnosis not present

## 2016-01-11 DIAGNOSIS — J9612 Chronic respiratory failure with hypercapnia: Secondary | ICD-10-CM | POA: Diagnosis not present

## 2016-01-11 DIAGNOSIS — R0600 Dyspnea, unspecified: Secondary | ICD-10-CM

## 2016-01-11 DIAGNOSIS — E119 Type 2 diabetes mellitus without complications: Secondary | ICD-10-CM | POA: Diagnosis not present

## 2016-01-11 DIAGNOSIS — E662 Morbid (severe) obesity with alveolar hypoventilation: Secondary | ICD-10-CM | POA: Diagnosis not present

## 2016-01-11 DIAGNOSIS — G4733 Obstructive sleep apnea (adult) (pediatric): Secondary | ICD-10-CM | POA: Diagnosis not present

## 2016-01-11 DIAGNOSIS — G894 Chronic pain syndrome: Secondary | ICD-10-CM | POA: Diagnosis not present

## 2016-01-11 DIAGNOSIS — E11621 Type 2 diabetes mellitus with foot ulcer: Secondary | ICD-10-CM | POA: Diagnosis not present

## 2016-01-11 DIAGNOSIS — J189 Pneumonia, unspecified organism: Secondary | ICD-10-CM | POA: Diagnosis not present

## 2016-01-11 NOTE — Patient Instructions (Addendum)
Please see patient coordinator before you leave today  to schedule overnight o2 levels on 2lpm   Continue to wear the 02 as much as possible but especially at bedtime  We will set you up with one of our sleep specialists  - declined, so f/u here in 3 months to reconsider ? NIV hs next step?

## 2016-01-11 NOTE — Progress Notes (Signed)
Subjective:     Patient ID: Jasmine Huff, female   DOB: 17-Dec-1955,    MRN: [id number]  HPI  75  yowf never smoker with year round "allergies" since childhood = itching/sneezing and runny nose and took allergy shots x 5 y in the early 2000s and no better/ zytec the best never previously needed inhaler/ normally limited by  knees not the breathing to room to room with walker last shopping around 2005 uses scooter with new onset sob early May 2017 better since placed on 2lpm at hs and with activity but not at rest but not explanation for sob so referred to pulmonary clinic 01/11/2016 by Dr Stephanie Acre p admit:   01/11/2016 1st [facility] office visit/ Ron Agee since May 2017 Chief Complaint  Patient presents with  . Pulmonary Consult    Referred by Dr. Stephanie Acre. Pt c/o "periods of breathlessness" for the past month. She states that she gets out of breath walking approx 15 yards.   breathing is fine at rest/ fine at hs and could not tol cpap x 6 m prior to OV   Feels fine in am most of the time wearing the 02 at 2lpm Wt gain/ fluid retention assoc with sob and breathing better p 30 lb wt loss    No obvious day to day or daytime variability or assoc excess/ purulent sputum or mucus plugs or hemoptysis or cp or chest tightness, subjective wheeze or overt sinus or hb symptoms. No unusual exp hx or h/o childhood pna/ asthma or knowledge of premature birth.  Sleeping ok onm 02  without nocturnal  or early am exacerbation  of respiratory  c/o's or need for noct saba. Also denies any obvious fluctuation of symptoms with weather or environmental changes or other aggravating or alleviating factors except as outlined above   Current Medications, Allergies, Complete Past Medical History, Past Surgical History, Family History, and Social History were reviewed in [employer] record.  ROS  The following are not active complaints unless bolded sore throat, dysphagia, dental problems,  itching, sneezing,  nasal congestion or excess/ purulent secretions, ear ache,   fever, chills, sweats, unintended wt loss, classically pleuritic or exertional cp,  orthopnea pnd or leg swelling, presyncope, palpitations, abdominal pain, anorexia, nausea, vomiting, diarrhea  or change in bowel or bladder habits, change in stools or urine, dysuria,hematuria,  rash, arthralgias, visual complaints, headache, numbness, weakness or ataxia or problems with walking or coordination,  change in mood/affect or memory.             Review of Systems     Objective:   Physical Exam  W/c bound alert massively  obese wf nad   Wt Readings from Last 3 Encounters:  01/11/16 330 lb (149.687 kg)  12/31/15 353 lb 4.8 oz (160.256 kg)  11/13/15 360 lb 7.2 oz (163.5 kg)    Vital signs reviewed   HEENT: nl dentition, turbinates, and oropharynx. Nl external ear canals without cough reflex   NECK :  without JVD/Nodes/TM/ nl carotid upstrokes bilaterally   LUNGS: no acc muscle use,  Nl contour chest which is clear to A and P bilaterally without cough on insp or exp maneuvers   CV:  RRR  no s3 or murmur or increase in P2, no edema   ABD:  soft and nontender with nl inspiratory excursion in the supine position. No bruits or organomegaly, bowel sounds nl  MS:  Nl gait/ ext warm without deformities, calf tenderness, cyanosis or  clubbing No obvious joint restrictions   SKIN: warm and dry without lesions    NEURO:  alert, approp, nl sensorium with  no motor deficits    I personally reviewed images and agree with radiology impression as follows:  CT Chest   11/13/15  No large central emboli. Study is limited for detection of lobar or segmental emboli. There are multifocal patchy airspace opacities which may represent air trapping, pneumonitis, infection. No effusions.          Assessment:

## 2016-01-13 NOTE — Assessment & Plan Note (Signed)
Placed on 2lpm may 2017 -  HCO3    12/30/15  = 31  - Spirometry 01/11/2016  Restrictive changes only  - ONO 2lpm 01/11/2016 >>>   Appears to be entirely related to morbid obesity with non - adherence to cpap and refuses to consider referral to one of our sleep specialists to re eval with ? Need NIV/ bipap instead.  The problem is one of diaphragm fatigue in supine positions and is not going to be solved just with 02 but contends has already seen 2 sleep doctors, neither with pulmonary background, and does not want to see our at this point so nothing else to offer here  Total time devoted to counseling  = 35/52m review case with pt/ discussion of options/alternatives/ personally creating written instructions  in presence of pt  then going over those specific  Instructions directly with the pt including how to use all of the meds but in particular covering each new medication in detail and the difference between the maintenance/automatic meds and the prns using an action plan format for the latter.

## 2016-01-13 NOTE — Assessment & Plan Note (Signed)
Body mass index is 58.47 Lab Results  Component Value Date   TSH 0.812 11/08/2015     Contributing to gerd tendency/ doe/reviewed the need and the process to achieve and maintain neg calorie balance > defer f/u primary care including intermittently monitoring thyroid status

## 2016-01-16 DIAGNOSIS — M797 Fibromyalgia: Secondary | ICD-10-CM | POA: Diagnosis not present

## 2016-01-16 DIAGNOSIS — L89890 Pressure ulcer of other site, unstageable: Secondary | ICD-10-CM | POA: Diagnosis not present

## 2016-01-16 DIAGNOSIS — G894 Chronic pain syndrome: Secondary | ICD-10-CM | POA: Diagnosis not present

## 2016-01-16 DIAGNOSIS — M25562 Pain in left knee: Secondary | ICD-10-CM | POA: Diagnosis not present

## 2016-01-16 DIAGNOSIS — M549 Dorsalgia, unspecified: Secondary | ICD-10-CM | POA: Diagnosis not present

## 2016-01-16 DIAGNOSIS — I5032 Chronic diastolic (congestive) heart failure: Secondary | ICD-10-CM | POA: Diagnosis not present

## 2016-01-16 DIAGNOSIS — E119 Type 2 diabetes mellitus without complications: Secondary | ICD-10-CM | POA: Diagnosis not present

## 2016-01-16 DIAGNOSIS — J189 Pneumonia, unspecified organism: Secondary | ICD-10-CM | POA: Diagnosis not present

## 2016-01-16 DIAGNOSIS — Z9981 Dependence on supplemental oxygen: Secondary | ICD-10-CM | POA: Diagnosis not present

## 2016-01-16 DIAGNOSIS — M25561 Pain in right knee: Secondary | ICD-10-CM | POA: Diagnosis not present

## 2016-01-16 DIAGNOSIS — L03116 Cellulitis of left lower limb: Secondary | ICD-10-CM | POA: Diagnosis not present

## 2016-01-16 DIAGNOSIS — G4733 Obstructive sleep apnea (adult) (pediatric): Secondary | ICD-10-CM | POA: Diagnosis not present

## 2016-01-16 DIAGNOSIS — J9621 Acute and chronic respiratory failure with hypoxia: Secondary | ICD-10-CM | POA: Diagnosis not present

## 2016-01-16 DIAGNOSIS — E669 Obesity, unspecified: Secondary | ICD-10-CM | POA: Diagnosis not present

## 2016-01-16 DIAGNOSIS — G2581 Restless legs syndrome: Secondary | ICD-10-CM | POA: Diagnosis not present

## 2016-01-16 DIAGNOSIS — Z6841 Body Mass Index (BMI) 40.0 and over, adult: Secondary | ICD-10-CM | POA: Diagnosis not present

## 2016-01-18 DIAGNOSIS — E119 Type 2 diabetes mellitus without complications: Secondary | ICD-10-CM | POA: Diagnosis not present

## 2016-01-18 DIAGNOSIS — M797 Fibromyalgia: Secondary | ICD-10-CM | POA: Diagnosis not present

## 2016-01-18 DIAGNOSIS — G894 Chronic pain syndrome: Secondary | ICD-10-CM | POA: Diagnosis not present

## 2016-01-18 DIAGNOSIS — L03116 Cellulitis of left lower limb: Secondary | ICD-10-CM | POA: Diagnosis not present

## 2016-01-18 DIAGNOSIS — M549 Dorsalgia, unspecified: Secondary | ICD-10-CM | POA: Diagnosis not present

## 2016-01-18 DIAGNOSIS — L89890 Pressure ulcer of other site, unstageable: Secondary | ICD-10-CM | POA: Diagnosis not present

## 2016-01-22 ENCOUNTER — Telehealth: Payer: Self-pay | Admitting: Internal Medicine

## 2016-01-22 DIAGNOSIS — L03116 Cellulitis of left lower limb: Secondary | ICD-10-CM | POA: Diagnosis not present

## 2016-01-22 DIAGNOSIS — G894 Chronic pain syndrome: Secondary | ICD-10-CM | POA: Diagnosis not present

## 2016-01-22 DIAGNOSIS — L89890 Pressure ulcer of other site, unstageable: Secondary | ICD-10-CM | POA: Diagnosis not present

## 2016-01-22 DIAGNOSIS — M797 Fibromyalgia: Secondary | ICD-10-CM | POA: Diagnosis not present

## 2016-01-22 DIAGNOSIS — M549 Dorsalgia, unspecified: Secondary | ICD-10-CM | POA: Diagnosis not present

## 2016-01-22 DIAGNOSIS — E119 Type 2 diabetes mellitus without complications: Secondary | ICD-10-CM | POA: Diagnosis not present

## 2016-01-22 NOTE — Telephone Encounter (Signed)
ONO on 2lpm [facility] 01/16/16) was normal, needs to continue o2 2lpm with sleep  Spoke with pt and notified of results per Dr. Melvyn Novas. Pt verbalized understanding and denied any questions.

## 2016-01-23 ENCOUNTER — Telehealth: Payer: Self-pay | Admitting: Internal Medicine

## 2016-01-23 DIAGNOSIS — M797 Fibromyalgia: Secondary | ICD-10-CM | POA: Diagnosis not present

## 2016-01-23 DIAGNOSIS — L03116 Cellulitis of left lower limb: Secondary | ICD-10-CM | POA: Diagnosis not present

## 2016-01-23 DIAGNOSIS — M549 Dorsalgia, unspecified: Secondary | ICD-10-CM | POA: Diagnosis not present

## 2016-01-23 DIAGNOSIS — G894 Chronic pain syndrome: Secondary | ICD-10-CM | POA: Diagnosis not present

## 2016-01-23 DIAGNOSIS — E119 Type 2 diabetes mellitus without complications: Secondary | ICD-10-CM | POA: Diagnosis not present

## 2016-01-23 DIAGNOSIS — L89890 Pressure ulcer of other site, unstageable: Secondary | ICD-10-CM | POA: Diagnosis not present

## 2016-01-23 NOTE — Telephone Encounter (Signed)
Pt aware of MW recommendations. Pt states she will call back to make an appt for a walk to certify her need of 02 with exertion.  Nothing further needed.

## 2016-01-23 NOTE — Telephone Encounter (Signed)
Ok to use with exertion but we may need her back here to certify for portable 02 officially

## 2016-01-23 NOTE — Telephone Encounter (Signed)
Pt states 02 stats are dropping in the mid 80's with exertion, quickly recovers to mid 90's after 30 seconds of rest. Pt wanting to know if she needs to wear 02 with exertion or only at bedtime. MW please advise. Thanks

## 2016-01-29 ENCOUNTER — Encounter: Payer: Self-pay | Admitting: Internal Medicine

## 2016-02-01 DIAGNOSIS — E119 Type 2 diabetes mellitus without complications: Secondary | ICD-10-CM | POA: Diagnosis not present

## 2016-02-01 DIAGNOSIS — M549 Dorsalgia, unspecified: Secondary | ICD-10-CM | POA: Diagnosis not present

## 2016-02-01 DIAGNOSIS — L89890 Pressure ulcer of other site, unstageable: Secondary | ICD-10-CM | POA: Diagnosis not present

## 2016-02-01 DIAGNOSIS — M797 Fibromyalgia: Secondary | ICD-10-CM | POA: Diagnosis not present

## 2016-02-01 DIAGNOSIS — G894 Chronic pain syndrome: Secondary | ICD-10-CM | POA: Diagnosis not present

## 2016-02-01 DIAGNOSIS — L03116 Cellulitis of left lower limb: Secondary | ICD-10-CM | POA: Diagnosis not present

## 2016-02-07 DIAGNOSIS — G894 Chronic pain syndrome: Secondary | ICD-10-CM | POA: Diagnosis not present

## 2016-02-07 DIAGNOSIS — M1710 Unilateral primary osteoarthritis, unspecified knee: Secondary | ICD-10-CM | POA: Diagnosis not present

## 2016-02-08 DIAGNOSIS — E119 Type 2 diabetes mellitus without complications: Secondary | ICD-10-CM | POA: Diagnosis not present

## 2016-02-08 DIAGNOSIS — G894 Chronic pain syndrome: Secondary | ICD-10-CM | POA: Diagnosis not present

## 2016-02-08 DIAGNOSIS — L89890 Pressure ulcer of other site, unstageable: Secondary | ICD-10-CM | POA: Diagnosis not present

## 2016-02-08 DIAGNOSIS — M549 Dorsalgia, unspecified: Secondary | ICD-10-CM | POA: Diagnosis not present

## 2016-02-08 DIAGNOSIS — L03116 Cellulitis of left lower limb: Secondary | ICD-10-CM | POA: Diagnosis not present

## 2016-02-08 DIAGNOSIS — M797 Fibromyalgia: Secondary | ICD-10-CM | POA: Diagnosis not present

## 2016-02-12 DIAGNOSIS — F331 Major depressive disorder, recurrent, moderate: Secondary | ICD-10-CM | POA: Diagnosis not present

## 2016-02-12 DIAGNOSIS — F411 Generalized anxiety disorder: Secondary | ICD-10-CM | POA: Diagnosis not present

## 2016-02-15 DIAGNOSIS — L03116 Cellulitis of left lower limb: Secondary | ICD-10-CM | POA: Diagnosis not present

## 2016-02-15 DIAGNOSIS — E119 Type 2 diabetes mellitus without complications: Secondary | ICD-10-CM | POA: Diagnosis not present

## 2016-02-15 DIAGNOSIS — L89890 Pressure ulcer of other site, unstageable: Secondary | ICD-10-CM | POA: Diagnosis not present

## 2016-02-15 DIAGNOSIS — M549 Dorsalgia, unspecified: Secondary | ICD-10-CM | POA: Diagnosis not present

## 2016-02-15 DIAGNOSIS — M797 Fibromyalgia: Secondary | ICD-10-CM | POA: Diagnosis not present

## 2016-02-15 DIAGNOSIS — G894 Chronic pain syndrome: Secondary | ICD-10-CM | POA: Diagnosis not present

## 2016-02-21 DIAGNOSIS — G894 Chronic pain syndrome: Secondary | ICD-10-CM | POA: Diagnosis not present

## 2016-02-21 DIAGNOSIS — E119 Type 2 diabetes mellitus without complications: Secondary | ICD-10-CM | POA: Diagnosis not present

## 2016-02-21 DIAGNOSIS — M549 Dorsalgia, unspecified: Secondary | ICD-10-CM | POA: Diagnosis not present

## 2016-02-21 DIAGNOSIS — L89890 Pressure ulcer of other site, unstageable: Secondary | ICD-10-CM | POA: Diagnosis not present

## 2016-02-21 DIAGNOSIS — M797 Fibromyalgia: Secondary | ICD-10-CM | POA: Diagnosis not present

## 2016-02-21 DIAGNOSIS — L03116 Cellulitis of left lower limb: Secondary | ICD-10-CM | POA: Diagnosis not present

## 2016-02-27 DIAGNOSIS — L03116 Cellulitis of left lower limb: Secondary | ICD-10-CM | POA: Diagnosis not present

## 2016-02-27 DIAGNOSIS — L89890 Pressure ulcer of other site, unstageable: Secondary | ICD-10-CM | POA: Diagnosis not present

## 2016-02-27 DIAGNOSIS — E119 Type 2 diabetes mellitus without complications: Secondary | ICD-10-CM | POA: Diagnosis not present

## 2016-02-27 DIAGNOSIS — G894 Chronic pain syndrome: Secondary | ICD-10-CM | POA: Diagnosis not present

## 2016-02-27 DIAGNOSIS — M797 Fibromyalgia: Secondary | ICD-10-CM | POA: Diagnosis not present

## 2016-02-27 DIAGNOSIS — M549 Dorsalgia, unspecified: Secondary | ICD-10-CM | POA: Diagnosis not present

## 2016-03-06 DIAGNOSIS — G894 Chronic pain syndrome: Secondary | ICD-10-CM | POA: Diagnosis not present

## 2016-03-06 DIAGNOSIS — L03116 Cellulitis of left lower limb: Secondary | ICD-10-CM | POA: Diagnosis not present

## 2016-03-06 DIAGNOSIS — M549 Dorsalgia, unspecified: Secondary | ICD-10-CM | POA: Diagnosis not present

## 2016-03-06 DIAGNOSIS — L89890 Pressure ulcer of other site, unstageable: Secondary | ICD-10-CM | POA: Diagnosis not present

## 2016-03-06 DIAGNOSIS — E119 Type 2 diabetes mellitus without complications: Secondary | ICD-10-CM | POA: Diagnosis not present

## 2016-03-06 DIAGNOSIS — M797 Fibromyalgia: Secondary | ICD-10-CM | POA: Diagnosis not present

## 2016-03-14 DIAGNOSIS — E119 Type 2 diabetes mellitus without complications: Secondary | ICD-10-CM | POA: Diagnosis not present

## 2016-03-14 DIAGNOSIS — L03116 Cellulitis of left lower limb: Secondary | ICD-10-CM | POA: Diagnosis not present

## 2016-03-14 DIAGNOSIS — G894 Chronic pain syndrome: Secondary | ICD-10-CM | POA: Diagnosis not present

## 2016-03-14 DIAGNOSIS — L89890 Pressure ulcer of other site, unstageable: Secondary | ICD-10-CM | POA: Diagnosis not present

## 2016-03-14 DIAGNOSIS — M549 Dorsalgia, unspecified: Secondary | ICD-10-CM | POA: Diagnosis not present

## 2016-03-14 DIAGNOSIS — M797 Fibromyalgia: Secondary | ICD-10-CM | POA: Diagnosis not present

## 2016-03-18 ENCOUNTER — Other Ambulatory Visit: Payer: Self-pay

## 2016-03-18 DIAGNOSIS — Z1231 Encounter for screening mammogram for malignant neoplasm of breast: Secondary | ICD-10-CM

## 2016-03-18 DIAGNOSIS — Z23 Encounter for immunization: Secondary | ICD-10-CM | POA: Diagnosis not present

## 2016-03-18 DIAGNOSIS — N63 Unspecified lump in breast: Secondary | ICD-10-CM | POA: Diagnosis not present

## 2016-03-18 DIAGNOSIS — Z79899 Other long term (current) drug therapy: Secondary | ICD-10-CM | POA: Diagnosis not present

## 2016-03-18 DIAGNOSIS — N182 Chronic kidney disease, stage 2 (mild): Secondary | ICD-10-CM | POA: Diagnosis not present

## 2016-03-18 DIAGNOSIS — K219 Gastro-esophageal reflux disease without esophagitis: Secondary | ICD-10-CM | POA: Diagnosis not present

## 2016-03-18 DIAGNOSIS — R600 Localized edema: Secondary | ICD-10-CM | POA: Diagnosis not present

## 2016-03-18 DIAGNOSIS — E559 Vitamin D deficiency, unspecified: Secondary | ICD-10-CM | POA: Diagnosis not present

## 2016-03-18 DIAGNOSIS — E1122 Type 2 diabetes mellitus with diabetic chronic kidney disease: Secondary | ICD-10-CM | POA: Diagnosis not present

## 2016-03-18 DIAGNOSIS — M069 Rheumatoid arthritis, unspecified: Secondary | ICD-10-CM | POA: Diagnosis not present

## 2016-03-19 ENCOUNTER — Other Ambulatory Visit: Payer: Self-pay

## 2016-03-19 ENCOUNTER — Other Ambulatory Visit: Payer: Self-pay | Admitting: Family Medicine

## 2016-03-19 DIAGNOSIS — N6310 Unspecified lump in the right breast, unspecified quadrant: Secondary | ICD-10-CM

## 2016-03-25 ENCOUNTER — Ambulatory Visit
Admission: RE | Admit: 2016-03-25 | Discharge: 2016-03-25 | Disposition: A | Discharge status: Home / Self Care | Payer: Medicare Other | Source: Ambulatory Visit | Attending: Family Medicine | Admitting: Family Medicine

## 2016-03-25 ENCOUNTER — Other Ambulatory Visit: Payer: Self-pay | Admitting: Internal Medicine

## 2016-03-25 ENCOUNTER — Ambulatory Visit
Admission: RE | Admit: 2016-03-25 | Discharge: 2016-03-25 | Disposition: A | Discharge status: Home / Self Care | Payer: Medicare Other | Source: Ambulatory Visit | Attending: Internal Medicine | Admitting: Internal Medicine

## 2016-03-25 DIAGNOSIS — N6310 Unspecified lump in the right breast, unspecified quadrant: Secondary | ICD-10-CM

## 2016-03-25 DIAGNOSIS — R928 Other abnormal and inconclusive findings on diagnostic imaging of breast: Secondary | ICD-10-CM | POA: Diagnosis not present

## 2016-03-25 DIAGNOSIS — N6489 Other specified disorders of breast: Secondary | ICD-10-CM | POA: Diagnosis not present

## 2016-04-03 DIAGNOSIS — M0579 Rheumatoid arthritis with rheumatoid factor of multiple sites without organ or systems involvement: Secondary | ICD-10-CM | POA: Diagnosis not present

## 2016-04-03 DIAGNOSIS — G5601 Carpal tunnel syndrome, right upper limb: Secondary | ICD-10-CM | POA: Diagnosis not present

## 2016-04-03 DIAGNOSIS — M797 Fibromyalgia: Secondary | ICD-10-CM | POA: Diagnosis not present

## 2016-04-03 DIAGNOSIS — E669 Obesity, unspecified: Secondary | ICD-10-CM | POA: Diagnosis not present

## 2016-04-04 DIAGNOSIS — M19012 Primary osteoarthritis, left shoulder: Secondary | ICD-10-CM | POA: Diagnosis not present

## 2016-04-04 DIAGNOSIS — M1710 Unilateral primary osteoarthritis, unspecified knee: Secondary | ICD-10-CM | POA: Diagnosis not present

## 2016-04-04 DIAGNOSIS — G894 Chronic pain syndrome: Secondary | ICD-10-CM | POA: Diagnosis not present

## 2016-04-05 DIAGNOSIS — R600 Localized edema: Secondary | ICD-10-CM | POA: Diagnosis not present

## 2016-04-05 DIAGNOSIS — I129 Hypertensive chronic kidney disease with stage 1 through stage 4 chronic kidney disease, or unspecified chronic kidney disease: Secondary | ICD-10-CM | POA: Diagnosis not present

## 2016-04-05 DIAGNOSIS — N182 Chronic kidney disease, stage 2 (mild): Secondary | ICD-10-CM | POA: Diagnosis not present

## 2016-04-05 DIAGNOSIS — N63 Unspecified lump in breast: Secondary | ICD-10-CM | POA: Diagnosis not present

## 2016-04-05 DIAGNOSIS — L97829 Non-pressure chronic ulcer of other part of left lower leg with unspecified severity: Secondary | ICD-10-CM | POA: Diagnosis not present

## 2016-04-05 DIAGNOSIS — E785 Hyperlipidemia, unspecified: Secondary | ICD-10-CM | POA: Diagnosis not present

## 2016-04-05 DIAGNOSIS — E1122 Type 2 diabetes mellitus with diabetic chronic kidney disease: Secondary | ICD-10-CM | POA: Diagnosis not present

## 2016-04-05 DIAGNOSIS — E559 Vitamin D deficiency, unspecified: Secondary | ICD-10-CM | POA: Diagnosis not present

## 2016-04-05 DIAGNOSIS — M797 Fibromyalgia: Secondary | ICD-10-CM | POA: Diagnosis not present

## 2016-04-05 DIAGNOSIS — Z9981 Dependence on supplemental oxygen: Secondary | ICD-10-CM | POA: Diagnosis not present

## 2016-04-05 DIAGNOSIS — Z6841 Body Mass Index (BMI) 40.0 and over, adult: Secondary | ICD-10-CM | POA: Diagnosis not present

## 2016-04-05 DIAGNOSIS — K219 Gastro-esophageal reflux disease without esophagitis: Secondary | ICD-10-CM | POA: Diagnosis not present

## 2016-04-05 DIAGNOSIS — M069 Rheumatoid arthritis, unspecified: Secondary | ICD-10-CM | POA: Diagnosis not present

## 2016-04-07 DIAGNOSIS — N182 Chronic kidney disease, stage 2 (mild): Secondary | ICD-10-CM | POA: Diagnosis not present

## 2016-04-07 DIAGNOSIS — M797 Fibromyalgia: Secondary | ICD-10-CM | POA: Diagnosis not present

## 2016-04-07 DIAGNOSIS — E1122 Type 2 diabetes mellitus with diabetic chronic kidney disease: Secondary | ICD-10-CM | POA: Diagnosis not present

## 2016-04-07 DIAGNOSIS — L97829 Non-pressure chronic ulcer of other part of left lower leg with unspecified severity: Secondary | ICD-10-CM | POA: Diagnosis not present

## 2016-04-07 DIAGNOSIS — N63 Unspecified lump in breast: Secondary | ICD-10-CM | POA: Diagnosis not present

## 2016-04-07 DIAGNOSIS — I129 Hypertensive chronic kidney disease with stage 1 through stage 4 chronic kidney disease, or unspecified chronic kidney disease: Secondary | ICD-10-CM | POA: Diagnosis not present

## 2016-04-09 DIAGNOSIS — I129 Hypertensive chronic kidney disease with stage 1 through stage 4 chronic kidney disease, or unspecified chronic kidney disease: Secondary | ICD-10-CM | POA: Diagnosis not present

## 2016-04-09 DIAGNOSIS — M797 Fibromyalgia: Secondary | ICD-10-CM | POA: Diagnosis not present

## 2016-04-09 DIAGNOSIS — N182 Chronic kidney disease, stage 2 (mild): Secondary | ICD-10-CM | POA: Diagnosis not present

## 2016-04-09 DIAGNOSIS — N63 Unspecified lump in breast: Secondary | ICD-10-CM | POA: Diagnosis not present

## 2016-04-09 DIAGNOSIS — L97829 Non-pressure chronic ulcer of other part of left lower leg with unspecified severity: Secondary | ICD-10-CM | POA: Diagnosis not present

## 2016-04-09 DIAGNOSIS — E1122 Type 2 diabetes mellitus with diabetic chronic kidney disease: Secondary | ICD-10-CM | POA: Diagnosis not present

## 2016-04-11 DIAGNOSIS — N182 Chronic kidney disease, stage 2 (mild): Secondary | ICD-10-CM | POA: Diagnosis not present

## 2016-04-11 DIAGNOSIS — L97829 Non-pressure chronic ulcer of other part of left lower leg with unspecified severity: Secondary | ICD-10-CM | POA: Diagnosis not present

## 2016-04-11 DIAGNOSIS — N63 Unspecified lump in breast: Secondary | ICD-10-CM | POA: Diagnosis not present

## 2016-04-11 DIAGNOSIS — M797 Fibromyalgia: Secondary | ICD-10-CM | POA: Diagnosis not present

## 2016-04-11 DIAGNOSIS — E1122 Type 2 diabetes mellitus with diabetic chronic kidney disease: Secondary | ICD-10-CM | POA: Diagnosis not present

## 2016-04-11 DIAGNOSIS — I129 Hypertensive chronic kidney disease with stage 1 through stage 4 chronic kidney disease, or unspecified chronic kidney disease: Secondary | ICD-10-CM | POA: Diagnosis not present

## 2016-04-15 DIAGNOSIS — L97829 Non-pressure chronic ulcer of other part of left lower leg with unspecified severity: Secondary | ICD-10-CM | POA: Diagnosis not present

## 2016-04-15 DIAGNOSIS — N63 Unspecified lump in breast: Secondary | ICD-10-CM | POA: Diagnosis not present

## 2016-04-15 DIAGNOSIS — E1122 Type 2 diabetes mellitus with diabetic chronic kidney disease: Secondary | ICD-10-CM | POA: Diagnosis not present

## 2016-04-15 DIAGNOSIS — I129 Hypertensive chronic kidney disease with stage 1 through stage 4 chronic kidney disease, or unspecified chronic kidney disease: Secondary | ICD-10-CM | POA: Diagnosis not present

## 2016-04-15 DIAGNOSIS — N182 Chronic kidney disease, stage 2 (mild): Secondary | ICD-10-CM | POA: Diagnosis not present

## 2016-04-15 DIAGNOSIS — M797 Fibromyalgia: Secondary | ICD-10-CM | POA: Diagnosis not present

## 2016-04-18 DIAGNOSIS — E1122 Type 2 diabetes mellitus with diabetic chronic kidney disease: Secondary | ICD-10-CM | POA: Diagnosis not present

## 2016-04-18 DIAGNOSIS — N182 Chronic kidney disease, stage 2 (mild): Secondary | ICD-10-CM | POA: Diagnosis not present

## 2016-04-18 DIAGNOSIS — N63 Unspecified lump in breast: Secondary | ICD-10-CM | POA: Diagnosis not present

## 2016-04-18 DIAGNOSIS — M797 Fibromyalgia: Secondary | ICD-10-CM | POA: Diagnosis not present

## 2016-04-18 DIAGNOSIS — I129 Hypertensive chronic kidney disease with stage 1 through stage 4 chronic kidney disease, or unspecified chronic kidney disease: Secondary | ICD-10-CM | POA: Diagnosis not present

## 2016-04-18 DIAGNOSIS — L97829 Non-pressure chronic ulcer of other part of left lower leg with unspecified severity: Secondary | ICD-10-CM | POA: Diagnosis not present

## 2016-04-22 DIAGNOSIS — N63 Unspecified lump in breast: Secondary | ICD-10-CM | POA: Diagnosis not present

## 2016-04-22 DIAGNOSIS — N182 Chronic kidney disease, stage 2 (mild): Secondary | ICD-10-CM | POA: Diagnosis not present

## 2016-04-22 DIAGNOSIS — I129 Hypertensive chronic kidney disease with stage 1 through stage 4 chronic kidney disease, or unspecified chronic kidney disease: Secondary | ICD-10-CM | POA: Diagnosis not present

## 2016-04-22 DIAGNOSIS — E1122 Type 2 diabetes mellitus with diabetic chronic kidney disease: Secondary | ICD-10-CM | POA: Diagnosis not present

## 2016-04-22 DIAGNOSIS — L97829 Non-pressure chronic ulcer of other part of left lower leg with unspecified severity: Secondary | ICD-10-CM | POA: Diagnosis not present

## 2016-04-22 DIAGNOSIS — M797 Fibromyalgia: Secondary | ICD-10-CM | POA: Diagnosis not present

## 2016-04-25 DIAGNOSIS — L97829 Non-pressure chronic ulcer of other part of left lower leg with unspecified severity: Secondary | ICD-10-CM | POA: Diagnosis not present

## 2016-04-25 DIAGNOSIS — N63 Unspecified lump in breast: Secondary | ICD-10-CM | POA: Diagnosis not present

## 2016-04-25 DIAGNOSIS — E1122 Type 2 diabetes mellitus with diabetic chronic kidney disease: Secondary | ICD-10-CM | POA: Diagnosis not present

## 2016-04-25 DIAGNOSIS — N182 Chronic kidney disease, stage 2 (mild): Secondary | ICD-10-CM | POA: Diagnosis not present

## 2016-04-25 DIAGNOSIS — M797 Fibromyalgia: Secondary | ICD-10-CM | POA: Diagnosis not present

## 2016-04-25 DIAGNOSIS — I129 Hypertensive chronic kidney disease with stage 1 through stage 4 chronic kidney disease, or unspecified chronic kidney disease: Secondary | ICD-10-CM | POA: Diagnosis not present

## 2016-05-01 DIAGNOSIS — N182 Chronic kidney disease, stage 2 (mild): Secondary | ICD-10-CM | POA: Diagnosis not present

## 2016-05-01 DIAGNOSIS — N63 Unspecified lump in breast: Secondary | ICD-10-CM | POA: Diagnosis not present

## 2016-05-01 DIAGNOSIS — L97829 Non-pressure chronic ulcer of other part of left lower leg with unspecified severity: Secondary | ICD-10-CM | POA: Diagnosis not present

## 2016-05-01 DIAGNOSIS — M797 Fibromyalgia: Secondary | ICD-10-CM | POA: Diagnosis not present

## 2016-05-01 DIAGNOSIS — E1122 Type 2 diabetes mellitus with diabetic chronic kidney disease: Secondary | ICD-10-CM | POA: Diagnosis not present

## 2016-05-01 DIAGNOSIS — I129 Hypertensive chronic kidney disease with stage 1 through stage 4 chronic kidney disease, or unspecified chronic kidney disease: Secondary | ICD-10-CM | POA: Diagnosis not present

## 2016-05-07 DIAGNOSIS — N63 Unspecified lump in breast: Secondary | ICD-10-CM | POA: Diagnosis not present

## 2016-05-07 DIAGNOSIS — N182 Chronic kidney disease, stage 2 (mild): Secondary | ICD-10-CM | POA: Diagnosis not present

## 2016-05-07 DIAGNOSIS — E1122 Type 2 diabetes mellitus with diabetic chronic kidney disease: Secondary | ICD-10-CM | POA: Diagnosis not present

## 2016-05-07 DIAGNOSIS — I129 Hypertensive chronic kidney disease with stage 1 through stage 4 chronic kidney disease, or unspecified chronic kidney disease: Secondary | ICD-10-CM | POA: Diagnosis not present

## 2016-05-07 DIAGNOSIS — L97829 Non-pressure chronic ulcer of other part of left lower leg with unspecified severity: Secondary | ICD-10-CM | POA: Diagnosis not present

## 2016-05-07 DIAGNOSIS — M797 Fibromyalgia: Secondary | ICD-10-CM | POA: Diagnosis not present

## 2016-05-13 DIAGNOSIS — E1122 Type 2 diabetes mellitus with diabetic chronic kidney disease: Secondary | ICD-10-CM | POA: Diagnosis not present

## 2016-05-13 DIAGNOSIS — L97829 Non-pressure chronic ulcer of other part of left lower leg with unspecified severity: Secondary | ICD-10-CM | POA: Diagnosis not present

## 2016-05-13 DIAGNOSIS — N63 Unspecified lump in breast: Secondary | ICD-10-CM | POA: Diagnosis not present

## 2016-05-13 DIAGNOSIS — I129 Hypertensive chronic kidney disease with stage 1 through stage 4 chronic kidney disease, or unspecified chronic kidney disease: Secondary | ICD-10-CM | POA: Diagnosis not present

## 2016-05-13 DIAGNOSIS — N182 Chronic kidney disease, stage 2 (mild): Secondary | ICD-10-CM | POA: Diagnosis not present

## 2016-05-13 DIAGNOSIS — M797 Fibromyalgia: Secondary | ICD-10-CM | POA: Diagnosis not present

## 2016-05-22 DIAGNOSIS — E1122 Type 2 diabetes mellitus with diabetic chronic kidney disease: Secondary | ICD-10-CM | POA: Diagnosis not present

## 2016-05-22 DIAGNOSIS — N182 Chronic kidney disease, stage 2 (mild): Secondary | ICD-10-CM | POA: Diagnosis not present

## 2016-05-22 DIAGNOSIS — L97829 Non-pressure chronic ulcer of other part of left lower leg with unspecified severity: Secondary | ICD-10-CM | POA: Diagnosis not present

## 2016-05-22 DIAGNOSIS — N63 Unspecified lump in breast: Secondary | ICD-10-CM | POA: Diagnosis not present

## 2016-05-22 DIAGNOSIS — I129 Hypertensive chronic kidney disease with stage 1 through stage 4 chronic kidney disease, or unspecified chronic kidney disease: Secondary | ICD-10-CM | POA: Diagnosis not present

## 2016-05-22 DIAGNOSIS — M797 Fibromyalgia: Secondary | ICD-10-CM | POA: Diagnosis not present

## 2016-05-25 ENCOUNTER — Emergency Department (HOSPITAL_COMMUNITY)
Admission: EM | Admit: 2016-05-25 | Discharge: 2016-05-25 | Disposition: A | Discharge status: Home / Self Care | Payer: Medicare Other | Attending: Emergency Medicine | Admitting: Emergency Medicine

## 2016-05-25 ENCOUNTER — Encounter (HOSPITAL_COMMUNITY): Payer: Self-pay | Admitting: Emergency Medicine

## 2016-05-25 DIAGNOSIS — R10814 Left lower quadrant abdominal tenderness: Secondary | ICD-10-CM | POA: Diagnosis not present

## 2016-05-25 DIAGNOSIS — R531 Weakness: Secondary | ICD-10-CM | POA: Diagnosis not present

## 2016-05-25 DIAGNOSIS — Z79899 Other long term (current) drug therapy: Secondary | ICD-10-CM | POA: Insufficient documentation

## 2016-05-25 DIAGNOSIS — R1013 Epigastric pain: Secondary | ICD-10-CM | POA: Diagnosis not present

## 2016-05-25 DIAGNOSIS — F419 Anxiety disorder, unspecified: Secondary | ICD-10-CM | POA: Diagnosis not present

## 2016-05-25 DIAGNOSIS — Z791 Long term (current) use of non-steroidal anti-inflammatories (NSAID): Secondary | ICD-10-CM | POA: Diagnosis not present

## 2016-05-25 DIAGNOSIS — R404 Transient alteration of awareness: Secondary | ICD-10-CM | POA: Diagnosis not present

## 2016-05-25 DIAGNOSIS — E119 Type 2 diabetes mellitus without complications: Secondary | ICD-10-CM | POA: Diagnosis not present

## 2016-05-25 DIAGNOSIS — I10 Essential (primary) hypertension: Secondary | ICD-10-CM | POA: Diagnosis not present

## 2016-05-25 LAB — COMPREHENSIVE METABOLIC PANEL
ALT: 82 U/L — AB (ref 14–54)
AST: 71 U/L — ABNORMAL HIGH (ref 15–41)
Albumin: 3.6 g/dL (ref 3.5–5.0)
Alkaline Phosphatase: 141 U/L — ABNORMAL HIGH (ref 38–126)
Anion gap: 10 (ref 5–15)
BUN: 13 mg/dL (ref 6–20)
CALCIUM: 8.5 mg/dL — AB (ref 8.9–10.3)
CHLORIDE: 97 mmol/L — AB (ref 101–111)
CO2: 29 mmol/L (ref 22–32)
CREATININE: 0.69 mg/dL (ref 0.44–1.00)
Glucose, Bld: 133 mg/dL — ABNORMAL HIGH (ref 65–99)
Potassium: 3.3 mmol/L — ABNORMAL LOW (ref 3.5–5.1)
Sodium: 136 mmol/L (ref 135–145)
TOTAL PROTEIN: 7 g/dL (ref 6.5–8.1)
Total Bilirubin: 0.7 mg/dL (ref 0.3–1.2)

## 2016-05-25 LAB — URINALYSIS, ROUTINE W REFLEX MICROSCOPIC
BILIRUBIN URINE: NEGATIVE
Glucose, UA: NEGATIVE mg/dL
HGB URINE DIPSTICK: NEGATIVE
KETONES UR: 40 mg/dL — AB
Leukocytes, UA: NEGATIVE
Nitrite: NEGATIVE
PROTEIN: NEGATIVE mg/dL
Specific Gravity, Urine: 1.025 (ref 1.005–1.030)
pH: 7 (ref 5.0–8.0)

## 2016-05-25 LAB — CBC
HCT: 37.7 % (ref 36.0–46.0)
Hemoglobin: 11.7 g/dL — ABNORMAL LOW (ref 12.0–15.0)
MCH: 26.6 pg (ref 26.0–34.0)
MCHC: 31 g/dL (ref 30.0–36.0)
MCV: 85.7 fL (ref 78.0–100.0)
PLATELETS: 352 10*3/uL (ref 150–400)
RBC: 4.4 MIL/uL (ref 3.87–5.11)
RDW: 16 % — ABNORMAL HIGH (ref 11.5–15.5)
WBC: 10.3 10*3/uL (ref 4.0–10.5)

## 2016-05-25 LAB — LIPASE, BLOOD: LIPASE: 15 U/L (ref 11–51)

## 2016-05-25 MED ORDER — POTASSIUM CHLORIDE CRYS ER 20 MEQ PO TBCR
20.0000 meq | EXTENDED_RELEASE_TABLET | Freq: Once | ORAL | Status: AC
  Administered 2016-05-25: 20 meq via ORAL
  Filled 2016-05-25: qty 1

## 2016-05-25 MED ORDER — FAMOTIDINE IN NACL 20-0.9 MG/50ML-% IV SOLN
20.0000 mg | Freq: Once | INTRAVENOUS | Status: AC
  Administered 2016-05-25: 20 mg via INTRAVENOUS
  Filled 2016-05-25: qty 50

## 2016-05-25 MED ORDER — ONDANSETRON HCL 4 MG/2ML IJ SOLN
4.0000 mg | Freq: Once | INTRAMUSCULAR | Status: AC
  Administered 2016-05-25: 4 mg via INTRAVENOUS
  Filled 2016-05-25: qty 2

## 2016-05-25 MED ORDER — MORPHINE SULFATE (PF) 4 MG/ML IV SOLN
4.0000 mg | Freq: Once | INTRAVENOUS | Status: AC
  Administered 2016-05-25: 4 mg via INTRAVENOUS
  Filled 2016-05-25: qty 1

## 2016-05-25 MED ORDER — LORAZEPAM 2 MG/ML IJ SOLN
1.0000 mg | Freq: Once | INTRAMUSCULAR | Status: AC
  Administered 2016-05-25: 1 mg via INTRAVENOUS
  Filled 2016-05-25: qty 1

## 2016-05-25 MED ORDER — SODIUM CHLORIDE 0.9 % IV BOLUS (SEPSIS)
500.0000 mL | Freq: Once | INTRAVENOUS | Status: AC
  Administered 2016-05-25: 500 mL via INTRAVENOUS

## 2016-05-25 MED ORDER — ALUM & MAG HYDROXIDE-SIMETH 200-200-20 MG/5ML PO SUSP
30.0000 mL | Freq: Once | ORAL | Status: AC
  Administered 2016-05-25: 30 mL via ORAL
  Filled 2016-05-25: qty 30

## 2016-05-25 NOTE — ED Notes (Signed)
Pt given turkey sandwich per request

## 2016-05-25 NOTE — ED Provider Notes (Signed)
[facility] DEPT Provider Note   CSN: [id number] Arrival date & time: 05/25/16  [id number]     History   Chief Complaint Chief Complaint  Patient presents with  . Abdominal Pain    HPI Jasmine Huff is a 60 y.o. female.  Patient c/o epigastric pain for the past 3-4 days. Pain persistent, constant, dull, moderate, non radiating. No specific exacerbating or alleviating factors. Nausea. No vomiting. Had normal bm yesterday. No fever or chills. States feels is due to recent stress/anxiety related to money issues. Denies depression. No chest pain or sob. No back or flank pain. No gu c/o. Denies hx pancreatitis, or pud. Hx prior cholecystectomy, colostomy with subsequent takedown colostomy.     Abdominal Pain   Associated symptoms include nausea. Pertinent negatives include fever, diarrhea, constipation, dysuria and headaches.    Past Medical History:  Diagnosis Date  . Allergy   . Anemia   . Anxiety    takes Xanax daily as needed  . Arthritis    RA  . Chronic back pain    bulding disc and arthritis.  . Chronic pain syndrome   . Depression    takes Latuda,Wellbutrin,Lexapro daily  . Fibromyalgia   . GERD (gastroesophageal reflux disease) 01/21/2011   takes omeprazole daily  . H/O hiatal hernia   . History of migraine    last one a wk ago;takes Excedrine as needed  . Hypertension    takes Losartan daily  . Insomnia    takes Ambien nightly  . Joint pain   . Knee pain   . Migraine   . Nocturia   . Panic attacks   . Restless leg    takes Requip nightly  . Shortness of breath dyspnea    with exertion  . Sleep apnea    CPAP  . Urinary frequency   . Urinary urgency   . Urination frequency   . Weakness    left arm    Patient Active Problem List   Diagnosis Date Noted  . Morbid (severe) obesity due to excess calories ([place]) 01/11/2016  . Chronic respiratory failure with hypoxia and hypercapnia ([place]) 01/11/2016  . Cellulitis 12/28/2015  . Respiratory failure,  chronic ([place]) 12/28/2015  . Diabetes mellitus due to underlying condition with hyperglycemia ([place]) 12/28/2015  . 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 Acute dyspnea   . HCAP (healthcare-associated pneumonia)   . Drowsiness   . Morbid obesity ([place])   . Hypoxia 11/13/2015  . Acute on chronic respiratory failure with hypoxia ([place]) 11/13/2015  . Altered mental status 11/08/2015  . Left leg cellulitis 11/08/2015  . Fungal infection of skin of abdomen 11/08/2015  . S/P shoulder replacement 09/27/2015  . Anemia 09/13/2012  . OSA (obstructive sleep apnea) 03/24/2012  . Diabetes mellitus ([place]) 02/23/2012  . Severe episode of recurrent major depressive disorder ([place]) 09/25/2011  . Hyperglycemia 01/21/2011  . GERD (gastroesophageal reflux disease) 01/21/2011  . Hypertension 10/23/2010  . Fibromyalgia 10/23/2010  . Chronic pain 10/23/2010  . Urinary frequency 10/23/2010  . Other and unspecified hyperlipidemia 10/23/2010  . Obesity 10/23/2010  . Insomnia 10/23/2010  . Depression 10/23/2010    Past Surgical History:  Procedure Laterality Date  . ABDOMINAL HYSTERECTOMY    . CHOLECYSTECTOMY  10 yrs ago  . COLON SURGERY  Oct 2013   perf bowel w/ colostomy  . COLONOSCOPY N/A 11/12/2012   Procedure: COLONOSCOPY;  Surgeon: Leighton Ruff, MD;  Location: [facility];  Service: Endoscopy;  Laterality:  N/A;  . COLONOSCOPY    . COLOSTOMY  04/04/2012   Procedure: COLOSTOMY;  Surgeon: Leighton Ruff, MD;  Location: [facility] ORS;  Service: General;  Laterality: N/A;  . COLOSTOMY REVERSAL N/A 02/17/2013   Procedure:  LAPAROSCOPIC COLOSTOMY REVERSAL; SMALL BOWEL RESECTION; LYSIS OF ADHESIONS ;  Surgeon: Leighton Ruff, MD;  Location: [facility] OR[facility];  Service: General;  Laterality: N/A;  . LAPAROTOMY  04/04/2012   Procedure: EXPLORATORY LAPAROTOMY;  Surgeon: Leighton Ruff, MD;  Location: [facility];  Service: General;  Laterality: N/A;  left colectomy  . Left Hemicolectomy with  splenic flexure mobilization and end colostomy  04/04/2012  . REFRACTIVE SURGERY    . right ovary removal    . TOTAL SHOULDER ARTHROPLASTY Left 09/27/2015   Procedure: TOTAL SHOULDER ARTHROPLASTY;  Surgeon: Tania Ade, MD;  Location: [facility];  Service: Orthopedics;  Laterality: Left;  Left shoulder arthroplasty    OB History    No data available       Home Medications    Prior to Admission medications   Medication Sig Start Date End Date Taking? Authorizing Provider  acetaminophen (TYLENOL) 500 MG tablet Take 1,000 mg by mouth every 6 (six) hours as needed for moderate pain.    Yes Historical Provider, MD  ALPRAZolam Duanne Moron) 0.5 MG tablet Take 1 tablet (0.5 mg total) by mouth daily as needed for anxiety. 12/31/15  Yes Bonnielee Haff, MD  amLODipine (NORVASC) 5 MG tablet Take 1 tablet (5 mg total) by mouth daily. 11/16/15  Yes Belkys A Regalado, MD  bismuth subsalicylate (PEPTO BISMOL) 262 MG/15ML suspension Take 30 mLs by mouth every 6 (six) hours as needed (nausea).   Yes Historical Provider, MD  buPROPion (WELLBUTRIN XL) 150 MG 24 hr tablet Take 450 mg by mouth every morning.   Yes Historical Provider, MD  cetirizine (ZYRTEC) 10 MG tablet Take 10 mg by mouth every evening.    Yes Historical Provider, MD  Cholecalciferol (VITAMIN D3) 5000 units TABS Take 5,000 Units by mouth daily.   Yes Historical Provider, MD  Docusate Sodium (COLACE PO) Take 1 capsule by mouth daily as needed (constipation).   Yes Historical Provider, MD  escitalopram (LEXAPRO) 20 MG tablet Take 40 mg by mouth every morning.  10/12/12  Yes Historical Provider, MD  hydroxychloroquine (PLAQUENIL) 200 MG tablet Take 100-200 mg by mouth 2 (two) times daily. 200mg  in the morning, 100mg  in the evening 08/04/11  Yes Historical Provider, MD  Iron-FA-B Cmp-C-Biot-Probiotic (FUSION PLUS) CAPS Take 1 capsule by mouth daily. 11/29/15  Yes Historical Provider, MD  morphine (MS CONTIN) 15 MG 12 hr tablet Take 1 tablet (15 mg total) by  mouth 2 (two) times daily. 12/31/15  Yes Bonnielee Haff, MD  nystatin cream (MYCOSTATIN) Apply 1 application topically 2 (two) times daily. Uses for yeast infection   Yes Historical Provider, MD  omeprazole (PRILOSEC) 40 MG capsule Take 40 mg by mouth every evening.  11/16/12  Yes Historical Provider, MD  oxyCODONE (OXY IR/ROXICODONE) 5 MG immediate release tablet Take 1 tablet (5 mg total) by mouth every 8 (eight) hours as needed for moderate pain. Patient taking differently: Take 5 mg by mouth every 6 (six) hours as needed for moderate pain.  12/31/15  Yes Bonnielee Haff, MD  OXYGEN Inhale into the lungs. 2lpm with sleep and as needed [place]   Yes Historical Provider, MD  doxycycline (VIBRA-TABS) 100 MG tablet Take 1 tablet (100 mg total) by mouth every 12 (twelve) hours. For 2 weeks Patient not taking:  Reported on 05/25/2016 12/31/15   Bonnielee Haff, MD  senna-docusate (SENOKOT-S) 8.6-50 MG tablet Take 1 tablet by mouth 2 (two) times daily. Patient not taking: Reported on 05/25/2016 12/31/15   Bonnielee Haff, MD    Family History Family History  Problem Relation Age of Onset  . Depression Mother   . Depression Father   . Alcohol abuse Brother   . Depression Brother   . Depression Maternal Grandfather   . Depression Other     Social History Social History  Substance Use Topics  . Smoking status: Never Smoker  . Smokeless tobacco: Never Used  . Alcohol use No     Allergies   Asenapine; Saphris [asenapine maleate]; Seroquel [quetiapine fumarate]; Iodinated diagnostic agents; Pregabalin; and Buprenorphine   Review of Systems Review of Systems  Constitutional: Negative for fever.  HENT: Negative for sore throat.   Eyes: Negative for redness.  Respiratory: Negative for cough and shortness of breath.   Cardiovascular: Negative for chest pain.  Gastrointestinal: Positive for abdominal pain and nausea. Negative for constipation and diarrhea.  Genitourinary: Negative for dysuria and  flank pain.  Musculoskeletal: Negative for back pain and neck pain.  Skin: Negative for rash.  Neurological: Negative for headaches.  Hematological: Does not bruise/bleed easily.  Psychiatric/Behavioral: Negative for confusion.     Physical Exam Updated Vital Signs BP 161/88 (BP Location: Left Arm)   Pulse 90   Temp 98.7 F (37.1 C) (Oral)   Resp 18   Ht 5\' 3"  (1.6 m)   Wt (!) 149.7 kg   SpO2 96%   BMI 58.46 kg/m   Physical Exam  Constitutional: She appears well-developed and well-nourished. No distress.  HENT:  Mouth/Throat: Oropharynx is clear and moist.  Eyes: Conjunctivae are normal. No scleral icterus.  Neck: Neck supple. No tracheal deviation present.  Cardiovascular: Normal rate, regular rhythm, normal heart sounds and intact distal pulses.   Pulmonary/Chest: Effort normal and breath sounds normal. No respiratory distress.  Abdominal: Soft. Normal appearance and bowel sounds are normal. She exhibits no distension and no mass. There is tenderness. There is no rebound and no guarding. No hernia.  Mild epigastric tenderness.   Genitourinary:  Genitourinary Comments: No cva tenderness  Musculoskeletal: She exhibits no edema.  Neurological: She is alert.  Skin: Skin is warm and dry. No rash noted. She is not diaphoretic.  Psychiatric:  Anxious appearing.   Nursing note and vitals reviewed.    ED Treatments / Results  Labs (all labs ordered are listed, but only abnormal results are displayed) Results for orders placed or performed during the hospital encounter of 05/25/16  CBC  Result Value Ref Range   WBC 10.3 4.0 - 10.5 K/uL   RBC 4.40 3.87 - 5.11 MIL/uL   Hemoglobin 11.7 (L) 12.0 - 15.0 g/dL   HCT 37.7 36.0 - 46.0 %   MCV 85.7 78.0 - 100.0 fL   MCH 26.6 26.0 - 34.0 pg   MCHC 31.0 30.0 - 36.0 g/dL   RDW 16.0 (H) 11.5 - 15.5 %   Platelets 352 150 - 400 K/uL  Comprehensive metabolic panel  Result Value Ref Range   Sodium 136 135 - 145 mmol/L   Potassium  3.3 (L) 3.5 - 5.1 mmol/L   Chloride 97 (L) 101 - 111 mmol/L   CO2 29 22 - 32 mmol/L   Glucose, Bld 133 (H) 65 - 99 mg/dL   BUN 13 6 - 20 mg/dL   Creatinine, Ser 0.69 0.44 - 1.00  mg/dL   Calcium 8.5 (L) 8.9 - 10.3 mg/dL   Total Protein 7.0 6.5 - 8.1 g/dL   Albumin 3.6 3.5 - 5.0 g/dL   AST 71 (H) 15 - 41 U/L   ALT 82 (H) 14 - 54 U/L   Alkaline Phosphatase 141 (H) 38 - 126 U/L   Total Bilirubin 0.7 0.3 - 1.2 mg/dL   GFR calc non Af Amer >60 >60 mL/min   GFR calc Af Amer >60 >60 mL/min   Anion gap 10 5 - 15  Lipase, blood  Result Value Ref Range   Lipase 15 11 - 51 U/L  Urinalysis, Routine w reflex microscopic (not at [facility])  Result Value Ref Range   Color, Urine YELLOW YELLOW   APPearance CLOUDY (A) CLEAR   Specific Gravity, Urine 1.025 1.005 - 1.030   pH 7.0 5.0 - 8.0   Glucose, UA NEGATIVE NEGATIVE mg/dL   Hgb urine dipstick NEGATIVE NEGATIVE   Bilirubin Urine NEGATIVE NEGATIVE   Ketones, ur 40 (A) NEGATIVE mg/dL   Protein, ur NEGATIVE NEGATIVE mg/dL   Nitrite NEGATIVE NEGATIVE   Leukocytes, UA NEGATIVE NEGATIVE    EKG  EKG Interpretation None       Radiology No results found.  Procedures Procedures (including critical care time)  Medications Ordered in ED Medications  sodium chloride 0.9 % bolus 500 mL (not administered)  famotidine (PEPCID) IVPB 20 mg premix (not administered)  morphine 4 MG/ML injection 4 mg (not administered)  LORazepam (ATIVAN) injection 1 mg (not administered)  ondansetron (ZOFRAN) injection 4 mg (not administered)     Initial Impression / Assessment and Plan / ED Course  I have reviewed the triage vital signs and the nursing notes.  Pertinent labs & imaging results that were available during my care of the patient were reviewed by me and considered in my medical decision making (see chart for details).  Clinical Course     Labs sent.   Iv ns bolus.  pepcid iv. Morphine iv. Ativan for anxiety. zofran for nausea.   Reviewed  nursing notes and prior charts for additional history.   Recheck pt requests food/meal.  Ate and drank well. No vomiting.   Recheck abd soft nt.  Discussed mild elev lft w pt.  Remote hx cholecystectomy. abd is soft nt. Afeb.     Final Clinical Impressions(s) / ED Diagnoses   Final diagnoses:  None    New Prescriptions New Prescriptions   No medications on file     Lajean Saver, MD 05/25/16 1158

## 2016-05-25 NOTE — ED Triage Notes (Signed)
Pt presented from [place]. EMS for evaluation of abd pain for the last 4 days. Pt reports having prior history of complex abd sx with colostomy. No colostomy present at this time. Pt reports dull pain with nausea throughout abd. Pt reports nausea as well. No IV access obtained by EMS.

## 2016-05-25 NOTE — Discharge Instructions (Signed)
It was our pleasure to provide your ER care today - we hope that you feel better.  Take your acid blocker medication as need.  You may also try pepcid and/or maalox as need for symptom relief.  For anxiety, take your medication as need.   From today's lab tests, a couple of your liver function tests are mildly elevated (AST 71, ALT 82) - follow up with your doctor in the next 1-2 weeks, discuss these results with them.  Also, avoid taking acetaminophen, and avoid alcohol use.   Follow up with your doctor in the coming week for recheck - call office tomorrow to arrange follow up with them.  Return to ER if worse, new symptoms, fevers, new or worsening/severe pain, persistent vomiting, other concern.   You were given medication in the ER - no driving for the next 4 hours.

## 2016-05-25 NOTE — ED Notes (Signed)
Ginger ale given for fluid challenge per patient's request. Patient took one sip and states she does not want to think about food now. Encouraged patient to drink. Patient states the nausea did not fully go away, but much better.

## 2016-05-25 NOTE — ED Notes (Signed)
Informed by Tech that pt feels anxious and would like some Ativan.  Pt also requesting to eat.  Dr. Ashok Cordia made aware of pt's request.  Pt is ok to eat per Dr. Ashok Cordia.

## 2016-05-26 DIAGNOSIS — E1122 Type 2 diabetes mellitus with diabetic chronic kidney disease: Secondary | ICD-10-CM | POA: Diagnosis not present

## 2016-05-26 DIAGNOSIS — M797 Fibromyalgia: Secondary | ICD-10-CM | POA: Diagnosis not present

## 2016-05-26 DIAGNOSIS — L97829 Non-pressure chronic ulcer of other part of left lower leg with unspecified severity: Secondary | ICD-10-CM | POA: Diagnosis not present

## 2016-05-26 DIAGNOSIS — N182 Chronic kidney disease, stage 2 (mild): Secondary | ICD-10-CM | POA: Diagnosis not present

## 2016-05-26 DIAGNOSIS — I129 Hypertensive chronic kidney disease with stage 1 through stage 4 chronic kidney disease, or unspecified chronic kidney disease: Secondary | ICD-10-CM | POA: Diagnosis not present

## 2016-05-26 DIAGNOSIS — N63 Unspecified lump in breast: Secondary | ICD-10-CM | POA: Diagnosis not present

## 2016-06-10 DIAGNOSIS — F331 Major depressive disorder, recurrent, moderate: Secondary | ICD-10-CM | POA: Diagnosis not present

## 2016-06-10 DIAGNOSIS — F411 Generalized anxiety disorder: Secondary | ICD-10-CM | POA: Diagnosis not present

## 2016-06-11 DIAGNOSIS — F411 Generalized anxiety disorder: Secondary | ICD-10-CM | POA: Diagnosis not present

## 2016-06-11 DIAGNOSIS — F331 Major depressive disorder, recurrent, moderate: Secondary | ICD-10-CM | POA: Diagnosis not present

## 2016-06-26 DIAGNOSIS — F331 Major depressive disorder, recurrent, moderate: Secondary | ICD-10-CM | POA: Diagnosis not present

## 2016-06-26 DIAGNOSIS — F411 Generalized anxiety disorder: Secondary | ICD-10-CM | POA: Diagnosis not present

## 2016-07-03 DIAGNOSIS — G894 Chronic pain syndrome: Secondary | ICD-10-CM | POA: Diagnosis not present

## 2016-07-03 DIAGNOSIS — M0579 Rheumatoid arthritis with rheumatoid factor of multiple sites without organ or systems involvement: Secondary | ICD-10-CM | POA: Diagnosis not present

## 2016-07-03 DIAGNOSIS — M7542 Impingement syndrome of left shoulder: Secondary | ICD-10-CM | POA: Diagnosis not present

## 2016-07-03 DIAGNOSIS — M1710 Unilateral primary osteoarthritis, unspecified knee: Secondary | ICD-10-CM | POA: Diagnosis not present

## 2016-08-18 ENCOUNTER — Other Ambulatory Visit: Payer: Self-pay | Admitting: Family Medicine

## 2016-08-18 DIAGNOSIS — I96 Gangrene, not elsewhere classified: Secondary | ICD-10-CM

## 2016-08-29 DIAGNOSIS — M1710 Unilateral primary osteoarthritis, unspecified knee: Secondary | ICD-10-CM | POA: Diagnosis not present

## 2016-08-29 DIAGNOSIS — G894 Chronic pain syndrome: Secondary | ICD-10-CM | POA: Diagnosis not present

## 2016-09-02 DIAGNOSIS — H66003 Acute suppurative otitis media without spontaneous rupture of ear drum, bilateral: Secondary | ICD-10-CM | POA: Diagnosis not present

## 2016-09-11 ENCOUNTER — Other Ambulatory Visit: Payer: Self-pay

## 2016-10-06 DIAGNOSIS — G2581 Restless legs syndrome: Secondary | ICD-10-CM | POA: Diagnosis not present

## 2016-10-06 DIAGNOSIS — G4733 Obstructive sleep apnea (adult) (pediatric): Secondary | ICD-10-CM | POA: Diagnosis not present

## 2016-10-06 DIAGNOSIS — F3341 Major depressive disorder, recurrent, in partial remission: Secondary | ICD-10-CM | POA: Diagnosis not present

## 2016-10-06 DIAGNOSIS — F411 Generalized anxiety disorder: Secondary | ICD-10-CM | POA: Diagnosis not present

## 2016-10-30 DIAGNOSIS — G2581 Restless legs syndrome: Secondary | ICD-10-CM | POA: Diagnosis not present

## 2016-10-30 DIAGNOSIS — F411 Generalized anxiety disorder: Secondary | ICD-10-CM | POA: Diagnosis not present

## 2016-10-30 DIAGNOSIS — F341 Dysthymic disorder: Secondary | ICD-10-CM | POA: Diagnosis not present

## 2016-10-30 DIAGNOSIS — F3341 Major depressive disorder, recurrent, in partial remission: Secondary | ICD-10-CM | POA: Diagnosis not present

## 2016-11-19 DIAGNOSIS — I890 Lymphedema, not elsewhere classified: Secondary | ICD-10-CM | POA: Diagnosis not present

## 2016-11-19 DIAGNOSIS — L814 Other melanin hyperpigmentation: Secondary | ICD-10-CM | POA: Diagnosis not present

## 2016-11-24 ENCOUNTER — Other Ambulatory Visit (INDEPENDENT_AMBULATORY_CARE_PROVIDER_SITE_OTHER): Payer: Self-pay | Admitting: Rheumatology

## 2016-11-24 NOTE — Telephone Encounter (Signed)
Attempted to return patient's call. Left message for patient to call the office.

## 2016-11-24 NOTE — Telephone Encounter (Signed)
Ok 30d , sch appt.

## 2016-11-24 NOTE — Telephone Encounter (Signed)
Please call patient in regards to her PLQ

## 2016-11-24 NOTE — Telephone Encounter (Signed)
Patient returned call to the office. Patient states she is having crippling pain in her knees, shoulders and some in her back. Patient states it hurts to stand, sit, and to walk. Patient states she has run out of PLQ. Patient states she is out refills. Patient states she has moved to [place]. Patient states in that area she has to see a PCP before being able to be referred to a rheumatologist. Patient states it will be 6 weeks before she can get in with the PCP. Patient is requesting a refill on her PLQ.   Last Visit: 04/03/16 Labs: 04/11/16 WNL PLQ EYE: 07/2015 Patient states she has a PLQ eye exam scheduled for 2 weeks from today.   Okay to refill PLQ?

## 2016-11-25 ENCOUNTER — Telehealth: Payer: Self-pay | Admitting: Rheumatology

## 2016-11-25 ENCOUNTER — Other Ambulatory Visit (INDEPENDENT_AMBULATORY_CARE_PROVIDER_SITE_OTHER): Payer: Self-pay | Admitting: Rheumatology

## 2016-11-25 MED ORDER — HYDROXYCHLOROQUINE SULFATE 200 MG PO TABS: 100.0000 mg | ORAL_TABLET | Freq: Two times a day (BID) | ORAL | 0 refills | Status: AC

## 2016-11-25 NOTE — Telephone Encounter (Signed)
Attempted to contact the patient and left message for patient to call the office.  

## 2016-11-25 NOTE — Telephone Encounter (Signed)
Patient returned Andrea's phone call. Please advise.

## 2016-11-25 NOTE — Telephone Encounter (Signed)
Patient advised per Dr. Estanislado Pandy only able to fill a 30 day supply of her PLQ. Patient advised she would need to schedule an appointment in our office and update labs and PLQ eye exam. Patient states she would have to think about it because it is such a far drive.

## 2016-12-15 DIAGNOSIS — F331 Major depressive disorder, recurrent, moderate: Secondary | ICD-10-CM | POA: Diagnosis not present

## 2016-12-15 DIAGNOSIS — G2581 Restless legs syndrome: Secondary | ICD-10-CM | POA: Diagnosis not present

## 2016-12-15 DIAGNOSIS — F411 Generalized anxiety disorder: Secondary | ICD-10-CM | POA: Diagnosis not present

## 2016-12-22 DIAGNOSIS — I10 Essential (primary) hypertension: Secondary | ICD-10-CM | POA: Diagnosis not present

## 2016-12-22 DIAGNOSIS — R0902 Hypoxemia: Secondary | ICD-10-CM | POA: Diagnosis not present

## 2016-12-22 DIAGNOSIS — M797 Fibromyalgia: Secondary | ICD-10-CM | POA: Diagnosis not present

## 2016-12-22 DIAGNOSIS — K219 Gastro-esophageal reflux disease without esophagitis: Secondary | ICD-10-CM | POA: Diagnosis not present

## 2016-12-22 DIAGNOSIS — M059 Rheumatoid arthritis with rheumatoid factor, unspecified: Secondary | ICD-10-CM | POA: Diagnosis not present

## 2016-12-22 DIAGNOSIS — Z1239 Encounter for other screening for malignant neoplasm of breast: Secondary | ICD-10-CM | POA: Diagnosis not present

## 2016-12-22 DIAGNOSIS — G4700 Insomnia, unspecified: Secondary | ICD-10-CM | POA: Diagnosis not present

## 2016-12-22 DIAGNOSIS — F331 Major depressive disorder, recurrent, moderate: Secondary | ICD-10-CM | POA: Diagnosis not present

## 2016-12-22 DIAGNOSIS — G2581 Restless legs syndrome: Secondary | ICD-10-CM | POA: Diagnosis not present

## 2016-12-29 DIAGNOSIS — M19012 Primary osteoarthritis, left shoulder: Secondary | ICD-10-CM | POA: Diagnosis not present

## 2016-12-29 DIAGNOSIS — M1710 Unilateral primary osteoarthritis, unspecified knee: Secondary | ICD-10-CM | POA: Diagnosis not present

## 2016-12-29 DIAGNOSIS — M0579 Rheumatoid arthritis with rheumatoid factor of multiple sites without organ or systems involvement: Secondary | ICD-10-CM | POA: Diagnosis not present

## 2016-12-29 DIAGNOSIS — G894 Chronic pain syndrome: Secondary | ICD-10-CM | POA: Diagnosis not present

## 2017-01-02 DIAGNOSIS — R5383 Other fatigue: Secondary | ICD-10-CM | POA: Diagnosis not present

## 2017-01-02 DIAGNOSIS — M059 Rheumatoid arthritis with rheumatoid factor, unspecified: Secondary | ICD-10-CM | POA: Diagnosis not present

## 2017-01-02 DIAGNOSIS — M797 Fibromyalgia: Secondary | ICD-10-CM | POA: Diagnosis not present

## 2017-01-02 DIAGNOSIS — R7309 Other abnormal glucose: Secondary | ICD-10-CM | POA: Diagnosis not present

## 2017-01-02 DIAGNOSIS — L0390 Cellulitis, unspecified: Secondary | ICD-10-CM | POA: Diagnosis not present

## 2017-01-02 DIAGNOSIS — I87332 Chronic venous hypertension (idiopathic) with ulcer and inflammation of left lower extremity: Secondary | ICD-10-CM | POA: Diagnosis not present

## 2017-01-02 DIAGNOSIS — K219 Gastro-esophageal reflux disease without esophagitis: Secondary | ICD-10-CM | POA: Diagnosis not present

## 2017-01-05 DIAGNOSIS — Z0101 Encounter for examination of eyes and vision with abnormal findings: Secondary | ICD-10-CM | POA: Diagnosis not present

## 2017-01-05 DIAGNOSIS — H2513 Age-related nuclear cataract, bilateral: Secondary | ICD-10-CM | POA: Diagnosis not present

## 2017-01-05 DIAGNOSIS — Z79899 Other long term (current) drug therapy: Secondary | ICD-10-CM | POA: Diagnosis not present

## 2017-01-15 DIAGNOSIS — F411 Generalized anxiety disorder: Secondary | ICD-10-CM | POA: Diagnosis not present

## 2017-01-15 DIAGNOSIS — F3341 Major depressive disorder, recurrent, in partial remission: Secondary | ICD-10-CM | POA: Diagnosis not present

## 2017-01-15 DIAGNOSIS — G2581 Restless legs syndrome: Secondary | ICD-10-CM | POA: Diagnosis not present

## 2017-01-26 DIAGNOSIS — Z79899 Other long term (current) drug therapy: Secondary | ICD-10-CM | POA: Diagnosis not present

## 2017-01-26 DIAGNOSIS — R0602 Shortness of breath: Secondary | ICD-10-CM | POA: Diagnosis not present

## 2017-01-26 DIAGNOSIS — M7989 Other specified soft tissue disorders: Secondary | ICD-10-CM | POA: Diagnosis not present

## 2017-01-26 DIAGNOSIS — R05 Cough: Secondary | ICD-10-CM | POA: Diagnosis not present

## 2017-01-26 DIAGNOSIS — M79604 Pain in right leg: Secondary | ICD-10-CM | POA: Diagnosis not present

## 2017-02-02 DIAGNOSIS — M059 Rheumatoid arthritis with rheumatoid factor, unspecified: Secondary | ICD-10-CM | POA: Diagnosis not present

## 2017-02-16 DIAGNOSIS — M059 Rheumatoid arthritis with rheumatoid factor, unspecified: Secondary | ICD-10-CM | POA: Diagnosis not present

## 2017-02-16 DIAGNOSIS — G2581 Restless legs syndrome: Secondary | ICD-10-CM | POA: Diagnosis not present

## 2017-02-16 DIAGNOSIS — R0902 Hypoxemia: Secondary | ICD-10-CM | POA: Diagnosis not present

## 2017-02-16 DIAGNOSIS — F331 Major depressive disorder, recurrent, moderate: Secondary | ICD-10-CM | POA: Diagnosis not present

## 2017-02-16 DIAGNOSIS — J918 Pleural effusion in other conditions classified elsewhere: Secondary | ICD-10-CM | POA: Diagnosis not present

## 2017-02-16 DIAGNOSIS — G4700 Insomnia, unspecified: Secondary | ICD-10-CM | POA: Diagnosis not present

## 2017-02-16 DIAGNOSIS — B379 Candidiasis, unspecified: Secondary | ICD-10-CM | POA: Diagnosis not present

## 2017-02-16 DIAGNOSIS — M545 Low back pain: Secondary | ICD-10-CM | POA: Diagnosis not present

## 2017-02-16 DIAGNOSIS — M797 Fibromyalgia: Secondary | ICD-10-CM | POA: Diagnosis not present

## 2017-02-16 DIAGNOSIS — K219 Gastro-esophageal reflux disease without esophagitis: Secondary | ICD-10-CM | POA: Diagnosis not present

## 2017-02-16 DIAGNOSIS — I10 Essential (primary) hypertension: Secondary | ICD-10-CM | POA: Diagnosis not present

## 2017-02-23 DIAGNOSIS — E119 Type 2 diabetes mellitus without complications: Secondary | ICD-10-CM | POA: Diagnosis not present

## 2017-02-23 DIAGNOSIS — I509 Heart failure, unspecified: Secondary | ICD-10-CM | POA: Diagnosis not present

## 2017-02-23 DIAGNOSIS — G4733 Obstructive sleep apnea (adult) (pediatric): Secondary | ICD-10-CM | POA: Diagnosis not present

## 2017-02-23 DIAGNOSIS — I10 Essential (primary) hypertension: Secondary | ICD-10-CM | POA: Diagnosis not present

## 2017-02-23 DIAGNOSIS — M069 Rheumatoid arthritis, unspecified: Secondary | ICD-10-CM | POA: Diagnosis not present

## 2017-02-23 DIAGNOSIS — R0602 Shortness of breath: Secondary | ICD-10-CM | POA: Diagnosis not present

## 2017-03-02 DIAGNOSIS — F341 Dysthymic disorder: Secondary | ICD-10-CM | POA: Diagnosis not present

## 2017-03-02 DIAGNOSIS — F3341 Major depressive disorder, recurrent, in partial remission: Secondary | ICD-10-CM | POA: Diagnosis not present

## 2017-03-02 DIAGNOSIS — G2581 Restless legs syndrome: Secondary | ICD-10-CM | POA: Diagnosis not present

## 2017-03-02 DIAGNOSIS — F411 Generalized anxiety disorder: Secondary | ICD-10-CM | POA: Diagnosis not present

## 2022-09-05 DEATH — deceased
